# Patient Record
Sex: Male | Born: 1950 | Race: White | Hispanic: No | Marital: Married | Smoking: Never smoker
Health system: Southern US, Community
[De-identification: ages and names within clinical notes are randomized; demographics above are authoritative.]

## PROBLEM LIST (undated history)

## (undated) DIAGNOSIS — Z87442 Personal history of urinary calculi: Secondary | ICD-10-CM

## (undated) DIAGNOSIS — I4891 Unspecified atrial fibrillation: Secondary | ICD-10-CM

## (undated) DIAGNOSIS — T884XXA Failed or difficult intubation, initial encounter: Secondary | ICD-10-CM

## (undated) DIAGNOSIS — E785 Hyperlipidemia, unspecified: Secondary | ICD-10-CM

## (undated) DIAGNOSIS — K635 Polyp of colon: Secondary | ICD-10-CM

## (undated) DIAGNOSIS — I509 Heart failure, unspecified: Secondary | ICD-10-CM

## (undated) DIAGNOSIS — I442 Atrioventricular block, complete: Secondary | ICD-10-CM

## (undated) DIAGNOSIS — H269 Unspecified cataract: Secondary | ICD-10-CM

## (undated) DIAGNOSIS — I219 Acute myocardial infarction, unspecified: Secondary | ICD-10-CM

## (undated) DIAGNOSIS — M1990 Unspecified osteoarthritis, unspecified site: Secondary | ICD-10-CM

## (undated) DIAGNOSIS — I2510 Atherosclerotic heart disease of native coronary artery without angina pectoris: Secondary | ICD-10-CM

## (undated) DIAGNOSIS — I255 Ischemic cardiomyopathy: Secondary | ICD-10-CM

## (undated) DIAGNOSIS — I9789 Other postprocedural complications and disorders of the circulatory system, not elsewhere classified: Secondary | ICD-10-CM

## (undated) DIAGNOSIS — Z951 Presence of aortocoronary bypass graft: Secondary | ICD-10-CM

## (undated) DIAGNOSIS — I10 Essential (primary) hypertension: Secondary | ICD-10-CM

## (undated) DIAGNOSIS — L570 Actinic keratosis: Secondary | ICD-10-CM

## (undated) DIAGNOSIS — N200 Calculus of kidney: Secondary | ICD-10-CM

## (undated) HISTORY — PX: NECK SURGERY: SHX720

## (undated) HISTORY — PX: KNEE SURGERY: SHX244

## (undated) HISTORY — DX: Heart failure, unspecified: I50.9

## (undated) HISTORY — DX: Unspecified atrial fibrillation: I48.91

## (undated) HISTORY — DX: Essential (primary) hypertension: I10

## (undated) HISTORY — DX: Unspecified cataract: H26.9

## (undated) HISTORY — DX: Hyperlipidemia, unspecified: E78.5

## (undated) HISTORY — PX: EYE SURGERY: SHX253

## (undated) HISTORY — DX: Other postprocedural complications and disorders of the circulatory system, not elsewhere classified: I97.89

## (undated) HISTORY — PX: CATARACT EXTRACTION, BILATERAL: SHX1313

## (undated) HISTORY — DX: Ischemic cardiomyopathy: I25.5

## (undated) HISTORY — DX: Polyp of colon: K63.5

## (undated) HISTORY — DX: Atherosclerotic heart disease of native coronary artery without angina pectoris: I25.10

## (undated) HISTORY — DX: Atrioventricular block, complete: I44.2

## (undated) HISTORY — PX: HERNIA REPAIR: SHX51

## (undated) HISTORY — PX: JOINT REPLACEMENT: SHX530

## (undated) HISTORY — DX: Unspecified osteoarthritis, unspecified site: M19.90

## (undated) HISTORY — DX: Actinic keratosis: L57.0

---

## 2003-09-02 ENCOUNTER — Ambulatory Visit (HOSPITAL_COMMUNITY): Admission: RE | Admit: 2003-09-02 | Discharge: 2003-09-03 | Payer: Self-pay | Admitting: Neurological Surgery

## 2003-10-13 ENCOUNTER — Encounter: Admission: RE | Admit: 2003-10-13 | Discharge: 2003-10-13 | Payer: Self-pay | Admitting: Neurological Surgery

## 2011-01-13 ENCOUNTER — Ambulatory Visit: Payer: Self-pay | Admitting: Gastroenterology

## 2011-01-13 LAB — HM COLONOSCOPY

## 2011-01-18 LAB — PATHOLOGY REPORT

## 2012-04-25 DIAGNOSIS — R3915 Urgency of urination: Secondary | ICD-10-CM | POA: Insufficient documentation

## 2012-04-25 DIAGNOSIS — R361 Hematospermia: Secondary | ICD-10-CM | POA: Insufficient documentation

## 2012-04-25 DIAGNOSIS — R339 Retention of urine, unspecified: Secondary | ICD-10-CM | POA: Insufficient documentation

## 2012-04-25 DIAGNOSIS — R39198 Other difficulties with micturition: Secondary | ICD-10-CM | POA: Insufficient documentation

## 2012-04-25 DIAGNOSIS — N411 Chronic prostatitis: Secondary | ICD-10-CM | POA: Insufficient documentation

## 2012-04-25 DIAGNOSIS — N401 Enlarged prostate with lower urinary tract symptoms: Secondary | ICD-10-CM | POA: Insufficient documentation

## 2013-06-06 ENCOUNTER — Ambulatory Visit: Payer: Self-pay | Admitting: Family Medicine

## 2013-11-14 ENCOUNTER — Ambulatory Visit: Payer: Self-pay | Admitting: Family Medicine

## 2014-01-19 ENCOUNTER — Ambulatory Visit: Payer: Self-pay | Admitting: Specialist

## 2014-01-19 LAB — CBC WITH DIFFERENTIAL/PLATELET
BASOS PCT: 0.9 %
Basophil #: 0.1 10*3/uL (ref 0.0–0.1)
EOS ABS: 0.3 10*3/uL (ref 0.0–0.7)
EOS PCT: 3.6 %
HCT: 43.1 % (ref 40.0–52.0)
HGB: 14.6 g/dL (ref 13.0–18.0)
LYMPHS PCT: 20.4 %
Lymphocyte #: 1.8 10*3/uL (ref 1.0–3.6)
MCH: 27.6 pg (ref 26.0–34.0)
MCHC: 33.9 g/dL (ref 32.0–36.0)
MCV: 81 fL (ref 80–100)
MONOS PCT: 7.7 %
Monocyte #: 0.7 x10 3/mm (ref 0.2–1.0)
NEUTROS PCT: 67.4 %
Neutrophil #: 5.9 10*3/uL (ref 1.4–6.5)
PLATELETS: 211 10*3/uL (ref 150–440)
RBC: 5.29 10*6/uL (ref 4.40–5.90)
RDW: 14.2 % (ref 11.5–14.5)
WBC: 8.8 10*3/uL (ref 3.8–10.6)

## 2014-01-19 LAB — URINALYSIS, COMPLETE
BACTERIA: NONE SEEN
BILIRUBIN, UR: NEGATIVE
BLOOD: NEGATIVE
Glucose,UR: NEGATIVE mg/dL (ref 0–75)
KETONE: NEGATIVE
Leukocyte Esterase: NEGATIVE
Nitrite: NEGATIVE
Ph: 5 (ref 4.5–8.0)
Protein: NEGATIVE
RBC,UR: NONE SEEN /HPF (ref 0–5)
SPECIFIC GRAVITY: 1.024 (ref 1.003–1.030)

## 2014-01-19 LAB — BASIC METABOLIC PANEL
Anion Gap: 6 — ABNORMAL LOW (ref 7–16)
BUN: 15 mg/dL (ref 7–18)
CO2: 27 mmol/L (ref 21–32)
CREATININE: 0.85 mg/dL (ref 0.60–1.30)
Calcium, Total: 8.7 mg/dL (ref 8.5–10.1)
Chloride: 106 mmol/L (ref 98–107)
Glucose: 130 mg/dL — ABNORMAL HIGH (ref 65–99)
OSMOLALITY: 280 (ref 275–301)
POTASSIUM: 4.4 mmol/L (ref 3.5–5.1)
Sodium: 139 mmol/L (ref 136–145)

## 2014-01-19 LAB — MRSA PCR SCREENING

## 2014-01-19 LAB — APTT: ACTIVATED PTT: 27.6 s (ref 23.6–35.9)

## 2014-01-19 LAB — PROTIME-INR
INR: 0.9
Prothrombin Time: 12.4 secs (ref 11.5–14.7)

## 2014-02-04 ENCOUNTER — Inpatient Hospital Stay: Payer: Self-pay | Admitting: Specialist

## 2014-02-04 HISTORY — PX: OTHER SURGICAL HISTORY: SHX169

## 2014-02-05 LAB — CBC WITH DIFFERENTIAL/PLATELET
BASOS ABS: 0.1 10*3/uL (ref 0.0–0.1)
Basophil %: 0.6 %
Eosinophil #: 0 10*3/uL (ref 0.0–0.7)
Eosinophil %: 0.1 %
HCT: 37 % — ABNORMAL LOW (ref 40.0–52.0)
HGB: 12.3 g/dL — AB (ref 13.0–18.0)
LYMPHS ABS: 1.4 10*3/uL (ref 1.0–3.6)
Lymphocyte %: 5.8 %
MCH: 27.2 pg (ref 26.0–34.0)
MCHC: 33.2 g/dL (ref 32.0–36.0)
MCV: 82 fL (ref 80–100)
Monocyte #: 1.6 x10 3/mm — ABNORMAL HIGH (ref 0.2–1.0)
Monocyte %: 6.5 %
NEUTROS PCT: 87 %
Neutrophil #: 20.7 10*3/uL — ABNORMAL HIGH (ref 1.4–6.5)
Platelet: 195 10*3/uL (ref 150–440)
RBC: 4.51 10*6/uL (ref 4.40–5.90)
RDW: 14.5 % (ref 11.5–14.5)
WBC: 23.7 10*3/uL — AB (ref 3.8–10.6)

## 2014-02-05 LAB — BASIC METABOLIC PANEL
ANION GAP: 9 (ref 7–16)
BUN: 15 mg/dL (ref 7–18)
CHLORIDE: 101 mmol/L (ref 98–107)
Calcium, Total: 7.9 mg/dL — ABNORMAL LOW (ref 8.5–10.1)
Co2: 26 mmol/L (ref 21–32)
Creatinine: 1.06 mg/dL (ref 0.60–1.30)
EGFR (African American): >60
GLUCOSE: 136 mg/dL — AB (ref 65–99)
OSMOLALITY: 275 (ref 275–301)
POTASSIUM: 4.3 mmol/L (ref 3.5–5.1)
Sodium: 136 mmol/L (ref 136–145)

## 2014-02-06 LAB — HEMOGLOBIN: HGB: 11.8 g/dL — AB (ref 13.0–18.0)

## 2014-02-10 LAB — PATHOLOGY REPORT

## 2014-11-28 NOTE — Op Note (Signed)
PATIENT NAME:  [name], [name] MR#:  [id number] DATE OF BIRTH:  1951-01-24  DATE OF PROCEDURE:  02/04/2014  PREOPERATIVE DIAGNOSIS: Advanced osteoarthritis, right hip.   POSTOPERATIVE DIAGNOSIS:  Advanced osteoarthritis, right hip.  PROCEDURE PERFORMED: Cementless [place] AML right total hip replacement (13.5 mm femoral stem, 56 mm Porocoated series 300 cup, +4/10 mm build-up polyethylene liner, 36 mm head with a+ 1.5 mm neck length).   SURGEON: Park Breed, M.D.   ASSISTANT: Dr. Mack Guise.   ANESTHESIA: Spinal plus general endotracheal.   COMPLICATIONS:  None.  DRAINS: Two Autovacs.   ESTIMATED BLOOD LOSS: 300 mL.  REPLACED: None.   DESCRIPTION OF PROCEDURE: The patient was brought to the Operating Room where he underwent satisfactory general spinal anesthesia with narcotics and then general endotracheal anesthesia. He was turned in the left lateral decubitus position and padded appropriately. The right hip was prepped and draped in sterile fashion. A posterolateral incision was made and dissection carried out sharply through subcutaneous tissue. The fascia was incised. Electrocautery was used for hemostasis. The Charnley retractor was inserted. Soft tissues were released from the posterior aspect of the hip and the short external rotators were tagged. A pin was placed in the acetabulum, marked leg length, and then rotated out of the way. The capsule was incised and tagged. The leg was internally rotated and femoral head dislocated. The head was amputated about a fingerbreadth above the lesser trochanter with an oscillating saw. The head measured about 51 and 52 mm in size. The femoral canal was then opened with a Charnley canal finder, and the canal gradually reamed to 13 mm. He had thick hard bone and did not feel we needed to go any further than this. The femoral shaft was then retracted anteriorly and acetabulum exposed. The labrum was removed. The acetabulum was then sequentially  reamed starting about 48 mm and medializing. It was reamed up to 55 mm.  A 56 mm trial cup was inserted, was seated well, and had good orientation. This was removed and after irrigation, the 56 mm series 300 cup with 3 spikes was inserted and about 45 degrees of abduction and 25 degrees of anteversion. The trial liner was inserted with +4 build-up and 10 mm lip posterior superiorly. The femoral canal was then rasped up to 13.5 mm. The calcar rasp was used to smooth the calcar. Trial reduction was then carried out using a 36 mm head with +1.5 neck length. Leg lengths were excellent. The pin over the greater trochanter showed no change in length and clinically it was equal. The hip was dislocated. Trials were removed. The hole eliminator was placed in the acetabular cup and a polyethylene liner with a +4 polyethylene liner with 10 mm build-up was placed with the build-up posterior superiorly. The femoral broaches have been removed. The canal was irrigated. A 13.5 mm narrow stem was inserted and seated fully. The 36 mm head with a 1.5 neck length was attached and the hip was reduced and was quite stable in all directions. The leg lengths were excellent again. The acetabular pin was removed. After further irrigation, the capsule was closed with #2-0 Tycron. The short external rotators were closed with #2 Tycron. The 2 Autovacs were inserted. The deep fascia was then closed with #2 Quill over 1 drain, and the subcutaneous tissue was closed with 0 Quill over another drain. The skin was closed with staples. Dry sterile dressings were applied. The Autovac was activated. TENS pads were applied. The patient was  transferred onto his back and leg lengths were again equal and the hip was stable. He was transferred to his hospital bed and taken to recovery in good condition.    ____________________________ Park Breed, MD hem:ts D: 02/04/2014 10:48:29 ET T: 02/04/2014 11:31:06 ET JOB#: [id number]  cc: Park Breed,  MD, <Dictator> Park Breed MD ELECTRONICALLY SIGNED 02/04/2014 12:08

## 2014-11-28 NOTE — Discharge Summary (Signed)
PATIENT NAME:  [name], [name] MR#:  [id number] DATE OF BIRTH:  August 15, 1950  DATE OF ADMISSION:  02/04/2014 DATE OF DISCHARGE:  02/07/2014  PRIMARY CARE DOCTOR: Kirstie Peri. Caryn Section, MD  FINAL DIAGNOSES: 1. Advanced osteoarthritis, right hip.  2. High cholesterol.  OPERATIONS: On 02/04/2014: Cement-less AML to right total hip replacement.   COMPLICATIONS: None.   CONSULTATIONS: None.   DISCHARGE MEDICATIONS: Atorvastatin 20 mg daily, enteric-coated aspirin 1 p.o. b.i.d. for 6 weeks, gabapentin 400 mg b.i.d.,  Norco 7.5/325 mg every 6 hours p.r.n. pain.   HISTORY OF PRESENT ILLNESS: The patient is a 64 year old male with advanced osteoarthritis of the right hip. It gave him significant problems with work and home activities. 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   PAST MEDICAL HISTORY/ILLNESSES: High cholesterol.   PRIOR OPERATIONS: Lumbar fusion.    ALLERGIES: None.   REVIEW OF SYSTEMS: Unremarkable.   FAMILY HISTORY: Unremarkable.   SOCIAL HISTORY: The patient does not smoke or drink. He lives with his wife. He did test positive for MRSA preoperatively.   PHYSICAL EXAMINATION:  The patient is healthy in all other aspects.  The right hip showed decreased pain with motion; lacking 15 degrees of neutral and externally rotated another 15 degrees. He had no internal rotation. He flexed to 95 degrees. His leg lengths were equal. Neurovascular status was good distally.   LABORATORY DATA: On admission was satisfactory.   HOSPITAL COURSE: On 02/04/2014, the patient underwent cementless AML right total hip replacement. Postoperatively, he did well with minimal blood loss. Hemoglobin was 11.8 on his second postoperative day. He was ambulated and made good progress. He was discharged on 02/07/2014  with home physical therapy. He will be partial weightbearing on walker and return to my  office in 2 weeks for exam and x-ray.    ____________________________ Park Breed, MD hem:lm D: 02/07/2014 13:27:15 ET T: 02/08/2014 01:26:57 ET JOB#: [id number]  cc: Park Breed, MD, <Dictator> [place] Caryn Section, [place] MD ELECTRONICALLY SIGNED 02/08/2014 11:08

## 2014-11-28 NOTE — H&P (Signed)
   Subjective/Chief Complaint Right hip pain   History of Present Illness 64 year old male has advanced osteoarthritis of the right hip which is giving him significant problems with work and home activities.  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  Interferes with home activities and sleep.  Has taken meloxicam with minimal relief.  Has been evaluated for back pain as well but the hip is the big problem.  He wishes to proceed with total hip replacement.  Risks and benefits of surgery were discussed at length including but not limited to infection, non union, nerve or blood vessed damage, non union, need for repeat surgery, blood clots and lung emboli, and death.  X-rays show severe joint loss, sclerosis, and cyst formation.  Also has sacroiliac osteoarthritis and lumbar osteoarthritis.   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  ALLERGIES:  No Known Allergies:   HOME MEDICATIONS: Medication Instructions Status  meloxicam 15 mg oral tablet 1 tab(s) orally once a day Active  atorvastatin 20 mg oral tablet 1 tab(s) orally once a day (at bedtime) Active   Family and Social History:  Family History Non-Contributory   Social History negative tobacco, negative ETOH   Place of Living Home   Review of Systems:  Fever/Chills No   Cough No   Sputum No   Abdominal Pain No   Chest Pain No   Physical Exam:  GEN well developed, well nourished, no acute distress   HEENT pink conjunctivae   NECK supple   RESP normal resp effort   CARD regular rate   ABD denies tenderness   LYMPH negative neck   EXTR negative edema, Right hip range of motion severely decreased from -15 to 30*  external rotation, no internal rotation. flexion to 95*.  Leg lengths equal.  circulation/sensation/motor function good  distally.   SKIN normal to palpation   NEURO motor/sensory function intact   PSYCH alert, A+O to time, place, person    Assessment/Admission Diagnosis Advanced osteoarthritis right hip + MRSA scan   Plan Right total hip replacement with Vancomycin coverage.   Electronic Signatures: Park Breed (MD)  (Signed 30-Jun-15 14:09)  Authored: CHIEF COMPLAINT and HISTORY, PAST MEDICAL/SURGIAL HISTORY, ALLERGIES, HOME MEDICATIONS, FAMILY AND SOCIAL HISTORY, REVIEW OF SYSTEMS, PHYSICAL EXAM, ASSESSMENT AND PLAN   Last Updated: 30-Jun-15 14:09 by Park Breed (MD)

## 2014-12-31 LAB — LIPID PANEL
Cholesterol: 207 mg/dL — AB (ref 0–200)
HDL: 23 mg/dL — AB (ref 35–70)
LDL CALC: 104 mg/dL
Triglycerides: 400 mg/dL — AB (ref 40–160)

## 2014-12-31 LAB — BASIC METABOLIC PANEL
BUN: 11 mg/dL (ref 4–21)
Creatinine: 1.1 mg/dL (ref 0.6–1.3)
GLUCOSE: 119 mg/dL
Potassium: 4.5 mmol/L (ref 3.4–5.3)
Sodium: 144 mmol/L (ref 137–147)

## 2014-12-31 LAB — CBC AND DIFFERENTIAL
HCT: 47 % (ref 41–53)
Hemoglobin: 15.5 g/dL (ref 13.5–17.5)
Platelets: 191 10*3/uL (ref 150–399)
WBC: 6.8 10*3/mL

## 2014-12-31 LAB — TSH: TSH: 1.27 u[IU]/mL (ref 0.41–5.90)

## 2014-12-31 LAB — HEPATIC FUNCTION PANEL
ALT: 22 U/L (ref 10–40)
AST: 19 U/L (ref 14–40)

## 2015-01-01 ENCOUNTER — Emergency Department
Admission: EM | Admit: 2015-01-01 | Discharge: 2015-01-02 | Disposition: A | Discharge status: Home / Self Care | Payer: 59 | Attending: Emergency Medicine | Admitting: Emergency Medicine

## 2015-01-01 ENCOUNTER — Other Ambulatory Visit: Payer: Self-pay

## 2015-01-01 DIAGNOSIS — N200 Calculus of kidney: Secondary | ICD-10-CM | POA: Diagnosis not present

## 2015-01-01 DIAGNOSIS — R109 Unspecified abdominal pain: Secondary | ICD-10-CM | POA: Diagnosis present

## 2015-01-01 LAB — CBC WITH DIFFERENTIAL/PLATELET
Basophils Absolute: 0.1 10*3/uL (ref 0–0.1)
Basophils Relative: 1 %
EOS ABS: 0 10*3/uL (ref 0–0.7)
Eosinophils Relative: 0 %
HEMATOCRIT: 44.4 % (ref 40.0–52.0)
Hemoglobin: 15.3 g/dL (ref 13.0–18.0)
LYMPHS PCT: 12 %
Lymphs Abs: 1 10*3/uL (ref 1.0–3.6)
MCH: 26.9 pg (ref 26.0–34.0)
MCHC: 34.5 g/dL (ref 32.0–36.0)
MCV: 78 fL — ABNORMAL LOW (ref 80.0–100.0)
Monocytes Absolute: 0.7 10*3/uL (ref 0.2–1.0)
Monocytes Relative: 9 %
NEUTROS ABS: 6 10*3/uL (ref 1.4–6.5)
NEUTROS PCT: 78 %
PLATELETS: 144 10*3/uL — AB (ref 150–440)
RBC: 5.69 MIL/uL (ref 4.40–5.90)
RDW: 15.6 % — AB (ref 11.5–14.5)
WBC: 7.8 10*3/uL (ref 3.8–10.6)

## 2015-01-01 LAB — URINALYSIS COMPLETE WITH MICROSCOPIC (ARMC ONLY)
Bacteria, UA: NONE SEEN
Bilirubin Urine: NEGATIVE
GLUCOSE, UA: NEGATIVE mg/dL
KETONES UR: NEGATIVE mg/dL
Leukocytes, UA: NEGATIVE
NITRITE: NEGATIVE
PH: 6 (ref 5.0–8.0)
PROTEIN: NEGATIVE mg/dL
SQUAMOUS EPITHELIAL / LPF: NONE SEEN
Specific Gravity, Urine: 1.018 (ref 1.005–1.030)

## 2015-01-01 LAB — COMPREHENSIVE METABOLIC PANEL
ALBUMIN: 4 g/dL (ref 3.5–5.0)
ALT: 23 U/L (ref 17–63)
ANION GAP: 9 (ref 5–15)
AST: 27 U/L (ref 15–41)
Alkaline Phosphatase: 90 U/L (ref 38–126)
BUN: 12 mg/dL (ref 6–20)
CHLORIDE: 101 mmol/L (ref 101–111)
CO2: 22 mmol/L (ref 22–32)
CREATININE: 1.1 mg/dL (ref 0.61–1.24)
Calcium: 8.5 mg/dL — ABNORMAL LOW (ref 8.9–10.3)
GFR calc Af Amer: >60 mL/min (ref >60)
GFR calc non Af Amer: >60 mL/min (ref >60)
GLUCOSE: 169 mg/dL — AB (ref 65–99)
POTASSIUM: 3.6 mmol/L (ref 3.5–5.1)
SODIUM: 132 mmol/L — AB (ref 135–145)
TOTAL PROTEIN: 7.1 g/dL (ref 6.5–8.1)
Total Bilirubin: 1.5 mg/dL — ABNORMAL HIGH (ref 0.3–1.2)

## 2015-01-01 LAB — TROPONIN I

## 2015-01-01 MED ORDER — SODIUM CHLORIDE 0.9 % IV BOLUS (SEPSIS)
1000.0000 mL | Freq: Once | INTRAVENOUS | Status: AC
  Administered 2015-01-01: 1000 mL via INTRAVENOUS

## 2015-01-01 MED ORDER — ONDANSETRON HCL 4 MG/2ML IJ SOLN
INTRAMUSCULAR | Status: AC
  Administered 2015-01-01: 4 mg via INTRAVENOUS
  Filled 2015-01-01: qty 2

## 2015-01-01 MED ORDER — ONDANSETRON HCL 4 MG/2ML IJ SOLN
4.0000 mg | Freq: Once | INTRAMUSCULAR | Status: AC
  Administered 2015-01-01: 4 mg via INTRAVENOUS

## 2015-01-01 NOTE — ED Notes (Signed)
Pt presents to ER alert and in NAD. Pt states abd pain and back pain sicne last night. Pt reports n/v. Pt denies urinary s/s.

## 2015-01-02 ENCOUNTER — Emergency Department: Payer: 59

## 2015-01-02 DIAGNOSIS — N200 Calculus of kidney: Secondary | ICD-10-CM | POA: Diagnosis not present

## 2015-01-02 MED ORDER — ONDANSETRON HCL 4 MG PO TABS: 4.0000 mg | ORAL_TABLET | Freq: Three times a day (TID) | ORAL | Status: DC | PRN

## 2015-01-02 MED ORDER — TAMSULOSIN HCL 0.4 MG PO CAPS: 0.4000 mg | ORAL_CAPSULE | Freq: Every day | ORAL | Status: DC

## 2015-01-02 MED ORDER — CIPROFLOXACIN HCL 500 MG PO TABS: 500.0000 mg | ORAL_TABLET | Freq: Two times a day (BID) | ORAL | Status: DC

## 2015-01-02 MED ORDER — MORPHINE SULFATE 4 MG/ML IJ SOLN
4.0000 mg | Freq: Once | INTRAMUSCULAR | Status: AC
  Administered 2015-01-02: 4 mg via INTRAVENOUS

## 2015-01-02 MED ORDER — TAMSULOSIN HCL 0.4 MG PO CAPS: 0.4000 mg | ORAL_CAPSULE | Freq: Once | ORAL | Status: DC

## 2015-01-02 MED ORDER — ONDANSETRON HCL 4 MG/2ML IJ SOLN
INTRAMUSCULAR | Status: AC
  Filled 2015-01-02: qty 2

## 2015-01-02 MED ORDER — OXYCODONE-ACETAMINOPHEN 5-325 MG PO TABS: 1.0000 | ORAL_TABLET | Freq: Four times a day (QID) | ORAL | Status: DC | PRN

## 2015-01-02 MED ORDER — ONDANSETRON HCL 4 MG/2ML IJ SOLN
4.0000 mg | Freq: Once | INTRAMUSCULAR | Status: AC
  Administered 2015-01-02: 4 mg via INTRAVENOUS

## 2015-01-02 MED ORDER — MORPHINE SULFATE 4 MG/ML IJ SOLN
INTRAMUSCULAR | Status: AC
  Filled 2015-01-02: qty 1

## 2015-01-02 MED ORDER — SODIUM CHLORIDE 0.9 % IV BOLUS (SEPSIS)
1000.0000 mL | Freq: Once | INTRAVENOUS | Status: AC
  Administered 2015-01-02: 1000 mL via INTRAVENOUS

## 2015-01-02 NOTE — Discharge Instructions (Signed)
Kidney Stones Take her CAT scan report with you to your follow-up appointment with either your primary care doctor or Dr. Saralyn Pilar.  You were found to have calcifications in the coronary arteries.  Use the urine strainer to try to catch any kidney stones. If you're able to catch any stones take them with you to your follow-up appointment with the urologist. Kidney stones (urolithiasis) are solid masses that form inside your kidneys. The intense pain is caused by the stone moving through the kidney, ureter, bladder, and urethra (urinary tract). When the stone moves, the ureter starts to spasm around the stone. The stone is usually passed in your pee (urine).  HOME CARE  Drink enough fluids to keep your pee clear or pale yellow. This helps to get the stone out.  Strain all pee through the provided strainer. Do not pee without peeing through the strainer, not even once. If you pee the stone out, catch it in the strainer. The stone may be as small as a grain of salt. Take this to your doctor. This will help your doctor figure out what you can do to try to prevent more kidney stones.  Only take medicine as told by your doctor.  Follow up with your doctor as told.  Get follow-up X-rays as told by your doctor. GET HELP IF: You have pain that gets worse even if you have been taking pain medicine. GET HELP RIGHT AWAY IF:   Your pain does not get better with medicine.  You have a fever or shaking chills.  Your pain increases and gets worse over 18 hours.  You have new belly (abdominal) pain.  You feel faint or pass out.  You are unable to pee. MAKE SURE YOU:   Understand these instructions.  Will watch your condition.  Will get help right away if you are not doing well or get worse. Document Released: 01/10/2008 Document Revised: 03/26/2013 Document Reviewed: 12/25/2012 [facility] Patient Information 2015 [place], [place]. This information is not intended to replace advice given to you by  your health care provider. Make sure you discuss any questions you have with your health care provider.

## 2015-01-02 NOTE — ED Notes (Signed)
Patient transported to CT 

## 2015-01-02 NOTE — ED Provider Notes (Signed)
[facility] Emergency Department Provider Note  ____________________________________________  Time seen: Approximately 12:11 AM  I have reviewed the triage vital signs and the nursing notes.   HISTORY  Chief Complaint Abdominal Pain    HPI ANTWIONE PICOTTE is a 64 y.o. male without any chronic medical issues who presents with 1 day of left lower lumbar pain. Patient said the pain started last night suddenly and is in and out of 10. It is a dull ache but he just doesn't seem to be able to get comfortable. He says he also had blood in his urine about one week ago. He has no history of kidney stones and no history to his family. He has no ongoing medical issues. He does not have any dysuria at this time. He has had several episodes of nausea and vomiting. However, no blood in his vomitus. No diarrhea. The pain does not radiate. He has had no loss of bowel or bladder continence. He is not having any abdominal pain at this time.   No past medical history on file.  There are no active problems to display for this patient.   No past surgical history on file.  No current outpatient prescriptions on file.  Allergies Review of patient's allergies indicates no known allergies.  No family history on file.  Social History History  Substance Use Topics  . Smoking status: Not on file  . Smokeless tobacco: Not on file  . Alcohol Use: Not on file    Review of Systems Constitutional: No fever/chills Eyes: No visual changes. ENT: No sore throat. Cardiovascular: Denies chest pain. Respiratory: Denies shortness of breath. Gastrointestinal: No abdominal pain.  No nausea, no vomiting.  No diarrhea.  No constipation. Genitourinary: Negative for dysuria. Musculoskeletal: Left lumbar pain. Skin: Negative for rash. Neurological: Negative for headaches, focal weakness or numbness.  10-point ROS otherwise  negative.  ____________________________________________   PHYSICAL EXAM:  VITAL SIGNS: ED Triage Vitals  Enc Vitals Group     BP 01/01/15 1953 181/93 mmHg     Pulse Rate 01/01/15 1953 79     Resp 01/01/15 1953 18     Temp 01/01/15 1953 98.1 F (36.7 C)     Temp Source 01/01/15 1953 Oral     SpO2 01/01/15 1953 97 %     Weight 01/01/15 1953 204 lb (92.534 kg)     Height 01/01/15 1953 5\' 6"  (1.676 m)     Head Cir --      Peak Flow --      Pain Score 01/01/15 1954 8     Pain Loc --      Pain Edu? --      Excl. in [place]? --     Constitutional: Alert and oriented. Appears in mild discomfort. Eyes: Conjunctivae are normal. PERRL. EOMI. Head: Atraumatic. Nose: No congestion/rhinnorhea. Mouth/Throat: Mucous membranes are moist.  Oropharynx non-erythematous. Neck: No stridor.   Cardiovascular: Normal rate, regular rhythm. Grossly normal heart sounds.  Good peripheral circulation. Respiratory: Normal respiratory effort.  No retractions. Lungs CTAB. Gastrointestinal: Soft and nontender. No distention. No abdominal bruits. No CVA tenderness. Musculoskeletal: No lower extremity tenderness nor edema.  No joint effusions. Neurologic:  Normal speech and language. No gross focal neurologic deficits are appreciated. Speech is normal. No gait instability. Skin:  Skin is warm, dry and intact. No rash noted. Psychiatric: Mood and affect are normal. Speech and behavior are normal.  ____________________________________________   LABS (all labs ordered are listed, but only abnormal  results are displayed)  Labs Reviewed  COMPREHENSIVE METABOLIC PANEL - Abnormal; Notable for the following:    Sodium 132 (*)    Glucose, Bld 169 (*)    Calcium 8.5 (*)    Total Bilirubin 1.5 (*)    All other components within normal limits  URINALYSIS COMPLETEWITH MICROSCOPIC ([facility] ONLY) - Abnormal; Notable for the following:    Color, Urine YELLOW (*)    APPearance CLEAR (*)    Hgb urine dipstick 3+ (*)     All other components within normal limits  CBC WITH DIFFERENTIAL/PLATELET - Abnormal; Notable for the following:    MCV 78.0 (*)    RDW 15.6 (*)    Platelets 144 (*)    All other components within normal limits  TROPONIN I   ____________________________________________  EKG  ED ECG REPORT I, Doran Stabler, the attending physician, personally viewed and interpreted this ECG.   Date: 01/02/2015  EKG Time: 2003  Rate: 72  Rhythm: Normal sinus rhythm with first-degree AV block.  Axis: Normal axis  Intervals:none  ST&T Change: No ST elevations or depressions. No STEMI. T wave inversions in 1 and aVL. No previous for comparison.  ____________________________________________  RADIOLOGY  3 mm left ureteral stone at the pelvic brim with mild hydronephrosis. Also with extensive coronary atherosclerosis. Also with mild perinephric stranding on the left. ____________________________________________   PROCEDURES   ____________________________________________   INITIAL IMPRESSION / ASSESSMENT AND PLAN / ED COURSE  Pertinent labs & imaging results that were available during my care of the patient were reviewed by me and considered in my medical decision making (see chart for details).  Suspect kidney stone. We'll order CAT scan of the abdomen and pelvis without contrast.  ----------------------------------------- 1:23 AM on 01/02/2015 -----------------------------------------  Patient's pain is 2 out of 10 after IV pain meds. We'll discharge home with Flomax and Percocet as well as a urine strainer. Given copy of CT report. We'll follow up with either primary care doctor Dr. Tomasita Crumble shows, which is why she is, for the atherosclerosis. The patient does not have any chest pain or shortness of breath this time. This represents an incidental finding but will need further investigation. ____________________________________________   FINAL CLINICAL IMPRESSION(S) / ED  DIAGNOSES  Left back and flank pain. Left-sided ureteral stone. Hematuria. Acute common initial visit.    Orbie Pyo, MD 01/02/15 0130

## 2015-01-03 ENCOUNTER — Encounter: Payer: Self-pay | Admitting: Emergency Medicine

## 2015-01-03 ENCOUNTER — Emergency Department: Payer: 59

## 2015-01-03 ENCOUNTER — Other Ambulatory Visit: Payer: Self-pay

## 2015-01-03 ENCOUNTER — Emergency Department
Admission: EM | Admit: 2015-01-03 | Discharge: 2015-01-03 | Disposition: A | Discharge status: Home / Self Care | Payer: 59 | Attending: Student | Admitting: Student

## 2015-01-03 DIAGNOSIS — Z79899 Other long term (current) drug therapy: Secondary | ICD-10-CM | POA: Diagnosis not present

## 2015-01-03 DIAGNOSIS — Z87442 Personal history of urinary calculi: Secondary | ICD-10-CM | POA: Diagnosis not present

## 2015-01-03 DIAGNOSIS — N201 Calculus of ureter: Secondary | ICD-10-CM

## 2015-01-03 DIAGNOSIS — Z792 Long term (current) use of antibiotics: Secondary | ICD-10-CM | POA: Diagnosis not present

## 2015-01-03 DIAGNOSIS — R52 Pain, unspecified: Secondary | ICD-10-CM

## 2015-01-03 DIAGNOSIS — R112 Nausea with vomiting, unspecified: Secondary | ICD-10-CM

## 2015-01-03 DIAGNOSIS — R109 Unspecified abdominal pain: Secondary | ICD-10-CM | POA: Diagnosis present

## 2015-01-03 HISTORY — DX: Calculus of kidney: N20.0

## 2015-01-03 LAB — CBC WITH DIFFERENTIAL/PLATELET
Basophils Absolute: 0 10*3/uL (ref 0–0.1)
Basophils Relative: 0 %
EOS ABS: 0 10*3/uL (ref 0–0.7)
EOS PCT: 0 %
HEMATOCRIT: 42.3 % (ref 40.0–52.0)
HEMOGLOBIN: 14.4 g/dL (ref 13.0–18.0)
LYMPHS PCT: 7 %
Lymphs Abs: 0.5 10*3/uL — ABNORMAL LOW (ref 1.0–3.6)
MCH: 26.5 pg (ref 26.0–34.0)
MCHC: 34 g/dL (ref 32.0–36.0)
MCV: 78.1 fL — AB (ref 80.0–100.0)
Monocytes Absolute: 0.6 10*3/uL (ref 0.2–1.0)
Monocytes Relative: 10 %
Neutro Abs: 5.4 10*3/uL (ref 1.4–6.5)
Neutrophils Relative %: 83 %
Platelets: 117 10*3/uL — ABNORMAL LOW (ref 150–440)
RBC: 5.42 MIL/uL (ref 4.40–5.90)
RDW: 15.4 % — ABNORMAL HIGH (ref 11.5–14.5)
WBC: 6.5 10*3/uL (ref 3.8–10.6)

## 2015-01-03 LAB — URINALYSIS COMPLETE WITH MICROSCOPIC (ARMC ONLY)
Bacteria, UA: NONE SEEN
Bilirubin Urine: NEGATIVE
Glucose, UA: NEGATIVE mg/dL
Ketones, ur: NEGATIVE mg/dL
LEUKOCYTES UA: NEGATIVE
NITRITE: NEGATIVE
Protein, ur: 30 mg/dL — AB
SPECIFIC GRAVITY, URINE: 1.026 (ref 1.005–1.030)
Squamous Epithelial / LPF: NONE SEEN
pH: 5 (ref 5.0–8.0)

## 2015-01-03 LAB — COMPREHENSIVE METABOLIC PANEL
ALT: 27 U/L (ref 17–63)
ANION GAP: 10 (ref 5–15)
AST: 31 U/L (ref 15–41)
Albumin: 3.6 g/dL (ref 3.5–5.0)
Alkaline Phosphatase: 76 U/L (ref 38–126)
BUN: 18 mg/dL (ref 6–20)
CHLORIDE: 98 mmol/L — AB (ref 101–111)
CO2: 25 mmol/L (ref 22–32)
Calcium: 8.4 mg/dL — ABNORMAL LOW (ref 8.9–10.3)
Creatinine, Ser: 1.78 mg/dL — ABNORMAL HIGH (ref 0.61–1.24)
GFR calc Af Amer: 44 mL/min — ABNORMAL LOW (ref >60)
GFR calc non Af Amer: 38 mL/min — ABNORMAL LOW (ref >60)
GLUCOSE: 160 mg/dL — AB (ref 65–99)
POTASSIUM: 3.8 mmol/L (ref 3.5–5.1)
SODIUM: 133 mmol/L — AB (ref 135–145)
TOTAL PROTEIN: 6.7 g/dL (ref 6.5–8.1)
Total Bilirubin: 1.7 mg/dL — ABNORMAL HIGH (ref 0.3–1.2)

## 2015-01-03 LAB — TROPONIN I: Troponin I: <0.03 ng/mL (ref <0.031)

## 2015-01-03 MED ORDER — ONDANSETRON HCL 4 MG/2ML IJ SOLN
INTRAMUSCULAR | Status: AC
  Administered 2015-01-03: 4 mg via INTRAVENOUS
  Filled 2015-01-03: qty 2

## 2015-01-03 MED ORDER — HYDROMORPHONE HCL 1 MG/ML IJ SOLN
INTRAMUSCULAR | Status: AC
  Administered 2015-01-03: 1 mg via INTRAVENOUS
  Filled 2015-01-03: qty 1

## 2015-01-03 MED ORDER — OXYCODONE-ACETAMINOPHEN 5-325 MG PO TABS: 1.0000 | ORAL_TABLET | Freq: Four times a day (QID) | ORAL | Status: DC | PRN

## 2015-01-03 MED ORDER — ONDANSETRON 4 MG PO TBDP: 4.0000 mg | ORAL_TABLET | Freq: Three times a day (TID) | ORAL | Status: DC | PRN

## 2015-01-03 MED ORDER — SODIUM CHLORIDE 0.9 % IV BOLUS (SEPSIS)
1000.0000 mL | Freq: Once | INTRAVENOUS | Status: AC
  Administered 2015-01-03: 1000 mL via INTRAVENOUS

## 2015-01-03 MED ORDER — ONDANSETRON HCL 4 MG/2ML IJ SOLN
4.0000 mg | Freq: Once | INTRAMUSCULAR | Status: AC
  Administered 2015-01-03: 4 mg via INTRAVENOUS

## 2015-01-03 MED ORDER — HYDROMORPHONE HCL 1 MG/ML IJ SOLN
1.0000 mg | Freq: Once | INTRAMUSCULAR | Status: AC
  Administered 2015-01-03: 1 mg via INTRAVENOUS

## 2015-01-03 NOTE — ED Provider Notes (Signed)
[facility] Emergency Department Provider Note  ____________________________________________  Time seen: Approximately 9:41 AM  I have reviewed the triage vital signs and the nursing notes.   HISTORY  Chief Complaint Flank Pain    HPI [name] is a 64 y.o. male no chronic medical problems presents for evaluation of continued left flank pain , ongoing for the past 2-3 days. The patient was seen here on the night of 01/01/2015 for similar pain, had noncon CT of the abdomen and pelvis at that time which revealed several left-sided kidney stones including a 3 mm stone at the pelvic brim. He was discharge with expectant management. He reports continued pain and vomiting despite compliance with medications. He believes he may have passed a stone in his urine. He denies fevers. He denies chest pain or difficulty breathing. No modifying factors. He describes the pain as constant and dull.   Past Medical History  Diagnosis Date  . Kidney stones   . Kidney stones     There are no active problems to display for this patient.   Past Surgical History  Procedure Laterality Date  . Neck surgery    . Hernia repair    . Right hip replacement      Current Outpatient Rx  Name  Route  Sig  Dispense  Refill  . ciprofloxacin (CIPRO) 500 MG tablet   Oral   Take 1 tablet (500 mg total) by mouth 2 (two) times daily.   6 tablet   0   . ondansetron (ZOFRAN) 4 MG tablet   Oral   Take 1 tablet (4 mg total) by mouth every 8 (eight) hours as needed for nausea or vomiting.   10 tablet   1   . oxyCODONE-acetaminophen (ROXICET) 5-325 MG per tablet   Oral   Take 1-2 tablets by mouth every 6 (six) hours as needed.   15 tablet   0   . tamsulosin (FLOMAX) 0.4 MG CAPS capsule   Oral   Take 1 capsule (0.4 mg total) by mouth daily.   30 capsule   0     Allergies Review of patient's allergies indicates no known allergies.  No family history on file.  Social  History History  Substance Use Topics  . Smoking status: Never Smoker   . Smokeless tobacco: Not on file  . Alcohol Use: No    Review of Systems Constitutional: No fever/chills Eyes: No visual changes. ENT: No sore throat. Cardiovascular: Denies chest pain. Respiratory: Denies shortness of breath. Gastrointestinal: No abdominal pain.  + nausea, + vomiting.  No diarrhea.  No constipation. Genitourinary: Positive for hematuria. Musculoskeletal: Positive for left flank pain. Skin: Negative for rash. Neurological: Negative for headaches, focal weakness or numbness.  10-point ROS otherwise negative.  ____________________________________________   PHYSICAL EXAM:  VITAL SIGNS: ED Triage Vitals  Enc Vitals Group     BP 01/03/15 0929 218/93 mmHg     Pulse Rate 01/03/15 0929 67     Resp 01/03/15 0929 22     Temp 01/03/15 0929 97.6 F (36.4 C)     Temp Source 01/03/15 0929 Oral     SpO2 01/03/15 0929 100 %     Weight 01/03/15 0929 200 lb (90.719 kg)     Height 01/03/15 0929 5\' 6"  (1.676 m)     Head Cir --      Peak Flow --      Pain Score 01/03/15 0932 10     Pain Loc --  Pain Edu? --      Excl. in [place]? --     Constitutional: Alert and oriented. In mild distress due to pain. Eyes: Conjunctivae are normal. PERRL. EOMI. Head: Atraumatic. Nose: No congestion/rhinnorhea. Mouth/Throat: Mucous membranes are moist.  Oropharynx non-erythematous. Neck: No stridor.   Cardiovascular: Normal rate, regular rhythm. Grossly normal heart sounds.  Good peripheral circulation. Respiratory: Normal respiratory effort.  No retractions. Lungs CTAB. Gastrointestinal: Soft and nontender. No distention. No abdominal bruits. No CVA tenderness. Genitourinary: deferred Musculoskeletal: No lower extremity tenderness nor edema.  No joint effusions. Neurologic:  Normal speech and language. No gross focal neurologic deficits are appreciated. Speech is normal. No gait instability. Skin:  Skin is  warm, dry and intact. No rash noted. Psychiatric: Mood and affect are normal. Speech and behavior are normal.  ____________________________________________   LABS (all labs ordered are listed, but only abnormal results are displayed)  Labs Reviewed  CBC WITH DIFFERENTIAL/PLATELET - Abnormal; Notable for the following:    MCV 78.1 (*)    RDW 15.4 (*)    Platelets 117 (*)    Lymphs Abs 0.5 (*)    All other components within normal limits  COMPREHENSIVE METABOLIC PANEL - Abnormal; Notable for the following:    Sodium 133 (*)    Chloride 98 (*)    Glucose, Bld 160 (*)    Creatinine, Ser 1.78 (*)    Calcium 8.4 (*)    Total Bilirubin 1.7 (*)    GFR calc non Af Amer 38 (*)    GFR calc Af Amer 44 (*)    All other components within normal limits  URINALYSIS COMPLETEWITH MICROSCOPIC ([facility] ONLY) - Abnormal; Notable for the following:    Color, Urine YELLOW (*)    APPearance CLEAR (*)    Hgb urine dipstick 1+ (*)    Protein, ur 30 (*)    All other components within normal limits  TROPONIN I   ____________________________________________  EKG  ED ECG REPORT I, Joanne Gavel, the attending physician, personally viewed and interpreted this ECG.   Date: 01/03/2015  EKG Time: 11:15  Rate: 68  Rhythm: normal sinus rhythm  Axis: Normal axis  Intervals:first-degree A-V block   ST&T Change: No acute ST segment change on EKG unchanged from 01/02/2015  ____________________________________________  RADIOLOGY  UA renal: IMPRESSION: 1. Mild left hydronephrosis. Patient has a left ureteral stone on the previous CT scan, which is the presumed cause of this hydronephrosis. The intrarenal stone noted in the left kidney on the prior CT scan is not resolved sonographically. 2. 2.8 cm right renal cyst. 3. No other abnormalities. ____________________________________________   PROCEDURES  Procedure(s) performed: None  Critical Care performed:  No  ____________________________________________   INITIAL IMPRESSION / ASSESSMENT AND PLAN / ED COURSE  Pertinent labs & imaging results that were available during my care of the patient were reviewed by me and considered in my medical decision making (see chart for details).  [name] is a 64 y.o. male no chronic medical problems presents for evaluation of continued left flank pain , ongoing for the past 2-3 days.  CT of the abdomen and pelvis during his most recent visit was consistent with left nephro and ureterolithiasis. He is currently hypertensive which I suspect is secondary to pain. We'll treat his pain and continue to monitor. We'll obtain screening EKG, labs, urinalysis, renal ultrasound. We'll provide analgesics, antiemetics, IV fluids.   ----------------------------------------- 12:43 PM on 01/03/2015 -----------------------------------------  Patient reports his pain has  significantly improved, he has tolerated oral intake. His blood pressure has improved though he remains somewhat hypertensive with a systolic blood pressure of 168/83. Clinical picture is most insistent with continued small left ureterolithiasis. Creatinine is 1.78 today, was 1.1 during his previous visit, normal saline given. Discussed the case with the on-call urologist, Dr.Manny, agrees that as the patient's pain is currently well controlled, he is tolerating by mouth intake, he does not appear toxic and his stone is small, he may be discharged with expedient urology follow up. I discussed this with the patient and he will call in 2 days for an appointment. I discussed meticulous return precautions with the patient and he and his wife are comfortable with discharge home. UA does not appear infected.  Additionally, I spoke with the radiologist regarding his noncontrasted CT scan on 528/2016 and the contours of his aorta appear benign. Troponin is negative. EKG is unchanged from prior, and he will follow up  with Dr. Saralyn Pilar as he was previously instructed to do by Dr. Dineen Kid. ____________________________________________   FINAL CLINICAL IMPRESSION(S) / ED DIAGNOSES  Final diagnoses:  Pain  Ureterolithiasis  Non-intractable vomiting with nausea, vomiting of unspecified type      Joanne Gavel, MD 01/03/15 1248

## 2015-01-03 NOTE — ED Notes (Signed)
Pt reports left flank pain that started Thursday. Pt was seen here in the hospital and diagnosed with kidney stones. Denies fever

## 2015-01-03 NOTE — Discharge Instructions (Signed)
Return immediately for severe or worsening pain, fevers, recurrent vomiting, chest pain, abdominal pain, difficulty breathing, or for any other concerns. See your primary care doctor for recheck of your blood pressure in 3-5 days as you blood pressure was a bit elevated during your visit today.

## 2015-01-03 NOTE — ED Notes (Signed)
NAD noted at time of D/C. Pt denies questions or concerns. Pt taken to the lobby at this time via wheelchair.   

## 2015-01-12 DIAGNOSIS — N201 Calculus of ureter: Secondary | ICD-10-CM | POA: Diagnosis not present

## 2015-01-12 DIAGNOSIS — N411 Chronic prostatitis: Secondary | ICD-10-CM | POA: Diagnosis not present

## 2015-01-12 DIAGNOSIS — N401 Enlarged prostate with lower urinary tract symptoms: Secondary | ICD-10-CM | POA: Diagnosis not present

## 2015-01-12 DIAGNOSIS — N200 Calculus of kidney: Secondary | ICD-10-CM | POA: Diagnosis not present

## 2015-01-12 HISTORY — DX: Calculus of ureter: N20.1

## 2015-01-14 DIAGNOSIS — N289 Disorder of kidney and ureter, unspecified: Secondary | ICD-10-CM | POA: Diagnosis not present

## 2015-01-14 DIAGNOSIS — R0602 Shortness of breath: Secondary | ICD-10-CM | POA: Diagnosis not present

## 2015-01-14 DIAGNOSIS — I2510 Atherosclerotic heart disease of native coronary artery without angina pectoris: Secondary | ICD-10-CM | POA: Diagnosis not present

## 2015-01-14 DIAGNOSIS — I208 Other forms of angina pectoris: Secondary | ICD-10-CM | POA: Diagnosis not present

## 2015-01-29 DIAGNOSIS — R0602 Shortness of breath: Secondary | ICD-10-CM | POA: Diagnosis not present

## 2015-01-29 DIAGNOSIS — I2510 Atherosclerotic heart disease of native coronary artery without angina pectoris: Secondary | ICD-10-CM | POA: Diagnosis not present

## 2015-02-05 DIAGNOSIS — E784 Other hyperlipidemia: Secondary | ICD-10-CM | POA: Diagnosis not present

## 2015-02-05 DIAGNOSIS — I2510 Atherosclerotic heart disease of native coronary artery without angina pectoris: Secondary | ICD-10-CM | POA: Diagnosis not present

## 2015-02-05 DIAGNOSIS — E669 Obesity, unspecified: Secondary | ICD-10-CM | POA: Diagnosis not present

## 2015-02-05 DIAGNOSIS — N182 Chronic kidney disease, stage 2 (mild): Secondary | ICD-10-CM | POA: Diagnosis not present

## 2015-02-06 ENCOUNTER — Other Ambulatory Visit: Payer: Self-pay

## 2015-02-06 ENCOUNTER — Encounter: Payer: Self-pay | Admitting: Emergency Medicine

## 2015-02-06 ENCOUNTER — Emergency Department
Admission: EM | Admit: 2015-02-06 | Discharge: 2015-02-06 | Disposition: A | Discharge status: Home / Self Care | Payer: 59 | Attending: Emergency Medicine | Admitting: Emergency Medicine

## 2015-02-06 DIAGNOSIS — Z792 Long term (current) use of antibiotics: Secondary | ICD-10-CM | POA: Insufficient documentation

## 2015-02-06 DIAGNOSIS — R1010 Upper abdominal pain, unspecified: Secondary | ICD-10-CM | POA: Diagnosis present

## 2015-02-06 DIAGNOSIS — Z79899 Other long term (current) drug therapy: Secondary | ICD-10-CM | POA: Insufficient documentation

## 2015-02-06 DIAGNOSIS — R1013 Epigastric pain: Secondary | ICD-10-CM | POA: Insufficient documentation

## 2015-02-06 LAB — URINALYSIS COMPLETE WITH MICROSCOPIC (ARMC ONLY)
Bacteria, UA: NONE SEEN
Bilirubin Urine: NEGATIVE
Glucose, UA: NEGATIVE mg/dL
Hgb urine dipstick: NEGATIVE
Ketones, ur: NEGATIVE mg/dL
Leukocytes, UA: NEGATIVE
Nitrite: NEGATIVE
PH: 6 (ref 5.0–8.0)
Protein, ur: 30 mg/dL — AB
SPECIFIC GRAVITY, URINE: 1.021 (ref 1.005–1.030)

## 2015-02-06 LAB — COMPREHENSIVE METABOLIC PANEL
ALBUMIN: 4.1 g/dL (ref 3.5–5.0)
ALT: 26 U/L (ref 17–63)
AST: 28 U/L (ref 15–41)
Alkaline Phosphatase: 86 U/L (ref 38–126)
Anion gap: 9 (ref 5–15)
BUN: 13 mg/dL (ref 6–20)
CALCIUM: 8.8 mg/dL — AB (ref 8.9–10.3)
CHLORIDE: 103 mmol/L (ref 101–111)
CO2: 26 mmol/L (ref 22–32)
Creatinine, Ser: 0.82 mg/dL (ref 0.61–1.24)
GFR calc non Af Amer: >60 mL/min (ref >60)
Glucose, Bld: 135 mg/dL — ABNORMAL HIGH (ref 65–99)
Potassium: 4 mmol/L (ref 3.5–5.1)
Sodium: 138 mmol/L (ref 135–145)
Total Bilirubin: 1.1 mg/dL (ref 0.3–1.2)
Total Protein: 7.3 g/dL (ref 6.5–8.1)

## 2015-02-06 LAB — CBC WITH DIFFERENTIAL/PLATELET
BASOS ABS: 0.3 10*3/uL — AB (ref 0–0.1)
Basophils Relative: 2 %
Eosinophils Absolute: 0 10*3/uL (ref 0–0.7)
Eosinophils Relative: 0 %
HEMATOCRIT: 42.4 % (ref 40.0–52.0)
HEMOGLOBIN: 14 g/dL (ref 13.0–18.0)
LYMPHS PCT: 5 %
Lymphs Abs: 0.8 10*3/uL — ABNORMAL LOW (ref 1.0–3.6)
MCH: 26 pg (ref 26.0–34.0)
MCHC: 33 g/dL (ref 32.0–36.0)
MCV: 78.8 fL — ABNORMAL LOW (ref 80.0–100.0)
Monocytes Absolute: 0.7 10*3/uL (ref 0.2–1.0)
Monocytes Relative: 4 %
Neutro Abs: 15 10*3/uL — ABNORMAL HIGH (ref 1.4–6.5)
Neutrophils Relative %: 89 %
Platelets: 244 10*3/uL (ref 150–440)
RBC: 5.38 MIL/uL (ref 4.40–5.90)
RDW: 15.9 % — ABNORMAL HIGH (ref 11.5–14.5)
WBC: 16.9 10*3/uL — ABNORMAL HIGH (ref 3.8–10.6)

## 2015-02-06 LAB — LIPASE, BLOOD: LIPASE: 46 U/L (ref 22–51)

## 2015-02-06 LAB — TROPONIN I: Troponin I: <0.03 ng/mL (ref <0.031)

## 2015-02-06 MED ORDER — ONDANSETRON HCL 4 MG/2ML IJ SOLN
INTRAMUSCULAR | Status: AC
  Administered 2015-02-06: 4 mg via INTRAVENOUS
  Filled 2015-02-06: qty 2

## 2015-02-06 MED ORDER — MORPHINE SULFATE 2 MG/ML IJ SOLN
INTRAMUSCULAR | Status: AC
  Filled 2015-02-06: qty 2

## 2015-02-06 MED ORDER — ONDANSETRON HCL 4 MG/2ML IJ SOLN
4.0000 mg | Freq: Once | INTRAMUSCULAR | Status: AC
  Administered 2015-02-06: 4 mg via INTRAVENOUS

## 2015-02-06 MED ORDER — MORPHINE SULFATE 4 MG/ML IJ SOLN
4.0000 mg | Freq: Once | INTRAMUSCULAR | Status: AC
  Administered 2015-02-06: 4 mg via INTRAVENOUS

## 2015-02-06 NOTE — ED Notes (Signed)
Began epigastric pain at 3 am, nausea with vomiting x 1, decreased appetite, denies fevers.

## 2015-02-06 NOTE — Discharge Instructions (Signed)
No certain cause was found for your abdominal pain episode. Given that your symptoms are all better now, we discussed not doing any additional imaging at this point time. I suspect either spasm, or partial obstruction due to scarring which is now relieved. We also discussed the possibility of it being related to kidney stone, however the symptoms seem much less likely to be due to a kidney stone given the swelling of the stomach, and location.  Return to the emergency department for any new or worsening abdominal pain, fever, black or bloody stools, or vomiting.   Abdominal Pain Many things can cause belly (abdominal) pain. Most times, the belly pain is not dangerous. Many cases of belly pain can be watched and treated at home. HOME CARE   Do not take medicines that help you go poop (laxatives) unless told to by your doctor.  Only take medicine as told by your doctor.  Eat or drink as told by your doctor. Your doctor will tell you if you should be on a special diet. GET HELP IF:  You do not know what is causing your belly pain.  You have belly pain while you are sick to your stomach (nauseous) or have runny poop (diarrhea).  You have pain while you pee or poop.  Your belly pain wakes you up at night.  You have belly pain that gets worse or better when you eat.  You have belly pain that gets worse when you eat fatty foods.  You have a fever. GET HELP RIGHT AWAY IF:   The pain does not go away within 2 hours.  You keep throwing up (vomiting).  The pain changes and is only in the right or left part of the belly.  You have bloody or tarry looking poop. MAKE SURE YOU:   Understand these instructions.  Will watch your condition.  Will get help right away if you are not doing well or get worse. Document Released: 01/10/2008 Document Revised: 07/29/2013 Document Reviewed: 04/02/2013 [facility] Patient Information 2015 [place], [place]. This information is not intended to replace  advice given to you by your health care provider. Make sure you discuss any questions you have with your health care provider.

## 2015-02-06 NOTE — ED Provider Notes (Signed)
[facility] Emergency Department Provider Note   ____________________________________________  Time seen: 7 PM I have reviewed the triage vital signs and the triage nursing note.  HISTORY  Chief Complaint Abdominal Pain   Historian Patient and spouse  HPI [name] is a 64 y.o. male who came in for evaluation of upper abdominal pain which was moderate and just simply described as pain and so with swelling which woke him up around 3 AM and was ongoing and constant throughout the day. Upon arrival to the ED it did go away. Patient had some nausea but no vomiting. He's been having constipation. He's had a history of hernia repair in the upper abdomen. He's had a history of recent kidney stones, but he had pain in the flank at that point in time. He has had blood in his urine and is being followed up with urology for this. No chest pain or trouble breathing. No headache. No coughing.    Past Medical History  Diagnosis Date  . Kidney stones   . Kidney stones   . Kidney stones     There are no active problems to display for this patient.   Past Surgical History  Procedure Laterality Date  . Neck surgery    . Hernia repair    . Right hip replacement    . Joint replacement      Current Outpatient Rx  Name  Route  Sig  Dispense  Refill  . ciprofloxacin (CIPRO) 500 MG tablet   Oral   Take 1 tablet (500 mg total) by mouth 2 (two) times daily.   6 tablet   0   . ondansetron (ZOFRAN ODT) 4 MG disintegrating tablet   Oral   Take 1 tablet (4 mg total) by mouth every 8 (eight) hours as needed for nausea or vomiting.   15 tablet   0   . oxyCODONE-acetaminophen (ROXICET) 5-325 MG per tablet   Oral   Take 1-2 tablets by mouth every 6 (six) hours as needed.   15 tablet   0   . tamsulosin (FLOMAX) 0.4 MG CAPS capsule   Oral   Take 1 capsule (0.4 mg total) by mouth daily.   30 capsule   0     Allergies Review of patient's allergies indicates  no known allergies.  No family history on file.  Social History History  Substance Use Topics  . Smoking status: Never Smoker   . Smokeless tobacco: Not on file  . Alcohol Use: No    Review of Systems  Constitutional: Negative for fever. Eyes: Negative for visual changes. ENT: Negative for sore throat. Cardiovascular: Negative for chest pain. Respiratory: Negative for shortness of breath. Gastrointestinal: Negative for vomiting and diarrhea. Genitourinary: Negative for dysuria. Musculoskeletal: Negative for back pain. Skin: Negative for rash. Neurological: Negative for headaches, focal weakness or numbness. 10 point Review of Systems otherwise negative ____________________________________________   PHYSICAL EXAM:  VITAL SIGNS: ED Triage Vitals  Enc Vitals Group     BP 02/06/15 1725 219/95 mmHg     Pulse Rate 02/06/15 1725 86     Resp 02/06/15 1725 20     Temp 02/06/15 1725 98.8 F (37.1 C)     Temp Source 02/06/15 1725 Oral     SpO2 02/06/15 1725 100 %     Weight 02/06/15 1725 200 lb (90.719 kg)     Height 02/06/15 1725 5\' 6"  (1.676 m)     Head Cir --  Peak Flow --      Pain Score 02/06/15 1726 7     Pain Loc --      Pain Edu? --      Excl. in [place]? --      Constitutional: Alert and oriented. Well appearing and in no distress. Eyes: Conjunctivae are normal. PERRL. Normal extraocular movements. ENT   Head: Normocephalic and atraumatic.   Nose: No congestion/rhinnorhea.   Mouth/Throat: Mucous membranes are moist.   Neck: No stridor. Cardiovascular/Chest: Normal rate, regular rhythm.  No murmurs, rubs, or gallops. Respiratory: Normal respiratory effort without tachypnea nor retractions. Breath sounds are clear and equal bilaterally. No wheezes/rales/rhonchi. Gastrointestinal: Soft. No distention, no guarding, no rebound. Nontender  Genitourinary/rectal:Deferred Musculoskeletal: Nontender with normal range of motion in all extremities. No joint  effusions.  No lower extremity tenderness nor edema. Neurologic:  Normal speech and language. No gross focal neurologic deficits are appreciated. Skin:  Skin is warm, dry and intact. No rash noted. Psychiatric: Mood and affect are normal. Speech and behavior are normal. Patient exhibits appropriate insight and judgment.  ____________________________________________   EKG I, Lisa Roca, MD, the attending physician have personally viewed and interpreted all ECGs.  79 bpm normal axis. Narrow QRS. Positive PVCs. Nonspecific T wave. ____________________________________________  LABS (pertinent positives/negatives)  White blood count 13.9, hemoglobin 14 Metabolic panel within normal limits Lipase 46 Urinalysis 6-30 red blood cells, 30 protein  ____________________________________________  RADIOLOGY All Xrays were viewed by me. Imaging interpreted by Radiologist.  None __________________________________________  PROCEDURES  Procedure(s) performed: None Critical Care performed: None  ____________________________________________   ED COURSE / ASSESSMENT AND PLAN  CONSULTATIONS: None  Pertinent labs & imaging results that were available during my care of the patient were reviewed by me and considered in my medical decision making (see chart for details).   Patient had several hours of epigastric pain and swelling which is now completely relieved. He does have an elevated white blood cell count, but without any abdominal pain or swelling on exam patient and I decided against any further imaging at this point time. He's had hematuria and recent kidney stones, but this episode had no dysuria, and no flank pain, and so I doubt that his symptoms were due to kidney stone. He does have pain medication at home from his urologist in case he has urinary stone pain. My suspicion is that he may have had a partial obstruction or spasm since she's had previous hernia surgery in that area, but  now that things are relieved and he is asymptomatic, I do think he is stable for being discharged without any further imaging at this point time.  Patient / Family / Caregiver informed of clinical course, medical decision-making process, and agree with plan.   I discussed return precautions, follow-up instructions, and discharged instructions with patient and/or family.  ___________________________________________   FINAL CLINICAL IMPRESSION(S) / ED DIAGNOSES   Final diagnoses:  Epigastric pain    FOLLOW UP  Referred to: Primary care physician this week   Lisa Roca, MD 02/06/15 [id number]

## 2015-02-12 DIAGNOSIS — N179 Acute kidney failure, unspecified: Secondary | ICD-10-CM | POA: Diagnosis not present

## 2015-02-12 DIAGNOSIS — E785 Hyperlipidemia, unspecified: Secondary | ICD-10-CM | POA: Diagnosis not present

## 2015-02-12 DIAGNOSIS — R319 Hematuria, unspecified: Secondary | ICD-10-CM | POA: Diagnosis not present

## 2015-02-12 DIAGNOSIS — I10 Essential (primary) hypertension: Secondary | ICD-10-CM | POA: Diagnosis not present

## 2015-03-19 DIAGNOSIS — R319 Hematuria, unspecified: Secondary | ICD-10-CM | POA: Diagnosis not present

## 2015-03-19 DIAGNOSIS — I10 Essential (primary) hypertension: Secondary | ICD-10-CM | POA: Diagnosis not present

## 2015-03-19 DIAGNOSIS — N179 Acute kidney failure, unspecified: Secondary | ICD-10-CM | POA: Diagnosis not present

## 2015-03-19 DIAGNOSIS — N200 Calculus of kidney: Secondary | ICD-10-CM | POA: Diagnosis not present

## 2015-04-08 DIAGNOSIS — I219 Acute myocardial infarction, unspecified: Secondary | ICD-10-CM

## 2015-04-08 DIAGNOSIS — Z951 Presence of aortocoronary bypass graft: Secondary | ICD-10-CM

## 2015-04-08 HISTORY — DX: Acute myocardial infarction, unspecified: I21.9

## 2015-04-08 HISTORY — DX: Presence of aortocoronary bypass graft: Z95.1

## 2015-04-29 DIAGNOSIS — N401 Enlarged prostate with lower urinary tract symptoms: Secondary | ICD-10-CM | POA: Diagnosis not present

## 2015-04-29 DIAGNOSIS — N200 Calculus of kidney: Secondary | ICD-10-CM | POA: Diagnosis not present

## 2015-04-29 DIAGNOSIS — K4090 Unilateral inguinal hernia, without obstruction or gangrene, not specified as recurrent: Secondary | ICD-10-CM | POA: Insufficient documentation

## 2015-04-29 DIAGNOSIS — R933 Abnormal findings on diagnostic imaging of other parts of digestive tract: Secondary | ICD-10-CM | POA: Diagnosis not present

## 2015-04-29 DIAGNOSIS — N411 Chronic prostatitis: Secondary | ICD-10-CM | POA: Diagnosis not present

## 2015-04-29 DIAGNOSIS — R339 Retention of urine, unspecified: Secondary | ICD-10-CM | POA: Diagnosis not present

## 2015-04-29 DIAGNOSIS — I878 Other specified disorders of veins: Secondary | ICD-10-CM | POA: Diagnosis not present

## 2015-04-29 HISTORY — DX: Unilateral inguinal hernia, without obstruction or gangrene, not specified as recurrent: K40.90

## 2015-05-03 ENCOUNTER — Encounter (HOSPITAL_COMMUNITY): Payer: Self-pay | Admitting: Anesthesiology

## 2015-05-03 ENCOUNTER — Inpatient Hospital Stay (HOSPITAL_COMMUNITY): Payer: 59

## 2015-05-03 ENCOUNTER — Inpatient Hospital Stay
Admission: EM | Admit: 2015-05-03 | Discharge: 2015-05-03 | DRG: 232 | Payer: 59 | Attending: Cardiovascular Disease | Admitting: Cardiovascular Disease

## 2015-05-03 ENCOUNTER — Inpatient Hospital Stay (HOSPITAL_COMMUNITY): Payer: 59 | Admitting: Certified Registered Nurse Anesthetist

## 2015-05-03 ENCOUNTER — Encounter
Admission: EM | Disposition: A | Discharge status: Other Acute Inpatient Hospital | Payer: 59 | Source: Home / Self Care | Attending: Cardiovascular Disease

## 2015-05-03 ENCOUNTER — Encounter
Admission: EM | Disposition: A | Discharge status: Other Acute Inpatient Hospital | Payer: Self-pay | Source: Home / Self Care | Attending: Cardiovascular Disease

## 2015-05-03 ENCOUNTER — Encounter (HOSPITAL_COMMUNITY)
Admission: EM | Disposition: A | Discharge status: Home Health | Payer: 59 | Source: Other Acute Inpatient Hospital | Attending: Thoracic Surgery (Cardiothoracic Vascular Surgery)

## 2015-05-03 ENCOUNTER — Encounter: Payer: Self-pay | Admitting: Emergency Medicine

## 2015-05-03 ENCOUNTER — Ambulatory Visit: Payer: Self-pay | Admitting: Family Medicine

## 2015-05-03 ENCOUNTER — Inpatient Hospital Stay (HOSPITAL_COMMUNITY)
Admission: EM | Admit: 2015-05-03 | Discharge: 2015-05-10 | DRG: 236 | Disposition: A | Discharge status: Home Health | Payer: 59 | Source: Other Acute Inpatient Hospital | Attending: Thoracic Surgery (Cardiothoracic Vascular Surgery) | Admitting: Thoracic Surgery (Cardiothoracic Vascular Surgery)

## 2015-05-03 DIAGNOSIS — I442 Atrioventricular block, complete: Secondary | ICD-10-CM | POA: Diagnosis not present

## 2015-05-03 DIAGNOSIS — Z951 Presence of aortocoronary bypass graft: Secondary | ICD-10-CM

## 2015-05-03 DIAGNOSIS — J939 Pneumothorax, unspecified: Secondary | ICD-10-CM | POA: Diagnosis not present

## 2015-05-03 DIAGNOSIS — Z79891 Long term (current) use of opiate analgesic: Secondary | ICD-10-CM | POA: Diagnosis not present

## 2015-05-03 DIAGNOSIS — I2510 Atherosclerotic heart disease of native coronary artery without angina pectoris: Secondary | ICD-10-CM | POA: Diagnosis not present

## 2015-05-03 DIAGNOSIS — I213 ST elevation (STEMI) myocardial infarction of unspecified site: Secondary | ICD-10-CM | POA: Diagnosis not present

## 2015-05-03 DIAGNOSIS — Z79899 Other long term (current) drug therapy: Secondary | ICD-10-CM

## 2015-05-03 DIAGNOSIS — Z96641 Presence of right artificial hip joint: Secondary | ICD-10-CM | POA: Diagnosis present

## 2015-05-03 DIAGNOSIS — I25110 Atherosclerotic heart disease of native coronary artery with unstable angina pectoris: Secondary | ICD-10-CM | POA: Diagnosis not present

## 2015-05-03 DIAGNOSIS — Z7982 Long term (current) use of aspirin: Secondary | ICD-10-CM

## 2015-05-03 DIAGNOSIS — I4892 Unspecified atrial flutter: Secondary | ICD-10-CM | POA: Diagnosis not present

## 2015-05-03 DIAGNOSIS — I2119 ST elevation (STEMI) myocardial infarction involving other coronary artery of inferior wall: Secondary | ICD-10-CM | POA: Diagnosis not present

## 2015-05-03 DIAGNOSIS — E785 Hyperlipidemia, unspecified: Secondary | ICD-10-CM | POA: Diagnosis present

## 2015-05-03 DIAGNOSIS — I2111 ST elevation (STEMI) myocardial infarction involving right coronary artery: Secondary | ICD-10-CM | POA: Diagnosis not present

## 2015-05-03 DIAGNOSIS — Z6832 Body mass index (BMI) 32.0-32.9, adult: Secondary | ICD-10-CM

## 2015-05-03 DIAGNOSIS — K567 Ileus, unspecified: Secondary | ICD-10-CM | POA: Diagnosis not present

## 2015-05-03 DIAGNOSIS — I10 Essential (primary) hypertension: Secondary | ICD-10-CM | POA: Diagnosis present

## 2015-05-03 DIAGNOSIS — D62 Acute posthemorrhagic anemia: Secondary | ICD-10-CM | POA: Diagnosis not present

## 2015-05-03 DIAGNOSIS — I129 Hypertensive chronic kidney disease with stage 1 through stage 4 chronic kidney disease, or unspecified chronic kidney disease: Secondary | ICD-10-CM | POA: Diagnosis present

## 2015-05-03 DIAGNOSIS — I252 Old myocardial infarction: Secondary | ICD-10-CM | POA: Diagnosis not present

## 2015-05-03 DIAGNOSIS — I25118 Atherosclerotic heart disease of native coronary artery with other forms of angina pectoris: Secondary | ICD-10-CM | POA: Diagnosis not present

## 2015-05-03 DIAGNOSIS — J90 Pleural effusion, not elsewhere classified: Secondary | ICD-10-CM | POA: Diagnosis not present

## 2015-05-03 DIAGNOSIS — N182 Chronic kidney disease, stage 2 (mild): Secondary | ICD-10-CM | POA: Diagnosis present

## 2015-05-03 DIAGNOSIS — Z8249 Family history of ischemic heart disease and other diseases of the circulatory system: Secondary | ICD-10-CM

## 2015-05-03 DIAGNOSIS — I4891 Unspecified atrial fibrillation: Secondary | ICD-10-CM | POA: Diagnosis not present

## 2015-05-03 DIAGNOSIS — J9811 Atelectasis: Secondary | ICD-10-CM | POA: Diagnosis not present

## 2015-05-03 DIAGNOSIS — I319 Disease of pericardium, unspecified: Secondary | ICD-10-CM | POA: Diagnosis present

## 2015-05-03 DIAGNOSIS — R079 Chest pain, unspecified: Secondary | ICD-10-CM | POA: Diagnosis not present

## 2015-05-03 HISTORY — PX: CORONARY ARTERY BYPASS GRAFT: SHX141

## 2015-05-03 HISTORY — PX: CARDIAC CATHETERIZATION: SHX172

## 2015-05-03 HISTORY — PX: TEE WITHOUT CARDIOVERSION: SHX5443

## 2015-05-03 LAB — POCT I-STAT 4, (NA,K, GLUC, HGB,HCT)
Glucose, Bld: 141 mg/dL — ABNORMAL HIGH (ref 65–99)
HCT: 29 % — ABNORMAL LOW (ref 39.0–52.0)
Hemoglobin: 9.9 g/dL — ABNORMAL LOW (ref 13.0–17.0)
Potassium: 4 mmol/L (ref 3.5–5.1)
Sodium: 138 mmol/L (ref 135–145)

## 2015-05-03 LAB — POCT I-STAT 3, ART BLOOD GAS (G3+)
Acid-base deficit: 1 mmol/L (ref 0.0–2.0)
Acid-base deficit: 4 mmol/L — ABNORMAL HIGH (ref 0.0–2.0)
Bicarbonate: 23.9 mEq/L (ref 20.0–24.0)
Bicarbonate: 22.3 mEq/L (ref 20.0–24.0)
O2 Saturation: 100 %
O2 Saturation: 89 %
Patient temperature: 36.3
TCO2: 25 mmol/L (ref 0–100)
TCO2: 24 mmol/L (ref 0–100)
pCO2 arterial: 39.6 mmHg (ref 35.0–45.0)
pCO2 arterial: 44 mmHg (ref 35.0–45.0)
pH, Arterial: 7.388 (ref 7.350–7.450)
pH, Arterial: 7.309 — ABNORMAL LOW (ref 7.350–7.450)
pO2, Arterial: 228 mmHg — ABNORMAL HIGH (ref 80.0–100.0)
pO2, Arterial: 59 mmHg — ABNORMAL LOW (ref 80.0–100.0)

## 2015-05-03 LAB — BASIC METABOLIC PANEL
ANION GAP: 10 (ref 5–15)
BUN: 16 mg/dL (ref 6–20)
CHLORIDE: 103 mmol/L (ref 101–111)
CO2: 24 mmol/L (ref 22–32)
Calcium: 8.9 mg/dL (ref 8.9–10.3)
Creatinine, Ser: 1.12 mg/dL (ref 0.61–1.24)
Glucose, Bld: 154 mg/dL — ABNORMAL HIGH (ref 65–99)
Potassium: 4.2 mmol/L (ref 3.5–5.1)
SODIUM: 137 mmol/L (ref 135–145)

## 2015-05-03 LAB — PROTIME-INR
INR: 1.08
INR: 1.58 — ABNORMAL HIGH (ref 0.00–1.49)
Prothrombin Time: 14.2 seconds (ref 11.4–15.0)
Prothrombin Time: 18.9 seconds — ABNORMAL HIGH (ref 11.6–15.2)

## 2015-05-03 LAB — POCT I-STAT, CHEM 8
BUN: 17 mg/dL (ref 6–20)
BUN: 13 mg/dL (ref 6–20)
BUN: 12 mg/dL (ref 6–20)
BUN: 13 mg/dL (ref 6–20)
BUN: 12 mg/dL (ref 6–20)
Calcium, Ion: 1.13 mmol/L (ref 1.13–1.30)
Calcium, Ion: 1.14 mmol/L (ref 1.13–1.30)
Calcium, Ion: 1.01 mmol/L — ABNORMAL LOW (ref 1.13–1.30)
Calcium, Ion: 1.09 mmol/L — ABNORMAL LOW (ref 1.13–1.30)
Calcium, Ion: 1.05 mmol/L — ABNORMAL LOW (ref 1.13–1.30)
Chloride: 102 mmol/L (ref 101–111)
Chloride: 102 mmol/L (ref 101–111)
Chloride: 97 mmol/L — ABNORMAL LOW (ref 101–111)
Chloride: 100 mmol/L — ABNORMAL LOW (ref 101–111)
Chloride: 100 mmol/L — ABNORMAL LOW (ref 101–111)
Creatinine, Ser: 0.8 mg/dL (ref 0.61–1.24)
Creatinine, Ser: 0.8 mg/dL (ref 0.61–1.24)
Creatinine, Ser: 0.7 mg/dL (ref 0.61–1.24)
Creatinine, Ser: 0.7 mg/dL (ref 0.61–1.24)
Creatinine, Ser: 0.7 mg/dL (ref 0.61–1.24)
Glucose, Bld: 140 mg/dL — ABNORMAL HIGH (ref 65–99)
Glucose, Bld: 160 mg/dL — ABNORMAL HIGH (ref 65–99)
Glucose, Bld: 141 mg/dL — ABNORMAL HIGH (ref 65–99)
Glucose, Bld: 163 mg/dL — ABNORMAL HIGH (ref 65–99)
Glucose, Bld: 192 mg/dL — ABNORMAL HIGH (ref 65–99)
HCT: 34 % — ABNORMAL LOW (ref 39.0–52.0)
HCT: 30 % — ABNORMAL LOW (ref 39.0–52.0)
HCT: 24 % — ABNORMAL LOW (ref 39.0–52.0)
HCT: 26 % — ABNORMAL LOW (ref 39.0–52.0)
HCT: 26 % — ABNORMAL LOW (ref 39.0–52.0)
Hemoglobin: 11.6 g/dL — ABNORMAL LOW (ref 13.0–17.0)
Hemoglobin: 10.2 g/dL — ABNORMAL LOW (ref 13.0–17.0)
Hemoglobin: 8.2 g/dL — ABNORMAL LOW (ref 13.0–17.0)
Hemoglobin: 8.8 g/dL — ABNORMAL LOW (ref 13.0–17.0)
Hemoglobin: 8.8 g/dL — ABNORMAL LOW (ref 13.0–17.0)
Potassium: 4.7 mmol/L (ref 3.5–5.1)
Potassium: 4.5 mmol/L (ref 3.5–5.1)
Potassium: 4.2 mmol/L (ref 3.5–5.1)
Potassium: 5.7 mmol/L — ABNORMAL HIGH (ref 3.5–5.1)
Potassium: 4.9 mmol/L (ref 3.5–5.1)
Sodium: 134 mmol/L — ABNORMAL LOW (ref 135–145)
Sodium: 134 mmol/L — ABNORMAL LOW (ref 135–145)
Sodium: 132 mmol/L — ABNORMAL LOW (ref 135–145)
Sodium: 132 mmol/L — ABNORMAL LOW (ref 135–145)
Sodium: 132 mmol/L — ABNORMAL LOW (ref 135–145)
TCO2: 27 mmol/L (ref 0–100)
TCO2: 25 mmol/L (ref 0–100)
TCO2: 23 mmol/L (ref 0–100)
TCO2: 23 mmol/L (ref 0–100)
TCO2: 21 mmol/L (ref 0–100)

## 2015-05-03 LAB — TYPE AND SCREEN
ABO/RH(D): O POS
ANTIBODY SCREEN: NEGATIVE

## 2015-05-03 LAB — LIPID PANEL
CHOL/HDL RATIO: 7.7 ratio
CHOLESTEROL: 230 mg/dL — AB (ref 0–200)
HDL: 30 mg/dL — AB (ref >40)
LDL Cholesterol: 168 mg/dL — ABNORMAL HIGH (ref 0–99)
Triglycerides: 162 mg/dL — ABNORMAL HIGH (ref <150)
VLDL: 32 mg/dL (ref 0–40)

## 2015-05-03 LAB — CBC
HCT: 41 % (ref 40.0–52.0)
HEMATOCRIT: 30.3 % — AB (ref 39.0–52.0)
Hemoglobin: 13.7 g/dL (ref 13.0–18.0)
Hemoglobin: 10 g/dL — ABNORMAL LOW (ref 13.0–17.0)
MCH: 26.3 pg (ref 26.0–34.0)
MCH: 26.6 pg (ref 26.0–34.0)
MCHC: 33.4 g/dL (ref 32.0–36.0)
MCHC: 33 g/dL (ref 30.0–36.0)
MCV: 78.7 fL — AB (ref 80.0–100.0)
MCV: 80.6 fL (ref 78.0–100.0)
Platelets: 265 10*3/uL (ref 150–440)
Platelets: 145 10*3/uL — ABNORMAL LOW (ref 150–400)
RBC: 5.21 MIL/uL (ref 4.40–5.90)
RBC: 3.76 MIL/uL — AB (ref 4.22–5.81)
RDW: 16.5 % — ABNORMAL HIGH (ref 11.5–14.5)
RDW: 15.2 % (ref 11.5–15.5)
WBC: 13 10*3/uL — ABNORMAL HIGH (ref 3.8–10.6)
WBC: 11 10*3/uL — AB (ref 4.0–10.5)

## 2015-05-03 LAB — APTT
APTT: 36 s (ref 24–37)
aPTT: 30 seconds (ref 24–36)

## 2015-05-03 LAB — HEMOGLOBIN AND HEMATOCRIT, BLOOD
HCT: 27 % — ABNORMAL LOW (ref 39.0–52.0)
Hemoglobin: 9.1 g/dL — ABNORMAL LOW (ref 13.0–17.0)

## 2015-05-03 LAB — PLATELET COUNT: Platelets: 160 10*3/uL (ref 150–400)

## 2015-05-03 LAB — HEMOGLOBIN A1C: HEMOGLOBIN A1C: 5.3 % (ref 4.0–6.0)

## 2015-05-03 LAB — MRSA PCR SCREENING: MRSA BY PCR: POSITIVE — AB

## 2015-05-03 LAB — TROPONIN I: Troponin I: 7.01 ng/mL — ABNORMAL HIGH (ref <0.031)

## 2015-05-03 LAB — ABO/RH: ABO/RH(D): O POS

## 2015-05-03 LAB — GLUCOSE, CAPILLARY: GLUCOSE-CAPILLARY: 119 mg/dL — AB (ref 65–99)

## 2015-05-03 SURGERY — CORONARY ARTERY BYPASS GRAFTING (CABG)
Anesthesia: General | Site: Chest

## 2015-05-03 SURGERY — LEFT HEART CATH AND CORONARY ANGIOGRAPHY
Anesthesia: Moderate Sedation

## 2015-05-03 MED ORDER — ARTIFICIAL TEARS OP OINT
TOPICAL_OINTMENT | OPHTHALMIC | Status: AC
  Filled 2015-05-03: qty 3.5

## 2015-05-03 MED ORDER — MIDAZOLAM HCL 2 MG/2ML IJ SOLN
INTRAMUSCULAR | Status: DC | PRN
  Administered 2015-05-03 (×2): 1 mg via INTRAVENOUS

## 2015-05-03 MED ORDER — ASPIRIN 81 MG PO CHEW: 81.0000 mg | CHEWABLE_TABLET | Freq: Every day | ORAL | Status: DC

## 2015-05-03 MED ORDER — METOCLOPRAMIDE HCL 5 MG/ML IJ SOLN
10.0000 mg | Freq: Four times a day (QID) | INTRAMUSCULAR | Status: AC
  Administered 2015-05-03 – 2015-05-04 (×4): 10 mg via INTRAVENOUS
  Filled 2015-05-03 (×4): qty 2

## 2015-05-03 MED ORDER — BIVALIRUDIN BOLUS VIA INFUSION - CUPID
INTRAVENOUS | Status: DC | PRN
  Administered 2015-05-03: 68.025 mg via INTRAVENOUS

## 2015-05-03 MED ORDER — LACTATED RINGERS IV SOLN
INTRAVENOUS | Status: DC
  Administered 2015-05-03: 21:00:00 via INTRAVENOUS

## 2015-05-03 MED ORDER — INSULIN REGULAR HUMAN 100 UNIT/ML IJ SOLN
INTRAMUSCULAR | Status: AC
  Administered 2015-05-03: 1.6 [IU]/h via INTRAVENOUS
  Filled 2015-05-03: qty 2.5

## 2015-05-03 MED ORDER — LACTATED RINGERS IV SOLN: INTRAVENOUS | Status: DC | PRN

## 2015-05-03 MED ORDER — ACETAMINOPHEN 160 MG/5ML PO SOLN
1000.0000 mg | Freq: Four times a day (QID) | ORAL | Status: DC
Start: 2015-05-04 — End: 2015-05-08
  Administered 2015-05-04: 1000 mg
  Filled 2015-05-03: qty 40.6

## 2015-05-03 MED ORDER — MAGNESIUM SULFATE 4 GM/100ML IV SOLN
4.0000 g | Freq: Once | INTRAVENOUS | Status: AC
  Administered 2015-05-03: 4 g via INTRAVENOUS
  Filled 2015-05-03: qty 100

## 2015-05-03 MED ORDER — MIDAZOLAM HCL 2 MG/2ML IJ SOLN: 2.0000 mg | INTRAMUSCULAR | Status: DC | PRN

## 2015-05-03 MED ORDER — VERAPAMIL HCL 2.5 MG/ML IV SOLN
INTRAVENOUS | Status: AC
  Filled 2015-05-03: qty 2

## 2015-05-03 MED ORDER — ACETAMINOPHEN 650 MG RE SUPP
650.0000 mg | Freq: Once | RECTAL | Status: AC
  Administered 2015-05-03: 650 mg via RECTAL

## 2015-05-03 MED ORDER — TEMAZEPAM 15 MG PO CAPS: 15.0000 mg | ORAL_CAPSULE | Freq: Once | ORAL | Status: DC | PRN

## 2015-05-03 MED ORDER — DEXTROSE 5 % IV SOLN
1.5000 g | Freq: Two times a day (BID) | INTRAVENOUS | Status: AC
  Administered 2015-05-04 – 2015-05-05 (×4): 1.5 g via INTRAVENOUS
  Filled 2015-05-03 (×4): qty 1.5

## 2015-05-03 MED ORDER — ROCURONIUM BROMIDE 50 MG/5ML IV SOLN
INTRAVENOUS | Status: AC
  Filled 2015-05-03: qty 2

## 2015-05-03 MED ORDER — FENTANYL CITRATE (PF) 250 MCG/5ML IJ SOLN
INTRAMUSCULAR | Status: AC
  Filled 2015-05-03: qty 5

## 2015-05-03 MED ORDER — FENTANYL CITRATE (PF) 100 MCG/2ML IJ SOLN
INTRAMUSCULAR | Status: AC
  Filled 2015-05-03: qty 2

## 2015-05-03 MED ORDER — HEPARIN (PORCINE) IN NACL 2-0.9 UNIT/ML-% IJ SOLN
INTRAMUSCULAR | Status: AC
  Filled 2015-05-03: qty 500

## 2015-05-03 MED ORDER — LACTATED RINGERS IV SOLN: 500.0000 mL | Freq: Once | INTRAVENOUS | Status: DC | PRN

## 2015-05-03 MED ORDER — IOHEXOL 300 MG/ML  SOLN
INTRAMUSCULAR | Status: DC | PRN
  Administered 2015-05-03: 315 mL via INTRA_ARTERIAL

## 2015-05-03 MED ORDER — PHENYLEPHRINE HCL 10 MG/ML IJ SOLN
30.0000 ug/min | INTRAVENOUS | Status: DC
  Filled 2015-05-03: qty 2

## 2015-05-03 MED ORDER — SODIUM CHLORIDE 0.9 % IJ SOLN
3.0000 mL | Freq: Two times a day (BID) | INTRAMUSCULAR | Status: DC
  Administered 2015-05-04 – 2015-05-05 (×2): 3 mL via INTRAVENOUS

## 2015-05-03 MED ORDER — DEXTROSE 5 % IV SOLN
750.0000 mg | INTRAVENOUS | Status: DC
  Filled 2015-05-03: qty 750

## 2015-05-03 MED ORDER — METOPROLOL TARTRATE 12.5 MG HALF TABLET: 12.5000 mg | ORAL_TABLET | ORAL | Status: DC

## 2015-05-03 MED ORDER — MILRINONE IN DEXTROSE 20 MG/100ML IV SOLN
0.3750 ug/kg/min | INTRAVENOUS | Status: DC
  Filled 2015-05-03: qty 100

## 2015-05-03 MED ORDER — MUPIROCIN 2 % EX OINT
1.0000 "application " | TOPICAL_OINTMENT | Freq: Two times a day (BID) | CUTANEOUS | Status: AC
  Administered 2015-05-04 – 2015-05-08 (×10): 1 via NASAL
  Filled 2015-05-03 (×5): qty 22

## 2015-05-03 MED ORDER — SUCCINYLCHOLINE CHLORIDE 20 MG/ML IJ SOLN
INTRAMUSCULAR | Status: DC | PRN
  Administered 2015-05-03: 120 mg via INTRAVENOUS

## 2015-05-03 MED ORDER — VECURONIUM BROMIDE 10 MG IV SOLR
INTRAVENOUS | Status: DC | PRN
  Administered 2015-05-03 (×3): 5 mg via INTRAVENOUS

## 2015-05-03 MED ORDER — SODIUM CHLORIDE 0.45 % IV SOLN
INTRAVENOUS | Status: DC | PRN
  Administered 2015-05-03: 21:00:00 via INTRAVENOUS

## 2015-05-03 MED ORDER — ASPIRIN 81 MG PO CHEW
324.0000 mg | CHEWABLE_TABLET | Freq: Once | ORAL | Status: AC
  Administered 2015-05-03: 324 mg via ORAL
  Filled 2015-05-03: qty 4

## 2015-05-03 MED ORDER — NITROGLYCERIN 5 MG/ML IV SOLN
INTRAVENOUS | Status: AC
  Filled 2015-05-03: qty 10

## 2015-05-03 MED ORDER — TRAMADOL HCL 50 MG PO TABS
50.0000 mg | ORAL_TABLET | ORAL | Status: DC | PRN
  Administered 2015-05-09: 50 mg via ORAL
  Filled 2015-05-03: qty 1

## 2015-05-03 MED ORDER — NOREPINEPHRINE BITARTRATE 1 MG/ML IV SOLN
0.0000 ug/min | INTRAVENOUS | Status: DC
  Filled 2015-05-03: qty 4

## 2015-05-03 MED ORDER — LACTATED RINGERS IV SOLN
INTRAVENOUS | Status: DC | PRN
  Administered 2015-05-03: 13:00:00 via INTRAVENOUS

## 2015-05-03 MED ORDER — SODIUM CHLORIDE 0.9 % IV SOLN
INTRAVENOUS | Status: DC
Start: 2015-05-03 — End: 2015-05-06
  Administered 2015-05-03: 21:00:00 via INTRAVENOUS

## 2015-05-03 MED ORDER — DEXMEDETOMIDINE HCL IN NACL 400 MCG/100ML IV SOLN
0.1000 ug/kg/h | INTRAVENOUS | Status: AC
  Administered 2015-05-03: .3 ug/kg/h via INTRAVENOUS
  Filled 2015-05-03: qty 100

## 2015-05-03 MED ORDER — EPINEPHRINE HCL 1 MG/ML IJ SOLN
0.0000 ug/min | INTRAMUSCULAR | Status: DC
  Filled 2015-05-03: qty 4

## 2015-05-03 MED ORDER — MORPHINE SULFATE (PF) 2 MG/ML IV SOLN
2.0000 mg | INTRAVENOUS | Status: DC | PRN
  Administered 2015-05-03 – 2015-05-04 (×2): 2 mg via INTRAVENOUS
  Administered 2015-05-04: 4 mg via INTRAVENOUS
  Administered 2015-05-04 (×3): 2 mg via INTRAVENOUS
  Administered 2015-05-05: 4 mg via INTRAVENOUS
  Filled 2015-05-03 (×2): qty 1
  Filled 2015-05-03: qty 2
  Filled 2015-05-03: qty 1
  Filled 2015-05-03 (×2): qty 2

## 2015-05-03 MED ORDER — NITROGLYCERIN IN D5W 200-5 MCG/ML-% IV SOLN
0.0000 ug/min | INTRAVENOUS | Status: DC
  Administered 2015-05-03: 5 ug/min via INTRAVENOUS

## 2015-05-03 MED ORDER — HEPARIN SODIUM (PORCINE) 5000 UNIT/ML IJ SOLN
4000.0000 [IU] | INTRAMUSCULAR | Status: AC
  Administered 2015-05-03: 4000 [IU] via INTRAVENOUS
  Filled 2015-05-03: qty 1

## 2015-05-03 MED ORDER — CHLORHEXIDINE GLUCONATE 0.12% ORAL RINSE (MEDLINE KIT)
15.0000 mL | Freq: Two times a day (BID) | OROMUCOSAL | Status: DC
  Administered 2015-05-03: 15 mL via OROMUCOSAL

## 2015-05-03 MED ORDER — SODIUM CHLORIDE 0.9 % IJ SOLN
OROMUCOSAL | Status: DC | PRN
  Administered 2015-05-03 (×3): 4 mL via TOPICAL

## 2015-05-03 MED ORDER — ALBUMIN HUMAN 5 % IV SOLN
12.5000 g | Freq: Once | INTRAVENOUS | Status: AC
  Administered 2015-05-03: 12.5 g via INTRAVENOUS

## 2015-05-03 MED ORDER — ACETAMINOPHEN 325 MG PO TABS: 650.0000 mg | ORAL_TABLET | ORAL | Status: DC | PRN

## 2015-05-03 MED ORDER — MAGNESIUM SULFATE 50 % IJ SOLN
40.0000 meq | INTRAMUSCULAR | Status: DC
  Filled 2015-05-03: qty 10

## 2015-05-03 MED ORDER — HEPARIN (PORCINE) IN NACL 100-0.45 UNIT/ML-% IJ SOLN
1000.0000 [IU]/h | INTRAMUSCULAR | Status: DC
  Administered 2015-05-03: 1000 [IU]/h via INTRAVENOUS
  Filled 2015-05-03 (×2): qty 250

## 2015-05-03 MED ORDER — SODIUM CHLORIDE 0.9 % IV SOLN
INTRAVENOUS | Status: DC
  Filled 2015-05-03: qty 2.5

## 2015-05-03 MED ORDER — BISACODYL 5 MG PO TBEC
5.0000 mg | DELAYED_RELEASE_TABLET | Freq: Once | ORAL | Status: DC
  Filled 2015-05-03: qty 1

## 2015-05-03 MED ORDER — DOPAMINE-DEXTROSE 3.2-5 MG/ML-% IV SOLN
0.0000 ug/kg/min | INTRAVENOUS | Status: AC
  Administered 2015-05-03: 3 ug/kg/min via INTRAVENOUS

## 2015-05-03 MED ORDER — INSULIN REGULAR BOLUS VIA INFUSION
0.0000 [IU] | Freq: Three times a day (TID) | INTRAVENOUS | Status: DC
  Filled 2015-05-03: qty 10

## 2015-05-03 MED ORDER — MIDAZOLAM HCL 2 MG/2ML IJ SOLN
INTRAMUSCULAR | Status: AC
  Filled 2015-05-03: qty 2

## 2015-05-03 MED ORDER — SODIUM CHLORIDE 0.9 % IV SOLN
INTRAVENOUS | Status: DC
  Administered 2015-05-03: 10:00:00 via INTRAVENOUS

## 2015-05-03 MED ORDER — VANCOMYCIN HCL IN DEXTROSE 1-5 GM/200ML-% IV SOLN
1000.0000 mg | Freq: Once | INTRAVENOUS | Status: AC
  Administered 2015-05-04: 1000 mg via INTRAVENOUS
  Filled 2015-05-03: qty 200

## 2015-05-03 MED ORDER — DEXTROSE 5 % IV SOLN
10.0000 mg | INTRAVENOUS | Status: DC | PRN
  Administered 2015-05-03: 20 ug/min via INTRAVENOUS

## 2015-05-03 MED ORDER — BISACODYL 10 MG RE SUPP: 10.0000 mg | Freq: Every day | RECTAL | Status: DC

## 2015-05-03 MED ORDER — ASPIRIN 81 MG PO CHEW
324.0000 mg | CHEWABLE_TABLET | Freq: Every day | ORAL | Status: DC
Start: 2015-05-04 — End: 2015-05-08

## 2015-05-03 MED ORDER — HEPARIN SODIUM (PORCINE) 1000 UNIT/ML IJ SOLN
INTRAMUSCULAR | Status: DC | PRN
  Administered 2015-05-03: 35000 [IU] via INTRAVENOUS

## 2015-05-03 MED ORDER — TIROFIBAN HCL IV 12.5 MG/250 ML: INTRAVENOUS | Status: DC | PRN

## 2015-05-03 MED ORDER — HEPARIN (PORCINE) IN NACL 100-0.45 UNIT/ML-% IJ SOLN
INTRAMUSCULAR | Status: DC | PRN
Start: 2015-05-03 — End: 2015-05-03
  Administered 2015-05-03: 1000 [IU]/h via INTRAVENOUS

## 2015-05-03 MED ORDER — PANTOPRAZOLE SODIUM 40 MG PO TBEC
40.0000 mg | DELAYED_RELEASE_TABLET | Freq: Every day | ORAL | Status: DC
  Administered 2015-05-05 – 2015-05-10 (×6): 40 mg via ORAL
  Filled 2015-05-03 (×6): qty 1

## 2015-05-03 MED ORDER — TAMSULOSIN HCL 0.4 MG PO CAPS
0.4000 mg | ORAL_CAPSULE | Freq: Every day | ORAL | Status: DC
  Administered 2015-05-04 – 2015-05-09 (×6): 0.4 mg via ORAL
  Filled 2015-05-03 (×6): qty 1

## 2015-05-03 MED ORDER — SODIUM CHLORIDE 0.9 % IV BOLUS (SEPSIS)
INTRAVENOUS | Status: DC | PRN
  Administered 2015-05-03: 250 mL via INTRAVENOUS

## 2015-05-03 MED ORDER — ARTIFICIAL TEARS OP OINT
TOPICAL_OINTMENT | OPHTHALMIC | Status: DC | PRN
Start: 2015-05-03 — End: 2015-05-03
  Administered 2015-05-03: 1 via OPHTHALMIC

## 2015-05-03 MED ORDER — SODIUM CHLORIDE 0.9 % IV SOLN: 250.0000 mL | INTRAVENOUS | Status: DC | PRN

## 2015-05-03 MED ORDER — SODIUM CHLORIDE 0.9 % IV SOLN
Freq: Once | INTRAVENOUS | Status: DC
Start: 2015-05-03 — End: 2015-05-03

## 2015-05-03 MED ORDER — PLASMA-LYTE 148 IV SOLN
INTRAVENOUS | Status: AC
  Administered 2015-05-03: 500 mL
  Filled 2015-05-03: qty 2.5

## 2015-05-03 MED ORDER — SODIUM CHLORIDE 0.9 % IV SOLN
INTRAVENOUS | Status: DC
  Administered 2015-05-03: 12:00:00 via INTRAVENOUS

## 2015-05-03 MED ORDER — DOCUSATE SODIUM 100 MG PO CAPS
200.0000 mg | ORAL_CAPSULE | Freq: Every day | ORAL | Status: DC
  Administered 2015-05-04 – 2015-05-08 (×3): 200 mg via ORAL
  Filled 2015-05-03 (×4): qty 2

## 2015-05-03 MED ORDER — METOPROLOL TARTRATE 1 MG/ML IV SOLN: 2.5000 mg | INTRAVENOUS | Status: DC | PRN

## 2015-05-03 MED ORDER — SODIUM CHLORIDE 0.9 % IV SOLN
250.0000 mg | INTRAVENOUS | Status: DC | PRN
  Administered 2015-05-03: 1.75 mg/kg/h via INTRAVENOUS

## 2015-05-03 MED ORDER — EPHEDRINE SULFATE 50 MG/ML IJ SOLN
INTRAMUSCULAR | Status: DC | PRN
  Administered 2015-05-03: 10 mg via INTRAVENOUS

## 2015-05-03 MED ORDER — ATROPINE SULFATE 1 MG/ML IJ SOLN
INTRAMUSCULAR | Status: DC | PRN
  Administered 2015-05-03: 0.4 mg via INTRAVENOUS

## 2015-05-03 MED ORDER — FAMOTIDINE IN NACL 20-0.9 MG/50ML-% IV SOLN
20.0000 mg | Freq: Two times a day (BID) | INTRAVENOUS | Status: AC
  Administered 2015-05-03: 20 mg via INTRAVENOUS

## 2015-05-03 MED ORDER — ANTISEPTIC ORAL RINSE SOLUTION (CORINZ)
7.0000 mL | Freq: Four times a day (QID) | OROMUCOSAL | Status: DC
  Administered 2015-05-04 (×2): 7 mL via OROMUCOSAL

## 2015-05-03 MED ORDER — DEXMEDETOMIDINE HCL IN NACL 200 MCG/50ML IV SOLN
0.0000 ug/kg/h | INTRAVENOUS | Status: DC
  Administered 2015-05-03: 0.5 ug/kg/h via INTRAVENOUS
  Filled 2015-05-03: qty 50

## 2015-05-03 MED ORDER — ASPIRIN EC 325 MG PO TBEC
325.0000 mg | DELAYED_RELEASE_TABLET | Freq: Every day | ORAL | Status: DC
  Administered 2015-05-04 – 2015-05-10 (×7): 325 mg via ORAL
  Filled 2015-05-03 (×7): qty 1

## 2015-05-03 MED ORDER — BISACODYL 5 MG PO TBEC
10.0000 mg | DELAYED_RELEASE_TABLET | Freq: Every day | ORAL | Status: DC
  Administered 2015-05-04 – 2015-05-05 (×2): 10 mg via ORAL
  Filled 2015-05-03 (×3): qty 2

## 2015-05-03 MED ORDER — VANCOMYCIN HCL 10 G IV SOLR
1500.0000 mg | INTRAVENOUS | Status: AC
  Administered 2015-05-03: 1500 mg via INTRAVENOUS
  Filled 2015-05-03: qty 1500

## 2015-05-03 MED ORDER — SODIUM CHLORIDE 0.9 % IV SOLN: 250.0000 mL | INTRAVENOUS | Status: DC

## 2015-05-03 MED ORDER — CHLORHEXIDINE GLUCONATE CLOTH 2 % EX PADS: 6.0000 | MEDICATED_PAD | Freq: Once | CUTANEOUS | Status: DC

## 2015-05-03 MED ORDER — SODIUM CHLORIDE 0.9 % IV SOLN: 20.0000 mg | INTRAVENOUS | Status: DC | PRN

## 2015-05-03 MED ORDER — DEXTROSE 5 % IV SOLN
1.5000 g | INTRAVENOUS | Status: AC
  Administered 2015-05-03: 750 mg via INTRAVENOUS
  Administered 2015-05-03: 1.5 mg via INTRAVENOUS
  Filled 2015-05-03: qty 1.5

## 2015-05-03 MED ORDER — POTASSIUM CHLORIDE 10 MEQ/50ML IV SOLN: 10.0000 meq | INTRAVENOUS | Status: DC

## 2015-05-03 MED ORDER — HEMOSTATIC AGENTS (NO CHARGE) OPTIME
TOPICAL | Status: DC | PRN
  Administered 2015-05-03: 1 via TOPICAL

## 2015-05-03 MED ORDER — NITROGLYCERIN IN D5W 200-5 MCG/ML-% IV SOLN
2.0000 ug/min | INTRAVENOUS | Status: AC
  Administered 2015-05-03: 5 ug/min via INTRAVENOUS
  Filled 2015-05-03: qty 250

## 2015-05-03 MED ORDER — BIVALIRUDIN 250 MG IV SOLR
INTRAVENOUS | Status: AC
  Filled 2015-05-03: qty 250

## 2015-05-03 MED ORDER — PHENYLEPHRINE HCL 10 MG/ML IJ SOLN
0.0000 ug/min | INTRAVENOUS | Status: DC
  Filled 2015-05-03: qty 2

## 2015-05-03 MED ORDER — CHLORHEXIDINE GLUCONATE 0.12 % MT SOLN: 15.0000 mL | Freq: Once | OROMUCOSAL | Status: DC

## 2015-05-03 MED ORDER — OXYCODONE HCL 5 MG PO TABS
5.0000 mg | ORAL_TABLET | ORAL | Status: DC | PRN
  Administered 2015-05-04 – 2015-05-05 (×8): 10 mg via ORAL
  Filled 2015-05-03 (×9): qty 2

## 2015-05-03 MED ORDER — ONDANSETRON HCL 4 MG/2ML IJ SOLN
4.0000 mg | Freq: Four times a day (QID) | INTRAMUSCULAR | Status: DC | PRN
  Administered 2015-05-04 – 2015-05-06 (×5): 4 mg via INTRAVENOUS
  Filled 2015-05-03 (×5): qty 2

## 2015-05-03 MED ORDER — 0.9 % SODIUM CHLORIDE (POUR BTL) OPTIME
TOPICAL | Status: DC | PRN
  Administered 2015-05-03: 5000 mL

## 2015-05-03 MED ORDER — SODIUM CHLORIDE 0.9 % IV SOLN
INTRAVENOUS | Status: DC
  Filled 2015-05-03: qty 30

## 2015-05-03 MED ORDER — PROTAMINE SULFATE 10 MG/ML IV SOLN
INTRAVENOUS | Status: DC | PRN
  Administered 2015-05-03: 280 mg via INTRAVENOUS

## 2015-05-03 MED ORDER — TIROFIBAN (AGGRASTAT) BOLUS VIA INFUSION
INTRAVENOUS | Status: DC | PRN
  Administered 2015-05-03: 2267.5 ug via INTRAVENOUS

## 2015-05-03 MED ORDER — HEPARIN (PORCINE) IN NACL 2-0.9 UNIT/ML-% IJ SOLN
INTRAMUSCULAR | Status: AC
  Filled 2015-05-03: qty 1000

## 2015-05-03 MED ORDER — AMINOCAPROIC ACID 250 MG/ML IV SOLN
INTRAVENOUS | Status: AC
  Administered 2015-05-03: 70 mL/h via INTRAVENOUS
  Filled 2015-05-03: qty 40

## 2015-05-03 MED ORDER — SODIUM CHLORIDE 0.9 % IJ SOLN: 3.0000 mL | Freq: Two times a day (BID) | INTRAMUSCULAR | Status: DC

## 2015-05-03 MED ORDER — FENTANYL CITRATE (PF) 100 MCG/2ML IJ SOLN
INTRAMUSCULAR | Status: DC | PRN
  Administered 2015-05-03 (×4): 50 ug via INTRAVENOUS

## 2015-05-03 MED ORDER — SODIUM CHLORIDE 0.9 % IJ SOLN: 3.0000 mL | INTRAMUSCULAR | Status: DC | PRN

## 2015-05-03 MED ORDER — HEPARIN SODIUM (PORCINE) 1000 UNIT/ML IJ SOLN
INTRAMUSCULAR | Status: AC
  Filled 2015-05-03: qty 1

## 2015-05-03 MED ORDER — MIDAZOLAM HCL 10 MG/2ML IJ SOLN
INTRAMUSCULAR | Status: AC
  Filled 2015-05-03: qty 4

## 2015-05-03 MED ORDER — ACETAMINOPHEN 160 MG/5ML PO SOLN: 650.0000 mg | Freq: Once | ORAL | Status: AC

## 2015-05-03 MED ORDER — ONDANSETRON HCL 4 MG/2ML IJ SOLN
INTRAMUSCULAR | Status: AC
  Filled 2015-05-03: qty 2

## 2015-05-03 MED ORDER — ALBUMIN HUMAN 5 % IV SOLN
INTRAVENOUS | Status: DC | PRN
  Administered 2015-05-03 (×2): via INTRAVENOUS

## 2015-05-03 MED ORDER — ALBUMIN HUMAN 5 % IV SOLN
250.0000 mL | INTRAVENOUS | Status: AC | PRN
  Administered 2015-05-03: 250 mL via INTRAVENOUS

## 2015-05-03 MED ORDER — POTASSIUM CHLORIDE 2 MEQ/ML IV SOLN
80.0000 meq | INTRAVENOUS | Status: DC
  Filled 2015-05-03: qty 40

## 2015-05-03 MED ORDER — VERAPAMIL HCL 2.5 MG/ML IV SOLN
INTRAVENOUS | Status: DC | PRN
  Administered 2015-05-03: 2.5 mg via INTRA_ARTERIAL

## 2015-05-03 MED ORDER — SUCCINYLCHOLINE CHLORIDE 20 MG/ML IJ SOLN
INTRAMUSCULAR | Status: AC
  Filled 2015-05-03: qty 1

## 2015-05-03 MED ORDER — MIDAZOLAM HCL 5 MG/5ML IJ SOLN
INTRAMUSCULAR | Status: DC | PRN
  Administered 2015-05-03: 1 mg via INTRAVENOUS
  Administered 2015-05-03: 2 mg via INTRAVENOUS
  Administered 2015-05-03 (×2): 5 mg via INTRAVENOUS
  Administered 2015-05-03: 1 mg via INTRAVENOUS
  Administered 2015-05-03: 5 mg via INTRAVENOUS
  Administered 2015-05-03: 1 mg via INTRAVENOUS

## 2015-05-03 MED ORDER — LIDOCAINE HCL (CARDIAC) 20 MG/ML IV SOLN
INTRAVENOUS | Status: AC
  Filled 2015-05-03: qty 5

## 2015-05-03 MED ORDER — ROCURONIUM BROMIDE 100 MG/10ML IV SOLN
INTRAVENOUS | Status: DC | PRN
  Administered 2015-05-03 (×2): 50 mg via INTRAVENOUS

## 2015-05-03 MED ORDER — NITROGLYCERIN 1 MG/10 ML FOR IR/CATH LAB
INTRA_ARTERIAL | Status: DC | PRN
  Administered 2015-05-03: 100 ug via INTRACORONARY
  Administered 2015-05-03 (×3): 200 ug via INTRACORONARY

## 2015-05-03 MED ORDER — FENTANYL CITRATE (PF) 100 MCG/2ML IJ SOLN
INTRAMUSCULAR | Status: DC | PRN
  Administered 2015-05-03 (×2): 250 ug via INTRAVENOUS
  Administered 2015-05-03: 500 ug via INTRAVENOUS
  Administered 2015-05-03: 50 ug via INTRAVENOUS
  Administered 2015-05-03: 200 ug via INTRAVENOUS
  Administered 2015-05-03: 250 ug via INTRAVENOUS
  Administered 2015-05-03: 500 ug via INTRAVENOUS

## 2015-05-03 MED ORDER — PROTAMINE SULFATE 10 MG/ML IV SOLN
INTRAVENOUS | Status: AC
  Filled 2015-05-03: qty 25

## 2015-05-03 MED ORDER — METOPROLOL TARTRATE 25 MG/10 ML ORAL SUSPENSION
12.5000 mg | Freq: Two times a day (BID) | ORAL | Status: DC
  Filled 2015-05-03 (×3): qty 5

## 2015-05-03 MED ORDER — ASPIRIN 81 MG PO CHEW
CHEWABLE_TABLET | ORAL | Status: AC
  Filled 2015-05-03: qty 1

## 2015-05-03 MED ORDER — PROPOFOL 10 MG/ML IV BOLUS
INTRAVENOUS | Status: DC | PRN
  Administered 2015-05-03: 50 mg via INTRAVENOUS

## 2015-05-03 MED ORDER — ATORVASTATIN CALCIUM 20 MG PO TABS
80.0000 mg | ORAL_TABLET | Freq: Every day | ORAL | Status: DC
  Filled 2015-05-03: qty 1

## 2015-05-03 MED ORDER — MORPHINE SULFATE (PF) 2 MG/ML IV SOLN: 1.0000 mg | INTRAVENOUS | Status: AC | PRN

## 2015-05-03 MED ORDER — METOPROLOL TARTRATE 12.5 MG HALF TABLET
12.5000 mg | ORAL_TABLET | Freq: Two times a day (BID) | ORAL | Status: DC
  Filled 2015-05-03 (×3): qty 1

## 2015-05-03 MED ORDER — SODIUM BICARBONATE 8.4 % IV SOLN
50.0000 meq | Freq: Once | INTRAVENOUS | Status: AC
  Administered 2015-05-03: 50 meq via INTRAVENOUS

## 2015-05-03 MED ORDER — MIDAZOLAM HCL 10 MG/2ML IJ SOLN
INTRAMUSCULAR | Status: AC
  Filled 2015-05-03: qty 2

## 2015-05-03 MED ORDER — ACETAMINOPHEN 500 MG PO TABS
1000.0000 mg | ORAL_TABLET | Freq: Four times a day (QID) | ORAL | Status: AC
  Administered 2015-05-04 – 2015-05-07 (×13): 1000 mg via ORAL
  Filled 2015-05-03 (×17): qty 2

## 2015-05-03 MED ORDER — CHLORHEXIDINE GLUCONATE CLOTH 2 % EX PADS
6.0000 | MEDICATED_PAD | Freq: Every day | CUTANEOUS | Status: AC
  Administered 2015-05-05 – 2015-05-07 (×3): 6 via TOPICAL

## 2015-05-03 MED ORDER — ONDANSETRON HCL 4 MG/2ML IJ SOLN
INTRAMUSCULAR | Status: DC | PRN
  Administered 2015-05-03: 4 mg via INTRAVENOUS

## 2015-05-03 SURGICAL SUPPLY — 104 items
BAG DECANTER FOR FLEXI CONT (MISCELLANEOUS) ×4 IMPLANT
BANDAGE ELASTIC 4 VELCRO ST LF (GAUZE/BANDAGES/DRESSINGS) ×4 IMPLANT
BANDAGE ELASTIC 6 VELCRO ST LF (GAUZE/BANDAGES/DRESSINGS) ×4 IMPLANT
BASKET HEART  (ORDER IN 25'S) (MISCELLANEOUS) ×1
BASKET HEART (ORDER IN 25'S) (MISCELLANEOUS) ×1
BASKET HEART (ORDER IN 25S) (MISCELLANEOUS) ×2 IMPLANT
BLADE 11 SAFETY STRL DISP (BLADE) ×4 IMPLANT
BLADE STERNUM SYSTEM 6 (BLADE) ×4 IMPLANT
BLADE SURG 11 STRL SS (BLADE) ×4 IMPLANT
BNDG GAUZE ELAST 4 BULKY (GAUZE/BANDAGES/DRESSINGS) ×4 IMPLANT
CANISTER SUCTION 2500CC (MISCELLANEOUS) ×4 IMPLANT
CANNULA EZ GLIDE AORTIC 21FR (CANNULA) ×8 IMPLANT
CATH CPB KIT HENDRICKSON (MISCELLANEOUS) ×4 IMPLANT
CATH ROBINSON RED A/P 18FR (CATHETERS) ×8 IMPLANT
CATH THORACIC 36FR (CATHETERS) ×4 IMPLANT
CATH THORACIC 36FR RT ANG (CATHETERS) ×4 IMPLANT
CLIP FOGARTY SPRING 6M (CLIP) ×4 IMPLANT
CLIP TI MEDIUM 24 (CLIP) IMPLANT
CLIP TI WIDE RED SMALL 24 (CLIP) ×8 IMPLANT
CONN ST 1/4X3/8  BEN (MISCELLANEOUS) ×2
CONN ST 1/4X3/8 BEN (MISCELLANEOUS) ×2 IMPLANT
CRADLE DONUT ADULT HEAD (MISCELLANEOUS) ×4 IMPLANT
DERMABOND ADVANCED (GAUZE/BANDAGES/DRESSINGS) ×2
DERMABOND ADVANCED .7 DNX12 (GAUZE/BANDAGES/DRESSINGS) ×2 IMPLANT
DRAPE CARDIOVASCULAR INCISE (DRAPES) ×2
DRAPE SLUSH/WARMER DISC (DRAPES) ×4 IMPLANT
DRAPE SRG 135X102X78XABS (DRAPES) ×2 IMPLANT
DRSG COVADERM 4X14 (GAUZE/BANDAGES/DRESSINGS) ×4 IMPLANT
DRSG KUZMA FLUFF (GAUZE/BANDAGES/DRESSINGS) ×4 IMPLANT
ELECT REM PT RETURN 9FT ADLT (ELECTROSURGICAL) ×8
ELECTRODE REM PT RTRN 9FT ADLT (ELECTROSURGICAL) ×4 IMPLANT
GAUZE SPONGE 4X4 12PLY STRL (GAUZE/BANDAGES/DRESSINGS) ×8 IMPLANT
GLOVE BIO SURGEON STRL SZ8.5 (GLOVE) ×4 IMPLANT
GLOVE SURG SIGNA 7.5 PF LTX (GLOVE) ×12 IMPLANT
GOWN STRL REUS W/ TWL LRG LVL3 (GOWN DISPOSABLE) ×8 IMPLANT
GOWN STRL REUS W/ TWL XL LVL3 (GOWN DISPOSABLE) ×4 IMPLANT
GOWN STRL REUS W/TWL LRG LVL3 (GOWN DISPOSABLE) ×8
GOWN STRL REUS W/TWL XL LVL3 (GOWN DISPOSABLE) ×4
HEMOSTAT POWDER SURGIFOAM 1G (HEMOSTASIS) ×12 IMPLANT
HEMOSTAT SURGICEL 2X14 (HEMOSTASIS) ×4 IMPLANT
INSERT FOGARTY XLG (MISCELLANEOUS) IMPLANT
KIT BASIN OR (CUSTOM PROCEDURE TRAY) ×4 IMPLANT
KIT ROOM TURNOVER OR (KITS) ×4 IMPLANT
KIT SUCTION CATH 14FR (SUCTIONS) ×8 IMPLANT
KIT VASOVIEW W/TROCAR VH 2000 (KITS) ×4 IMPLANT
MARKER GRAFT CORONARY BYPASS (MISCELLANEOUS) ×12 IMPLANT
NS IRRIG 1000ML POUR BTL (IV SOLUTION) ×20 IMPLANT
PACK OPEN HEART (CUSTOM PROCEDURE TRAY) ×4 IMPLANT
PAD ARMBOARD 7.5X6 YLW CONV (MISCELLANEOUS) ×8 IMPLANT
PAD ELECT DEFIB RADIOL ZOLL (MISCELLANEOUS) ×4 IMPLANT
PENCIL BUTTON HOLSTER BLD 10FT (ELECTRODE) ×8 IMPLANT
PUNCH AORTIC ROTATE  4.5MM 8IN (MISCELLANEOUS) ×4 IMPLANT
PUNCH AORTIC ROTATE 4.0MM (MISCELLANEOUS) IMPLANT
PUNCH AORTIC ROTATE 4.5MM 8IN (MISCELLANEOUS) IMPLANT
PUNCH AORTIC ROTATE 5MM 8IN (MISCELLANEOUS) IMPLANT
SOLUTION ANTI FOG 6CC (MISCELLANEOUS) ×4 IMPLANT
SPONGE GAUZE 4X4 12PLY STER LF (GAUZE/BANDAGES/DRESSINGS) ×8 IMPLANT
SPONGE LAP 18X18 X RAY DECT (DISPOSABLE) ×4 IMPLANT
SURGIFLO W/THROMBIN 8M KIT (HEMOSTASIS) ×4 IMPLANT
SUT BONE WAX W31G (SUTURE) ×4 IMPLANT
SUT ETHIBOND 2 0 SH (SUTURE) ×8
SUT ETHIBOND 2 0 SH 36X2 (SUTURE) ×8 IMPLANT
SUT MNCRL AB 4-0 PS2 18 (SUTURE) ×4 IMPLANT
SUT PROLENE 3 0 SH DA (SUTURE) ×4 IMPLANT
SUT PROLENE 4 0 RB 1 (SUTURE) ×4
SUT PROLENE 4 0 SH DA (SUTURE) IMPLANT
SUT PROLENE 4-0 RB1 .5 CRCL 36 (SUTURE) ×4 IMPLANT
SUT PROLENE 5 0 C 1 36 (SUTURE) ×12 IMPLANT
SUT PROLENE 6 0 C 1 30 (SUTURE) ×24 IMPLANT
SUT PROLENE 7 0 BV 1 (SUTURE) ×8 IMPLANT
SUT PROLENE 7 0 BV1 MDA (SUTURE) ×8 IMPLANT
SUT PROLENE 8 0 BV175 6 (SUTURE) ×8 IMPLANT
SUT SILK  1 MH (SUTURE) ×8
SUT SILK 1 MH (SUTURE) ×8 IMPLANT
SUT SILK 1 TIES 10X30 (SUTURE) ×4 IMPLANT
SUT SILK 2 0 SH CR/8 (SUTURE) ×4 IMPLANT
SUT SILK 2 0 TIES 10X30 (SUTURE) ×4 IMPLANT
SUT SILK 2 0 TIES 17X18 (SUTURE) ×2
SUT SILK 2-0 18XBRD TIE BLK (SUTURE) ×2 IMPLANT
SUT SILK 3 0 SH CR/8 (SUTURE) ×4 IMPLANT
SUT SILK 4 0 TIE 10X30 (SUTURE) ×8 IMPLANT
SUT STEEL 6MS V (SUTURE) ×4 IMPLANT
SUT STEEL STERNAL CCS#1 18IN (SUTURE) IMPLANT
SUT STEEL SZ 6 DBL 3X14 BALL (SUTURE) ×4 IMPLANT
SUT TEM PAC WIRE 2 0 SH (SUTURE) ×16 IMPLANT
SUT VIC AB 1 CTX 36 (SUTURE) ×4
SUT VIC AB 1 CTX36XBRD ANBCTR (SUTURE) ×4 IMPLANT
SUT VIC AB 2-0 CT1 27 (SUTURE) ×2
SUT VIC AB 2-0 CT1 TAPERPNT 27 (SUTURE) ×2 IMPLANT
SUT VIC AB 2-0 CTX 27 (SUTURE) ×12 IMPLANT
SUT VIC AB 3-0 SH 27 (SUTURE)
SUT VIC AB 3-0 SH 27X BRD (SUTURE) IMPLANT
SUT VIC AB 3-0 X1 27 (SUTURE) ×8 IMPLANT
SUT VICRYL 4-0 PS2 18IN ABS (SUTURE) IMPLANT
SUTURE E-PAK OPEN HEART (SUTURE) ×4 IMPLANT
SYSTEM SAHARA CHEST DRAIN ATS (WOUND CARE) ×4 IMPLANT
TAPE CLOTH SURG 4X10 WHT LF (GAUZE/BANDAGES/DRESSINGS) ×8 IMPLANT
TOWEL OR 17X24 6PK STRL BLUE (TOWEL DISPOSABLE) ×8 IMPLANT
TOWEL OR 17X26 10 PK STRL BLUE (TOWEL DISPOSABLE) ×8 IMPLANT
TRAY FOLEY IC TEMP SENS 16FR (CATHETERS) ×4 IMPLANT
TUBE FEEDING 8FR 16IN STR KANG (MISCELLANEOUS) ×4 IMPLANT
TUBING INSUFFLATION (TUBING) ×4 IMPLANT
UNDERPAD 30X30 INCONTINENT (UNDERPADS AND DIAPERS) ×4 IMPLANT
WATER STERILE IRR 1000ML POUR (IV SOLUTION) ×8 IMPLANT

## 2015-05-03 SURGICAL SUPPLY — 18 items
BALLN MINITREK RX 2.0X20 (BALLOONS) ×3
BALLN TREK RX 2.5X12 (BALLOONS) ×3
BALLN TREK RX 2.75X20 (BALLOONS) ×3
BALLOON MINITREK RX 2.0X20 (BALLOONS) ×1 IMPLANT
BALLOON TREK RX 2.5X12 (BALLOONS) ×1 IMPLANT
BALLOON TREK RX 2.75X20 (BALLOONS) ×1 IMPLANT
CATH INFINITI 5FR ANG PIGTAIL (CATHETERS) ×3 IMPLANT
CATH OPTITORQUE JACKY 4.0 5F (CATHETERS) ×3 IMPLANT
CATH PRIORITY ONE AC 6F (CATHETERS) ×3 IMPLANT
CATH VISTA GUIDE 6FR JR4 (CATHETERS) ×3 IMPLANT
DEVICE INFLAT 30 PLUS (MISCELLANEOUS) ×3 IMPLANT
DEVICE RAD COMP TR BAND LRG (VASCULAR PRODUCTS) ×3 IMPLANT
GLIDESHEATH SLEND SS 6F .021 (SHEATH) ×3 IMPLANT
GUIDEWIRE INTUITION 300CM (WIRE) ×3 IMPLANT
KIT MANI 3VAL PERCEP (MISCELLANEOUS) ×3 IMPLANT
PACK CARDIAC CATH (CUSTOM PROCEDURE TRAY) ×3 IMPLANT
WIRE RUNTHROUGH .014X180CM (WIRE) ×3 IMPLANT
WIRE SAFE-T 1.5MM-J .035X260CM (WIRE) ×3 IMPLANT

## 2015-05-03 NOTE — Progress Notes (Signed)
      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 Roxan Hockey, MD [facility] [phone]

## 2015-05-03 NOTE — Progress Notes (Signed)
ANTICOAGULATION CONSULT NOTE - Initial Consult  Pharmacy Consult for heparin gtt monitoring Indication: chest pain/ACS  No Known Allergies  Patient Measurements: Height: 5\' 6"  (167.6 cm) Weight: 203 lb 9.6 oz (92.352 kg) IBW/kg (Calculated) : 63.8 Heparin Dosing Weight: 83.5 kg  Vital Signs: Temp: 97.5 F (36.4 C) (09/26 1205) Temp Source: Oral (09/26 1150) BP: 111/71 mmHg (09/26 1205) Pulse Rate: 79 (09/26 1215)  Labs:  Recent Labs  05/03/15 0921  HGB 13.7  HCT 41.0  PLT 265  APTT 30  LABPROT 14.2  INR 1.08  CREATININE 1.12  TROPONINI 7.01*    Estimated Creatinine Clearance: 69.9 mL/min (by C-G formula based on Cr of 1.12).   Medical History: Past Medical History  Diagnosis Date  . Kidney stones   . Coronary atherosclerosis     a. CT scan 01/02/2015: extensive coronary atherosclerosis   . HTN (hypertension)   . HLD (hyperlipidemia)     Assessment: Pharmacy consulted to monitor and dose heparin gtt on this 64 year old man who presented to the ED this morning and was taken emergently to the cath lab for STEMI. Patient was not taking anticoagulants prior to admission.   Baseline labs were obtained: INR 1.08 APTT 30 seconds Plt 265  Goal of Therapy:  Heparin level 0.3-0.7 units/ml Monitor platelets by anticoagulation protocol: Yes   Plan:  Heparin gtt was started with no bolus at a rate of 1000 units/hr per MD orders.  Follow up heparin level ordered for 1800 tonight (6 hours post infusion). Pharmacy will monitor and adjust gtt rate as needed.   Darylene Price Swayne 05/03/2015,12:55 PM

## 2015-05-03 NOTE — OR Nursing (Signed)
2nd call to SICU 1935.

## 2015-05-03 NOTE — H&P (Signed)
[name] is an 64 y.o. male.    REFERRIN MD: Annia Belt, MD Chief Complaint: chest pain  HPI: [name] is a 64 yo man with no prior cardiac history. His PMH is significant for neck surgery, hip replacement, hypertension, hyperlipidemia, nephrolithiasis and stage 2 CKD.   He began havign CP on Friday. The pain continued to wax and wane over the weekend. He called his primary this AM and was told to go to the hospital. He went to [facility] and was found to have ST elevation. He was taken to the cath lab. He had 3 vessel CAD. His RCA was occluded. It was opened but was diffusely diseased and at high risk for early re-occlusion. He is transferred to [facility] for CABG for definitive treatment.  He currently denies CP but is understandably anxious.  Past Medical History  Diagnosis Date  . Kidney stones   . Coronary atherosclerosis     a. CT scan 01/02/2015: extensive coronary atherosclerosis   . HTN (hypertension)   . HLD (hyperlipidemia)     Past Surgical History  Procedure Laterality Date  . Neck surgery    . Hernia repair    . Right hip replacement    . Joint replacement    . Cardiac catheterization N/A 05/03/2015    Procedure: Left Heart Cath and Coronary Angiography;  Surgeon: Wellington Hampshire, MD;  Location: [place] [facility];  Service: Cardiovascular;  Laterality: N/A;    Family History  Problem Relation Age of Onset  . CAD Father   . CAD Maternal Uncle   . CAD Paternal Uncle    Social History:  reports that he has never smoked. He does not have any smokeless tobacco history on file. He reports that he drinks alcohol. He reports that he does not use illicit drugs.  Allergies: No Known Allergies  Medications Prior to Admission  Medication Sig Dispense Refill  . ciprofloxacin (CIPRO) 500 MG tablet Take 1 tablet (500 mg total) by mouth 2 (two) times daily. 6 tablet 0  . ondansetron (ZOFRAN ODT) 4 MG disintegrating tablet Take 1 tablet (4 mg total) by mouth every 8  (eight) hours as needed for nausea or vomiting. 15 tablet 0  . oxyCODONE-acetaminophen (ROXICET) 5-325 MG per tablet Take 1-2 tablets by mouth every 6 (six) hours as needed. 15 tablet 0  . tamsulosin (FLOMAX) 0.4 MG CAPS capsule Take 1 capsule (0.4 mg total) by mouth daily. 30 capsule 0    Results for orders placed or performed during the hospital encounter of 05/03/15 (from the past 48 hour(s))  Basic metabolic panel     Status: Abnormal   Collection Time: 05/03/15  9:21 AM  Result Value Ref Range   Sodium 137 135 - 145 mmol/L   Potassium 4.2 3.5 - 5.1 mmol/L   Chloride 103 101 - 111 mmol/L   CO2 24 22 - 32 mmol/L   Glucose, Bld 154 (H) 65 - 99 mg/dL   BUN 16 6 - 20 mg/dL   Creatinine, Ser 1.12 0.61 - 1.24 mg/dL   Calcium 8.9 8.9 - 10.3 mg/dL   GFR calc non Af Amer >60 >60 mL/min   GFR calc Af Amer >60 >60 mL/min    Comment: (NOTE) The eGFR has been calculated using the CKD EPI equation. This calculation has not been validated in all clinical situations. eGFR's persistently <60 mL/min signify possible Chronic Kidney Disease.    Anion gap 10 5 - 15  CBC  Status: Abnormal   Collection Time: 05/03/15  9:21 AM  Result Value Ref Range   WBC 13.0 (H) 3.8 - 10.6 K/uL   RBC 5.21 4.40 - 5.90 MIL/uL   Hemoglobin 13.7 13.0 - 18.0 g/dL   HCT 41.0 40.0 - 52.0 %   MCV 78.7 (L) 80.0 - 100.0 fL   MCH 26.3 26.0 - 34.0 pg   MCHC 33.4 32.0 - 36.0 g/dL   RDW 16.5 (H) 11.5 - 14.5 %   Platelets 265 150 - 440 K/uL  Troponin I     Status: Abnormal   Collection Time: 05/03/15  9:21 AM  Result Value Ref Range   Troponin I 7.01 (H) <0.031 ng/mL    Comment: READ BACK AND VERIFIED WITH MARY ANN MCDANIEL AT 1101 05/03/15 DAS        POSSIBLE MYOCARDIAL ISCHEMIA. SERIAL TESTING RECOMMENDED.   APTT     Status: None   Collection Time: 05/03/15  9:21 AM  Result Value Ref Range   aPTT 30 24 - 36 seconds  Protime-INR     Status: None   Collection Time: 05/03/15  9:21 AM  Result Value Ref Range    Prothrombin Time 14.2 11.4 - 15.0 seconds   INR 1.08   Lipid panel     Status: Abnormal   Collection Time: 05/03/15  9:21 AM  Result Value Ref Range   Cholesterol 230 (H) 0 - 200 mg/dL   Triglycerides 162 (H) <150 mg/dL   HDL 30 (L) >40 mg/dL   Total CHOL/HDL Ratio 7.7 RATIO   VLDL 32 0 - 40 mg/dL   LDL Cholesterol 168 (H) 0 - 99 mg/dL    Comment:        Total Cholesterol/HDL:CHD Risk Coronary Heart Disease Risk Table                     Men   Women  1/2 Average Risk   3.4   3.3  Average Risk       5.0   4.4  2 X Average Risk   9.6   7.1  3 X Average Risk  23.4   11.0        Use the calculated Patient Ratio above and the CHD Risk Table to determine the patient's CHD Risk.        ATP III CLASSIFICATION (LDL):  <100     mg/dL   Optimal  100-129  mg/dL   Near or Above                    Optimal  130-159  mg/dL   Borderline  160-189  mg/dL   High  >190     mg/dL   Very High    No results found.  Review of Systems  Constitutional: Negative for fever and chills.  Respiratory: Positive for shortness of breath.   Cardiovascular: Positive for chest pain.  Neurological: Negative for focal weakness and loss of consciousness.    There were no vitals taken for this visit. Physical Exam  Vitals reviewed. Constitutional: He is oriented to person, place, and time. He appears well-developed and well-nourished. No distress.  HENT:  Head: Normocephalic and atraumatic.  Eyes: Conjunctivae and EOM are normal. No scleral icterus.  Neck: Neck supple. No thyromegaly present.  No carotid bruits  Cardiovascular: Normal rate, regular rhythm, normal heart sounds and intact distal pulses.   No murmur heard. Respiratory: Effort normal and breath sounds normal. He has no  wheezes.  GI: Soft. There is no tenderness.  Musculoskeletal: He exhibits no edema.  Lymphadenopathy:    He has no cervical adenopathy.  Neurological: He is alert and oriented to person, place, and time. No cranial  nerve deficit. He exhibits normal muscle tone.  Skin: Skin is warm and dry.     Assessment/Plan 64 yo man with multiple CR presents with chest pain and has ruled in for MI. He has severe 3 vessel CAD and needs CABG for survival benefit and relief of symptoms.  I discussed the general nature of the procedure, the need for general anesthesia, the use of CPB, and the incisions to be used with the patient. I discussed the expected hospital stay, overall recovery and short and long term outcomes. I reviewed the indications, risks, benefits and alternatives. He understands the risks include, but are not limited to death, stroke, MI, DVT/PE, bleeding, possible need for transfusion, infections, cardiac arrhythmias, as well as other organ system dysfunction including respiratory, renal, or GI complications. He accepts the risks and agrees to proceed.   Anesthesia is currently placing lines in prep for OR  [name] 05/03/2015, 2:01 PM

## 2015-05-03 NOTE — ED Notes (Addendum)
Pt presents with chest pain and shortness of breath with some dizziness since Friday, along with sweats. pcp sent over for further eval .

## 2015-05-03 NOTE — Progress Notes (Signed)
Per CRNA, reported pt was a difficult intubation, glidescope was use. Pt CRNA pt has anterior larynx, and reduce neck mobility. Pt was manually ventilated with bag mask to SICU to room 1. On arrival to SICU pt has a 8.5ETT 23lip noted per RT. Pt is stable at this time. RN's at bedside.

## 2015-05-03 NOTE — Discharge Summary (Signed)
[facility] here to take patient to [facility] for cardiac cath. Family at bedside and updated. VS stable, patient is alert and oriented. TR band to right radial has remaining 94ml air. Sent on heparin gtt and NS infusion. Report called to Ander Purpura, RN at [facility] surgical ICU. Receiving doctor is Dr. Roxan Hockey.

## 2015-05-03 NOTE — ED Notes (Signed)
Called stemi to [facility]omas at [phone]

## 2015-05-03 NOTE — Anesthesia Preprocedure Evaluation (Addendum)
Anesthesia Evaluation  Patient identified by MRN, date of birth, ID band Patient awake    Reviewed: Allergy & Precautions, NPO status , Patient's Chart, lab work & pertinent test results  Airway Mallampati: II  TM Distance: <3 FB Neck ROM: Full    Dental  (+) Teeth Intact, Dental Advisory Given   Pulmonary     + decreased breath sounds      Cardiovascular hypertension, + CAD and + Past MI   Rhythm:Regular     Neuro/Psych    GI/Hepatic   Endo/Other  Morbid obesity  Renal/GU      Musculoskeletal   Abdominal   Peds  Hematology   Anesthesia Other Findings   Reproductive/Obstetrics                           Anesthesia Physical Anesthesia Plan  ASA: IV and emergent  Anesthesia Plan: General   Post-op Pain Management:    Induction: Intravenous  Airway Management Planned: Oral ETT and Video Laryngoscope Planned  Additional Equipment: Arterial line, CVP, PA Cath, 3D TEE and Ultrasound Guidance Line Placement  Intra-op Plan:   Post-operative Plan: Post-operative intubation/ventilation  Informed Consent: I have reviewed the patients History and Physical, chart, labs and discussed the procedure including the risks, benefits and alternatives for the proposed anesthesia with the patient or authorized representative who has indicated his/her understanding and acceptance.   Dental advisory given  Plan Discussed with: CRNA, Anesthesiologist and Surgeon  Anesthesia Plan Comments:         Anesthesia Quick Evaluation

## 2015-05-03 NOTE — CV Procedure (Signed)
[name] is a 64 year old male with no previous cardiac history who was admitted to [facility] today with a 3-4 day history of waxing and waning chest pain. He was found to have inferior lead ST segment elevation with Q waves and emergency cardiac catheterization revealed severe three-vessel coronary artery disease with an occluded right coronary artery. The right coronary artery was reportedly opened at [facility] and he was transferred to [facility] for emergency  coronary artery bypass surgery.  The patient was brought to the operating room at [facility]. General anesthesia was induced and he was intubated without difficulty according to the anesthesia record.  Dr. Annye Asa performed the pre-bypass portion of the transesophageal echocardiography exam.  Impression: Pre-bypass findings:  1. Aortic valve: The aortic valve was trileaflet. The leaflets appeared thin and pliable and opened normally without restriction. There was no aortic insufficiency.  2. Mitral valve: The mitral leaflets opened normally without restriction. There was normal coaptation of the leaflets without flail or prolapsing leaflet segments noted. There was trace mitral insufficiency.  3. Left ventricle: There was akinesis of the basilar and mid inferior wall and inferior septum. The distal inferior wall appeared to be contracting. There was vigorous contractility of the anterior wall and lateral walls. There were no thinned or dyskinetic segments noted. The ejection fraction was estimated at 45%. There was  mid-cavity septal hypertrophy but no evidence of left ventricular outflow obstruction by continuous wave Doppler.  4. Right ventricle: The right ventricular cavity appeared to be normal in size. There was normal appearing contractility of the right ventricular free wall.  5. Tricuspid valve: The tricuspid valve appeared structurally normal and there was trace tricuspid  insufficiency.  6. Ascending aorta: There was a well-defined aortic root and sinotubular ridge. There was no evidence of dissection, aneurysmal dilatation, or effacement of the aortic root and no significant atheromatous disease noted within the walls of the ascending aorta.  7. Descending aorta: There was no aneurysmal dilatation of the descending aorta or significant atheromatous disease appreciated.  Post-bypass findings:  1. Aortic valve: The aortic valve appeared unchanged from the pre-bypass study. The valve opened normally and there was no aortic insufficiency.  2. Mitral valve: The mitral leaflets opened normally and there was trace mitral insufficiency which was unchanged from the pre-bypass study.  3. Left ventricle: There was persistent akinesis of the basilar and mid inferior wall and inferior septum. Ejection fraction was estimated at 40-45%. There was normal contractility of the anterior wall, lateral wall, and anterior septum.  4. Right ventricle: The right ventricular cavity appeared normal in size with normal appearing contractility the right ventricular free wall.  5. Tricuspid valve: The tricuspid valve appeared structurally normal with trace tricuspid insufficiency which was unchanged from the pre-bypass study.  Roberts Gaudy, M.D.

## 2015-05-03 NOTE — Interval H&P Note (Signed)
History and Physical Interval Note:  05/03/2015 2:12 PM  [name]  has presented today for surgery, with the diagnosis of stemi  The various methods of treatment have been discussed with the patient and family. After consideration of risks, benefits and other options for treatment, the patient has consented to  Procedure(s): CORONARY ARTERY BYPASS GRAFTING (CABG) (N/A) TRANSESOPHAGEAL ECHOCARDIOGRAM (TEE) (N/A) as a surgical intervention .  The patient's history has been reviewed, patient examined, no change in status, stable for surgery.  I have reviewed the patient's chart and labs.  Questions were answered to the patient's satisfaction.     [name]

## 2015-05-03 NOTE — ED Provider Notes (Signed)
[facility] Emergency Department Provider Note     Time seen: ----------------------------------------- 9:18 AM on 05/03/2015 -----------------------------------------    I have reviewed the triage vital signs and the nursing notes.   HISTORY  Chief Complaint Chest Pain and Shortness of Breath    HPI [name] is a 64 y.o. male who presents ER with chest tightness that started Friday. Patient had chest tightness, shortness breath, nausea and intermittent May since Friday. This seemed to linger throughout the week. Currently has no complaints other than feeling weak, reportedly had had a stress test about 6 weeks ago by Dr. Clayborn Bigness. He denies fevers chills other complaints. Patient denies any cardiac history but does have a strong family history. Nothing makes his symptoms better or worse.   Past Medical History  Diagnosis Date  . Kidney stones   . Kidney stones   . Kidney stones     There are no active problems to display for this patient.   Past Surgical History  Procedure Laterality Date  . Neck surgery    . Hernia repair    . Right hip replacement    . Joint replacement      Allergies Review of patient's allergies indicates no known allergies.  Social History Social History  Substance Use Topics  . Smoking status: Never Smoker   . Smokeless tobacco: None  . Alcohol Use: No    Review of Systems Constitutional: Negative for fever. Eyes: Negative for visual changes. ENT: Negative for sore throat. Cardiovascular: Positive for chest pain Respiratory: Positive for difficulty breathing Gastrointestinal: Negative for abdominal pain, positive for nausea Genitourinary: Negative for dysuria. Musculoskeletal: Negative for back pain. Skin: Negative for rash. Neurological: Negative for headaches, focal weakness or numbness.  10-point ROS otherwise negative.  ____________________________________________   PHYSICAL EXAM:  VITAL  SIGNS: ED Triage Vitals  Enc Vitals Group     BP 05/03/15 0902 124/78 mmHg     Pulse Rate 05/03/15 0902 103     Resp 05/03/15 0902 20     Temp 05/03/15 0904 97.6 F (36.4 C)     Temp Source 05/03/15 0904 Oral     SpO2 05/03/15 0902 99 %     Weight 05/03/15 0902 200 lb (90.719 kg)     Height --      Head Cir --      Peak Flow --      Pain Score --      Pain Loc --      Pain Edu? --      Excl. in [place]? --     Constitutional: Alert and oriented. Well appearing and in no distress. Eyes: Conjunctivae are normal. PERRL. Normal extraocular movements. ENT   Head: Normocephalic and atraumatic.   Nose: No congestion/rhinnorhea.   Mouth/Throat: Mucous membranes are moist.   Neck: No stridor. Cardiovascular: Normal rate, regular rhythm. Normal and symmetric distal pulses are present in all extremities. No murmurs, rubs, or gallops. Respiratory: Normal respiratory effort without tachypnea nor retractions. Breath sounds are clear and equal bilaterally. No wheezes/rales/rhonchi. Gastrointestinal: Soft and nontender. No distention. No abdominal bruits.  Musculoskeletal: Nontender with normal range of motion in all extremities. No joint effusions.  No lower extremity tenderness nor edema. Neurologic:  Normal speech and language. No gross focal neurologic deficits are appreciated. Speech is normal. No gait instability. Skin:  Skin is warm, dry and intact. No rash noted. Psychiatric: Mood and affect are normal. Speech and behavior are normal. Patient exhibits appropriate insight  and judgment. ____________________________________________  EKG: Interpreted by me. Inferior ST elevation is noted in 3 and aVF. There is ST depression in one aVL. Normal axis. No evidence of hypertrophy. Sinus tachycardia.  ____________________________________________  ED COURSE:  Pertinent labs & imaging results that were available during my care of the patient were reviewed by me and considered in my  medical decision making (see chart for details). We'll discuss with the STEMI team. Patient likely had a STEMI over the weekend. He'll receive aspirin, nitroglycerin glycerin, heparin. ____________________________________________    LABS (pertinent positives/negatives)  Labs Reviewed  BASIC METABOLIC PANEL - Abnormal; Notable for the following:    Glucose, Bld 154 (*)    All other components within normal limits  CBC - Abnormal; Notable for the following:    WBC 13.0 (*)    MCV 78.7 (*)    RDW 16.5 (*)    All other components within normal limits  TROPONIN I - Abnormal; Notable for the following:    Troponin I 7.01 (*)    All other components within normal limits  LIPID PANEL - Abnormal; Notable for the following:    Cholesterol 230 (*)    Triglycerides 162 (*)    HDL 30 (*)    LDL Cholesterol 168 (*)    All other components within normal limits  GLUCOSE, CAPILLARY - Abnormal; Notable for the following:    Glucose-Capillary 119 (*)    All other components within normal limits  APTT  PROTIME-INR  HEMOGLOBIN A1C  I-STAT TROPOININ, ED    RADIOLOGY   Chest x-ray is pending  ____________________________________________  FINAL ASSESSMENT AND PLAN  STEMI  Plan: Patient with labs and imaging as dictated above. Patient likely with subacute STEMI. Discussed with Dr. Fletcher Anon. He likely will need cardiac catheterization. Currently is medically stable for heart catheter.   Earleen Newport, MD   Earleen Newport, MD 05/05/15 [phone]

## 2015-05-03 NOTE — Progress Notes (Signed)
recruitment maneuver was performed per protocol due to o2 saturations declining per RN. Pt tolerated maneuver for 40seconds and arterial blood pressure began to decline and pt was placed back on previous settings. 02 saturation is now stable at 100%. Pt blood pressure is also now stable 122/24mmhg. RN aware and at bedside throughout. RT will continue to monitor.

## 2015-05-03 NOTE — Consult Note (Signed)
Cardiology Consultation Note  Patient ID: [name], MRN: [id number], DOB/AGE: 11-Oct-1950 64 y.o. Admit date: 05/03/2015   Date of Consult: 05/03/2015 Primary Physician: [name], MD Primary Cardiologist: New to [facility]  Chief Complaint: Chest pain  Reason for Consult: STEMI  HPI: 64 y.o. male with h/o extensive coronary atherosclerosis, HTN, HLD, and kidney stones who presented to [facility] ED with stuttering chest pain since 9/23 and was found to have 2 mm inferior ST elevation on ECG with reciprocal changes.  He has previously been followed by Dr. Clayborn Bigness, MD for extensive coronary atherosclerosis seen on CT abdomen for evaluation of kidney stones and increased SOB. He underwent nuclear stress testing in 01/2015 that showed no significant ischemia and normal LV activity. Echo showed EF 50-55%, normal RV systolic function, no valvular stenosis, trivial tricuspid and mitral valve insufficiency.   He has known prior inferior ECG changes with Q waves. He has been getting mildly SOB with exertion, though would not seek medical evaluation.   On 9/23 he developed pressure-like chest pain that was worse with deep inspiration. His wife attempted to get him to come in at that time, however he would not. There was some associated diaphoresis and nausea. Pain did not radiate. He ultimately presented to [facility] on 9/26 with continued chest pain. ECG showed worsening of the the known inferior inferior changes with 2 mm elevation and reciprocal lateral changes. Troponin is pending at this time. WBC 13.0. Cardiology was paged emergently to see the patient in the ED. Upon seeing the patient with Dr. Fletcher Anon, MD it was determined the patient needed a cardiac cath. The procedure was explained in detail and he was taken emergently to the cardiac cath lab at 9:45 AM.     Past Medical History  Diagnosis Date  . Kidney stones   . Coronary atherosclerosis     a. CT scan 01/02/2015: extensive coronary atherosclerosis    . HTN (hypertension)   . HLD (hyperlipidemia)       Most Recent Cardiac Studies: Nuclear stress test 01/29/2015:  FINDINGS: Regional wall motion: reveals normal myocardial thickening and wall  motion. The overall quality of the study is good.  Artifacts noted: no Left ventricular cavity: normal.  Perfusion Analysis: SPECT images demonstrate homogeneous tracer  distribution throughout the myocardium.  Echo 01/2015:  INTERPRETATION  NORMAL LEFT VENTRICULAR SYSTOLIC FUNCTION WITH AN ESTIMATED EF = 50-55 %  NORMAL RIGHT VENTRICULAR SYSTOLIC FUNCTION  TRIVIAL TRICUSPID AND MITRAL VALVE INSUFFICIENCY  NO VALVULAR STENOSIS    Surgical History:  Past Surgical History  Procedure Laterality Date  . Neck surgery    . Hernia repair    . Right hip replacement    . Joint replacement       Home Meds: Prior to Admission medications   Medication Sig Start Date End Date Taking? Authorizing Provider  ciprofloxacin (CIPRO) 500 MG tablet Take 1 tablet (500 mg total) by mouth 2 (two) times daily. 01/02/15   Orbie Pyo, MD  ondansetron (ZOFRAN ODT) 4 MG disintegrating tablet Take 1 tablet (4 mg total) by mouth every 8 (eight) hours as needed for nausea or vomiting. 01/03/15   Joanne Gavel, MD  oxyCODONE-acetaminophen (ROXICET) 5-325 MG per tablet Take 1-2 tablets by mouth every 6 (six) hours as needed. 01/03/15   Joanne Gavel, MD  tamsulosin (FLOMAX) 0.4 MG CAPS capsule Take 1 capsule (0.4 mg total) by mouth daily. 01/02/15   Orbie Pyo, MD    Inpatient Medications:    .  sodium chloride 20 mL/hr at 05/03/15 0865    Allergies: No Known Allergies  Social History   Social History  . Marital Status: Married    Spouse Name: N/A  . Number of Children: N/A  . Years of Education: N/A   Occupational History  . Not on file.   Social History Main Topics  . Smoking status: Never Smoker   . Smokeless tobacco: Not on file  . Alcohol Use: 0.0 oz/week    0  Standard drinks or equivalent per week  . Drug Use: No  . Sexual Activity: Yes    Birth Control/ Protection: None   Other Topics Concern  . Not on file   Social History Narrative     Family History  Problem Relation Age of Onset  . CAD Father   . CAD Maternal Uncle   . CAD Paternal Uncle      Review of Systems: Review of Systems  Constitutional: Positive for malaise/fatigue and diaphoresis. Negative for fever, chills and weight loss.  HENT: Negative for congestion.   Eyes: Negative for discharge and redness.  Respiratory: Positive for shortness of breath. Negative for cough, hemoptysis, sputum production and wheezing.   Cardiovascular: Positive for chest pain. Negative for palpitations, orthopnea, claudication, leg swelling and PND.  Gastrointestinal: Positive for nausea. Negative for heartburn, vomiting and abdominal pain.  Genitourinary: Negative for dysuria and flank pain.  Musculoskeletal: Negative for falls.  Skin: Negative for rash.  Neurological: Positive for weakness. Negative for dizziness, sensory change, speech change and focal weakness.  Endo/Heme/Allergies: Does not bruise/bleed easily.  Psychiatric/Behavioral: Negative for substance abuse. The patient is not nervous/anxious.   All other systems reviewed and are negative.    Labs: No results for input(s): CKTOTAL, CKMB, TROPONINI in the last 72 hours. Lab Results  Component Value Date   WBC 13.0* 05/03/2015   HGB 13.7 05/03/2015   HCT 41.0 05/03/2015   MCV 78.7* 05/03/2015   PLT 265 05/03/2015   No results for input(s): NA, K, CL, CO2, BUN, CREATININE, CALCIUM, PROT, BILITOT, ALKPHOS, ALT, AST, GLUCOSE in the last 168 hours.  Invalid input(s): LABALBU No results found for: CHOL, HDL, LDLCALC, TRIG No results found for: DDIMER  Radiology/Studies:  No results found.  EKG: NSR, 90 bpm, inferior Q waves, 2 mm inferior ST elevation leads III and a VF with reciprocal changes along leads I, V5-V6    Weights: Filed Weights   05/03/15 0902  Weight: 200 lb (90.719 kg)     Physical Exam: Blood pressure 124/78, pulse 103, temperature 97.6 F (36.4 C), temperature source Oral, resp. rate 20, height 5\' 6"  (1.676 m), weight 200 lb (90.719 kg), SpO2 99 %. Body mass index is 32.3 kg/(m^2). General: Well developed, well nourished, in no acute distress. Head: Normocephalic, atraumatic, sclera non-icteric, no xanthomas, nares are without discharge.  Neck: Negative for carotid bruits. JVD not elevated. Lungs: Clear bilaterally to auscultation without wheezes, rales, or rhonchi. Breathing is unlabored. Heart: RRR with S1 S2. No murmurs, rubs, or gallops appreciated. Abdomen: Soft, non-tender, non-distended with normoactive bowel sounds. No hepatomegaly. No rebound/guarding. No obvious abdominal masses. Msk:  Strength and tone appear normal for age. Extremities: No clubbing or cyanosis. No edema.  Distal pedal pulses are 2+ and equal bilaterally. Neuro: Alert and oriented X 3. No facial asymmetry. No focal deficit. Moves all extremities spontaneously. Psych:  Responds to questions appropriately with a normal affect.    Assessment and Plan:  64 y.o. male with h/o extensive coronary  atherosclerosis, HTN, HLD, and kidney stones who presented to [facility] ED with stuttering chest pain since 9/23 and was found to have 2 mm inferior ST elevation on ECG with reciprocal changes.  1. Inferior ST elevation MI: -Emergent cardiac catheterization  -Heparin -Aspirin  -Statin and beta blocker post cath -Significant family history of CAD -Add lipid panel and A1C for risk stratification  -Risks and benefits of cardiac catheterization have been discussed with the patient including risks of bleeding, bruising, infection, kidney damage, stroke, heart attack, and death. The patient understands these risks and is willing to proceed with the procedure. All questions have been answered and concerns listened to.   2.  HTN: -Controlled -Beta blocker as above  3. HLD: -Add lipid panel -Add statin as above   SignedChristell Faith, PA-C Pager: [phone] 05/03/2015, 9:50 AM

## 2015-05-03 NOTE — Progress Notes (Signed)
  Echocardiogram Echocardiogram Transesophageal has been performed.  Jennette Dubin 05/03/2015, 3:48 PM

## 2015-05-03 NOTE — Progress Notes (Signed)
Assessment performed at 1345.

## 2015-05-03 NOTE — Anesthesia Preprocedure Evaluation (Deleted)
Anesthesia Evaluation  Patient identified by MRN, date of birth, ID band Patient awake    Reviewed: Allergy & Precautions, NPO status , Patient's Chart, lab work & pertinent test results  Airway        Dental  (+) Dental Advisory Given   Pulmonary           Cardiovascular hypertension, + CAD and + Past MI       Neuro/Psych    GI/Hepatic   Endo/Other  Morbid obesity  Renal/GU      Musculoskeletal   Abdominal   Peds  Hematology   Anesthesia Other Findings   Reproductive/Obstetrics                             Anesthesia Physical Anesthesia Plan  ASA: IV and emergent  Anesthesia Plan: General   Post-op Pain Management:    Induction: Intravenous  Airway Management Planned: Oral ETT  Additional Equipment: Arterial line, CVP, PA Cath, 3D TEE and Ultrasound Guidance Line Placement  Intra-op Plan:   Post-operative Plan: Post-operative intubation/ventilation  Informed Consent: I have reviewed the patients History and Physical, chart, labs and discussed the procedure including the risks, benefits and alternatives for the proposed anesthesia with the patient or authorized representative who has indicated his/her understanding and acceptance.   Dental advisory given  Plan Discussed with: CRNA, Anesthesiologist and Surgeon  Anesthesia Plan Comments:         Anesthesia Quick Evaluation

## 2015-05-03 NOTE — Transfer of Care (Signed)
Immediate Anesthesia Transfer of Care Note  Patient: [name]  Procedure(s) Performed: Procedure(s): CORONARY ARTERY BYPASS GRAFTING (CABG) times four using left internal mammary artery and right saphenous vein. (N/A) TRANSESOPHAGEAL ECHOCARDIOGRAM (TEE) (N/A)  Patient Location: SICU  Anesthesia Type:General  Level of Consciousness: sedated and Patient remains intubated per anesthesia plan  Airway & Oxygen Therapy: Patient remains intubated per anesthesia plan and Patient placed on Ventilator (see vital sign flow sheet for setting)  Post-op Assessment: Report given to RN and Post -op Vital signs reviewed and stable  Post vital signs: Reviewed and stable  Last Vitals: There were no vitals filed for this visit.  Complications: No apparent anesthesia complications

## 2015-05-03 NOTE — Brief Op Note (Addendum)
05/03/2015  6:16 PM  PATIENT:  [name]  64 y.o. male  PRE-OPERATIVE DIAGNOSIS: 3 vessel CAD, s/p STEMI  POST-OPERATIVE DIAGNOSIS:  3 vessel CAD, s/p STEMI, Pericarditis  PROCEDURE:  Procedure(s):  CORONARY ARTERY BYPASS GRAFTING x 4 -LIMA to LAD -SVG to DIAGONAL -SVG to OM2 -SVG to PL  ENDOSCOPIC HARVEST GREATER SAPHENOUS VEIN  -Right Leg  TRANSESOPHAGEAL ECHOCARDIOGRAM (TEE) (N/A)  SURGEON:  Surgeon(s) and Role:    * [name], MD - Primary  PHYSICIAN ASSISTANT: Ellwood Handler PA-C  ANESTHESIA:   general  EBL:  Total I/O In: 2000 [I.V.:2000] Out: 250 [Urine:250]  BLOOD ADMINISTERED: CELLSAVER  DRAINS: Left Pleural Chest Tubes, Mediastinal Chest drains   LOCAL MEDICATIONS USED:  NONE  SPECIMEN:  No Specimen  DISPOSITION OF SPECIMEN:  N/A  COUNTS:  YES  PLAN OF CARE: Admit to inpatient   PATIENT DISPOSITION:  ICU - intubated and hemodynamically stable.   Delay start of Pharmacological VTE agent (>24hrs) due to surgical blood loss or risk of bleeding: yes  FINDINGS:  CHB on induction, changed to junctional with atropine.  PL 1.0 mm poor quality target, remaining targets good quality Inferior akinesis  XC= 88 min CPB= 132 min, off DDD @ 90, dopamine @ 3

## 2015-05-03 NOTE — Progress Notes (Signed)
No immediate postop 12 lead EKG done per Dr. Roxan Hockey. Pt is in complete heart block under temporary pacer.

## 2015-05-03 NOTE — Anesthesia Procedure Notes (Addendum)
Procedure Name: Intubation Date/Time: 05/03/2015 2:39 PM Performed by: Garrison Columbus T Pre-anesthesia Checklist: Patient identified, Emergency Drugs available, Suction available and Patient being monitored Patient Re-evaluated:Patient Re-evaluated prior to inductionOxygen Delivery Method: Circle system utilized Preoxygenation: Pre-oxygenation with 100% oxygen Intubation Type: IV induction and Rapid sequence Ventilation: Mask ventilation without difficulty Laryngoscope Size: Miller, 2, Glidescope and 4 Grade View: Grade I Tube type: Oral Tube size: 8.5 mm Number of attempts: 2 Airway Equipment and Method: Stylet and Video-laryngoscopy Placement Confirmation: ETT inserted through vocal cords under direct vision,  positive ETCO2 and breath sounds checked- equal and bilateral Secured at: 23 cm Tube secured with: Tape Dental Injury: Teeth and Oropharynx as per pre-operative assessment

## 2015-05-03 NOTE — OR Nursing (Signed)
Off pump call made to SICU nurse [phone].

## 2015-05-03 NOTE — Anesthesia Postprocedure Evaluation (Signed)
  Anesthesia Post-op Note  Patient: [name]  Procedure(s) Performed: Procedure(s): CORONARY ARTERY BYPASS GRAFTING (CABG) times four using left internal mammary artery and right saphenous vein. (N/A) TRANSESOPHAGEAL ECHOCARDIOGRAM (TEE) (N/A)  Patient Location: SICU  Anesthesia Type:General  Level of Consciousness: sedated and Patient remains intubated per anesthesia plan  Airway and Oxygen Therapy: Patient remains intubated per anesthesia plan and Patient placed on Ventilator (see vital sign flow sheet for setting)  Post-op Pain: none  Post-op Assessment: Post-op Vital signs reviewed, Patient's Cardiovascular Status Stable, Respiratory Function Stable and Pain level controlled              Post-op Vital Signs: stable  Last Vitals:  Filed Vitals:   05/03/15 2300  BP: 92/62  Pulse: 89  Temp: 36.6 C  Resp: 24    Complications: No apparent anesthesia complications

## 2015-05-03 NOTE — Progress Notes (Signed)
ANTICOAGULATION CONSULT NOTE - Initial Consult  Pharmacy Consult for Heparin Indication: STEMI  No Known Allergies  Patient Measurements: Height: 5\' 6"  (167.6 cm) Weight: 203 lb 9.6 oz (92.352 kg) IBW/kg (Calculated) : 63.8 Heparin Dosing Weight: 83.5 kg  Vital Signs: Temp: 97.5 F (36.4 C) (09/26 1205) Temp Source: Oral (09/26 1150) BP: 111/71 mmHg (09/26 1205) Pulse Rate: 79 (09/26 1215)  Labs:  Recent Labs  05/03/15 0921  HGB 13.7  HCT 41.0  PLT 265  APTT 30  LABPROT 14.2  INR 1.08  CREATININE 1.12  TROPONINI 7.01*    Estimated Creatinine Clearance: 69.9 mL/min (by C-G formula based on Cr of 1.12).   Medical History: Past Medical History  Diagnosis Date  . Kidney stones   . Coronary atherosclerosis     a. CT scan 01/02/2015: extensive coronary atherosclerosis   . HTN (hypertension)   . HLD (hyperlipidemia)     Medications:  Scheduled:  . aspirin      . [START ON 05/04/2015] aspirin  81 mg Oral Daily  . atorvastatin  80 mg Oral q1800  . sodium chloride  3 mL Intravenous Q12H   Infusions:  . sodium chloride 20 mL/hr at 05/03/15 0933  . sodium chloride 75 mL/hr at 05/03/15 1209  . heparin 1,000 Units/hr (05/03/15 1209)   PRN: sodium chloride, acetaminophen, sodium chloride  Assessment: 64 y/o M with extensive CAD history admitted with NSTEMI now s/p cath lab in need of urgent CABG to begin heparin drip.   Goal of Therapy:  Heparin level 0.3-0.7 units/ml Monitor platelets by anticoagulation protocol: Yes   Plan:  Per cardiology, will begin heparin 1000 units/hr without bolus. Will check a HL 6 hours after start of infusion.   Ulice Dash D 05/03/2015,12:50 PM

## 2015-05-04 ENCOUNTER — Inpatient Hospital Stay (HOSPITAL_COMMUNITY): Payer: 59

## 2015-05-04 ENCOUNTER — Encounter (HOSPITAL_COMMUNITY): Payer: Self-pay | Admitting: Thoracic Surgery (Cardiothoracic Vascular Surgery)

## 2015-05-04 LAB — GLUCOSE, CAPILLARY
Glucose-Capillary: 101 mg/dL — ABNORMAL HIGH (ref 65–99)
Glucose-Capillary: 109 mg/dL — ABNORMAL HIGH (ref 65–99)
Glucose-Capillary: 111 mg/dL — ABNORMAL HIGH (ref 65–99)
Glucose-Capillary: 114 mg/dL — ABNORMAL HIGH (ref 65–99)
Glucose-Capillary: 120 mg/dL — ABNORMAL HIGH (ref 65–99)
Glucose-Capillary: 123 mg/dL — ABNORMAL HIGH (ref 65–99)
Glucose-Capillary: 124 mg/dL — ABNORMAL HIGH (ref 65–99)
Glucose-Capillary: 131 mg/dL — ABNORMAL HIGH (ref 65–99)
Glucose-Capillary: 135 mg/dL — ABNORMAL HIGH (ref 65–99)
Glucose-Capillary: 137 mg/dL — ABNORMAL HIGH (ref 65–99)
Glucose-Capillary: 137 mg/dL — ABNORMAL HIGH (ref 65–99)
Glucose-Capillary: 143 mg/dL — ABNORMAL HIGH (ref 65–99)
Glucose-Capillary: 143 mg/dL — ABNORMAL HIGH (ref 65–99)
Glucose-Capillary: 143 mg/dL — ABNORMAL HIGH (ref 65–99)
Glucose-Capillary: 146 mg/dL — ABNORMAL HIGH (ref 65–99)
Glucose-Capillary: 148 mg/dL — ABNORMAL HIGH (ref 65–99)
Glucose-Capillary: 148 mg/dL — ABNORMAL HIGH (ref 65–99)
Glucose-Capillary: 151 mg/dL — ABNORMAL HIGH (ref 65–99)
Glucose-Capillary: 155 mg/dL — ABNORMAL HIGH (ref 65–99)

## 2015-05-04 LAB — CBC
HEMATOCRIT: 29.8 % — AB (ref 39.0–52.0)
HEMATOCRIT: 28.4 % — AB (ref 39.0–52.0)
HEMATOCRIT: 28.9 % — AB (ref 39.0–52.0)
HEMOGLOBIN: 9.4 g/dL — AB (ref 13.0–17.0)
Hemoglobin: 9.9 g/dL — ABNORMAL LOW (ref 13.0–17.0)
Hemoglobin: 9.4 g/dL — ABNORMAL LOW (ref 13.0–17.0)
MCH: 26.9 pg (ref 26.0–34.0)
MCH: 26.7 pg (ref 26.0–34.0)
MCH: 26.4 pg (ref 26.0–34.0)
MCHC: 33.2 g/dL (ref 30.0–36.0)
MCHC: 33.1 g/dL (ref 30.0–36.0)
MCHC: 32.5 g/dL (ref 30.0–36.0)
MCV: 81 fL (ref 78.0–100.0)
MCV: 80.7 fL (ref 78.0–100.0)
MCV: 81.2 fL (ref 78.0–100.0)
PLATELETS: 155 10*3/uL (ref 150–400)
Platelets: 156 10*3/uL (ref 150–400)
Platelets: 162 10*3/uL (ref 150–400)
RBC: 3.68 MIL/uL — AB (ref 4.22–5.81)
RBC: 3.52 MIL/uL — AB (ref 4.22–5.81)
RBC: 3.56 MIL/uL — ABNORMAL LOW (ref 4.22–5.81)
RDW: 15.5 % (ref 11.5–15.5)
RDW: 15.8 % — ABNORMAL HIGH (ref 11.5–15.5)
RDW: 15.7 % — AB (ref 11.5–15.5)
WBC: 10.8 10*3/uL — AB (ref 4.0–10.5)
WBC: 9.7 10*3/uL (ref 4.0–10.5)
WBC: 11.1 10*3/uL — ABNORMAL HIGH (ref 4.0–10.5)

## 2015-05-04 LAB — POCT I-STAT 3, ART BLOOD GAS (G3+)
Acid-Base Excess: 3 mmol/L — ABNORMAL HIGH (ref 0.0–2.0)
Bicarbonate: 24.9 mEq/L — ABNORMAL HIGH (ref 20.0–24.0)
Bicarbonate: 27.9 mEq/L — ABNORMAL HIGH (ref 20.0–24.0)
O2 Saturation: 97 %
O2 Saturation: 96 %
Patient temperature: 36.8
Patient temperature: 36.6
TCO2: 26 mmol/L (ref 0–100)
TCO2: 29 mmol/L (ref 0–100)
pCO2 arterial: 39.7 mmHg (ref 35.0–45.0)
pCO2 arterial: 42 mmHg (ref 35.0–45.0)
pH, Arterial: 7.404 (ref 7.350–7.450)
pH, Arterial: 7.429 (ref 7.350–7.450)
pO2, Arterial: 85 mmHg (ref 80.0–100.0)
pO2, Arterial: 81 mmHg (ref 80.0–100.0)

## 2015-05-04 LAB — BASIC METABOLIC PANEL
Anion gap: 7 (ref 5–15)
BUN: 12 mg/dL (ref 6–20)
CALCIUM: 7.4 mg/dL — AB (ref 8.9–10.3)
CO2: 24 mmol/L (ref 22–32)
CREATININE: 0.88 mg/dL (ref 0.61–1.24)
Chloride: 103 mmol/L (ref 101–111)
GFR calc non Af Amer: >60 mL/min (ref >60)
Glucose, Bld: 128 mg/dL — ABNORMAL HIGH (ref 65–99)
Potassium: 3.9 mmol/L (ref 3.5–5.1)
SODIUM: 134 mmol/L — AB (ref 135–145)

## 2015-05-04 LAB — POCT I-STAT, CHEM 8
BUN: 12 mg/dL (ref 6–20)
Calcium, Ion: 1.07 mmol/L — ABNORMAL LOW (ref 1.13–1.30)
Chloride: 100 mmol/L — ABNORMAL LOW (ref 101–111)
Creatinine, Ser: 0.8 mg/dL (ref 0.61–1.24)
Glucose, Bld: 133 mg/dL — ABNORMAL HIGH (ref 65–99)
HCT: 27 % — ABNORMAL LOW (ref 39.0–52.0)
Hemoglobin: 9.2 g/dL — ABNORMAL LOW (ref 13.0–17.0)
Potassium: 4.2 mmol/L (ref 3.5–5.1)
Sodium: 136 mmol/L (ref 135–145)
TCO2: 24 mmol/L (ref 0–100)

## 2015-05-04 LAB — CREATININE, SERUM
CREATININE: 0.88 mg/dL (ref 0.61–1.24)
CREATININE: 0.97 mg/dL (ref 0.61–1.24)
GFR calc Af Amer: >60 mL/min (ref >60)

## 2015-05-04 LAB — MAGNESIUM
MAGNESIUM: 2.9 mg/dL — AB (ref 1.7–2.4)
MAGNESIUM: 2.4 mg/dL (ref 1.7–2.4)

## 2015-05-04 MED ORDER — POTASSIUM CHLORIDE 10 MEQ/50ML IV SOLN
10.0000 meq | INTRAVENOUS | Status: AC
  Administered 2015-05-04 (×4): 10 meq via INTRAVENOUS

## 2015-05-04 MED ORDER — CETYLPYRIDINIUM CHLORIDE 0.05 % MT LIQD
7.0000 mL | Freq: Two times a day (BID) | OROMUCOSAL | Status: DC
  Administered 2015-05-04 – 2015-05-06 (×4): 7 mL via OROMUCOSAL

## 2015-05-04 MED ORDER — INFLUENZA VAC SPLIT QUAD 0.5 ML IM SUSY: 0.5000 mL | PREFILLED_SYRINGE | INTRAMUSCULAR | Status: DC | PRN

## 2015-05-04 MED ORDER — PNEUMOCOCCAL VAC POLYVALENT 25 MCG/0.5ML IJ INJ: 0.5000 mL | INJECTION | INTRAMUSCULAR | Status: DC | PRN

## 2015-05-04 MED ORDER — INSULIN ASPART 100 UNIT/ML ~~LOC~~ SOLN
0.0000 [IU] | SUBCUTANEOUS | Status: DC
  Administered 2015-05-04 – 2015-05-05 (×4): 2 [IU] via SUBCUTANEOUS

## 2015-05-04 MED ORDER — FUROSEMIDE 10 MG/ML IJ SOLN
40.0000 mg | Freq: Once | INTRAMUSCULAR | Status: AC
  Administered 2015-05-04: 40 mg via INTRAVENOUS

## 2015-05-04 MED ORDER — ENOXAPARIN SODIUM 40 MG/0.4ML ~~LOC~~ SOLN
40.0000 mg | Freq: Every day | SUBCUTANEOUS | Status: DC
  Administered 2015-05-04 – 2015-05-09 (×6): 40 mg via SUBCUTANEOUS
  Filled 2015-05-04 (×7): qty 0.4

## 2015-05-04 MED FILL — Heparin Sodium (Porcine) Inj 1000 Unit/ML: INTRAMUSCULAR | Qty: 30 | Status: AC

## 2015-05-04 MED FILL — Magnesium Sulfate Inj 50%: INTRAMUSCULAR | Qty: 10 | Status: AC

## 2015-05-04 MED FILL — Potassium Chloride Inj 2 mEq/ML: INTRAVENOUS | Qty: 40 | Status: AC

## 2015-05-04 NOTE — Progress Notes (Signed)
Utilization Review Completed.  

## 2015-05-04 NOTE — Progress Notes (Signed)
PS10/5 40% wean at this time, pt tolerating well. No complications noted.

## 2015-05-04 NOTE — Procedures (Signed)
Extubation Procedure Note  Patient Details:   Name: ELIO HADEN DOB: 03/21/1951 MRN: [id number]   Airway Documentation:  Airway 8.5 mm (Active)  Secured at (cm) 23 cm 05/04/2015  1:14 AM  Measured From Lips 05/04/2015  1:14 AM  Secured Location Right 05/04/2015  1:14 AM  Secured By Pink Tape 05/04/2015  1:14 AM  Site Condition Dry 05/04/2015  1:14 AM    Evaluation  O2 sats: stable throughout Complications: No apparent complications Patient did tolerate procedure well. Bilateral Breath Sounds: Clear, Diminished Suctioning: Oral, Airway Yes   Pt passed all extubate parameters. NIF/VC bedside testing. ABG values also. Pt had a cuff leak prior to extubation. Pt was stable throughout procedure. Pt was placed on 5L [place] o2 sats are 94%. Pt was orally suctioned post extubation and was able to state his name. Explained to pt his throat may feel a little sore post extubation. Pt nodded head. Explained to pt the soreness will subside. Pt is stable at this time and has a mild-moderate strength cough. Encouraged pt to breath through his nose. RN aware.    Leigh Aurora 05/04/2015, 1:48 AM

## 2015-05-04 NOTE — Progress Notes (Signed)
CT surgery PM rounds  Patient examined and record reviewed.Hemodynamics stable,labs satisfactory.Patient had stable day.Continue current care. [name] 05/04/2015

## 2015-05-04 NOTE — Progress Notes (Signed)
Starting rapid wean, RN aware/bedside.

## 2015-05-04 NOTE — Progress Notes (Signed)
Assessing patient post-extubation. Pt is resting well in bed. No complications noted. Pt has no increase WOB/respiratory distress noted. Pt is stable at this time. Pt is responding to commands. o2 saturations are 97%. arterial blood pressure is stable and acceptable. RN at bedside.

## 2015-05-04 NOTE — Progress Notes (Signed)
      [address]       [place] [address]             [phone]      Repeat CXR shows right pneumothorax unchanged to slightly smaller  Remo Lipps C. Roxan Hockey, MD [facility] [phone]

## 2015-05-04 NOTE — Progress Notes (Signed)
Pass ABG parameter values for rapid wean parameters. NIF -42, VC 1.6L. Pt tolerated well and passed all extubate parameters. RN aware.

## 2015-05-04 NOTE — Progress Notes (Signed)
1 Day Post-Op Procedure(s) (LRB): CORONARY ARTERY BYPASS GRAFTING (CABG) times four using left internal mammary artery and right saphenous vein. (N/A) TRANSESOPHAGEAL ECHOCARDIOGRAM (TEE) (N/A) Subjective: Some incisional pain  Objective: Vital signs in last 24 hours: Temp:  [97.3 F (36.3 C)-98.2 F (36.8 C)] 97.9 F (36.6 C) (09/27 0700) Pulse Rate:  [74-103] 90 (09/27 0700) Cardiac Rhythm:  [-] A-V Sequential paced (09/26 2100) Resp:  [7-29] 18 (09/27 0700) BP: (80-124)/(48-78) 116/67 mmHg (09/27 0700) SpO2:  [93 %-100 %] 96 % (09/27 0700) Arterial Line BP: (87-180)/(45-98) 147/69 mmHg (09/27 0700) FiO2 (%):  [40 %-60 %] 40 % (09/27 0114) Weight:  [200 lb (90.719 kg)-208 lb 12.4 oz (94.7 kg)] 207 lb 7.3 oz (94.1 kg) (09/27 0500)  Hemodynamic parameters for last 24 hours: PAP: (22-61)/(11-44) 35/22 mmHg CO:  [4 L/min-5 L/min] 5 L/min CI:  [2 L/min/m2-2.5 L/min/m2] 2.5 L/min/m2  Intake/Output from previous day: 09/26 0701 - 09/27 0700 In: 6668.7 [I.V.:4768.7; Blood:500; NG/GT:60; IV Piggyback:1340] Out: 6283 [Urine:2975; Blood:1625; Chest Tube:148] Intake/Output this shift:    General appearance: alert, cooperative and no distress Neurologic: intact Heart: regular rate and rhythm Lungs: diminished breath sounds bibasilar Abdomen: normal findings: mildly distended, nontender  Lab Results:  Recent Labs  05/03/15 2042 05/04/15 0242  WBC 11.0* 10.8*  HGB 10.0* 9.9*  HCT 30.3* 29.8*  PLT 145* 155   BMET:  Recent Labs  05/03/15 0921  05/03/15 1907 05/03/15 2032 05/04/15 0242  NA 137  < > 132* 138 134*  K 4.2  < > 4.9 4.0 3.9  CL 103  < > 100*  --  103  CO2 24  --   --   --  24  GLUCOSE 154*  < > 192* 141* 128*  BUN 16  < > 12  --  12  CREATININE 1.12  < > 0.70  --  0.88  CALCIUM 8.9  --   --   --  7.4*  < > = values in this interval not displayed.  PT/INR:  Recent Labs  05/03/15 2042  LABPROT 18.9*  INR 1.58*   ABG    Component Value Date/Time   PHART 7.429 05/04/2015 0252   HCO3 27.9* 05/04/2015 0252   TCO2 29 05/04/2015 0252   ACIDBASEDEF 4.0* 05/03/2015 2033   O2SAT 96.0 05/04/2015 0252   CBG (last 3)   Recent Labs  05/03/15 2145 05/03/15 2246 05/03/15 2353  GLUCAP 123* 101* 109*    Assessment/Plan: S/P Procedure(s) (LRB): CORONARY ARTERY BYPASS GRAFTING (CABG) times four using left internal mammary artery and right saphenous vein. (N/A) TRANSESOPHAGEAL ECHOCARDIOGRAM (TEE) (N/A) -  CV- in CHB, pacer dependent, dc beta blocker  Has good cardiac function- dc swan  RESP- has a small right pneumothorax on CXR. Lungs had adhesions and no clear pleural space medially. Will repeat CXR at noon- if larger will need CT. He has no leak from the CT that are in place  RENAL- creatinine OK, diurese  ENDO- CBG elevated on insulin drip- drip has expired so new drip started at lower rate  Transition off drip  Anemia- mild, secondary to ABL  DC CT  OOB, ambulate  SCD + enoxaparin for DVT prophylaxis   LOS: 1 day    [name] 05/04/2015

## 2015-05-04 NOTE — Op Note (Signed)
NAMEJAMAHL, [name] NO.:  [id number]  MEDICAL RECORD NO.:  [id number]  LOCATION:  IC07A                        FACILITY:  [facility]  PHYSICIAN:  Revonda Standard. Roxan Hockey, M.D.DATE OF BIRTH:  Dec 11, 1950  DATE OF PROCEDURE:  05/03/2015 DATE OF DISCHARGE:  05/03/2015                              OPERATIVE REPORT   PREOPERATIVE DIAGNOSIS:  Severe three-vessel disease status post ST- elevation myocardial infarction.  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  PROCEDURE:   Median sternotomy; extracorporeal circulation Coronary artery bypass grafting x 4   Left internal mammary artery to left anterior descending  Saphenous vein graft to first diagonal  Saphenous vein graft to obtuse marginal 2  Saphenous vein graft to posterior lateral Endoscopic vein harvest right leg.  SURGEON:  Revonda Standard. Roxan Hockey, M.D.  ASSISTANT:  Ellwood Handler, PA.  ANESTHESIA:  General.  FINDINGS:   1. Transesophageal echocardiography revealed inferior basal akinesis, otherwise preserved wall motion.  No significant valvular pathology.   2. The patient went into complete heart block requiring atropine after induction. He was in junctional rhythm thereafter. 3. Posterolateral 1 mm poor quality target, remaining targets good quality.Good quality conduits.   4. Weaned from bypass DDD paced at 90 beats per minute and with 3 mcg/kg per minute of dopamine.   5. Postbypass transesophageal echocardiography unchanged. 6. Severe intrapericardial adhesions.  CLINICAL NOTE:  [name] is a 64 year old man who presented to [facility] today with a 3-day history of waxing and waning chest pain.  He had a ST-elevation and was taken the catheterization laboratory and was found to have a totally occluded right coronary.  This was partially opened with angioplasty.  The patient's symptoms resolved, but given the  tenuous nature of the vessels, he was referred for emergent coronary bypass grafting.  He was transferred directly to the preoperative holding area.  The patient was advised to undergo coronary bypass grafting.  The indications, risks, benefits, and alternatives were discussed in detail with the patient. He understood, accepted the risks, and agreed to proceed.  The Anesthesia Service was present and placed a Swan-Ganz catheter and arterial blood pressure monitoring line.  OPERATIVE NOTE:  [name] was brought to the operating room on May 03, 2015.  He had induction of general anesthesia. Transesophageal echocardiography was performed.  Intravenous antibiotics were administered.  Transesophageal echocardiography findings were as noted above.  While the patient was being prepped and draped, he went into complete heart block and required atropine.  He then was in junctional rhythm thereafter.  His blood pressure was stable with a junctional rhythm and his cardiac output was maintained with that rhythm as well.  The chest, abdomen, and legs were prepped and draped in the usual sterile fashion.  An incision was made in the medial aspect of the right leg at the level of the knee and the greater saphenous vein was identified.  It was harvested from the right leg endoscopically from the groin to just above the ankle. The saphenous vein was of good quality.  Simultaneously with the vein harvest, a median sternotomy was performed and the left internal mammary artery was harvested  using standard technique.  It was a good quality vessel.  The patient was fully heparinized prior to dividing the distal end of the mammary artery.  Of note, there were adhesions in the left pleural space that were noted during the mammary takedown.  The sternal retractor was placed.  The pericardium was incised. There were adhesions in the pericardium as well, consistent with pericarditis. These adhesions were  taken down.  The aorta was identified.  After confirming adequate anticoagulation with ACT measurement, the aorta was cannulated via concentric 2-0 Ethibond pledgeted pursestring sutures.  A dual-stage venous cannula was placed via a pursestring suture in the right atrium.  Cardiopulmonary bypass was initiated.  Flows were maintained per protocol, and the patient was cooled to 32 degrees Celsius.  The coronary arteries were inspected and anastomotic sites were chosen.  The posterolateral branch of the right coronary was a small vessel and not ideal for grafting, but it was the only graftable vessel in the right coronary distribution.  The remaining targets appeared satisfactory.  A foam pad was placed in the pericardium to insulate the heart.  A temperature probe was placed in myocardial septum.  A cardioplegia cannula was placed in the ascending aorta.  The aorta was crossclamped.  The left ventricle was emptied via the aortic root vent.  Cardiac arrest then was achieved with a combination of cold antegrade blood cardioplegia and topical iced saline.  1 L of cardioplegia was administered.  There was a rapid diastolic arrest. There was septal cooling to 9 degrees Celsius.  The following distal anastomoses were performed.  First, a reversed saphenous vein graft was placed end-to-side to the posterolateral branch of the right coronary.  This was a 1 mm poor quality target vessel.  The vein was of good quality.  The anastomosis was performed end-to-side with running 7-0 Prolene suture.  A 1 mm probe did pass proximally and distally from the anastomosis.  Cardioplegia was administered. There was fair hemostasis and sluggish, but adequate flow.  Next, a reversed saphenous vein graft was placed end-to-side to the first diagonal branch to the LAD.  This was a 1.5 mm good quality target.  The vein was of good quality.  An end-to-side anastomosis was performed with a running 7-0 Prolene suture.   The anastomosis probed easily proximally and distally.  Cardioplegia was administered.  There was good flow through the graft and good hemostasis.  After giving additional cardioplegia down the aortic root, the heart was elevated.  OM2 was identified.  It was a difficult anastomosis due to the size of the heart and the patient's body habitus, but OM2 was 1.5 mm good quality target vessel.  The vein was anastomosed end-to-side with a running 7-0 Prolene suture.  Again, a probe passed easily and there was good flow and good hemostasis with cardioplegia administration.  Next, the left internal mammary artery was brought through an incision in the pericardium.  The distal end was beveled.  It was anastomosed end- to-side to the distal LAD.  Both of these were 1.5 mm good quality vessels.  The anastomosis was performed with a running 8-0 Prolene suture.  At completion of the anastomosis, the bulldog clamp was briefly removed to inspect for hemostasis.  Rapid septal rewarming was noted.  The bulldog clamp was replaced.  The mammary pedicle was tacked to the epicardial surface of the heart with 6-0 Prolene sutures.  Additional cardioplegia was administered.  The vein grafts were cut to length.  The cardioplegia  cannula was removed from the ascending aorta. The proximal vein graft anastomoses were performed to 4.5 mm punch aortotomies with running 6-0 Prolene sutures.  At the completion of final proximal anastomosis, the patient was placed in Trendelenburg position.  Lidocaine was administered.  The left ventricle and aortic root were de-aired.  The bulldog clamp was again removed from the left mammary artery and the aortic crossclamp was removed.  Total crossclamp time was 88 minutes.  The patient did not require defibrillation, but was in heart block initially and then a slow junctional rhythm.  While rewarming was completed, all proximal and distal anastomoses were inspected for  hemostasis. Epicardial pacing wires were placed on the right ventricle and right atrium.  When the patient had rewarmed to a Leonides Minder temperature of 37 degrees Celsius, he was weaned from cardiopulmonary bypass on the first attempt.  He was DDD paced at 90 beats per minute and on dopamine at 3 mcg/kg per minute at the time of separation from bypass.  Total bypass time was 132 minutes.  The initial cardiac index was greater than 2 L/minute/m2, and he remained hemodynamically stable throughout the postbypass period.  Postbypass transesophageal echocardiography was essentially unchanged from the prebypass study.  A test dose of protamine was administered and was well tolerated.  The atrial and aortic cannulae were removed.  The remainder of the protamine was administered without incident.  The chest was irrigated with warm saline.  Hemostasis was achieved.  A 32-French Blake drain was placed into the medial aspect of the left pleural space. The lateral aspect was obliterated with adhesions.  A 32-French chest tube was placed in the anterior mediastinum and these were secured with #1 silk sutures.  The pericardium was not closed.  The sternum was closed with a combination of single and double heavy gauge stainless steel wires.  The pectoralis fascia, subcutaneous tissue, and skin were closed in standard fashion.  All sponge, needle, and instrument counts were correct at the end of the procedure.  The patient was taken from the operating room to the Surgical Intensive Care Unit intubated and in good condition.     Revonda Standard Roxan Hockey, M.D.     SCH/MEDQ  D:  05/03/2015  T:  05/04/2015  Job:  [id number]

## 2015-05-05 ENCOUNTER — Encounter (HOSPITAL_COMMUNITY): Payer: Self-pay | Admitting: Cardiology

## 2015-05-05 ENCOUNTER — Inpatient Hospital Stay (HOSPITAL_COMMUNITY): Payer: 59

## 2015-05-05 LAB — BASIC METABOLIC PANEL WITH GFR
Anion gap: 6 (ref 5–15)
BUN: 11 mg/dL (ref 6–20)
CO2: 29 mmol/L (ref 22–32)
Calcium: 7.9 mg/dL — ABNORMAL LOW (ref 8.9–10.3)
Chloride: 100 mmol/L — ABNORMAL LOW (ref 101–111)
Creatinine, Ser: 0.95 mg/dL (ref 0.61–1.24)
GFR calc Af Amer: >60 mL/min
GFR calc non Af Amer: >60 mL/min
Glucose, Bld: 134 mg/dL — ABNORMAL HIGH (ref 65–99)
Potassium: 4.1 mmol/L (ref 3.5–5.1)
Sodium: 135 mmol/L (ref 135–145)

## 2015-05-05 LAB — CBC
HEMATOCRIT: 27.5 % — AB (ref 39.0–52.0)
HEMOGLOBIN: 8.8 g/dL — AB (ref 13.0–17.0)
MCH: 26.2 pg (ref 26.0–34.0)
MCHC: 32 g/dL (ref 30.0–36.0)
MCV: 81.8 fL (ref 78.0–100.0)
Platelets: 162 10*3/uL (ref 150–400)
RBC: 3.36 MIL/uL — ABNORMAL LOW (ref 4.22–5.81)
RDW: 15.9 % — ABNORMAL HIGH (ref 11.5–15.5)
WBC: 9.5 10*3/uL (ref 4.0–10.5)

## 2015-05-05 LAB — GLUCOSE, CAPILLARY
Glucose-Capillary: 132 mg/dL — ABNORMAL HIGH (ref 65–99)
Glucose-Capillary: 141 mg/dL — ABNORMAL HIGH (ref 65–99)
Glucose-Capillary: 158 mg/dL — ABNORMAL HIGH (ref 65–99)
Glucose-Capillary: 159 mg/dL — ABNORMAL HIGH (ref 65–99)
Glucose-Capillary: 170 mg/dL — ABNORMAL HIGH (ref 65–99)
Glucose-Capillary: 149 mg/dL — ABNORMAL HIGH (ref 65–99)

## 2015-05-05 MED ORDER — METOCLOPRAMIDE HCL 5 MG/ML IJ SOLN
10.0000 mg | Freq: Four times a day (QID) | INTRAMUSCULAR | Status: AC
  Administered 2015-05-05 (×4): 10 mg via INTRAVENOUS
  Filled 2015-05-05 (×4): qty 2

## 2015-05-05 MED ORDER — FUROSEMIDE 10 MG/ML IJ SOLN
40.0000 mg | Freq: Once | INTRAMUSCULAR | Status: AC
  Administered 2015-05-05: 40 mg via INTRAVENOUS
  Filled 2015-05-05: qty 4

## 2015-05-05 MED ORDER — INSULIN ASPART 100 UNIT/ML ~~LOC~~ SOLN
0.0000 [IU] | Freq: Three times a day (TID) | SUBCUTANEOUS | Status: DC
  Administered 2015-05-05 (×3): 3 [IU] via SUBCUTANEOUS
  Administered 2015-05-06: 2 [IU] via SUBCUTANEOUS
  Administered 2015-05-06: 3 [IU] via SUBCUTANEOUS
  Administered 2015-05-06 – 2015-05-07 (×3): 2 [IU] via SUBCUTANEOUS

## 2015-05-05 MED ORDER — AMIODARONE HCL 200 MG PO TABS
400.0000 mg | ORAL_TABLET | Freq: Two times a day (BID) | ORAL | Status: DC
  Administered 2015-05-05 – 2015-05-09 (×10): 400 mg via ORAL
  Filled 2015-05-05 (×11): qty 2

## 2015-05-05 MED ORDER — POTASSIUM CHLORIDE 10 MEQ/50ML IV SOLN
10.0000 meq | INTRAVENOUS | Status: AC
  Administered 2015-05-05 (×2): 10 meq via INTRAVENOUS
  Filled 2015-05-05 (×2): qty 50

## 2015-05-05 MED FILL — Electrolyte-R (PH 7.4) Solution: INTRAVENOUS | Qty: 4000 | Status: AC

## 2015-05-05 MED FILL — Mannitol IV Soln 20%: INTRAVENOUS | Qty: 500 | Status: AC

## 2015-05-05 MED FILL — Lidocaine HCl IV Inj 20 MG/ML: INTRAVENOUS | Qty: 5 | Status: AC

## 2015-05-05 MED FILL — Sodium Chloride IV Soln 0.9%: INTRAVENOUS | Qty: 2000 | Status: AC

## 2015-05-05 MED FILL — Heparin Sodium (Porcine) Inj 1000 Unit/ML: INTRAMUSCULAR | Qty: 10 | Status: AC

## 2015-05-05 MED FILL — Sodium Bicarbonate IV Soln 8.4%: INTRAVENOUS | Qty: 50 | Status: AC

## 2015-05-05 NOTE — Care Management Note (Signed)
Case Management Note  Patient Details  Name: [name] MRN: [id number] Date of Birth: 1950-08-22  Subjective/Objective:   Pt lives with spouse who will be available for 24/7 assistance when he is medically stable for discharge.  Adult son and dtr will also provide assistance.  Assisted spouse with documentation needed for reimbursement from [facility] insurance policy per request.                             Expected Discharge Plan:  [place]  Discharge planning Services  CM Consult  Status of Service:  In process, will continue to follow  Girard Cooter, RN 05/05/2015, 3:00 PM

## 2015-05-05 NOTE — Progress Notes (Signed)
2 Days Post-Op Procedure(s) (LRB): CORONARY ARTERY BYPASS GRAFTING (CABG) times four using left internal mammary artery and right saphenous vein. (N/A) TRANSESOPHAGEAL ECHOCARDIOGRAM (TEE) (N/A) Subjective: C/o nausea, some incisional pain  Objective: Vital signs in last 24 hours: Temp:  [97.9 F (36.6 C)-98.5 F (36.9 C)] 98.3 F (36.8 C) (09/28 0359) Pulse Rate:  [84-101] 101 (09/28 0700) Cardiac Rhythm:  [-] A-V Sequential paced (09/28 0700) Resp:  [12-26] 22 (09/28 0700) BP: (104-147)/(59-81) 111/60 mmHg (09/28 0700) SpO2:  [95 %-100 %] 95 % (09/28 0700) Arterial Line BP: (105-165)/(59-90) 105/90 mmHg (09/27 1700) Weight:  [205 lb 1.6 oz (93.033 kg)] 205 lb 1.6 oz (93.033 kg) (09/28 0600)  Hemodynamic parameters for last 24 hours: PAP: (25)/(12) 25/12 mmHg CO:  [5.5 L/min] 5.5 L/min CI:  [2.7 L/min/m2] 2.7 L/min/m2  Intake/Output from previous day: 09/27 0701 - 09/28 0700 In: 1032.1 [P.O.:120; I.V.:612.1; IV Piggyback:300] Out: 2165 [Urine:2125; Chest Tube:40] Intake/Output this shift:    General appearance: alert, cooperative and no distress Neurologic: intact Heart: irregularly irregular rhythm Lungs: diminished breath sounds bibasilar Abdomen: mildly distended, hypoactive BS  Lab Results:  Recent Labs  05/04/15 1640 05/04/15 1642 05/05/15 0417  WBC 11.1*  --  9.5  HGB 9.4* 9.2* 8.8*  HCT 28.9* 27.0* 27.5*  PLT 162  --  162   BMET:  Recent Labs  05/04/15 0242  05/04/15 1642 05/05/15 0417  NA 134*  --  136 135  K 3.9  --  4.2 4.1  CL 103  --  100* 100*  CO2 24  --   --  29  GLUCOSE 128*  --  133* 134*  BUN 12  --  12 11  CREATININE 0.88  < > 0.80 0.95  CALCIUM 7.4*  --   --  7.9*  < > = values in this interval not displayed.  PT/INR:  Recent Labs  05/03/15 2042  LABPROT 18.9*  INR 1.58*   ABG    Component Value Date/Time   PHART 7.429 05/04/2015 0252   HCO3 27.9* 05/04/2015 0252   TCO2 24 05/04/2015 1642   ACIDBASEDEF 4.0* 05/03/2015  2033   O2SAT 96.0 05/04/2015 0252   CBG (last 3)   Recent Labs  05/04/15 1910 05/04/15 2337 05/05/15 0351  GLUCAP 148* 143* 141*    Assessment/Plan: S/P Procedure(s) (LRB): CORONARY ARTERY BYPASS GRAFTING (CABG) times four using left internal mammary artery and right saphenous vein. (N/A) TRANSESOPHAGEAL ECHOCARDIOGRAM (TEE) (N/A) -  CV- in rate controlled atrial fib today after being in [facility] yesterday  RESP- right pneumothorax resolving  IS  RENAL- creatinine OK, continue diuresis  ENDO- CBG well controlled  Anemia secondary to ABL- follow  Nausea- mild ileus- continue reglan  DVT prophylaxis- continue enoxaparin + SCD   LOS: 2 days    [name] 05/05/2015

## 2015-05-05 NOTE — Progress Notes (Signed)
Patient ID: [name], male   DOB: 1950-09-27, 64 y.o.   MRN: [id number]  SICU Evening Rounds:  Hemodynamically stable Atrial fib 70's on po amio  Urine output ok  Stable day.

## 2015-05-06 ENCOUNTER — Inpatient Hospital Stay (HOSPITAL_COMMUNITY): Payer: 59

## 2015-05-06 LAB — BASIC METABOLIC PANEL
Anion gap: 9 (ref 5–15)
BUN: 17 mg/dL (ref 6–20)
CHLORIDE: 99 mmol/L — AB (ref 101–111)
CO2: 25 mmol/L (ref 22–32)
Calcium: 8 mg/dL — ABNORMAL LOW (ref 8.9–10.3)
Creatinine, Ser: 0.84 mg/dL (ref 0.61–1.24)
GFR calc Af Amer: >60 mL/min (ref >60)
GFR calc non Af Amer: >60 mL/min (ref >60)
GLUCOSE: 151 mg/dL — AB (ref 65–99)
POTASSIUM: 4.2 mmol/L (ref 3.5–5.1)
Sodium: 133 mmol/L — ABNORMAL LOW (ref 135–145)

## 2015-05-06 LAB — CBC
HCT: 27.4 % — ABNORMAL LOW (ref 39.0–52.0)
HEMOGLOBIN: 8.9 g/dL — AB (ref 13.0–17.0)
MCH: 26.7 pg (ref 26.0–34.0)
MCHC: 32.5 g/dL (ref 30.0–36.0)
MCV: 82.3 fL (ref 78.0–100.0)
Platelets: 187 10*3/uL (ref 150–400)
RBC: 3.33 MIL/uL — AB (ref 4.22–5.81)
RDW: 16 % — ABNORMAL HIGH (ref 11.5–15.5)
WBC: 11.3 10*3/uL — ABNORMAL HIGH (ref 4.0–10.5)

## 2015-05-06 LAB — GLUCOSE, CAPILLARY
Glucose-Capillary: 140 mg/dL — ABNORMAL HIGH (ref 65–99)
Glucose-Capillary: 159 mg/dL — ABNORMAL HIGH (ref 65–99)
Glucose-Capillary: 132 mg/dL — ABNORMAL HIGH (ref 65–99)
Glucose-Capillary: 123 mg/dL — ABNORMAL HIGH (ref 65–99)

## 2015-05-06 MED ORDER — POTASSIUM CHLORIDE CRYS ER 20 MEQ PO TBCR
20.0000 meq | EXTENDED_RELEASE_TABLET | Freq: Two times a day (BID) | ORAL | Status: DC
  Administered 2015-05-06 – 2015-05-10 (×9): 20 meq via ORAL
  Filled 2015-05-06 (×9): qty 1

## 2015-05-06 MED ORDER — HYDROMORPHONE HCL 2 MG PO TABS
2.0000 mg | ORAL_TABLET | ORAL | Status: DC | PRN
  Administered 2015-05-06 – 2015-05-08 (×2): 2 mg via ORAL
  Filled 2015-05-06 (×2): qty 1

## 2015-05-06 MED ORDER — SODIUM CHLORIDE 0.9 % IJ SOLN
3.0000 mL | Freq: Two times a day (BID) | INTRAMUSCULAR | Status: DC
  Administered 2015-05-06 – 2015-05-09 (×7): 3 mL via INTRAVENOUS

## 2015-05-06 MED ORDER — ALUM & MAG HYDROXIDE-SIMETH 200-200-20 MG/5ML PO SUSP: 15.0000 mL | ORAL | Status: DC | PRN

## 2015-05-06 MED ORDER — FUROSEMIDE 40 MG PO TABS
40.0000 mg | ORAL_TABLET | Freq: Every day | ORAL | Status: DC
  Administered 2015-05-06 – 2015-05-10 (×5): 40 mg via ORAL
  Filled 2015-05-06 (×5): qty 1

## 2015-05-06 MED ORDER — MOVING RIGHT ALONG BOOK
Freq: Once | Status: AC
  Administered 2015-05-06: 13:00:00
  Filled 2015-05-06: qty 1

## 2015-05-06 MED ORDER — SODIUM CHLORIDE 0.9 % IV SOLN: 250.0000 mL | INTRAVENOUS | Status: DC | PRN

## 2015-05-06 MED ORDER — MAGNESIUM HYDROXIDE 400 MG/5ML PO SUSP
30.0000 mL | Freq: Every day | ORAL | Status: DC | PRN
  Administered 2015-05-08: 30 mL via ORAL
  Filled 2015-05-06: qty 30

## 2015-05-06 MED ORDER — GUAIFENESIN-DM 100-10 MG/5ML PO SYRP: 15.0000 mL | ORAL_SOLUTION | ORAL | Status: DC | PRN

## 2015-05-06 MED ORDER — ZOLPIDEM TARTRATE 5 MG PO TABS: 5.0000 mg | ORAL_TABLET | Freq: Every evening | ORAL | Status: DC | PRN

## 2015-05-06 MED ORDER — METOCLOPRAMIDE HCL 5 MG/ML IJ SOLN
10.0000 mg | Freq: Four times a day (QID) | INTRAMUSCULAR | Status: DC
  Administered 2015-05-06 (×2): 10 mg via INTRAVENOUS
  Filled 2015-05-06 (×2): qty 2

## 2015-05-06 MED ORDER — SODIUM CHLORIDE 0.9 % IJ SOLN: 3.0000 mL | INTRAMUSCULAR | Status: DC | PRN

## 2015-05-06 MED ORDER — ALPRAZOLAM 0.25 MG PO TABS: 0.2500 mg | ORAL_TABLET | Freq: Four times a day (QID) | ORAL | Status: DC | PRN

## 2015-05-06 NOTE — Progress Notes (Signed)
Patient tx to 2W19; chart and meds given to secretary; patient belongings at bedside along with SCDs; patient placed on telemetry; currently in a-flutter 70s-80s; wires attached to pacer on back up VVI 70; receiving RN aware and at bedside; family outside room; no further questions.  Rowe Pavy, RN

## 2015-05-06 NOTE — Progress Notes (Signed)
3 Days Post-Op Procedure(s) (LRB): CORONARY ARTERY BYPASS GRAFTING (CABG) times four using left internal mammary artery and right saphenous vein. (N/A) TRANSESOPHAGEAL ECHOCARDIOGRAM (TEE) (N/A) Subjective: Still c/o nausea No emesis, + flatus  Objective: Vital signs in last 24 hours: Temp:  [97.9 F (36.6 C)-99.6 F (37.6 C)] 98.5 F (36.9 C) (09/29 0400) Pulse Rate:  [48-117] 96 (09/29 0700) Cardiac Rhythm:  [-] Atrial fibrillation (09/29 0400) Resp:  [13-25] 20 (09/29 0700) BP: (86-129)/(51-76) 106/64 mmHg (09/29 0700) SpO2:  [90 %-99 %] 97 % (09/29 0700) Weight:  [205 lb 14.6 oz (93.4 kg)] 205 lb 14.6 oz (93.4 kg) (09/29 0500)  Hemodynamic parameters for last 24 hours:    Intake/Output from previous day: 09/28 0701 - 09/29 0700 In: 1390 [P.O.:780; I.V.:460; IV Piggyback:150] Out: 1050 [Urine:1050] Intake/Output this shift:    General appearance: alert, cooperative and no distress Neurologic: intact Heart: irregularly irregular rhythm Lungs: diminished breath sounds bibasilar Abdomen: normal findings: soft, non-tender Wound: clean and dry  Lab Results:  Recent Labs  05/05/15 0417 05/06/15 0714  WBC 9.5 11.3*  HGB 8.8* 8.9*  HCT 27.5* 27.4*  PLT 162 187   BMET:  Recent Labs  05/04/15 0242  05/04/15 1642 05/05/15 0417  NA 134*  --  136 135  K 3.9  --  4.2 4.1  CL 103  --  100* 100*  CO2 24  --   --  29  GLUCOSE 128*  --  133* 134*  BUN 12  --  12 11  CREATININE 0.88  < > 0.80 0.95  CALCIUM 7.4*  --   --  7.9*  < > = values in this interval not displayed.  PT/INR:  Recent Labs  05/03/15 2042  LABPROT 18.9*  INR 1.58*   ABG    Component Value Date/Time   PHART 7.429 05/04/2015 0252   HCO3 27.9* 05/04/2015 0252   TCO2 24 05/04/2015 1642   ACIDBASEDEF 4.0* 05/03/2015 2033   O2SAT 96.0 05/04/2015 0252   CBG (last 3)   Recent Labs  05/05/15 1218 05/05/15 1620 05/05/15 2144  GLUCAP 159* 170* 149*    Assessment/Plan: S/P Procedure(s)  (LRB): CORONARY ARTERY BYPASS GRAFTING (CABG) times four using left internal mammary artery and right saphenous vein. (N/A) TRANSESOPHAGEAL ECHOCARDIOGRAM (TEE) (N/A) Plan for transfer to step-down: see transfer orders   CV- still in rate controlled atrial fib, continue amiodarone  Will likely need anticoagulation, continue enoxaparin for now, possibly start NOAC tomorrow  RESP- bibasilar atelectasis- IS  RENAL- no labs this AM, continue diuresis  ENDO- CBG better controlled  Anemia- follow  NAUSEA- persists- will dc oxycodone and try dilaudid, limit narcotic use as much as possible  Nausea preceded amiodarone, but may have to dc if other measures fail  SCD + enoxaparin for DVT prophylaxis     LOS: 3 days    [name] 05/06/2015

## 2015-05-07 DIAGNOSIS — Z736 Limitation of activities due to disability: Secondary | ICD-10-CM

## 2015-05-07 LAB — CBC
HEMATOCRIT: 31.1 % — AB (ref 39.0–52.0)
Hemoglobin: 9.8 g/dL — ABNORMAL LOW (ref 13.0–17.0)
MCH: 26 pg (ref 26.0–34.0)
MCHC: 31.5 g/dL (ref 30.0–36.0)
MCV: 82.5 fL (ref 78.0–100.0)
PLATELETS: 282 10*3/uL (ref 150–400)
RBC: 3.77 MIL/uL — ABNORMAL LOW (ref 4.22–5.81)
RDW: 15.9 % — AB (ref 11.5–15.5)
WBC: 13.3 10*3/uL — AB (ref 4.0–10.5)

## 2015-05-07 LAB — BASIC METABOLIC PANEL
ANION GAP: 11 (ref 5–15)
BUN: 15 mg/dL (ref 6–20)
CALCIUM: 8.4 mg/dL — AB (ref 8.9–10.3)
CO2: 28 mmol/L (ref 22–32)
CREATININE: 0.93 mg/dL (ref 0.61–1.24)
Chloride: 97 mmol/L — ABNORMAL LOW (ref 101–111)
Glucose, Bld: 132 mg/dL — ABNORMAL HIGH (ref 65–99)
Potassium: 4 mmol/L (ref 3.5–5.1)
SODIUM: 136 mmol/L (ref 135–145)

## 2015-05-07 LAB — GLUCOSE, CAPILLARY
Glucose-Capillary: 136 mg/dL — ABNORMAL HIGH (ref 65–99)
Glucose-Capillary: 130 mg/dL — ABNORMAL HIGH (ref 65–99)
Glucose-Capillary: 119 mg/dL — ABNORMAL HIGH (ref 65–99)

## 2015-05-07 MED ORDER — ATORVASTATIN CALCIUM 20 MG PO TABS
20.0000 mg | ORAL_TABLET | Freq: Every day | ORAL | Status: DC
  Administered 2015-05-07 – 2015-05-09 (×3): 20 mg via ORAL
  Filled 2015-05-07 (×3): qty 1

## 2015-05-07 MED ORDER — WARFARIN SODIUM 5 MG PO TABS
5.0000 mg | ORAL_TABLET | Freq: Every day | ORAL | Status: DC
  Administered 2015-05-07 – 2015-05-09 (×3): 5 mg via ORAL
  Filled 2015-05-07 (×3): qty 1

## 2015-05-07 MED ORDER — WARFARIN - PHYSICIAN DOSING INPATIENT
Freq: Every day | Status: DC
  Administered 2015-05-07 – 2015-05-09 (×2)

## 2015-05-07 MED ORDER — LISINOPRIL 2.5 MG PO TABS
2.5000 mg | ORAL_TABLET | Freq: Every day | ORAL | Status: DC
  Administered 2015-05-07 – 2015-05-08 (×2): 2.5 mg via ORAL
  Filled 2015-05-07 (×2): qty 1

## 2015-05-07 NOTE — Progress Notes (Signed)
CARDIAC REHAB PHASE I   PRE:  Rate/Rhythm: 78 Aflutter  BP:  Supine:   Sitting: 127/65  Standing:    SaO2: 90-92% 3L  MODE:  Ambulation: 350 ft   POST:  Rate/Rhythm: 75-81 aflutter  BP:  Supine:   Sitting: 146/75  Standing:    SaO2: 97 % 2L  Left on 2L 1000-1045 Pt walked 350 ft on 2L with rolling walker and asst x 1 with steady gait. Stopped once to rest. Sats better on 2L with walk so left on 2L. Could walk on RA next walk to see how pt tolerates. Using IS also. To recliner with call bell. Wife in room. Put extension on oxygen.    Graylon Good, RN BSN  05/07/2015 10:41 AM

## 2015-05-07 NOTE — Progress Notes (Signed)
UR Completed. Tyshia Fenter, RN, BSN.  [phone] 

## 2015-05-07 NOTE — Care Management Note (Signed)
Case Management Note  Patient Details  Name: [name] MRN: [id number] Date of Birth: 03-31-1951  Subjective/Objective:   Pt lives with spouse who will be available for 24/7 assistance when he is medically stable for discharge.  Adult son and dtr will also provide assistance.  Assisted spouse with documentation needed for reimbursement from [facility] insurance policy per request.                             Expected Discharge Plan:  [place]  Discharge planning Services  CM Consult  Status of Service:  In process, will continue to follow   05/07/2015 Pt transferred from ICU to [place] 05/06/15.  CM will continue to monitor for disposition needs  Maryclare Labrador, RN 05/07/2015, 3:30 PM

## 2015-05-07 NOTE — Discharge Summary (Signed)
Physician Discharge Summary  Patient ID: [name] MRN: [id number] DOB/AGE: 1951-03-05 64 y.o y.o.  Admit date: 05/03/2015 Discharge date: 05/09/2015  Admission Diagnoses:  Patient Active Problem List   Diagnosis Date Noted  . ST elevation myocardial infarction (STEMI) of inferior wall 05/03/2015  . CAD (coronary artery disease) 05/03/2015   Discharge Diagnoses:   Patient Active Problem List   Diagnosis Date Noted  . ST elevation myocardial infarction (STEMI) of inferior wall ([place]) 05/03/2015  . CAD (coronary artery disease) 05/03/2015  . S/P CABG x 4 05/03/2015   Discharged Condition: good  History of Present Illness:  [name] is a 64 yo white male with known history of CAD, HTN, HLD, and kidney stones.  He presented to [facility] on 05/03/2015 with complaints of chest pain.  He stated his symptoms originally started on 04/30/2015.  He has been followed by Dr. Clayborn Bigness who performed a nuclear stress test in 01/2015 which showed no significant ischemia and normal LV activity with a preserved EF of 55%.   Work up in ED showed EKG with ST changes with reciprocal changes.  His enzymes were also elevated.  It was felt he was likely suffering an acute STEMI and Cardiology consult was obtained.  He was evaluated by Dr. Fletcher Anon who took patient to the cath lab.  This showed severe CAD which was felt to require emergent intervention.  Dr. Roxan Hockey was consulted and accepted patient for emergent transfer for bypass procedure.    Hospital Course:   Upon arrival to [facility] he was taken to the operating room.  He underwent CABG x 4 utilizing LIMA to LAD, SVG to Diagonal, SVG to OM2, and SVG to PL.  He also underwent endoscopic harvest of the greater saphenous vein from right leg.  He tolerated the procedure and was taken to the SICU in stable condition.  During his stay in the SICU the patient was weaned and extubated.  He had developed complete heart block prior to surgery.  He remained  in complete heart block post operatively, requiring temporary pacing.  He later converted to Atrial fibrillation with a controlled rate.  He was placed on Amiodarone.  He had a small right sided pneumothorax on CXR which remained stable and did not require acute intervention.  He continued to have rate controlled Atrial Fibrillation and he was started on anticoagulation for stroke prevention.  He developed symptoms consistent with Ileus.  He was started on Reglan.  He was felt to be medically stable for transfer to the step down unit on POD # 3.  The patient continues to make progress.  He remains in A. Flutter with a 3:1 conduction block.  Rapid Atrial Pacing was performed at bedside and the patient converted to NSR. EP consult has been obtained. And they recommended continuing Amiodarone and Coumadin.  His nausea has resolved and he has moved his bowels.  His pacing wires have been removed without difficulty.  He is ambulating independently.  He is tolerating a heart healthy diet.  He is felt to be medically stable for discharge 05/10/2015.         Significant Diagnostic Studies: Cardiac Catheterization   Prox RCA lesion, 80% stenosed.  Mid LAD lesion, 40% stenosed.  2nd Diag lesion, 90% stenosed.  1st Mrg lesion, 60% stenosed.  Mid Cx lesion, 90% stenosed.  Ost LAD to Prox LAD lesion, 70% stenosed.  1st Diag lesion, 90% stenosed.  2nd Mrg lesion, 50% stenosed.  There is moderate left ventricular  systolic dysfunction.  Mid RCA to Dist RCA lesion, 100% stenosed. There is a 80% residual stenosis post intervention.  Treatments: surgery:   Median sternotomy; extracorporeal circulation; coronary artery bypass grafting x4 (left internal mammary artery to left anterior descending, saphenous vein graft to first diagonal, saphenous vein graft to obtuse marginal 2, saphenous vein graft to posterior lateral); endoscopic vein harvest, right leg.  Disposition: Home  Discharge medications:  The  patient has been discharged on:   1.Beta Blocker:  Yes [   ]                              No   [ x  ]                              If No, reason: Heart Block  2.Ace Inhibitor/ARB: Yes [ x  ]                                     No  [    ]                                     If No, reason:  3.Statin:   Yes [ x  ]                  No  [   ]                  If No, reason:  4.Ecasa:  Yes  [ x  ]                  No   [   ]                  If No, reason:      Discharge Instructions    Amb Referral to Cardiac Rehabilitation    Complete by:  As directed   Congestive Heart Failure: If diagnosis is Heart Failure, patient MUST meet each of the CMS criteria: 1. Left Ventricular Ejection Fraction </= 35% 2. NYHA class II-IV symptoms despite being on optimal heart failure therapy for at least 6 weeks. 3. Stable = have not had a recent (<6 weeks) or planned (<6 months) major cardiovascular hospitalization or procedure  Program Details: - Physician supervised classes - 1-3 classes per week over a 12-18 week period, generally for a total of 36 sessions  Physician Certification: I certify that the above Cardiac Rehabilitation treatment is medically necessary and is medically approved by me for treatment of this patient. The patient is willing and cooperative, able to ambulate and medically stable to participate in exercise rehabilitation. The participant's progress and Individualized Treatment Plan will be reviewed by the Medical Director, Cardiac Rehab staff and as indicated by the Referring/Ordering Physician.  Diagnosis:   CABG Myocardial Infarction              Medication List    STOP taking these medications        oxyCODONE-acetaminophen 5-325 MG tablet  Commonly known as:  ROXICET      TAKE these medications        amiodarone 200 MG tablet  Commonly known as:  PACERONE  Take 1 tablet (200 mg total) by mouth 2 (two) times daily.     aspirin 325 MG EC tablet  Take 1  tablet (325 mg total) by mouth daily.     atorvastatin 20 MG tablet  Commonly known as:  LIPITOR  Take 1 tablet (20 mg total) by mouth daily at 6 PM.     ENSURE PLUS Liqd  Take 237 mLs by mouth daily.     furosemide 40 MG tablet  Commonly known as:  LASIX  Take 1 tablet (40 mg total) by mouth daily. For 7 Days     guaiFENesin-dextromethorphan 100-10 MG/5ML syrup  Commonly known as:  ROBITUSSIN DM  Take 15 mLs by mouth every 4 (four) hours as needed for cough.     lisinopril 10 MG tablet  Commonly known as:  PRINIVIL,ZESTRIL  Take 1 tablet (10 mg total) by mouth daily.     ondansetron 4 MG disintegrating tablet  Commonly known as:  ZOFRAN ODT  Take 1 tablet (4 mg total) by mouth every 8 (eight) hours as needed for nausea or vomiting.     potassium chloride SA 20 MEQ tablet  Commonly known as:  K-DUR,KLOR-CON  Take 1 tablet (20 mEq total) by mouth daily. For 7 days     tamsulosin 0.4 MG Caps capsule  Commonly known as:  FLOMAX  Take 1 capsule (0.4 mg total) by mouth daily.     traMADol 50 MG tablet  Commonly known as:  ULTRAM  Take 1-2 tablets (50-100 mg total) by mouth every 4 (four) hours as needed for moderate pain.     warfarin 5 MG tablet  Commonly known as:  COUMADIN  Take 1 tablet (5 mg total) by mouth daily at 6 PM.        Follow-up Information    Follow up with [name], MD On 06/08/2015.   Specialty:  Cardiothoracic Surgery   Why:  Appoinment is at 11:30   Contact information:   [place] [address] [phone]       Follow up with [facility] On 06/08/2015.   Why:  please get a CXR at 11:00   Contact information:   [place]       Follow up with [facility].   Specialty:  Cardiology   Contact information:   [address], [place] [phone]      Follow up with Kathlyn Sacramento, MD.   Specialty:  Cardiology   Contact information:    [place] [address] [phone]       Signed: Ellwood Handler 05/09/2015, 7:55 AM

## 2015-05-07 NOTE — Progress Notes (Addendum)
       [address]       [place] [address]             [phone]          4 Days Post-Op Procedure(s) (LRB): CORONARY ARTERY BYPASS GRAFTING (CABG) times four using left internal mammary artery and right saphenous vein. (N/A) TRANSESOPHAGEAL ECHOCARDIOGRAM (TEE) (N/A)  Subjective: Feels much better today. Had 3 BMs and nausea resolved. Breathing stable, coughing up a fair amount of clear drainage.   Objective: Vital signs in last 24 hours: Patient Vitals for the past 24 hrs:  BP Temp Temp src Pulse Resp SpO2 Weight  05/07/15 0424 (!) 149/72 mmHg 97.7 F (36.5 C) Oral 76 20 94 % 202 lb 4.8 oz (91.763 kg)  05/06/15 2216 121/64 mmHg 99.2 F (37.3 C) Oral 95 20 94 % -  05/06/15 1800 123/64 mmHg - - 91 (!) 27 96 % -  05/06/15 1700 127/68 mmHg - - 89 (!) 27 97 % -  05/06/15 1654 - 98.3 F (36.8 C) Oral - - - -  05/06/15 1500 111/64 mmHg - - 69 (!) 23 96 % -  05/06/15 1400 (!) 111/46 mmHg - - 91 20 91 % -  05/06/15 1300 119/63 mmHg - - 92 (!) 28 95 % -  05/06/15 1229 - 97.9 F (36.6 C) Oral - - - -  05/06/15 1200 139/63 mmHg - - - (!) 26 - -  05/06/15 1100 (!) 123/56 mmHg - - 93 18 97 % -  05/06/15 1000 (!) 153/72 mmHg - - 71 (!) 26 98 % -  05/06/15 0900 122/69 mmHg - - - (!) 27 96 % -   Current Weight  05/07/15 202 lb 4.8 oz (91.763 kg)     Intake/Output from previous day: 09/29 0701 - 09/30 0700 In: 260 [P.O.:240; I.V.:20] Out: 475 [Urine:475]  CBGs [phone]   PHYSICAL EXAM:  Heart: Irr Irr Lungs: Diminished  BS in bases Wound: Clean and dry Extremities: +LE edema    Lab Results: CBC: Recent Labs  05/06/15 0714 05/07/15 0555  WBC 11.3* 13.3*  HGB 8.9* 9.8*  HCT 27.4* 31.1*  PLT 187 282   BMET:  Recent Labs  05/06/15 0714 05/07/15 0555  NA 133* 136  K 4.2 4.0  CL 99* 97*  CO2 25 28  GLUCOSE 151* 132*  BUN 17 15  CREATININE 0.84 0.93  CALCIUM 8.0* 8.4*    PT/INR: No results for input(s): LABPROT, INR in the last  72 hours.    Assessment/Plan: S/P Procedure(s) (LRB): CORONARY ARTERY BYPASS GRAFTING (CABG) times four using left internal mammary artery and right saphenous vein. (N/A) TRANSESOPHAGEAL ECHOCARDIOGRAM (TEE) (N/A)  CV- in atrial flutter this am, rates in 80-90s. Continue Amio, not on beta blocker yet due to prior CHB. May need NOAC. BPs trending up, creatinine stable. Will start low dose ACE-I.  Vol overload- diurese.  WBC up slightly this am, no fever.  Will watch. Likely atelectasis.  Pulm- Continue pulm toilet. Wean O2 as tolerated.   LOS: 4 days    COLLINS,GINA H 05/07/2015   Patient seen and examined, agree with above He is in flutter with 3:1 block, rate controlled in the 80s Will continue amiodarone At this point he needs anticoagulation- i will start coumadin today I have asked Cardiology to see him to see if they have any other recommendations  Remo Lipps C. Roxan Hockey, MD [facility] [phone]

## 2015-05-08 DIAGNOSIS — I25118 Atherosclerotic heart disease of native coronary artery with other forms of angina pectoris: Secondary | ICD-10-CM

## 2015-05-08 LAB — GLUCOSE, CAPILLARY
Glucose-Capillary: 141 mg/dL — ABNORMAL HIGH (ref 65–99)
Glucose-Capillary: 126 mg/dL — ABNORMAL HIGH (ref 65–99)

## 2015-05-08 LAB — CBC
HCT: 26.9 % — ABNORMAL LOW (ref 39.0–52.0)
Hemoglobin: 8.8 g/dL — ABNORMAL LOW (ref 13.0–17.0)
MCH: 26.9 pg (ref 26.0–34.0)
MCHC: 32.7 g/dL (ref 30.0–36.0)
MCV: 82.3 fL (ref 78.0–100.0)
PLATELETS: 308 10*3/uL (ref 150–400)
RBC: 3.27 MIL/uL — AB (ref 4.22–5.81)
RDW: 16.1 % — ABNORMAL HIGH (ref 11.5–15.5)
WBC: 10 10*3/uL (ref 4.0–10.5)

## 2015-05-08 LAB — PROTIME-INR
INR: 1.28 (ref 0.00–1.49)
PROTHROMBIN TIME: 16.1 s — AB (ref 11.6–15.2)

## 2015-05-08 MED ORDER — COUMADIN BOOK
Freq: Once | Status: AC
  Administered 2015-05-08: 14:00:00
  Filled 2015-05-08: qty 1

## 2015-05-08 NOTE — Progress Notes (Signed)
Pt ambulated 350 ft with rolling walker on room air.  Pt tolerated well.

## 2015-05-08 NOTE — Progress Notes (Signed)
CARDIAC REHAB PHASE I   Patient observed ambulating in hallway with wife using RW on room air. Steady gait note. Spot check O2 96%. Post ambulation patient and wife back to room with Dr. Roxy Manns at bedside. Patient remained in [place] during ambulation. Dr. Roxy Manns rapid Apaced patient back to NSR. Pt tol well. Discharge Education completed with wife and patient post procedure. OHS book reviewed and handouts given on nutrition and activity progression. Patient requested order to be placed for phase 2 cardiac rehab at [facility]. Referral letter will be sent. Patient denied any questions regarding discharge information. Encouraged patient to walk 2 more times today. Patient verbalized understanding. Will follow up on Monday if patient not discharged tomorrow.  Santina Evans, BSN 05/08/2015 12:43 PM

## 2015-05-08 NOTE — Consult Note (Signed)
Primary Care Physician: Ralph Huh, MD Referring Physician:  Admit Date: 05/03/2015  Reason for consultation: atrial flutter  [name] is a 64 y.o. male with a h/o neck surgery, hip replacement, hypertension, hyperlipidemia, nephrolithiasis and stage 2 CKD.  He is currently POD 5 from CABG.  Post op he was in both atrial fibrillation and atrial flutter.  He has converted to sinus rhythm today.  He is currently on amiodarone and warfarin.    Today, he denies symptoms of palpitations, chest pain, shortness of breath, orthopnea, PND, lower extremity edema, dizziness, presyncope, syncope, or neurologic sequela. The patient is tolerating medications without difficulties and is otherwise without complaint today.   Past Medical History  Diagnosis Date  . Kidney stones   . Coronary atherosclerosis     a. CT scan 01/02/2015: extensive coronary atherosclerosis   . HTN (hypertension)   . HLD (hyperlipidemia)    Past Surgical History  Procedure Laterality Date  . Neck surgery    . Hernia repair    . Right hip replacement    . Joint replacement    . Cardiac catheterization N/A 05/03/2015    Procedure: Left Heart Cath and Coronary Angiography;  Surgeon: Ralph Hampshire, MD;  Location: [facility];  Service: Cardiovascular;  Laterality: N/A;  . Coronary artery bypass graft N/A 05/03/2015    Procedure: CORONARY ARTERY BYPASS GRAFTING (CABG) times four using left internal mammary artery and right saphenous vein.;  Surgeon: Ralph Nakayama, MD;  Location: [facility];  Service: Open Heart Surgery;  Laterality: N/A;  . Tee without cardioversion N/A 05/03/2015    Procedure: TRANSESOPHAGEAL ECHOCARDIOGRAM (TEE);  Surgeon: Ralph Nakayama, MD;  Location: [facility];  Service: Open Heart Surgery;  Laterality: N/A;    . acetaminophen  1,000 mg Oral 4 times per day  . amiodarone  400 mg Oral BID  . aspirin EC  325 mg Oral Daily  . atorvastatin  20 mg Oral q1800  . bisacodyl  10 mg Oral  Daily   Or  . bisacodyl  10 mg Rectal Daily  . Chlorhexidine Gluconate Cloth  6 each Topical Q0600  . docusate sodium  200 mg Oral Daily  . enoxaparin (LOVENOX) injection  40 mg Subcutaneous QHS  . furosemide  40 mg Oral Daily  . lisinopril  2.5 mg Oral Daily  . pantoprazole  40 mg Oral Daily  . potassium chloride  20 mEq Oral BID  . sodium chloride  3 mL Intravenous Q12H  . tamsulosin  0.4 mg Oral QPC supper  . warfarin  5 mg Oral q1800  . Warfarin - Physician Dosing Inpatient   Does not apply q1800      No Known Allergies  Social History   Social History  . Marital Status: Married    Spouse Name: N/A  . Number of Children: N/A  . Years of Education: N/A   Occupational History  . Not on file.   Social History Main Topics  . Smoking status: Never Smoker   . Smokeless tobacco: Not on file  . Alcohol Use: 0.0 oz/week    0 Standard drinks or equivalent per week  . Drug Use: No  . Sexual Activity: Yes    Birth Control/ Protection: None   Other Topics Concern  . Not on file   Social History Narrative    Family History  Problem Relation Age of Onset  . CAD Father   . CAD Maternal Uncle   . CAD Paternal  Uncle     ROS- All systems are reviewed and negative except as per the HPI above  Physical Exam: Telemetry: Filed Vitals:   05/07/15 0424 05/07/15 1344 05/07/15 2009 05/08/15 0509  BP: 149/72 120/61 108/62 123/66  Pulse: 76 70 86 49  Temp: 97.7 F (36.5 C) 97.8 F (36.6 C) 98.4 F (36.9 C) 98.1 F (36.7 C)  TempSrc: Oral Oral Oral Oral  Resp: 20 18 18 18   Height:      Weight: 202 lb 4.8 oz (91.763 kg)   200 lb 13.4 oz (91.1 kg)  SpO2: 94% 89% 90% 89%    GEN- The patient is well appearing, alert and oriented x 3 today.   Head- normocephalic, atraumatic Eyes-  Sclera clear, conjunctiva pink Ears- hearing intact Oropharynx- clear Neck- supple, no JVP Lymph- no cervical lymphadenopathy Lungs- Clear to ausculation bilaterally, normal work of  breathing Heart- Regular rate and rhythm, no murmurs, rubs or gallops, PMI not laterally displaced GI- soft, NT, ND, + BS Extremities- no clubbing, cyanosis, or edema MS- no significant deformity or atrophy Skin- no rash or lesion Psych- euthymic mood, full affect Neuro- strength and sensation are intact  EKG-  Labs:   Lab Results  Component Value Date   WBC 10.0 05/08/2015   HGB 8.8* 05/08/2015   HCT 26.9* 05/08/2015   MCV 82.3 05/08/2015   PLT 308 05/08/2015    Recent Labs Lab 05/07/15 0555  NA 136  K 4.0  CL 97*  CO2 28  BUN 15  CREATININE 0.93  CALCIUM 8.4*  GLUCOSE 132*   Lab Results  Component Value Date   TROPONINI 7.01* 05/03/2015    Lab Results  Component Value Date   CHOL 230* 05/03/2015   Lab Results  Component Value Date   HDL 30* 05/03/2015   Lab Results  Component Value Date   LDLCALC 168* 05/03/2015   Lab Results  Component Value Date   TRIG 162* 05/03/2015   Lab Results  Component Value Date   CHOLHDL 7.7 05/03/2015   No results found for: LDLDIRECT    Radiology:CXR 9/29 FINDINGS: Right pneumothorax no longer visualized.  Right jugular central venous catheter tip in the right innominate vein unchanged.  Hypoventilation with bibasilar atelectasis. Increase in bibasilar atelectasis since yesterday. Negative for edema  IMPRESSION: No pneumothorax identified on today's study.  Increase in bibasilar atelectasis compared with yesterday.  ASSESSMENT AND PLAN:  Atrial fibrillation: currently feeling well without complaint.  Converted to sinus rhythm today from atrial flutter.  On amiodarone and warfarin currently.  Would continue amiodarone as in normal rhythm and warfarin for CVA prophylaxis. Agree with holding beta blocker while on amoidarone for now, if no further signs of heart block, may benefit from beta blocker at discharge.  If he has further rhythm issues, please do not hesitate to call cardiology.  Ralph Castrogiovanni Meredith Leeds,  MD 05/08/2015  12:57 PM

## 2015-05-08 NOTE — Progress Notes (Addendum)
      [address]       ,[place] [address]             [phone]      5 Days Post-Op Procedure(s) (LRB): CORONARY ARTERY BYPASS GRAFTING (CABG) times four using left internal mammary artery and right saphenous vein. (N/A) TRANSESOPHAGEAL ECHOCARDIOGRAM (TEE) (N/A)   Subjective:  [name] has no complaints.  States he feels pretty good considering the circumstances.  + ambulation  + BM  Objective: Vital signs in last 24 hours: Temp:  [97.8 F (36.6 C)-98.4 F (36.9 C)] 98.1 F (36.7 C) (10/01 0509) Pulse Rate:  [49-86] 49 (10/01 0509) Cardiac Rhythm:  [-] Atrial flutter (09/30 2009) Resp:  [18] 18 (10/01 0509) BP: (108-123)/(61-66) 123/66 mmHg (10/01 0509) SpO2:  [89 %-90 %] 89 % (10/01 0509) Weight:  [200 lb 13.4 oz (91.1 kg)] 200 lb 13.4 oz (91.1 kg) (10/01 0509)  Intake/Output from previous day: 09/30 0701 - 10/01 0700 In: 440 [P.O.:440] Out: -   General appearance: alert, cooperative and no distress Heart: irregularly irregular rhythm Lungs: clear to auscultation bilaterally Abdomen: soft, non-tender; bowel sounds normal; no masses,  no organomegaly Extremities: edema trace Wound: clean and dry  Lab Results:  Recent Labs  05/07/15 0555 05/08/15 0515  WBC 13.3* 10.0  HGB 9.8* 8.8*  HCT 31.1* 26.9*  PLT 282 308   BMET:  Recent Labs  05/06/15 0714 05/07/15 0555  NA 133* 136  K 4.2 4.0  CL 99* 97*  CO2 25 28  GLUCOSE 151* 132*  BUN 17 15  CREATININE 0.84 0.93  CALCIUM 8.0* 8.4*    PT/INR:  Recent Labs  05/08/15 0515  LABPROT 16.1*  INR 1.28   ABG    Component Value Date/Time   PHART 7.429 05/04/2015 0252   HCO3 27.9* 05/04/2015 0252   TCO2 24 05/04/2015 1642   ACIDBASEDEF 4.0* 05/03/2015 2033   O2SAT 96.0 05/04/2015 0252   CBG (last 3)   Recent Labs  05/07/15 1627 05/07/15 2107 05/08/15 0613  GLUCAP 119* 141* 126*    Assessment/Plan: S/P Procedure(s) (LRB): CORONARY ARTERY BYPASS GRAFTING (CABG) times  four using left internal mammary artery and right saphenous vein. (N/A) TRANSESOPHAGEAL ECHOCARDIOGRAM (TEE) (N/A)  1.CV- A. Flutter rate mainly 80-90, dropped into upper 40s at one point- continue Amiodarone, will continue to hold Beta Blocker due to previous CHB- Cardiology asked to evaluate 2. Pulm- no acute issues, continue IS 3. INR 1.28, repeat 5 mg coumadin tonight 4. Renal- creatinine has been stable, remains hypervolemic continue lasix 5. CBGs controlled, patient is not a diabetic will d/c SSIP and glucose checks 6. Dispo- patient stable, continue Amiodarone, Coumadin, awaiting Cardiology input, continue current care   LOS: 5 days    [name], [name] 05/08/2015  I have seen and examined the patient and agree with the assessment and plan as outlined.  Has been maintaining stable rate-controlled Aflutter on oral amiodarone.  Coumadin started yesterday.  Remains on low dose lovenox.  Patient rapid atrial paced at bedside into NSR w/ 1st degree AV block.  Ralph Alberts, MD 05/08/2015 11:17 AM

## 2015-05-09 LAB — CBC
HCT: 30 % — ABNORMAL LOW (ref 39.0–52.0)
HEMOGLOBIN: 9.7 g/dL — AB (ref 13.0–17.0)
MCH: 26.6 pg (ref 26.0–34.0)
MCHC: 32.3 g/dL (ref 30.0–36.0)
MCV: 82.4 fL (ref 78.0–100.0)
Platelets: 364 10*3/uL (ref 150–400)
RBC: 3.64 MIL/uL — ABNORMAL LOW (ref 4.22–5.81)
RDW: 16.2 % — AB (ref 11.5–15.5)
WBC: 9.6 10*3/uL (ref 4.0–10.5)

## 2015-05-09 LAB — PROTIME-INR
INR: 1.32 (ref 0.00–1.49)
PROTHROMBIN TIME: 16.5 s — AB (ref 11.6–15.2)

## 2015-05-09 MED ORDER — LISINOPRIL 5 MG PO TABS: 5.0000 mg | ORAL_TABLET | Freq: Every day | ORAL | Status: DC

## 2015-05-09 MED ORDER — GUAIFENESIN-DM 100-10 MG/5ML PO SYRP: 15.0000 mL | ORAL_SOLUTION | ORAL | Status: DC | PRN

## 2015-05-09 MED ORDER — TRAMADOL HCL 50 MG PO TABS: 50.0000 mg | ORAL_TABLET | ORAL | Status: DC | PRN

## 2015-05-09 MED ORDER — POTASSIUM CHLORIDE CRYS ER 20 MEQ PO TBCR: 20.0000 meq | EXTENDED_RELEASE_TABLET | Freq: Every day | ORAL | Status: DC

## 2015-05-09 MED ORDER — WARFARIN SODIUM 5 MG PO TABS: 5.0000 mg | ORAL_TABLET | Freq: Every day | ORAL | Status: DC

## 2015-05-09 MED ORDER — FUROSEMIDE 40 MG PO TABS: 40.0000 mg | ORAL_TABLET | Freq: Every day | ORAL | Status: DC

## 2015-05-09 MED ORDER — ASPIRIN 325 MG PO TBEC: 325.0000 mg | DELAYED_RELEASE_TABLET | Freq: Every day | ORAL | Status: DC

## 2015-05-09 MED ORDER — ATORVASTATIN CALCIUM 20 MG PO TABS: 20.0000 mg | ORAL_TABLET | Freq: Every day | ORAL | Status: DC

## 2015-05-09 MED ORDER — LISINOPRIL 10 MG PO TABS
10.0000 mg | ORAL_TABLET | Freq: Every day | ORAL | Status: DC
  Administered 2015-05-10: 10 mg via ORAL
  Filled 2015-05-09: qty 1

## 2015-05-09 MED ORDER — AMIODARONE HCL 200 MG PO TABS: 400.0000 mg | ORAL_TABLET | Freq: Two times a day (BID) | ORAL | Status: DC

## 2015-05-09 MED ORDER — LISINOPRIL 5 MG PO TABS
5.0000 mg | ORAL_TABLET | Freq: Every day | ORAL | Status: DC
  Administered 2015-05-09: 5 mg via ORAL
  Filled 2015-05-09: qty 1

## 2015-05-09 NOTE — Discharge Instructions (Signed)
Coronary Artery Bypass Grafting, Care After Refer to this sheet in the next few weeks. These instructions provide you with information on caring for yourself after your procedure. Your health care provider may also give you more specific instructions. Your treatment has been planned according to current medical practices, but problems sometimes occur. Call your health care provider if you have any problems or questions after your procedure. WHAT TO EXPECT AFTER THE PROCEDURE Recovery from surgery will be different for everyone. Some people feel well after 3 or 4 weeks, while for others it takes longer. After your procedure, it is typical to have the following:  Nausea and a lack of appetite.   Constipation.  Weakness and fatigue.   Depression or irritability.   Pain or discomfort at your incision site. HOME CARE INSTRUCTIONS  Take medicines only as directed by your health care provider. Do not stop taking medicines or start any new medicines without first checking with your health care provider.  Take your pulse as directed by your health care provider.  Perform deep breathing as directed by your health care provider. If you were given a device called an incentive spirometer, use it to practice deep breathing several times a day. Support your chest with a pillow or your arms when you take deep breaths or cough.  Keep incision areas clean, dry, and protected. Remove or change any bandages (dressings) only as directed by your health care provider. You may have skin adhesive strips over the incision areas. Do not take the strips off. They will fall off on their own.  Check incision areas daily for any swelling, redness, or drainage.  If incisions were made in your legs, do the following:  Avoid crossing your legs.   Avoid sitting for long periods of time. Change positions every 30 minutes.   Elevate your legs when you are sitting.  Wear compression stockings as directed by your  health care provider. These stockings help keep blood clots from forming in your legs.  Take showers once your health care provider approves. Until then, only take sponge baths. Pat incisions dry. Do not rub incisions with a washcloth or towel. Do not take baths, swim, or use a hot tub until your health care provider approves.  Eat foods that are high in fiber, such as raw fruits and vegetables, whole grains, beans, and nuts. Meats should be lean cut. Avoid canned, processed, and fried foods.  Drink enough fluid to keep your urine clear or pale yellow.  Weigh yourself every day. This helps identify if you are retaining fluid that may make your heart and lungs work harder.  Rest and limit activity as directed by your health care provider. You may be instructed to:  Stop any activity at once if you have chest pain, shortness of breath, irregular heartbeats, or dizziness. Get help right away if you have any of these symptoms.  Move around frequently for short periods or take short walks as directed by your health care provider. Increase your activities gradually. You may need physical therapy or cardiac rehabilitation to help strengthen your muscles and build your endurance.  Avoid lifting, pushing, or pulling anything heavier than 10 lb (4.5 kg) for at least 6 weeks after surgery.  Do not drive until your health care provider approves.  Ask your health care provider when you may return to work.  Ask your health care provider when you may resume sexual activity.  Keep all follow-up visits as directed by your health care  provider. This is important. °SEEK MEDICAL CARE IF: °· You have swelling, redness, increasing pain, or drainage at the site of an incision. °· You have a fever. °· You have swelling in your ankles or legs. °· You have pain in your legs.   °· You gain 2 or more pounds (0.9 kg) a day. °· You are nauseous or vomit. °· You have diarrhea.  °SEEK IMMEDIATE MEDICAL CARE IF: °· You have  chest pain that goes to your jaw or arms. °· You have shortness of breath.   °· You have a fast or irregular heartbeat.   °· You notice a "clicking" in your breastbone (sternum) when you move.   °· You have numbness or weakness in your arms or legs. °· You feel dizzy or light-headed.   °MAKE SURE YOU: °· Understand these instructions. °· Will watch your condition. °· Will get help right away if you are not doing well or get worse. °Document Released: 02/10/2005 Document Revised: 12/08/2013 Document Reviewed: 12/31/2012 °[employer]® Patient Information ©2015 [employer]. This information is not intended to replace advice given to you by your health care provider. Make sure you discuss any questions you have with your health care provider. ° °Endoscopic Saphenous Vein Harvesting °Care After °Refer to this sheet in the next few weeks. These instructions provide you with information on caring for yourself after your procedure. Your health care provider may also give you more specific instructions. Your treatment has been planned according to current medical practices, but problems sometimes occur. Call your health care provider if you have any problems or questions after your procedure. °HOME CARE INSTRUCTIONS °Medicine °· Take whatever pain medicine your surgeon prescribes. Follow the directions carefully. Do not take over-the-counter pain medicine unless your surgeon says it is okay. Some pain medicine can cause bleeding problems for several weeks after surgery. °· Follow your surgeon's instructions about driving. You will probably not be permitted to drive after heart surgery. °· Take any medicines your surgeon prescribes. Any medicines you took before your heart surgery should be checked with your health care provider before you start taking them again. °Wound care °· If your surgeon has prescribed an elastic bandage or stocking, ask how long you should wear it. °· Check the area around your surgical cuts  (incisions) whenever your bandages (dressings) are changed. Look for any redness or swelling. °· You will need to return to have the stitches (sutures) or staples taken out. Ask your surgeon when to do that. °· Ask your surgeon when you can shower or bathe. °Activity °· Try to keep your legs raised when you are sitting. °· Do any exercises your health care providers have given you. These may include deep breathing exercises, coughing, walking, or other exercises. °SEEK MEDICAL CARE IF: °· You have any questions about your medicines. °· You have more leg pain, especially if your pain medicine stops working. °· New or growing bruises develop on your leg. °· Your leg swells, feels tight, or becomes red. °· You have numbness in your leg. °SEEK IMMEDIATE MEDICAL CARE IF: °· Your pain gets much worse. °· Blood or fluid leaks from any of the incisions. °· Your incisions become warm, swollen, or red. °· You have chest pain. °· You have trouble breathing. °· You have a fever. °· You have more pain near your leg incision. °MAKE SURE YOU: °· Understand these instructions. °· Will watch your condition. °· Will get help right away if you are not doing well or get worse. °Document Released: 04/05/2011   Document Revised: 07/29/2013 Document Reviewed: 04/05/2011 [employer] Patient Information 2015 [place], [place]. This information is not intended to replace advice given to you by your health care provider. Make sure you discuss any questions you have with your health care provider.  Vitamin K Foods and Warfarin Warfarin is a medicine that helps prevent harmful blood clots by causing blood to clot more slowly. It does this by decreasing the activity of vitamin K, which promotes normal blood clotting. For the dose of warfarin you have been prescribed to work well, you need to get about the same amount of vitamin K from your food from day to day. Suddenly getting a lot more vitamin K could cause your blood to clot too quickly. A  sudden decrease in vitamin K intake could cause your blood to clot too slowly. These changes in vitamin K intake could lead to dangerous blood clotsor to bleeding. WHAT GENERAL GUIDELINES DO I NEED TO FOLLOW? Keep your intake of vitamin K consistent from day to day. To do this, you must be aware of which foods contain moderate or high amounts of vitamin K. Listed below are some foods that are very high, high, or moderately high in vitamin K. If you eat these foods, make sure you eat a consistent amount of them from day to day. Avoid major changes in your diet, or tell your health care provider before changing your diet. If you take a multivitamin that contains vitamin K, be sure to take it every day. If you drink green tea, drink the same amount each day. WHAT FOODS ARE VERY HIGH IN VITAMIN K?  Greens, such as Swiss chard and beet, collard, mustard, or turnip greens (fresh or frozen, cooked). Kale (fresh or frozen, cooked).  Parsley (raw). Spinach (cooked).  WHAT FOODS ARE HIGH IN VITAMIN K? Asparagus (frozen, cooked). Beans, green (frozen, cooked). Broccoli.  Bok choy (cooked).  Brussels sprouts (fresh or frozen, cooked). Cabbage (cooked). Coleslaw. WHAT FOODS ARE MODERATELY HIGH IN VITAMIN K? Blueberries. Black-eyed peas. Endive (raw).  Green leaf lettuce (raw).  Green scallions (raw). Kale (raw). Okra (frozen, cooked). Plantains (fried). Romaine lettuce (raw).  Sauerkraut (canned).  Spinach (raw). Document Released: 05/21/2009 Document Revised: 07/29/2013 Document Reviewed: 05/28/2013 [facility] Patient Information 2015 [place], [place]. This information is not intended to replace advice given to you by your health care provider. Make sure you discuss any questions you have with your health care provider.

## 2015-05-09 NOTE — Progress Notes (Addendum)
      [address]       [place] [address]             [phone]      6 Days Post-Op Procedure(s) (LRB): CORONARY ARTERY BYPASS GRAFTING (CABG) times four using left internal mammary artery and right saphenous vein. (N/A) TRANSESOPHAGEAL ECHOCARDIOGRAM (TEE) (N/A)   Subjective:  [name] has no complaints.  He really wants to go home today, states he can't stay here another night.  + ambulation + BM  Objective: Vital signs in last 24 hours: Temp:  [97.8 F (36.6 C)-98.5 F (36.9 C)] 98.5 F (36.9 C) (10/02 0542) Pulse Rate:  [80-96] 80 (10/02 0542) Cardiac Rhythm:  [-] Heart block (10/01 2000) Resp:  [18] 18 (10/02 0542) BP: (125-141)/(65-77) 141/77 mmHg (10/02 0542) SpO2:  [93 %-98 %] 94 % (10/02 0542) Weight:  [200 lb (90.719 kg)] 200 lb (90.719 kg) (10/02 0542)  Intake/Output from previous day: 10/01 0701 - 10/02 0700 In: 360 [P.O.:360] Out: -   General appearance: alert, cooperative and no distress Heart: regular rate and rhythm Lungs: clear to auscultation bilaterally Abdomen: soft, non-tender; bowel sounds normal; no masses,  no organomegaly Extremities: edema trace Wound: clean and dry  Lab Results:  Recent Labs  05/08/15 0515 05/09/15 0400  WBC 10.0 9.6  HGB 8.8* 9.7*  HCT 26.9* 30.0*  PLT 308 364   BMET:  Recent Labs  05/07/15 0555  NA 136  K 4.0  CL 97*  CO2 28  GLUCOSE 132*  BUN 15  CREATININE 0.93  CALCIUM 8.4*    PT/INR:  Recent Labs  05/09/15 0400  LABPROT 16.5*  INR 1.32   ABG    Component Value Date/Time   PHART 7.429 05/04/2015 0252   HCO3 27.9* 05/04/2015 0252   TCO2 24 05/04/2015 1642   ACIDBASEDEF 4.0* 05/03/2015 2033   O2SAT 96.0 05/04/2015 0252   CBG (last 3)   Recent Labs  05/07/15 1627 05/07/15 2107 05/08/15 0613  GLUCAP 119* 141* 126*    Assessment/Plan: S/P Procedure(s) (LRB): CORONARY ARTERY BYPASS GRAFTING (CABG) times four using left internal mammary artery and right  saphenous vein. (N/A) TRANSESOPHAGEAL ECHOCARDIOGRAM (TEE) (N/A)  1. CV- Previous A. Flutter, converted to NSR yesterday, mild Hypertension- continue Amiodarone, increase Lopressor to 5 mg daily...hold Beta Blocker for now.... Appreciate EP recs 2. Pulm- no acute issues, off oxygen 3. INR 1.32, continue 5 mg daily 4. Renal- creatinine has been stable, weight is below baseline, some LE edema- will taper Lasix 5. Dispo- patient stable, now in NSR will d/c EPW.... Patient would like to be discharged today, will discuss with Dr. Roxy Manns   LOS: 6 days    [name], [name] 05/09/2015  I have seen and examined the patient and agree with the assessment and plan as outlined.  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  Increase lisinopril.  Plan d/c home tomorrow if rhythm stable.  Rexene Alberts, MD 05/09/2015 12:01 PM

## 2015-05-10 ENCOUNTER — Telehealth: Payer: Self-pay | Admitting: Thoracic Surgery (Cardiothoracic Vascular Surgery)

## 2015-05-10 DIAGNOSIS — Z951 Presence of aortocoronary bypass graft: Secondary | ICD-10-CM

## 2015-05-10 LAB — CBC
HCT: 32.5 % — ABNORMAL LOW (ref 39.0–52.0)
Hemoglobin: 10.1 g/dL — ABNORMAL LOW (ref 13.0–17.0)
MCH: 25.6 pg — AB (ref 26.0–34.0)
MCHC: 31.1 g/dL (ref 30.0–36.0)
MCV: 82.3 fL (ref 78.0–100.0)
PLATELETS: 443 10*3/uL — AB (ref 150–400)
RBC: 3.95 MIL/uL — ABNORMAL LOW (ref 4.22–5.81)
RDW: 16.2 % — AB (ref 11.5–15.5)
WBC: 11.4 10*3/uL — AB (ref 4.0–10.5)

## 2015-05-10 LAB — PROTIME-INR
INR: 1.75 — ABNORMAL HIGH (ref 0.00–1.49)
Prothrombin Time: 20.4 seconds — ABNORMAL HIGH (ref 11.6–15.2)

## 2015-05-10 MED ORDER — LISINOPRIL 10 MG PO TABS: 10.0000 mg | ORAL_TABLET | Freq: Every day | ORAL | Status: DC

## 2015-05-10 MED ORDER — AMIODARONE HCL 200 MG PO TABS
200.0000 mg | ORAL_TABLET | Freq: Two times a day (BID) | ORAL | Status: DC
  Administered 2015-05-10: 200 mg via ORAL
  Filled 2015-05-10: qty 1

## 2015-05-10 MED ORDER — AMIODARONE HCL 200 MG PO TABS: 200.0000 mg | ORAL_TABLET | Freq: Two times a day (BID) | ORAL | Status: DC

## 2015-05-10 NOTE — Telephone Encounter (Signed)
Patient wife wants to schedule coumadin check in [place] office.    Please obtain an order and let me know if its ok to schedule here.  Thank you !

## 2015-05-10 NOTE — Discharge Summary (Signed)
This is a discharge summary from the patient's stay at [facility].  This is a 64 year old male who presented initially to [facility] with chest pain was found to have inferior ST elevation on his EKG. Emergent cardiac catheterization was performed which showed: Severe calcified three-vessel coronary artery disease. The culprit for ST elevation myocardial infarction was an occluded midright coronary artery . He underwent successful balloon angioplasty and aspiration thrombectomy of the right coronary artery. This did not achieve optimal results due to the diffuse nature of disease in the right coronary artery, large thrombus and possible dissection at the lesion site. Given the presence of three-vessel coronary artery disease that also requires revascularization and suboptimal PCI results, the patient was transferred to [facility] for urgent CABG. I discussed the case with Dr. Roxan Hockey.

## 2015-05-10 NOTE — Progress Notes (Signed)
      [address]       [place] [address]             [phone]      7 Days Post-Op Procedure(s) (LRB): CORONARY ARTERY BYPASS GRAFTING (CABG) times four using left internal mammary artery and right saphenous vein. (N/A) TRANSESOPHAGEAL ECHOCARDIOGRAM (TEE) (N/A) Subjective: Feels well  Objective: Vital signs in last 24 hours: Temp:  [97.8 F (36.6 C)-98.2 F (36.8 C)] 97.9 F (36.6 C) (10/03 0607) Pulse Rate:  [78-84] 78 (10/03 0607) Cardiac Rhythm:  [-] Normal sinus rhythm (10/03 0700) Resp:  [18] 18 (10/03 0607) BP: (121-163)/(66-85) 121/74 mmHg (10/03 0607) SpO2:  [94 %-97 %] 94 % (10/03 0607) Weight:  [201 lb 1 oz (91.2 kg)] 201 lb 1 oz (91.2 kg) (10/03 0607)  Hemodynamic parameters for last 24 hours:    Intake/Output from previous day: 10/02 0701 - 10/03 0700 In: 240 [P.O.:240] Out: -  Intake/Output this shift:    General appearance: alert, cooperative and no distress Heart: regular rate and rhythm Lungs: clear to auscultation bilaterally Abdomen: benign Extremities: +LE edema Wound: incis heakling well  Lab Results:  Recent Labs  05/09/15 0400 05/10/15 0525  WBC 9.6 11.4*  HGB 9.7* 10.1*  HCT 30.0* 32.5*  PLT 364 443*   BMET: No results for input(s): NA, K, CL, CO2, GLUCOSE, BUN, CREATININE, CALCIUM in the last 72 hours.  PT/INR:  Recent Labs  05/10/15 0525  LABPROT 20.4*  INR 1.75*   ABG    Component Value Date/Time   PHART 7.429 05/04/2015 0252   HCO3 27.9* 05/04/2015 0252   TCO2 24 05/04/2015 1642   ACIDBASEDEF 4.0* 05/03/2015 2033   O2SAT 96.0 05/04/2015 0252   CBG (last 3)   Recent Labs  05/07/15 1627 05/07/15 2107 05/08/15 0613  GLUCAP 119* 141* 126*    Meds Scheduled Meds: . amiodarone  200 mg Oral BID  . aspirin EC  325 mg Oral Daily  . atorvastatin  20 mg Oral q1800  . bisacodyl  10 mg Oral Daily   Or  . bisacodyl  10 mg Rectal Daily  . docusate sodium  200 mg Oral Daily  . enoxaparin  (LOVENOX) injection  40 mg Subcutaneous QHS  . furosemide  40 mg Oral Daily  . lisinopril  10 mg Oral Daily  . pantoprazole  40 mg Oral Daily  . potassium chloride  20 mEq Oral BID  . sodium chloride  3 mL Intravenous Q12H  . tamsulosin  0.4 mg Oral QPC supper  . warfarin  5 mg Oral q1800  . Warfarin - Physician Dosing Inpatient   Does not apply q1800   Continuous Infusions:  PRN Meds:.sodium chloride, ALPRAZolam, alum & mag hydroxide-simeth, guaiFENesin-dextromethorphan, HYDROmorphone, Influenza vac split quadrivalent PF, magnesium hydroxide, ondansetron (ZOFRAN) IV, pneumococcal 23 valent vaccine, sodium chloride, traMADol, zolpidem  Xrays No results found.  Assessment/Plan: S/P Procedure(s) (LRB): CORONARY ARTERY BYPASS GRAFTING (CABG) times four using left internal mammary artery and right saphenous vein. (N/A) TRANSESOPHAGEAL ECHOCARDIOGRAM (TEE) (N/A)  1 conts with steady progress, stable for d/c to home   LOS: 7 days    Homer Pfeifer E 05/10/2015

## 2015-05-10 NOTE — Care Management Note (Signed)
Case Management Note  Patient Details  Name: [name] MRN: [id number] Date of Birth: June 06, 1951  Subjective/Objective:   Pt lives with spouse who will be available for 24/7 assistance when he is medically stable for discharge.  Adult son and dtr will also provide assistance.  Assisted spouse with documentation needed for reimbursement from [facility] insurance policy per request.                             Expected Discharge Plan:  [place]  Discharge planning Services  CM Consult  Status of Service:  Complete, will sign off  05/10/2015  CM assessed pt prior do discharge, pts wife will provide supervision for pt post discharge.  No CM needs  05/07/15  Pt transferred from ICU to [place] 05/06/15.  CM will continue to monitor for disposition needs  Maryclare Labrador, RN 05/10/2015, 9:08 AM

## 2015-05-11 NOTE — Telephone Encounter (Signed)
Already arranged per your office.

## 2015-05-12 ENCOUNTER — Ambulatory Visit (INDEPENDENT_AMBULATORY_CARE_PROVIDER_SITE_OTHER): Payer: 59

## 2015-05-12 DIAGNOSIS — Z5181 Encounter for therapeutic drug level monitoring: Secondary | ICD-10-CM | POA: Diagnosis not present

## 2015-05-12 DIAGNOSIS — I4891 Unspecified atrial fibrillation: Secondary | ICD-10-CM

## 2015-05-12 LAB — POCT INR: INR: 4.4

## 2015-05-12 NOTE — Patient Instructions (Signed)

## 2015-05-17 ENCOUNTER — Telehealth: Payer: Self-pay | Admitting: *Deleted

## 2015-05-17 ENCOUNTER — Encounter: Payer: Self-pay | Admitting: Nurse Practitioner

## 2015-05-17 ENCOUNTER — Ambulatory Visit (INDEPENDENT_AMBULATORY_CARE_PROVIDER_SITE_OTHER): Payer: 59 | Admitting: Nurse Practitioner

## 2015-05-17 ENCOUNTER — Other Ambulatory Visit: Payer: Self-pay | Admitting: *Deleted

## 2015-05-17 VITALS — BP 90/60 | HR 91 | Ht 66.0 in | Wt 194.2 lb

## 2015-05-17 DIAGNOSIS — I9789 Other postprocedural complications and disorders of the circulatory system, not elsewhere classified: Secondary | ICD-10-CM

## 2015-05-17 DIAGNOSIS — I10 Essential (primary) hypertension: Secondary | ICD-10-CM

## 2015-05-17 DIAGNOSIS — Z8679 Personal history of other diseases of the circulatory system: Secondary | ICD-10-CM | POA: Insufficient documentation

## 2015-05-17 DIAGNOSIS — I2510 Atherosclerotic heart disease of native coronary artery without angina pectoris: Secondary | ICD-10-CM

## 2015-05-17 DIAGNOSIS — I2119 ST elevation (STEMI) myocardial infarction involving other coronary artery of inferior wall: Secondary | ICD-10-CM | POA: Diagnosis not present

## 2015-05-17 DIAGNOSIS — I4891 Unspecified atrial fibrillation: Secondary | ICD-10-CM

## 2015-05-17 DIAGNOSIS — E785 Hyperlipidemia, unspecified: Secondary | ICD-10-CM | POA: Insufficient documentation

## 2015-05-17 DIAGNOSIS — I255 Ischemic cardiomyopathy: Secondary | ICD-10-CM

## 2015-05-17 MED ORDER — LISINOPRIL 5 MG PO TABS: 5.0000 mg | ORAL_TABLET | Freq: Every day | ORAL | Status: DC

## 2015-05-17 MED ORDER — ATORVASTATIN CALCIUM 80 MG PO TABS: 80.0000 mg | ORAL_TABLET | Freq: Every day | ORAL | Status: DC

## 2015-05-17 MED ORDER — ASPIRIN 81 MG PO TBEC: 81.0000 mg | DELAYED_RELEASE_TABLET | Freq: Every day | ORAL | Status: AC

## 2015-05-17 NOTE — Patient Instructions (Signed)
Medication Instructions:  Please decrease aspirin to 81 mg once daily Please increase Lipitor to 80 mg once daily  Please decrease Lisinopril to 5 mg once daily  Labwork: We will check your liver and cholesterol at your next visit (come in fasting)  Testing/Procedures: None  Follow-Up: 6 weeks with Dr. Fletcher Anon

## 2015-05-17 NOTE — Telephone Encounter (Signed)
Patient called and the new Rx for Lipitor and Lisinopril  Hasn't been sent in to [facility]. Please call patient.

## 2015-05-17 NOTE — Progress Notes (Signed)
Patient Name: [name] Date of Encounter: 05/17/2015  Primary Care Provider:  [name], MD Primary Cardiologist:  Jerilynn Mages. Fletcher Anon, MD   Chief Complaint  64 y/o male who is s/p recent inferior STEMI and CABG x 4, who presents for f/u.  Past Medical History   Past Medical History  Diagnosis Date  . Kidney stones     a. 02/2015.  Marland Kitchen Coronary atherosclerosis     a. CT scan 01/02/2015: extensive coronary atherosclerosis;  b. 04/2015 Inf STEMI/Cath/CABG x 4: LIMA->LAD, VG->Diag, VG->OM2, VG->RPL.  Marland Kitchen HTN (hypertension)   . HLD (hyperlipidemia)   . Postoperative atrial fibrillation ([place])     a. 04/2015 Post-op CABG AF/AFlutter--> converted with rapid atrial pacing and amio; b. CHA2DS2VASc = 3->Coumadin.  . Complete heart block ([place])     a. 04/2015 in setting of inferior STEMI-->resolved.   Past Surgical History  Procedure Laterality Date  . Neck surgery    . Hernia repair    . Right hip replacement    . Joint replacement    . Cardiac catheterization N/A 05/03/2015    Procedure: Left Heart Cath and Coronary Angiography;  Surgeon: Wellington Hampshire, MD;  Location: [facility];  Service: Cardiovascular;  Laterality: N/A;  . Coronary artery bypass graft N/A 05/03/2015    Procedure: CORONARY ARTERY BYPASS GRAFTING (CABG) times four using left internal mammary artery and right saphenous vein.;  Surgeon: [name], MD;  Location: [place];  Service: Open Heart Surgery;  Laterality: N/A;  . Tee without cardioversion N/A 05/03/2015    Procedure: TRANSESOPHAGEAL ECHOCARDIOGRAM (TEE);  Surgeon: [name], MD;  Location: [place];  Service: Open Heart Surgery;  Laterality: N/A;    Allergies  No Known Allergies  HPI  64 y/o male with the above past medical history.  In May 2016, he developed pain related to kidney stones and was evaluated in the [facility] ED.  Besides showing a 3 mm left ureteral stone, extensive coronary atherosclerosis was noted.  He was asymptomatic at  the time.  He was referred for cardiology evaluation and underwent stress testing, which did not show ischemia.  In September, he began to note intermittent chest discomfort and flu-like symptoms/malaise.  He saw his PCP and c/o c/p on 9/26 and was referred to the [facility] ED.  There, he was found to have inferior ST elevation.  He underwent emergent cath revealing severe multivessel CAD.  He had CHB req a temp wire. He was transferred to [facility] for emergent CABG x4, which was successful.  Post-op course was complicated by ongoing CHB and then post-op AF/Flutter req rapid atrial pacing and amiodarone and coumadin initiation (he was d/c'd in sinus rhythm).  He also suffered a small R PTX, which did not require intervention and an ileus.    He was d/c'd on 10/3 and reports doing well since.  He has intermittent chest wall pain r/t his surgical incision but denies angina or dyspnea.  He doesn't feel that his strength is quite 100% yet and is eager to start feeling better and getting into cardiac rehab @ [facility].  He has had intermittent lightheadedness while walking.  He denies palpitations, pnd, orthopnea, n, v, dizziness, syncope, edema, weight gain, or early satiety.   Home Medications  Prior to Admission medications   Medication Sig Start Date End Date Taking? Authorizing Provider  amiodarone (PACERONE) 200 MG tablet Take 1 tablet (200 mg total) by mouth 2 (two) times daily. 05/10/15  Yes  Wayne E Gold, PA-C  aspirin 81 MG EC tablet Take 1 tablet (81 mg total) by mouth daily. 05/17/15  Yes Rogelia Mire, NP  atorvastatin (LIPITOR) 80 MG tablet Take 1 tablet (80 mg total) by mouth daily at 6 PM. 05/17/15  Yes Rogelia Mire, NP  ENSURE PLUS (ENSURE PLUS) LIQD Take 237 mLs by mouth daily.   Yes Historical Provider, MD  furosemide (LASIX) 40 MG tablet Take 1 tablet (40 mg total) by mouth daily. For 7 Days 05/09/15  Yes Erin R [name], PA-C  guaiFENesin-dextromethorphan (ROBITUSSIN DM) 100-10 MG/5ML syrup  Take 15 mLs by mouth every 4 (four) hours as needed for cough. 05/09/15  Yes Erin R [name], PA-C  lisinopril (PRINIVIL,ZESTRIL) 5 MG tablet Take 1 tablet (5 mg total) by mouth daily. 05/17/15  Yes Rogelia Mire, NP  ondansetron (ZOFRAN ODT) 4 MG disintegrating tablet Take 1 tablet (4 mg total) by mouth every 8 (eight) hours as needed for nausea or vomiting. 01/03/15  Yes Joanne Gavel, MD  potassium chloride SA (K-DUR,KLOR-CON) 20 MEQ tablet Take 1 tablet (20 mEq total) by mouth daily. For 7 days 05/09/15  Yes Erin R [name], PA-C  tamsulosin (FLOMAX) 0.4 MG CAPS capsule Take 1 capsule (0.4 mg total) by mouth daily. 01/02/15  Yes Orbie Pyo, MD  warfarin (COUMADIN) 5 MG tablet Take 1 tablet (5 mg total) by mouth daily at 6 PM. Patient taking differently: Take 2.5 mg by mouth daily at 6 PM.  05/09/15  Yes Erin R [name], PA-C    Review of Systems  Chest wall pain and easy fatigability as outlined above.  He has had intermittent lightheadedness.  He denies chest pain, palpitations, dyspnea, pnd, orthopnea, n, v, dizziness, syncope, edema, weight gain, or early satiety.  All other systems reviewed and are otherwise negative except as noted above.  Physical Exam  VS:  BP 90/60 mmHg  Pulse 91  Ht 5\' 6"  (1.676 m)  Wt 194 lb 4 oz (88.111 kg)  BMI 31.37 kg/m2 , BMI Body mass index is 31.37 kg/(m^2). GEN: Well nourished, well developed, in no acute distress. HEENT: normal. Neck: Supple, no JVD, carotid bruits, or masses. Cardiac: RRR, no murmurs, rubs, or gallops. No clubbing, cyanosis, edema.  Radials/DP/PT 2+ and equal bilaterally. Sternal wound healing well w/o erythema or drainage. Respiratory:  Respirations regular and unlabored, clear to auscultation bilaterally. GI: Soft, nontender, nondistended, BS + x 4.  Abdominal wounds wound healing well w/o erythema or drainage. MS: no deformity or atrophy. Skin: warm and dry, no rash. Neuro:  Strength and sensation are intact. Psych:  Normal affect.  Accessory Clinical Findings  ECG - RSR, 91, 1st deg AVB, inf q's with ongoing, mild inf ST elevation (unchanged) and poor R progression.  TWI I/aVL.  No acute st/t changes.  Lab Results  Component Value Date   CHOL 230* 05/03/2015   HDL 30* 05/03/2015   LDLCALC 168* 05/03/2015   TRIG 162* 05/03/2015   CHOLHDL 7.7 05/03/2015    Assessment & Plan  1.  Inferior STEMI, subsequent episode of care/CAD:  S/P CABG x 4 on 9/26.  He was d/c'd 10/3 and has done reasonably well w/o c/p or dyspnea since.  He does have some chest wall pain but is not currently requiring pain medication.  He remains on ASA, statin, and ACEI therapy.  He is not on a BB secondary to CHB at the time of his event and following surgery.  His HR is  better today (91), however his BP is low @ 90/60, thus there is no room to add BB today.  In fact, as he has been having intermittent lightheadedness, I am reducing his lisinopril to 5 mg daily.  He is on only low to moderate dose statin despite recent STEMI with an LDL of 168.  We discussed the guidelines and relevant trial data related to statin usage and dosing following ACS.  I will increase his lipitor to 80 mg daily today.  Finally, as he is on ASA and coumadin, despite recent ACS, I will not be adding plavix/brilinta at this time.  He is looking forward to participating in cardiac [facility] @ [facility].  He has f/u with Dr. Roxan Hockey on 11/1.  2.  Post-operative Atrial Fibrillation and Flutter:  Maintaining sinus rhythm on amio.  He is anticoagulated with coumadin and currently being followed in our coumadin clinic.  3.  Essential HTN:  BP low.  Reducing lisinopril to 5 mg daily as above.  4.  HL:  LDL 168.  As above, given recent ACS, I will increase lipitor to 80.  F/u Lipids/lft's in 6 wks.  5.  Ischemic Cardiomyopathy:  EF 35-40% by LV gram @ time of cath.  No echo during hospitalization.  Euvolemic on exam.  Cont ACEI @ lower dose in setting of relative  hypotension. No bb initially 2/2 CHB during hospitalization.  Relative hypotension prevents adding now.  No spiro 2/2 relative hypotension.  F/U echo 3 mos post-revascularization to reassess EF.  6.  Dispo:  F/U with Dr. Fletcher Anon in 6 wks @ which time we can repeal lipids/lft's.   Murray Hodgkins, NP 05/17/2015, 11:59 AM

## 2015-05-17 NOTE — Telephone Encounter (Signed)
Rx sent to local pharmacy for Lipitor and Lisinopril.

## 2015-05-19 ENCOUNTER — Ambulatory Visit (INDEPENDENT_AMBULATORY_CARE_PROVIDER_SITE_OTHER): Payer: 59

## 2015-05-19 DIAGNOSIS — Z5181 Encounter for therapeutic drug level monitoring: Secondary | ICD-10-CM

## 2015-05-19 DIAGNOSIS — I4891 Unspecified atrial fibrillation: Secondary | ICD-10-CM | POA: Diagnosis not present

## 2015-05-19 LAB — POCT INR: INR: 2.4

## 2015-05-26 ENCOUNTER — Ambulatory Visit (INDEPENDENT_AMBULATORY_CARE_PROVIDER_SITE_OTHER): Payer: 59

## 2015-05-26 DIAGNOSIS — Z5181 Encounter for therapeutic drug level monitoring: Secondary | ICD-10-CM | POA: Diagnosis not present

## 2015-05-26 DIAGNOSIS — I4891 Unspecified atrial fibrillation: Secondary | ICD-10-CM | POA: Diagnosis not present

## 2015-05-26 LAB — POCT INR: INR: 3

## 2015-06-01 ENCOUNTER — Encounter: Payer: 59 | Attending: Thoracic Surgery (Cardiothoracic Vascular Surgery) | Admitting: *Deleted

## 2015-06-01 DIAGNOSIS — Z951 Presence of aortocoronary bypass graft: Secondary | ICD-10-CM | POA: Diagnosis not present

## 2015-06-01 DIAGNOSIS — I213 ST elevation (STEMI) myocardial infarction of unspecified site: Secondary | ICD-10-CM | POA: Insufficient documentation

## 2015-06-01 NOTE — Patient Instructions (Signed)
Patient Instructions  Patient Details  Name: [name] MRN: [id number] Date of Birth: [birth year] Referring Provider:  [name], *  Below are the personal goals you chose as well as exercise and nutrition goals. Our goal is to help you keep on track towards obtaining and maintaining your goals. We will be discussing your progress on these goals with you throughout the program.  Initial Exercise Prescription:     Initial Exercise Prescription - 06/01/15 1100    Date of Initial Exercise Prescription   Date 06/01/15   Treadmill   MPH 2   Grade 0   Minutes 10   Bike   Level 0.2   Minutes 15   Recumbant Bike   Level 3   RPM 40   Watts 25   Minutes 15   NuStep   Level 3   Watts 30   Minutes 15   Arm Ergometer   Level 1   Watts 10   Minutes 10   Arm/Foot Ergometer   Level 4   Watts 15   Minutes 10   Cybex   Level 3   RPM 50   Minutes 10   Recumbant Elliptical   Level 1   RPM 40   Watts 10   Minutes 15   Elliptical   Level 1   Speed 3   Minutes 1   REL-XR   Level 3   Watts 40   Minutes 15   Prescription Details   Frequency (times per week) 3   Duration Progress to 30 minutes of continuous aerobic without signs/symptoms of physical distress   Intensity   THRR REST +  30   Ratings of Perceived Exertion 11-15   Progression Continue progressive overload as per policy without signs/symptoms or physical distress.   Resistance Training   Training Prescription Yes   Weight 2   Reps 10-15      Exercise Goals: Frequency: Be able to perform aerobic exercise three times per week working toward 3-5 days per week.  Intensity: Work with a perceived exertion of 11 (fairly light) - 15 (hard) as tolerated. Follow your new exercise prescription and watch for changes in prescription as you progress with the program. Changes will be reviewed with you when they are made.  Duration: You should be able to do 30 minutes of continuous aerobic exercise in  addition to a 5 minute warm-up and a 5 minute cool-down routine.  Nutrition Goals: Your personal nutrition goals will be established when you do your nutrition analysis with the dietician.  The following are nutrition guidelines to follow: Cholesterol < 200mg /day Sodium < 1500mg /day Fiber: Men over 50 yrs - 30 grams per day  Personal Goals:     Personal Goals and Risk Factors at Admission - 06/01/15 1008    Personal Goals and Risk Factors on Admission    Weight Management Yes   Intervention Learn and follow the exercise and diet guidelines while in the program. Utilize the nutrition and education classes to help gain knowledge of the diet and exercise expectations in the program   Admit Weight 192 lb (87.091 kg)   Goal Weight 185 lb (83.915 kg)   Increase Aerobic Exercise and Physical Activity Yes;Sedentary  HAd physically active job, no routine exercise program.   Take Less Medication Yes   Intervention Learn your risk factors and begin the lifestyle modifications for risk factor control during your time in the program.   Understand more about Heart/Pulmonary Disease. Yes  Intervention While in program utilize professionals for any questions, and attend the education sessions. Great websites to use are www.americanheart.org or www.lung.org for reliable information.   Diabetes No   Hypertension Yes   Goal Participant will see blood pressure controlled within the values of 140/40mm/Hg or within value directed by their physician.   Intervention Provide nutrition & aerobic exercise along with prescribed medications to achieve BP 140/90 or less.   Lipids Yes   Goal Cholesterol controlled with medications as prescribed, with individualized exercise RX and with personalized nutrition plan. Value goals: LDL < 70mg , HDL > 40mg . Participant states understanding of desired cholesterol values and following prescriptions.   Intervention Provide nutrition & aerobic exercise along with prescribed  medications to achieve LDL 70mg , HDL >40mg .   Stress No      Tobacco Use Initial Evaluation: History  Smoking status  . Never Smoker   Smokeless tobacco  . Not on file    Copy of goals given to participant.

## 2015-06-01 NOTE — Progress Notes (Signed)
Cardiac Individual Treatment Plan  Patient Details  Name: [name] MRN: [id number] Date of Birth: 13-Oct-1950 Referring Provider:  [name], *  Initial Encounter Date: Date: 06/01/15  Visit Diagnosis: S/P CABG x 4  ST elevation myocardial infarction (STEMI), unspecified artery ([place])  Patient's Home Medications on Admission:  Current outpatient prescriptions:  .  amiodarone (PACERONE) 200 MG tablet, Take 1 tablet (200 mg total) by mouth 2 (two) times daily., Disp: 60 tablet, Rfl: 1 .  aspirin 81 MG EC tablet, Take 1 tablet (81 mg total) by mouth daily., Disp: , Rfl:  .  atorvastatin (LIPITOR) 80 MG tablet, Take 1 tablet (80 mg total) by mouth daily at 6 PM., Disp: 90 tablet, Rfl: 3 .  ENSURE PLUS (ENSURE PLUS) LIQD, Take 237 mLs by mouth daily., Disp: , Rfl:  .  furosemide (LASIX) 40 MG tablet, Take 1 tablet (40 mg total) by mouth daily. For 7 Days, Disp: 7 tablet, Rfl: 0 .  lisinopril (PRINIVIL,ZESTRIL) 5 MG tablet, Take 1 tablet (5 mg total) by mouth daily., Disp: 90 tablet, Rfl: 3 .  potassium chloride SA (K-DUR,KLOR-CON) 20 MEQ tablet, Take 1 tablet (20 mEq total) by mouth daily. For 7 days, Disp: 7 tablet, Rfl: 0 .  tamsulosin (FLOMAX) 0.4 MG CAPS capsule, Take 1 capsule (0.4 mg total) by mouth daily., Disp: 30 capsule, Rfl: 0 .  warfarin (COUMADIN) 5 MG tablet, Take 1 tablet (5 mg total) by mouth daily at 6 PM. (Patient taking differently: Take 2.5 mg by mouth daily at 6 PM. ), Disp: 30 tablet, Rfl: 3 .  guaiFENesin-dextromethorphan (ROBITUSSIN DM) 100-10 MG/5ML syrup, Take 15 mLs by mouth every 4 (four) hours as needed for cough. (Patient not taking: Reported on 06/01/2015), Disp: 118 mL, Rfl: 0 .  ondansetron (ZOFRAN ODT) 4 MG disintegrating tablet, Take 1 tablet (4 mg total) by mouth every 8 (eight) hours as needed for nausea or vomiting. (Patient not taking: Reported on 06/01/2015), Disp: 15 tablet, Rfl: 0  Past Medical History: Past Medical History   Diagnosis Date  . Kidney stones     a. May 2016.  Marland Kitchen Coronary atherosclerosis     a. CT scan 01/02/2015: extensive coronary atherosclerosis;  b. 04/2015 Inf STEMI/Cath/CABG x 4: LIMA->LAD, VG->Diag, VG->OM2, VG->RPL.  Marland Kitchen HTN (hypertension)   . HLD (hyperlipidemia)   . Postoperative atrial fibrillation ([place])     a. 04/2015 Post-op CABG AF/AFlutter--> converted with rapid atrial pacing and amio; b. CHA2DS2VASc = 3->Coumadin.  . Complete heart block ([place])     a. 04/2015 in setting of inferior STEMI-->resolved.  . Ischemic cardiomyopathy     a. 04/2015 LV gram: EF 35-40%.    Tobacco Use: History  Smoking status  . Never Smoker   Smokeless tobacco  . Not on file    Labs: Recent Review Flowsheet Data    Labs for ITP Cardiac and Pulmonary Rehab Latest Ref Rng 05/03/2015 05/03/2015 05/04/2015 05/04/2015 05/04/2015   PHART 7.350 - 7.450 - 7.309(L) 7.404 7.429 -   PCO2ART 35.0 - 45.0 mmHg - 44.0 39.7 42.0 -   HCO3 20.0 - 24.0 mEq/L - 22.3 24.9(H) 27.9(H) -   TCO2 0 - 100 mmol/L 21 24 26 29 24    ACIDBASEDEF 0.0 - 2.0 mmol/L - 4.0(H) - - -   O2SAT - - 89.0 97.0 96.0 -       Exercise Target Goals: Date: 06/01/15  Exercise Program Goal: Individual exercise prescription set with THRR, safety & activity barriers. Participant  demonstrates ability to understand and report RPE using BORG scale, to self-measure pulse accurately, and to acknowledge the importance of the exercise prescription.  Exercise Prescription Goal: Starting with aerobic activity 30 plus minutes a day, 3 days per week for initial exercise prescription. Provide home exercise prescription and guidelines that participant acknowledges understanding prior to discharge.  Activity Barriers & Risk Stratification:     Activity Barriers & Risk Stratification - 06/01/15 1004    Activity Barriers & Risk Stratification   Activity Barriers Right Hip Replacement      6 Minute Walk:     6 Minute Walk      06/01/15 1053       6  Minute Walk   Phase Initial     Distance 1170 feet     Walk Time 6 minutes     Resting HR 65 bpm     Resting BP 112/62 mmHg     Max Ex. HR 101 bpm     Max Ex. BP 114/54 mmHg     RPE 12     Symptoms No        Initial Exercise Prescription:     Initial Exercise Prescription - 06/01/15 1100    Date of Initial Exercise Prescription   Date 06/01/15   Treadmill   MPH 2   Grade 0   Minutes 10   Bike   Level 0.2   Minutes 15   Recumbant Bike   Level 3   RPM 40   Watts 25   Minutes 15   NuStep   Level 3   Watts 30   Minutes 15   Arm Ergometer   Level 1   Watts 10   Minutes 10   Arm/Foot Ergometer   Level 4   Watts 15   Minutes 10   Cybex   Level 3   RPM 50   Minutes 10   Recumbant Elliptical   Level 1   RPM 40   Watts 10   Minutes 15   Elliptical   Level 1   Speed 3   Minutes 1   REL-XR   Level 3   Watts 40   Minutes 15   Prescription Details   Frequency (times per week) 3   Duration Progress to 30 minutes of continuous aerobic without signs/symptoms of physical distress   Intensity   THRR REST +  30   Ratings of Perceived Exertion 11-15   Progression Continue progressive overload as per policy without signs/symptoms or physical distress.   Resistance Training   Training Prescription Yes   Weight 2   Reps 10-15      Exercise Prescription Changes:   Discharge Exercise Prescription (Final Exercise Prescription Changes):   Nutrition:  Target Goals: Understanding of nutrition guidelines, daily intake of sodium 1500mg , cholesterol 200mg , calories 30% from fat and 7% or less from saturated fats, daily to have 5 or more servings of fruits and vegetables.  Biometrics:     Pre Biometrics - 06/01/15 1053    Pre Biometrics   Height 5' 7.25" (1.708 m)   Weight 200 lb 8 oz (90.946 kg)   Waist Circumference 42 inches   Hip Circumference 40.5 inches   Waist to Hip Ratio 1.04 %   BMI (Calculated) 31.2       Nutrition Therapy Plan and  Nutrition Goals:   Nutrition Discharge: Rate Your Plate Scores:   Nutrition Goals Re-Evaluation:   Psychosocial: Target Goals: Acknowledge presence or absence of  depression, maximize coping skills, provide positive support system. Participant is able to verbalize types and ability to use techniques and skills needed for reducing stress and depression.  Initial Review & Psychosocial Screening:     Initial Psych Review & Screening - 06/01/15 1007    Initial Review   Current issues with Current Sleep Concerns  Sleeping concerns when first discharged. Has improved since healing process  has made him able to sleep on his side.    Family Dynamics   Good Support System? --  Good support system at home. Wife,daughter and son.   Barriers   Psychosocial barriers to participate in program There are no identifiable barriers or psychosocial needs.;The patient should benefit from training in stress management and relaxation.   Screening Interventions   Interventions Encouraged to exercise;Program counselor consult      Quality of Life Scores:     Quality of Life - 06/01/15 1536    Quality of Life Scores   Health/Function Pre 23.83 %   Socioeconomic Pre 30 %   Psych/Spiritual Pre 30 %   Family Pre 30 %   GLOBAL Pre 27.36 %      PHQ-9:     Recent Review Flowsheet Data    Depression screen [facility] 2/9 06/01/2015   Decreased Interest 0   Down, Depressed, Hopeless 0   PHQ - 2 Score 0   Altered sleeping 3   Tired, decreased energy 1   Change in appetite 0   Feeling bad or failure about yourself  0   Trouble concentrating 0   Moving slowly or fidgety/restless 0   Suicidal thoughts 0   PHQ-9 Score 4   Difficult doing work/chores Not difficult at all      Psychosocial Evaluation and Intervention:   Psychosocial Re-Evaluation:   Vocational Rehabilitation: Provide vocational rehab assistance to qualifying candidates.   Vocational Rehab Evaluation & Intervention:      Vocational Rehab - 06/01/15 1004    Initial Vocational Rehab Evaluation & Intervention   Assessment shows need for Vocational Rehabilitation No      Education: Education Goals: Education classes will be provided on a weekly basis, covering required topics. Participant will state understanding/return demonstration of topics presented.  Learning Barriers/Preferences:     Learning Barriers/Preferences - 06/01/15 1004    Learning Barriers/Preferences   Learning Barriers None   Learning Preferences Group Instruction      Education Topics: General Nutrition Guidelines/Fats and Fiber: -Group instruction provided by verbal, written material, models and posters to present the general guidelines for heart healthy nutrition. Gives an explanation and review of dietary fats and fiber.   Controlling Sodium/Reading Food Labels: -Group verbal and written material supporting the discussion of sodium use in heart healthy nutrition. Review and explanation with models, verbal and written materials for utilization of the food label.   Exercise Physiology & Risk Factors: - Group verbal and written instruction with models to review the exercise physiology of the cardiovascular system and associated critical values. Details cardiovascular disease risk factors and the goals associated with each risk factor.   Aerobic Exercise & Resistance Training: - Gives group verbal and written discussion on the health impact of inactivity. On the components of aerobic and resistive training programs and the benefits of this training and how to safely progress through these programs.   Flexibility, Balance, General Exercise Guidelines: - Provides group verbal and written instruction on the benefits of flexibility and balance training programs. Provides general exercise guidelines with specific guidelines  to those with heart or lung disease. Demonstration and skill practice provided.   Stress Management: -  Provides group verbal and written instruction about the health risks of elevated stress, cause of high stress, and healthy ways to reduce stress.   Depression: - Provides group verbal and written instruction on the correlation between heart/lung disease and depressed mood, treatment options, and the stigmas associated with seeking treatment.   Anatomy & Physiology of the Heart: - Group verbal and written instruction and models provide basic cardiac anatomy and physiology, with the coronary electrical and arterial systems. Review of: AMI, Angina, Valve disease, Heart Failure, Cardiac Arrhythmia, Pacemakers, and the ICD.   Cardiac Procedures: - Group verbal and written instruction and models to describe the testing methods done to diagnose heart disease. Reviews the outcomes of the test results. Describes the treatment choices: Medical Management, Angioplasty, or Coronary Bypass Surgery.   Cardiac Medications: - Group verbal and written instruction to review commonly prescribed medications for heart disease. Reviews the medication, class of the drug, and side effects. Includes the steps to properly store meds and maintain the prescription regimen.   Go Sex-Intimacy & Heart Disease, Get SMART - Goal Setting: - Group verbal and written instruction through game format to discuss heart disease and the return to sexual intimacy. Provides group verbal and written material to discuss and apply goal setting through the application of the S.M.A.R.T. Method.   Other Matters of the Heart: - Provides group verbal, written materials and models to describe Heart Failure, Angina, Valve Disease, and Diabetes in the realm of heart disease. Includes description of the disease process and treatment options available to the cardiac patient.   Exercise & Equipment Safety: - Individual verbal instruction and demonstration of equipment use and safety with use of the equipment.          Cardiac Rehab from  06/01/2015 in [facility] Cardiac Rehab   Date  06/01/15   Educator  Sb   Instruction Review Code  2- meets goals/outcomes      Infection Prevention: - Provides verbal and written material to individual with discussion of infection control including proper hand washing and proper equipment cleaning during exercise session.      Cardiac Rehab from 06/01/2015 in [facility] Cardiac Rehab   Date  06/01/15   Educator  SB   Instruction Review Code  2- meets goals/outcomes      Falls Prevention: - Provides verbal and written material to individual with discussion of falls prevention and safety.      Cardiac Rehab from 06/01/2015 in [facility] Cardiac Rehab   Date  06/01/15   Educator  SB   Instruction Review Code  2- meets goals/outcomes      Diabetes: - Individual verbal and written instruction to review signs/symptoms of diabetes, desired ranges of glucose level fasting, after meals and with exercise. Advice that pre and post exercise glucose checks will be done for 3 sessions at entry of program.    Knowledge Questionnaire Score:     Knowledge Questionnaire Score - 06/01/15 1536    Knowledge Questionnaire Score   Pre Score 21/28      Personal Goals and Risk Factors at Admission:     Personal Goals and Risk Factors at Admission - 06/01/15 1008    Personal Goals and Risk Factors on Admission    Weight Management Yes   Intervention Learn and follow the exercise and diet guidelines while in the program. Utilize the nutrition and education classes to help  gain knowledge of the diet and exercise expectations in the program   Admit Weight 192 lb (87.091 kg)   Goal Weight 185 lb (83.915 kg)   Increase Aerobic Exercise and Physical Activity Yes;Sedentary  HAd physically active job, no routine exercise program.   Take Less Medication Yes   Intervention Learn your risk factors and begin the lifestyle modifications for risk factor control during your time in the program.   Understand more about  Heart/Pulmonary Disease. Yes   Intervention While in program utilize professionals for any questions, and attend the education sessions. Great websites to use are www.americanheart.org or www.lung.org for reliable information.   Diabetes No   Hypertension Yes   Goal Participant will see blood pressure controlled within the values of 140/5mm/Hg or within value directed by their physician.   Intervention Provide nutrition & aerobic exercise along with prescribed medications to achieve BP 140/90 or less.   Lipids Yes   Goal Cholesterol controlled with medications as prescribed, with individualized exercise RX and with personalized nutrition plan. Value goals: LDL < 70mg , HDL > 40mg . Participant states understanding of desired cholesterol values and following prescriptions.   Intervention Provide nutrition & aerobic exercise along with prescribed medications to achieve LDL 70mg , HDL >40mg .   Stress No      Personal Goals and Risk Factors Review:    Personal Goals Discharge (Final Personal Goals and Risk Factors Review):     Comments: Initial ITP orientation.

## 2015-06-02 ENCOUNTER — Ambulatory Visit (INDEPENDENT_AMBULATORY_CARE_PROVIDER_SITE_OTHER): Payer: 59

## 2015-06-02 DIAGNOSIS — I4891 Unspecified atrial fibrillation: Secondary | ICD-10-CM

## 2015-06-02 DIAGNOSIS — Z5181 Encounter for therapeutic drug level monitoring: Secondary | ICD-10-CM

## 2015-06-02 LAB — POCT INR: INR: 2.9

## 2015-06-07 ENCOUNTER — Other Ambulatory Visit: Payer: Self-pay | Admitting: Thoracic Surgery (Cardiothoracic Vascular Surgery)

## 2015-06-07 DIAGNOSIS — Z951 Presence of aortocoronary bypass graft: Secondary | ICD-10-CM

## 2015-06-08 ENCOUNTER — Ambulatory Visit (INDEPENDENT_AMBULATORY_CARE_PROVIDER_SITE_OTHER): Payer: Self-pay | Admitting: Thoracic Surgery (Cardiothoracic Vascular Surgery)

## 2015-06-08 ENCOUNTER — Encounter: Payer: Self-pay | Admitting: Thoracic Surgery (Cardiothoracic Vascular Surgery)

## 2015-06-08 ENCOUNTER — Encounter: Payer: Self-pay | Admitting: *Deleted

## 2015-06-08 ENCOUNTER — Ambulatory Visit
Admission: RE | Admit: 2015-06-08 | Discharge: 2015-06-08 | Disposition: A | Discharge status: Home / Self Care | Payer: 59 | Source: Ambulatory Visit | Attending: Thoracic Surgery (Cardiothoracic Vascular Surgery) | Admitting: Thoracic Surgery (Cardiothoracic Vascular Surgery)

## 2015-06-08 VITALS — BP 140/90 | HR 68 | Resp 20 | Ht 67.0 in | Wt 200.0 lb

## 2015-06-08 DIAGNOSIS — Z736 Limitation of activities due to disability: Secondary | ICD-10-CM

## 2015-06-08 DIAGNOSIS — Z951 Presence of aortocoronary bypass graft: Secondary | ICD-10-CM

## 2015-06-08 NOTE — Progress Notes (Signed)
[address]       [place] [address]             [phone]       HPI: [name] scheduled postoperative follow-up visit.  He is a 64 year old gentleman who presented to [facility] with an ST elevation MI. He had a temporizing PTCA and was transferred to [facility] directly to the operating room for emergency coronary bypass grafting 4 on 05/03/2015.  He went into complete heart block on induction. He did not have any more complete heart block postoperatively. He did have atrial fibrillation and flutter with a controlled rate. He was treated with amiodarone. He was not treated with a beta blocker due to his heart block. He was anticoagulated prior to discharge.  Since discharge he says he's been doing well. His appetite is improving. His exercise tolerance is improving. He is going to start cardiac rehabilitation on Friday. He is not having to take any narcotics. He doesn't have incisional pain but does complain of a little bit of tenderness when he touches his chest.  Past Medical History  Diagnosis Date  . Kidney stones     a. May 2016.  Marland Kitchen Coronary atherosclerosis     a. CT scan 01/02/2015: extensive coronary atherosclerosis;  b. 04/2015 Inf STEMI/Cath/CABG x 4: LIMA->LAD, VG->Diag, VG->OM2, VG->RPL.  Marland Kitchen HTN (hypertension)   . HLD (hyperlipidemia)   . Postoperative atrial fibrillation ([place])     a. 04/2015 Post-op CABG AF/AFlutter--> converted with rapid atrial pacing and amio; b. CHA2DS2VASc = 3->Coumadin.  . Complete heart block ([place])     a. 04/2015 in setting of inferior STEMI-->resolved.  . Ischemic cardiomyopathy     a. 04/2015 LV gram: EF 35-40%.      Current Outpatient Prescriptions  Medication Sig Dispense Refill  . amiodarone (PACERONE) 200 MG tablet Take 1 tablet (200 mg total) by mouth 2 (two) times daily. 60 tablet 1  . aspirin 81 MG EC tablet Take 1 tablet (81 mg total) by mouth daily.    Marland Kitchen atorvastatin (LIPITOR) 80 MG tablet  Take 1 tablet (80 mg total) by mouth daily at 6 PM. 90 tablet 3  . ENSURE PLUS (ENSURE PLUS) LIQD Take 237 mLs by mouth daily.    Marland Kitchen lisinopril (PRINIVIL,ZESTRIL) 5 MG tablet Take 1 tablet (5 mg total) by mouth daily. 90 tablet 3  . warfarin (COUMADIN) 5 MG tablet Take 1 tablet (5 mg total) by mouth daily at 6 PM. (Patient taking differently: Take 2.5 mg by mouth daily at 6 PM. ) 30 tablet 3  . tamsulosin (FLOMAX) 0.4 MG CAPS capsule Take 1 capsule (0.4 mg total) by mouth daily. (Patient not taking: Reported on 06/08/2015) 30 capsule 0   No current facility-administered medications for this visit.    Physical Exam BP 140/90 mmHg  Pulse 68  Resp 20  Ht 5\' 7"  (1.702 m)  Wt 200 lb (90.719 kg)  BMI 31.32 kg/m2  SpO73 62% 64 year old man in no acute distress Well-developed and well-nourished Alert and oriented 3 with no focal deficits Cardiac regular rate and rhythm normal S1 and S2 no rubs or murmurs Lungs clear with equal breath sounds bilaterally Sternum stable, incision clean dry and intact Leg incision healing well No peripheral edema  Diagnostic Tests: I personally reviewed his chest x-ray. Shows postoperative changes. There is a tiny left pleural effusion.  Impression: 64 year old man who underwent emergency coronary bypass grafting 4 on 05/03/2015 for ST  elevation MI. He had atrial fibrillation and flutter postoperatively. He is on amiodarone and Coumadin for that. He is doing well at this time.  He is not to lift anything over 10 pounds for another 2 weeks.  He may begin driving on a limited basis. He should limit himself to short trips around town, and avoid Highway driving or high speeds. He works as a Production designer, theatre/television/film. I think he'll be ready to go back to that after the first of the year.  He has an appointment with Dr. Fletcher Anon on the 28th of this month to check his rhythm and see if he needs to continue with amiodarone and Coumadin.  Plan: Follow-up with Dr. Fletcher Anon  as scheduled  I'll be happy to see him back any time the future fingertip any further assistance with his care.  [name], MD [facility] [phone]

## 2015-06-11 ENCOUNTER — Encounter: Payer: 59 | Attending: Thoracic Surgery (Cardiothoracic Vascular Surgery) | Admitting: *Deleted

## 2015-06-11 DIAGNOSIS — I213 ST elevation (STEMI) myocardial infarction of unspecified site: Secondary | ICD-10-CM | POA: Diagnosis not present

## 2015-06-11 DIAGNOSIS — Z951 Presence of aortocoronary bypass graft: Secondary | ICD-10-CM | POA: Insufficient documentation

## 2015-06-11 NOTE — Progress Notes (Signed)
Daily Session Note  Patient Details  Name: [name] MRN: [id number] Date of Birth: 11-Nov-1950 Referring Provider:  Birdie Sons, MD  Encounter Date: 06/11/2015  Check In:     Session Check In - 06/11/15 0904    Check-In   Staff Present Heath Lark RN, BSN, CCRP;Renee Dillard Essex MS, ACSM CEP Exercise Physiologist;Carroll Enterkin RN, BSN   ER physicians immediately available to respond to emergencies See telemetry face sheet for immediately available ER MD   Medication changes reported     No   Fall or balance concerns reported    No   Warm-up and Cool-down Performed on first and last piece of equipment   VAD Patient? No   Pain Assessment   Currently in Pain? No/denies         Goals Met:  Exercise tolerated well No report of cardiac concerns or symptoms Strength training completed today  Goals Unmet:  Not Applicable  Goals Comments: Has completed first week in the program. Dr. Emily Filbert is Medical Director for [place] and [facility].

## 2015-06-14 ENCOUNTER — Encounter: Payer: Self-pay | Admitting: *Deleted

## 2015-06-14 ENCOUNTER — Encounter: Payer: 59 | Admitting: *Deleted

## 2015-06-14 DIAGNOSIS — I213 ST elevation (STEMI) myocardial infarction of unspecified site: Secondary | ICD-10-CM | POA: Diagnosis not present

## 2015-06-14 DIAGNOSIS — Z951 Presence of aortocoronary bypass graft: Secondary | ICD-10-CM

## 2015-06-14 NOTE — Progress Notes (Signed)
Cardiac Individual Treatment Plan  Patient Details  Name: [name] MRN: [id number] Date of Birth: 03-01-1951 Referring Provider:  [name], *  Initial Encounter Date:    Visit Diagnosis: ST elevation myocardial infarction (STEMI), unspecified artery ([place])  S/P CABG x 4  Patient's Home Medications on Admission:  Current outpatient prescriptions:  .  amiodarone (PACERONE) 200 MG tablet, Take 1 tablet (200 mg total) by mouth 2 (two) times daily., Disp: 60 tablet, Rfl: 1 .  aspirin 81 MG EC tablet, Take 1 tablet (81 mg total) by mouth daily., Disp: , Rfl:  .  atorvastatin (LIPITOR) 80 MG tablet, Take 1 tablet (80 mg total) by mouth daily at 6 PM., Disp: 90 tablet, Rfl: 3 .  ENSURE PLUS (ENSURE PLUS) LIQD, Take 237 mLs by mouth daily., Disp: , Rfl:  .  lisinopril (PRINIVIL,ZESTRIL) 5 MG tablet, Take 1 tablet (5 mg total) by mouth daily., Disp: 90 tablet, Rfl: 3 .  tamsulosin (FLOMAX) 0.4 MG CAPS capsule, Take 1 capsule (0.4 mg total) by mouth daily. (Patient not taking: Reported on 06/08/2015), Disp: 30 capsule, Rfl: 0 .  warfarin (COUMADIN) 5 MG tablet, Take 1 tablet (5 mg total) by mouth daily at 6 PM. (Patient taking differently: Take 2.5 mg by mouth daily at 6 PM. ), Disp: 30 tablet, Rfl: 3  Past Medical History: Past Medical History  Diagnosis Date  . Kidney stones     a. May 2016.  Marland Kitchen Coronary atherosclerosis     a. CT scan 01/02/2015: extensive coronary atherosclerosis;  b. 04/2015 Inf STEMI/Cath/CABG x 4: LIMA->LAD, VG->Diag, VG->OM2, VG->RPL.  Marland Kitchen HTN (hypertension)   . HLD (hyperlipidemia)   . Postoperative atrial fibrillation ([place])     a. 04/2015 Post-op CABG AF/AFlutter--> converted with rapid atrial pacing and amio; b. CHA2DS2VASc = 3->Coumadin.  . Complete heart block ([place])     a. 04/2015 in setting of inferior STEMI-->resolved.  . Ischemic cardiomyopathy     a. 04/2015 LV gram: EF 35-40%.    Tobacco Use: History  Smoking status  . Never Smoker    Smokeless tobacco  . Not on file    Labs: Recent Review Flowsheet Data    Labs for ITP Cardiac and Pulmonary Rehab Latest Ref Rng 05/03/2015 05/03/2015 05/04/2015 05/04/2015 05/04/2015   PHART 7.350 - 7.450 - 7.309(L) 7.404 7.429 -   PCO2ART 35.0 - 45.0 mmHg - 44.0 39.7 42.0 -   HCO3 20.0 - 24.0 mEq/L - 22.3 24.9(H) 27.9(H) -   TCO2 0 - 100 mmol/L '21 24 26 29 24   ' ACIDBASEDEF 0.0 - 2.0 mmol/L - 4.0(H) - - -   O2SAT - - 89.0 97.0 96.0 -       Exercise Target Goals:    Exercise Program Goal: Individual exercise prescription set with THRR, safety & activity barriers. Participant demonstrates ability to understand and report RPE using BORG scale, to self-measure pulse accurately, and to acknowledge the importance of the exercise prescription.  Exercise Prescription Goal: Starting with aerobic activity 30 plus minutes a day, 3 days per week for initial exercise prescription. Provide home exercise prescription and guidelines that participant acknowledges understanding prior to discharge.  Activity Barriers & Risk Stratification:     Activity Barriers & Risk Stratification - 06/01/15 1004    Activity Barriers & Risk Stratification   Activity Barriers Right Hip Replacement      6 Minute Walk:     6 Minute Walk      06/01/15 1053  6 Minute Walk   Phase Initial     Distance 1170 feet     Walk Time 6 minutes     Resting HR 65 bpm     Resting BP 112/62 mmHg     Max Ex. HR 101 bpm     Max Ex. BP 114/54 mmHg     RPE 12     Symptoms No        Initial Exercise Prescription:     Initial Exercise Prescription - 06/01/15 1100    Date of Initial Exercise Prescription   Date 06/01/15   Treadmill   MPH 2   Grade 0   Minutes 10   Bike   Level 0.2   Minutes 15   Recumbant Bike   Level 3   RPM 40   Watts 25   Minutes 15   NuStep   Level 3   Watts 30   Minutes 15   Arm Ergometer   Level 1   Watts 10   Minutes 10   Arm/Foot Ergometer   Level 4   Watts 15    Minutes 10   Cybex   Level 3   RPM 50   Minutes 10   Recumbant Elliptical   Level 1   RPM 40   Watts 10   Minutes 15   Elliptical   Level 1   Speed 3   Minutes 1   REL-XR   Level 3   Watts 40   Minutes 15   Prescription Details   Frequency (times per week) 3   Duration Progress to 30 minutes of continuous aerobic without signs/symptoms of physical distress   Intensity   THRR REST +  30   Ratings of Perceived Exertion 11-15   Progression Continue progressive overload as per policy without signs/symptoms or physical distress.   Resistance Training   Training Prescription Yes   Weight 2   Reps 10-15      Exercise Prescription Changes:     Exercise Prescription Changes      06/08/15 0700           Exercise Review   Progression No  Has not started program yet; med review was on 06/01/15          Discharge Exercise Prescription (Final Exercise Prescription Changes):     Exercise Prescription Changes - 06/08/15 0700    Exercise Review   Progression No  Has not started program yet; med review was on 06/01/15      Nutrition:  Target Goals: Understanding of nutrition guidelines, daily intake of sodium <1525m, cholesterol <2039m calories 30% from fat and 7% or less from saturated fats, daily to have 5 or more servings of fruits and vegetables.  Biometrics:     Pre Biometrics - 06/01/15 1053    Pre Biometrics   Height 5' 7.25" (1.708 m)   Weight 200 lb 8 oz (90.946 kg)   Waist Circumference 42 inches   Hip Circumference 40.5 inches   Waist to Hip Ratio 1.04 %   BMI (Calculated) 31.2       Nutrition Therapy Plan and Nutrition Goals:   Nutrition Discharge: Rate Your Plate Scores:   Nutrition Goals Re-Evaluation:   Psychosocial: Target Goals: Acknowledge presence or absence of depression, maximize coping skills, provide positive support system. Participant is able to verbalize types and ability to use techniques and skills needed for reducing  stress and depression.  Initial Review & Psychosocial Screening:  Initial Psych Review & Screening - 06/01/15 1007    Initial Review   Current issues with Current Sleep Concerns  Sleeping concerns when first discharged. Has improved since healing process  has made him able to sleep on his side.    Family Dynamics   Good Support System? --  Good support system at home. Wife,daughter and son.   Barriers   Psychosocial barriers to participate in program There are no identifiable barriers or psychosocial needs.;The patient should benefit from training in stress management and relaxation.   Screening Interventions   Interventions Encouraged to exercise;Program counselor consult      Quality of Life Scores:     Quality of Life - 06/01/15 1536    Quality of Life Scores   Health/Function Pre 23.83 %   Socioeconomic Pre 30 %   Psych/Spiritual Pre 30 %   Family Pre 30 %   GLOBAL Pre 27.36 %      PHQ-9:     Recent Review Flowsheet Data    Depression screen [facility] 2/9 06/01/2015   Decreased Interest 0   Down, Depressed, Hopeless 0   PHQ - 2 Score 0   Altered sleeping 3   Tired, decreased energy 1   Change in appetite 0   Feeling bad or failure about yourself  0   Trouble concentrating 0   Moving slowly or fidgety/restless 0   Suicidal thoughts 0   PHQ-9 Score 4   Difficult doing work/chores Not difficult at all      Psychosocial Evaluation and Intervention:     Psychosocial Evaluation - 06/14/15 1015    Psychosocial Evaluation & Interventions   Interventions Stress management education;Relaxation education;Encouraged to exercise with the program and follow exercise prescription   Comments Counselor met with [name] today for initial psychosocial evaluation.  He is a 64 year old who had quadruple bypass surgery in September.  He has a strong support system with a spouse of 91 years and (2) adult children close by.  [name] is generally healthy other than his  cardiac issues, and formerly had a hip replacement surgery and some kidney stones, but has recovered well and quickly from these.  He states he is sleeping well and has a good appetite.   [name] reports no history of depression or anxiety currently or in the past.  He states he is generally in a positive mood.  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  He has some concern about when and how much to work in the future.  His goals for this program are to build up his stamina and strength and to eat healthier.   He has an appointment to meet with the dietician next week.  [name] has (3) grandchildren and he walks the youngest in the stroller almost daily.  He plans to continue walking and incorporate biking in his exercise regimen following this program.     Continued Psychosocial Services Needed --  [name] will benefit from consistent exercise and the psychoeducational components of this program.        Psychosocial Re-Evaluation:   Vocational Rehabilitation: Provide vocational rehab assistance to qualifying candidates.   Vocational Rehab Evaluation & Intervention:     Vocational Rehab - 06/01/15 1004    Initial Vocational Rehab Evaluation & Intervention   Assessment shows need for Vocational Rehabilitation No  Education: Education Goals: Education classes will be provided on a weekly basis, covering required topics. Participant will state understanding/return demonstration of topics presented.  Learning Barriers/Preferences:     Learning Barriers/Preferences - 06/01/15 1004    Learning Barriers/Preferences   Learning Barriers None   Learning Preferences Group Instruction      Education Topics: General Nutrition Guidelines/Fats and Fiber: -Group instruction provided by verbal, written material, models and posters to present the general guidelines for heart healthy  nutrition. Gives an explanation and review of dietary fats and fiber.   Controlling Sodium/Reading Food Labels: -Group verbal and written material supporting the discussion of sodium use in heart healthy nutrition. Review and explanation with models, verbal and written materials for utilization of the food label.   Exercise Physiology & Risk Factors: - Group verbal and written instruction with models to review the exercise physiology of the cardiovascular system and associated critical values. Details cardiovascular disease risk factors and the goals associated with each risk factor.   Aerobic Exercise & Resistance Training: - Gives group verbal and written discussion on the health impact of inactivity. On the components of aerobic and resistive training programs and the benefits of this training and how to safely progress through these programs.          Cardiac Rehab from 06/14/2015 in [facility]   Date  06/14/15   Educator  RM   Instruction Review Code  2- meets goals/outcomes      Flexibility, Balance, General Exercise Guidelines: - Provides group verbal and written instruction on the benefits of flexibility and balance training programs. Provides general exercise guidelines with specific guidelines to those with heart or lung disease. Demonstration and skill practice provided.   Stress Management: - Provides group verbal and written instruction about the health risks of elevated stress, cause of high stress, and healthy ways to reduce stress.   Depression: - Provides group verbal and written instruction on the correlation between heart/lung disease and depressed mood, treatment options, and the stigmas associated with seeking treatment.   Anatomy & Physiology of the Heart: - Group verbal and written instruction and models provide basic cardiac anatomy and physiology, with the coronary electrical and arterial systems. Review of: AMI, Angina, Valve disease, Heart Failure,  Cardiac Arrhythmia, Pacemakers, and the ICD.   Cardiac Procedures: - Group verbal and written instruction and models to describe the testing methods done to diagnose heart disease. Reviews the outcomes of the test results. Describes the treatment choices: Medical Management, Angioplasty, or Coronary Bypass Surgery.   Cardiac Medications: - Group verbal and written instruction to review commonly prescribed medications for heart disease. Reviews the medication, class of the drug, and side effects. Includes the steps to properly store meds and maintain the prescription regimen.   Go Sex-Intimacy & Heart Disease, Get SMART - Goal Setting: - Group verbal and written instruction through game format to discuss heart disease and the return to sexual intimacy. Provides group verbal and written material to discuss and apply goal setting through the application of the S.M.A.R.T. Method.   Other Matters of the Heart: - Provides group verbal, written materials and models to describe Heart Failure, Angina, Valve Disease, and Diabetes in the realm of heart disease. Includes description of the disease process and treatment options available to the cardiac patient.   Exercise & Equipment Safety: - Individual verbal instruction and demonstration of equipment use and safety with use of the equipment.      Cardiac Rehab from 06/14/2015 in  [facility]   Date  06/01/15   Educator  Sb   Instruction Review Code  2- meets goals/outcomes      Infection Prevention: - Provides verbal and written material to individual with discussion of infection control including proper hand washing and proper equipment cleaning during exercise session.      Cardiac Rehab from 06/14/2015 in [facility]   Date  06/01/15   Educator  SB   Instruction Review Code  2- meets goals/outcomes      Falls Prevention: - Provides verbal and written material to individual with discussion of falls prevention and safety.       Cardiac Rehab from 06/14/2015 in [facility]   Date  06/01/15   Educator  SB   Instruction Review Code  2- meets goals/outcomes      Diabetes: - Individual verbal and written instruction to review signs/symptoms of diabetes, desired ranges of glucose level fasting, after meals and with exercise. Advice that pre and post exercise glucose checks will be done for 3 sessions at entry of program.    Knowledge Questionnaire Score:     Knowledge Questionnaire Score - 06/01/15 1536    Knowledge Questionnaire Score   Pre Score 21/28      Personal Goals and Risk Factors at Admission:     Personal Goals and Risk Factors at Admission - 06/01/15 1008    Personal Goals and Risk Factors on Admission    Weight Management Yes   Intervention Learn and follow the exercise and diet guidelines while in the program. Utilize the nutrition and education classes to help gain knowledge of the diet and exercise expectations in the program   Admit Weight 192 lb (87.091 kg)   Goal Weight 185 lb (83.915 kg)   Increase Aerobic Exercise and Physical Activity Yes;Sedentary  HAd physically active job, no routine exercise program.   Take Less Medication Yes   Intervention Learn your risk factors and begin the lifestyle modifications for risk factor control during your time in the program.   Understand more about Heart/Pulmonary Disease. Yes   Intervention While in program utilize professionals for any questions, and attend the education sessions. Great websites to use are www.americanheart.org or www.lung.org for reliable information.   Diabetes No   Hypertension Yes   Goal Participant will see blood pressure controlled within the values of 140/73m/Hg or within value directed by their physician.   Intervention Provide nutrition & aerobic exercise along with prescribed medications to achieve BP 140/90 or less.   Lipids Yes   Goal Cholesterol controlled with medications as prescribed, with individualized  exercise RX and with personalized nutrition plan. Value goals: LDL < 739m HDL > 406mParticipant states understanding of desired cholesterol values and following prescriptions.   Intervention Provide nutrition & aerobic exercise along with prescribed medications to achieve LDL <8m37mDL >40mg71mStress No      Personal Goals and Risk Factors Review:    Personal Goals Discharge (Final Personal Goals and Risk Factors Review):     Comments: 30 day review. Continue with ITP. Has attended a few sessions since 11/4

## 2015-06-14 NOTE — Progress Notes (Signed)
Daily Session Note  Patient Details  Name: [name] MRN: [id number] Date of Birth: [birth year] Referring Provider:  [name], *  Encounter Date: 06/14/2015  Check In:     Session Check In - 06/14/15 0849    Check-In   Staff Present Candiss Norse MS, ACSM CEP Exercise Physiologist;Susanne Bice RN, BSN, CCRP;[place] Svehla Alfonso Patten, ACSM CEP Exercise Physiologist   ER physicians immediately available to respond to emergencies See telemetry face sheet for immediately available ER MD   Medication changes reported     No   Fall or balance concerns reported    No   Warm-up and Cool-down Performed on first and last piece of equipment   VAD Patient? No   Pain Assessment   Currently in Pain? No/denies   Multiple Pain Sites No         Goals Met:  Independence with exercise equipment Exercise tolerated well No report of cardiac concerns or symptoms Strength training completed today  Goals Unmet:  Not Applicable  Goals Comments: Patient completed exercise prescription and all exercise goals during rehab session. The exercise was tolerated well and the patient is progressing in the program.     Dr. Emily Filbert is Medical Director for [place] and [facility].

## 2015-06-16 ENCOUNTER — Other Ambulatory Visit: Payer: Self-pay | Admitting: *Deleted

## 2015-06-16 ENCOUNTER — Ambulatory Visit (INDEPENDENT_AMBULATORY_CARE_PROVIDER_SITE_OTHER): Payer: 59 | Admitting: *Deleted

## 2015-06-16 DIAGNOSIS — Z5181 Encounter for therapeutic drug level monitoring: Secondary | ICD-10-CM | POA: Diagnosis not present

## 2015-06-16 DIAGNOSIS — I4891 Unspecified atrial fibrillation: Secondary | ICD-10-CM | POA: Diagnosis not present

## 2015-06-16 DIAGNOSIS — Z951 Presence of aortocoronary bypass graft: Secondary | ICD-10-CM

## 2015-06-16 DIAGNOSIS — I213 ST elevation (STEMI) myocardial infarction of unspecified site: Secondary | ICD-10-CM | POA: Diagnosis not present

## 2015-06-16 LAB — POCT INR: INR: 2.1

## 2015-06-16 NOTE — Progress Notes (Signed)
Daily Session Note  Patient Details  Name: [name] MRN: [id number] Date of Birth: [birth year] Referring Provider:  [name], *  Encounter Date: 06/16/2015  Check In:     Session Check In - 06/16/15 0838    Check-In   Staff Present Heath Lark RN, BSN, CCRP;Egbert Seidel BS, ACSM EP-C, Exercise Physiologist;Renee Dillard [place] MS, ACSM CEP Exercise Physiologist   ER physicians immediately available to respond to emergencies See telemetry face sheet for immediately available ER MD   Medication changes reported     No   Fall or balance concerns reported    No   Warm-up and Cool-down Performed on first and last piece of equipment   VAD Patient? No   Pain Assessment   Currently in Pain? No/denies         Goals Met:  Proper associated with RPD/PD & O2 Sat Exercise tolerated well No report of cardiac concerns or symptoms Strength training completed today  Goals Unmet:  Not Applicable  Goals Comments:   Dr. Emily Filbert is Medical Director for [place] and [facility].

## 2015-06-18 ENCOUNTER — Encounter: Payer: 59 | Admitting: *Deleted

## 2015-06-18 DIAGNOSIS — I213 ST elevation (STEMI) myocardial infarction of unspecified site: Secondary | ICD-10-CM

## 2015-06-18 DIAGNOSIS — Z951 Presence of aortocoronary bypass graft: Secondary | ICD-10-CM | POA: Diagnosis not present

## 2015-06-18 NOTE — Progress Notes (Signed)
Daily Session Note  Patient Details  Name: [name] MRN: [id number] Date of Birth: 1951/03/21 Referring Provider:  [name], *  Encounter Date: 06/18/2015  Check In:     Session Check In - 06/18/15 0933    Check-In   Staff Present Heath Lark RN, BSN, CCRP;Carroll Enterkin RN, Drusilla Kanner MS, ACSM CEP Exercise Physiologist   ER physicians immediately available to respond to emergencies See telemetry face sheet for immediately available ER MD   Medication changes reported     No   Fall or balance concerns reported    No   Warm-up and Cool-down Performed on first and last piece of equipment   VAD Patient? No   Pain Assessment   Currently in Pain? No/denies         Goals Met:  Independence with exercise equipment Exercise tolerated well No report of cardiac concerns or symptoms Strength training completed today  Goals Unmet:  Not Applicable  Goals Comments:     Dr. Emily Filbert is Medical Director for [place] and [facility].

## 2015-06-21 ENCOUNTER — Encounter: Payer: 59 | Admitting: *Deleted

## 2015-06-21 DIAGNOSIS — Z951 Presence of aortocoronary bypass graft: Secondary | ICD-10-CM

## 2015-06-21 DIAGNOSIS — I213 ST elevation (STEMI) myocardial infarction of unspecified site: Secondary | ICD-10-CM

## 2015-06-21 NOTE — Progress Notes (Signed)
Daily Session Note  Patient Details  Name: [name] MRN: [id number] Date of Birth: 02-07-1951 Referring Provider:  [name], *  Encounter Date: 06/21/2015  Check In:     Session Check In - 06/21/15 0846    Check-In   Staff Present Candiss Norse MS, ACSM CEP Exercise Physiologist;Susanne Bice RN, BSN, CCRP;Phillip Sandler Alfonso Patten, ACSM CEP Exercise Physiologist   ER physicians immediately available to respond to emergencies See telemetry face sheet for immediately available ER MD   Medication changes reported     No   Fall or balance concerns reported    No   Warm-up and Cool-down Performed on first and last piece of equipment   VAD Patient? No   Pain Assessment   Currently in Pain? No/denies   Multiple Pain Sites No         Goals Met:  Independence with exercise equipment Exercise tolerated well No report of cardiac concerns or symptoms Strength training completed today  Goals Unmet:  Not Applicable  Goals Comments: Patient completed exercise prescription and all exercise goals during rehab session. The exercise was tolerated well and the patient is progressing in the program.     Dr. Emily Filbert is Medical Director for [place] and [facility].

## 2015-06-23 ENCOUNTER — Encounter: Payer: 59 | Admitting: *Deleted

## 2015-06-23 DIAGNOSIS — I213 ST elevation (STEMI) myocardial infarction of unspecified site: Secondary | ICD-10-CM | POA: Diagnosis not present

## 2015-06-23 DIAGNOSIS — Z951 Presence of aortocoronary bypass graft: Secondary | ICD-10-CM | POA: Diagnosis not present

## 2015-06-23 NOTE — Progress Notes (Signed)
Daily Session Note  Patient Details  Name: [name] MRN: [id number] Date of Birth: 05/21/1951 Referring Provider:  [name], *  Encounter Date: 06/23/2015  Check In:     Session Check In - 06/23/15 0839    Check-In   Staff Present Candiss Norse MS, ACSM CEP Exercise Physiologist;Carroll Enterkin RN, BSN;Steven Way BS, ACSM EP-C, Exercise Physiologist   ER physicians immediately available to respond to emergencies See telemetry face sheet for immediately available ER MD   Medication changes reported     No   Fall or balance concerns reported    No   Warm-up and Cool-down Performed on first and last piece of equipment   VAD Patient? No   Pain Assessment   Currently in Pain? No/denies   Multiple Pain Sites No           Exercise Prescription Changes - 06/23/15 0800    Exercise Review   Progression Yes   Response to Exercise   Symptoms None   Comments Reviewed individualized exercise prescription and made increases per departmental policy. Exercise increases were discussed with the patient and they were able to perform the new work loads without issue (no signs or symptoms).    Duration Progress to 50 minutes of aerobic without signs/symptoms of physical distress   Intensity Rest + 30   Progression Continue progressive overload as per policy without signs/symptoms or physical distress.   Resistance Training   Training Prescription Yes   Weight 4   Reps 10-15   Interval Training   Interval Training No   Treadmill   MPH 2.6   Grade 1   Minutes 15   Recumbant Bike   Level 7   RPM 50   Minutes 20      Goals Met:  Independence with exercise equipment Exercise tolerated well Personal goals reviewed No report of cardiac concerns or symptoms Strength training completed today  Goals Unmet:  Not Applicable  Goals Comments: Patient completed exercise prescription and all exercise goals during rehab session. The exercise was tolerated well and the  patient is progressing in the program.    Dr. Emily Filbert is Medical Director for [place] and [facility].

## 2015-06-23 NOTE — Progress Notes (Signed)
Cardiac Individual Treatment Plan  Patient Details  Name: [name] MRN: [id number] Date of Birth: [birth year] Referring Provider:  [name], *  Initial Encounter Date:  06/01/2015  Visit Diagnosis: S/P CABG x 4  Patient's Home Medications on Admission:  Current outpatient prescriptions:  .  amiodarone (PACERONE) 200 MG tablet, Take 1 tablet (200 mg total) by mouth 2 (two) times daily., Disp: 60 tablet, Rfl: 1 .  aspirin 81 MG EC tablet, Take 1 tablet (81 mg total) by mouth daily., Disp: , Rfl:  .  atorvastatin (LIPITOR) 80 MG tablet, Take 1 tablet (80 mg total) by mouth daily at 6 PM., Disp: 90 tablet, Rfl: 3 .  ENSURE PLUS (ENSURE PLUS) LIQD, Take 237 mLs by mouth daily., Disp: , Rfl:  .  lisinopril (PRINIVIL,ZESTRIL) 5 MG tablet, Take 1 tablet (5 mg total) by mouth daily., Disp: 90 tablet, Rfl: 3 .  tamsulosin (FLOMAX) 0.4 MG CAPS capsule, Take 1 capsule (0.4 mg total) by mouth daily. (Patient not taking: Reported on 06/08/2015), Disp: 30 capsule, Rfl: 0 .  warfarin (COUMADIN) 5 MG tablet, Take 1 tablet (5 mg total) by mouth daily at 6 PM. (Patient taking differently: Take 2.5 mg by mouth daily at 6 PM. ), Disp: 30 tablet, Rfl: 3  Past Medical History: Past Medical History  Diagnosis Date  . Kidney stones     a. May 2016.  Marland Kitchen Coronary atherosclerosis     a. CT scan 01/02/2015: extensive coronary atherosclerosis;  b. 04/2015 Inf STEMI/Cath/CABG x 4: LIMA->LAD, VG->Diag, VG->OM2, VG->RPL.  Marland Kitchen HTN (hypertension)   . HLD (hyperlipidemia)   . Postoperative atrial fibrillation ([place])     a. 04/2015 Post-op CABG AF/AFlutter--> converted with rapid atrial pacing and amio; b. CHA2DS2VASc = 3->Coumadin.  . Complete heart block ([place])     a. 04/2015 in setting of inferior STEMI-->resolved.  . Ischemic cardiomyopathy     a. 04/2015 LV gram: EF 35-40%.    Tobacco Use: History  Smoking status  . Never Smoker   Smokeless tobacco  . Not on file    Labs: Recent Review  Flowsheet Data    Labs for ITP Cardiac and Pulmonary Rehab Latest Ref Rng 05/03/2015 05/03/2015 05/04/2015 05/04/2015 05/04/2015   PHART 7.350 - 7.450 - 7.309(L) 7.404 7.429 -   PCO2ART 35.0 - 45.0 mmHg - 44.0 39.7 42.0 -   HCO3 20.0 - 24.0 mEq/L - 22.3 24.9(H) 27.9(H) -   TCO2 0 - 100 mmol/L '21 24 26 29 24   ' ACIDBASEDEF 0.0 - 2.0 mmol/L - 4.0(H) - - -   O2SAT - - 89.0 97.0 96.0 -       Exercise Target Goals:    Exercise Program Goal: Individual exercise prescription set with THRR, safety & activity barriers. Participant demonstrates ability to understand and report RPE using BORG scale, to self-measure pulse accurately, and to acknowledge the importance of the exercise prescription.  Exercise Prescription Goal: Starting with aerobic activity 30 plus minutes a day, 3 days per week for initial exercise prescription. Provide home exercise prescription and guidelines that participant acknowledges understanding prior to discharge.  Activity Barriers & Risk Stratification:     Activity Barriers & Risk Stratification - 06/01/15 1004    Activity Barriers & Risk Stratification   Activity Barriers Right Hip Replacement      6 Minute Walk:     6 Minute Walk      06/01/15 1053       6 Minute Walk  Phase Initial     Distance 1170 feet     Walk Time 6 minutes     Resting HR 65 bpm     Resting BP 112/62 mmHg     Max Ex. HR 101 bpm     Max Ex. BP 114/54 mmHg     RPE 12     Symptoms No        Initial Exercise Prescription:     Initial Exercise Prescription - 06/01/15 1100    Date of Initial Exercise Prescription   Date 06/01/15   Treadmill   MPH 2   Grade 0   Minutes 10   Bike   Level 0.2   Minutes 15   Recumbant Bike   Level 3   RPM 40   Watts 25   Minutes 15   NuStep   Level 3   Watts 30   Minutes 15   Arm Ergometer   Level 1   Watts 10   Minutes 10   Arm/Foot Ergometer   Level 4   Watts 15   Minutes 10   Cybex   Level 3   RPM 50   Minutes 10    Recumbant Elliptical   Level 1   RPM 40   Watts 10   Minutes 15   Elliptical   Level 1   Speed 3   Minutes 1   REL-XR   Level 3   Watts 40   Minutes 15   Prescription Details   Frequency (times per week) 3   Duration Progress to 30 minutes of continuous aerobic without signs/symptoms of physical distress   Intensity   THRR REST +  30   Ratings of Perceived Exertion 11-15   Progression Continue progressive overload as per policy without signs/symptoms or physical distress.   Resistance Training   Training Prescription Yes   Weight 2   Reps 10-15      Exercise Prescription Changes:     Exercise Prescription Changes      06/08/15 0700 06/16/15 0800 06/23/15 0800       Exercise Review   Progression No  Has not started program yet; med review was on 06/01/15 Yes Yes     Response to Exercise   Symptoms   None     Comments   Reviewed individualized exercise prescription and made increases per departmental policy. Exercise increases were discussed with the patient and they were able to perform the new work loads without issue (no signs or symptoms).      Duration   Progress to 50 minutes of aerobic without signs/symptoms of physical distress     Intensity   Rest + 30     Progression   Continue progressive overload as per policy without signs/symptoms or physical distress.     Resistance Training   Training Prescription   Yes     Weight   4     Reps   10-15     Interval Training   Interval Training   No     Treadmill   MPH  2.2 2.6     Grade  0 1     Minutes  15 15     Recumbant Bike   Level  7 7     RPM  50 50     Minutes  20 20        Discharge Exercise Prescription (Final Exercise Prescription Changes):     Exercise Prescription Changes - 06/23/15 0800  Exercise Review   Progression Yes   Response to Exercise   Symptoms None   Comments Reviewed individualized exercise prescription and made increases per departmental policy. Exercise increases were  discussed with the patient and they were able to perform the new work loads without issue (no signs or symptoms).    Duration Progress to 50 minutes of aerobic without signs/symptoms of physical distress   Intensity Rest + 30   Progression Continue progressive overload as per policy without signs/symptoms or physical distress.   Resistance Training   Training Prescription Yes   Weight 4   Reps 10-15   Interval Training   Interval Training No   Treadmill   MPH 2.6   Grade 1   Minutes 15   Recumbant Bike   Level 7   RPM 50   Minutes 20      Nutrition:  Target Goals: Understanding of nutrition guidelines, daily intake of sodium <1533m, cholesterol <2070m calories 30% from fat and 7% or less from saturated fats, daily to have 5 or more servings of fruits and vegetables.  Biometrics:     Pre Biometrics - 06/01/15 1053    Pre Biometrics   Height 5' 7.25" (1.708 m)   Weight 200 lb 8 oz (90.946 kg)   Waist Circumference 42 inches   Hip Circumference 40.5 inches   Waist to Hip Ratio 1.04 %   BMI (Calculated) 31.2       Nutrition Therapy Plan and Nutrition Goals:     Nutrition Therapy & Goals - 06/21/15 1459    Nutrition Therapy   Diet   Instructed on a meal plan based on 1700 calories; DASH diet principles, Patient's wife was present for instruction.   Drug/Food Interactions Coumadin/Vit K;Statins/Certain Fruits   Fiber 30 grams   Whole Grain Foods 3 servings   Protein 7 ounces/day   Saturated Fats 11 max. grams   Fruits and Vegetables 5 servings/day   Personal Nutrition Goals   Personal Goal #1 Read labels for saturated fat, trans fat and sodium.   Personal Goal #2 Rinse canned vegetables or try "no salt added" canned vegetables, fresh or frozen vegetables.   Personal Goal #3 To increase fruit to 1-2 servings per day.      Nutrition Discharge: Rate Your Plate Scores:     Rate Your Plate - 1128/78/6756720  Rate Your Plate Scores   Pre Score 58   Pre Score % 64  %      Nutrition Goals Re-Evaluation:   Psychosocial: Target Goals: Acknowledge presence or absence of depression, maximize coping skills, provide positive support system. Participant is able to verbalize types and ability to use techniques and skills needed for reducing stress and depression.  Initial Review & Psychosocial Screening:     Initial Psych Review & Screening - 06/01/15 1007    Initial Review   Current issues with Current Sleep Concerns  Sleeping concerns when first discharged. Has improved since healing process  has made him able to sleep on his side.    Family Dynamics   Good Support System? --  Good support system at home. Wife,daughter and son.   Barriers   Psychosocial barriers to participate in program There are no identifiable barriers or psychosocial needs.;The patient should benefit from training in stress management and relaxation.   Screening Interventions   Interventions Encouraged to exercise;Program counselor consult      Quality of Life Scores:     Quality of Life - 06/01/15  1536    Quality of Life Scores   Health/Function Pre 23.83 %   Socioeconomic Pre 30 %   Psych/Spiritual Pre 30 %   Family Pre 30 %   GLOBAL Pre 27.36 %      PHQ-9:     Recent Review Flowsheet Data    Depression screen [facility] 2/9 06/01/2015   Decreased Interest 0   Down, Depressed, Hopeless 0   PHQ - 2 Score 0   Altered sleeping 3   Tired, decreased energy 1   Change in appetite 0   Feeling bad or failure about yourself  0   Trouble concentrating 0   Moving slowly or fidgety/restless 0   Suicidal thoughts 0   PHQ-9 Score 4   Difficult doing work/chores Not difficult at all      Psychosocial Evaluation and Intervention:     Psychosocial Evaluation - 06/14/15 1015    Psychosocial Evaluation & Interventions   Interventions Stress management education;Relaxation education;Encouraged to exercise with the program and follow exercise prescription   Comments  Counselor met with [name] today for initial psychosocial evaluation.  He is a 64 year old who had quadruple bypass surgery in September.  He has a strong support system with a spouse of 109 years and (2) adult children close by.  [name] is generally healthy other than his cardiac issues, and formerly had a hip replacement surgery and some kidney stones, but has recovered well and quickly from these.  He states he is sleeping well and has a good appetite.   [name] reports no history of depression or anxiety currently or in the past.  He states he is generally in a positive mood.  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  He has some concern about when and how much to work in the future.  His goals for this program are to build up his stamina and strength and to eat healthier.   He has an appointment to meet with the dietician next week.  [name] has (3) grandchildren and he walks the youngest in the stroller almost daily.  He plans to continue walking and incorporate biking in his exercise regimen following this program.     Continued Psychosocial Services Needed --  [name] will benefit from consistent exercise and the psychoeducational components of this program.        Psychosocial Re-Evaluation:     Psychosocial Re-Evaluation      06/23/15 1110           Psychosocial Re-Evaluation   Interventions Encouraged to attend Cardiac Rehabilitation for the exercise       Comments [name] talked about how when he gets stressed he has a shorter temper. Ways to help with stress where taught in the Stress Cardiac REhab education class today. 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           Vocational Rehabilitation: Provide vocational rehab assistance to qualifying candidates.   Vocational Rehab  Evaluation & Intervention:     Vocational Rehab - 06/01/15 1004    Initial Vocational Rehab Evaluation & Intervention   Assessment shows need for Vocational Rehabilitation No      Education: Education Goals: Education classes will be provided on a weekly basis, covering required topics.  Participant will state understanding/return demonstration of topics presented.  Learning Barriers/Preferences:     Learning Barriers/Preferences - 06/01/15 1004    Learning Barriers/Preferences   Learning Barriers None   Learning Preferences Group Instruction      Education Topics: General Nutrition Guidelines/Fats and Fiber: -Group instruction provided by verbal, written material, models and posters to present the general guidelines for heart healthy nutrition. Gives an explanation and review of dietary fats and fiber.   Controlling Sodium/Reading Food Labels: -Group verbal and written material supporting the discussion of sodium use in heart healthy nutrition. Review and explanation with models, verbal and written materials for utilization of the food label.   Exercise Physiology & Risk Factors: - Group verbal and written instruction with models to review the exercise physiology of the cardiovascular system and associated critical values. Details cardiovascular disease risk factors and the goals associated with each risk factor.   Aerobic Exercise & Resistance Training: - Gives group verbal and written discussion on the health impact of inactivity. On the components of aerobic and resistive training programs and the benefits of this training and how to safely progress through these programs.          Cardiac Rehab from 06/23/2015 in [facility] Cardiac Rehab   Date  06/14/15   Educator  RM   Instruction Review Code  2- meets goals/outcomes      Flexibility, Balance, General Exercise Guidelines: - Provides group verbal and written instruction on the benefits of flexibility and balance training  programs. Provides general exercise guidelines with specific guidelines to those with heart or lung disease. Demonstration and skill practice provided.      Cardiac Rehab from 06/23/2015 in [facility] Cardiac Rehab   Date  06/21/15   Educator  RM   Instruction Review Code  2- meets goals/outcomes      Stress Management: - Provides group verbal and written instruction about the health risks of elevated stress, cause of high stress, and healthy ways to reduce stress.      Cardiac Rehab from 06/23/2015 in [facility] Cardiac Rehab   Date  06/23/15   Educator  [facility]   Instruction Review Code  2- meets goals/outcomes      Depression: - Provides group verbal and written instruction on the correlation between heart/lung disease and depressed mood, treatment options, and the stigmas associated with seeking treatment.   Anatomy & Physiology of the Heart: - Group verbal and written instruction and models provide basic cardiac anatomy and physiology, with the coronary electrical and arterial systems. Review of: AMI, Angina, Valve disease, Heart Failure, Cardiac Arrhythmia, Pacemakers, and the ICD.   Cardiac Procedures: - Group verbal and written instruction and models to describe the testing methods done to diagnose heart disease. Reviews the outcomes of the test results. Describes the treatment choices: Medical Management, Angioplasty, or Coronary Bypass Surgery.   Cardiac Medications: - Group verbal and written instruction to review commonly prescribed medications for heart disease. Reviews the medication, class of the drug, and side effects. Includes the steps to properly store meds and maintain the prescription regimen.   Go Sex-Intimacy & Heart Disease, Get SMART - Goal Setting: - Group verbal and written instruction through game format to discuss heart disease and the return to sexual intimacy. Provides group verbal and written material to discuss and apply goal setting through the application of the  S.M.A.R.T. Method.   Other Matters of the Heart: - Provides group verbal, written materials and models to describe Heart Failure, Angina, Valve  Disease, and Diabetes in the realm of heart disease. Includes description of the disease process and treatment options available to the cardiac patient.   Exercise & Equipment Safety: - Individual verbal instruction and demonstration of equipment use and safety with use of the equipment.      Cardiac Rehab from 06/23/2015 in [facility] Cardiac Rehab   Date  06/01/15   Educator  Sb   Instruction Review Code  2- meets goals/outcomes      Infection Prevention: - Provides verbal and written material to individual with discussion of infection control including proper hand washing and proper equipment cleaning during exercise session.      Cardiac Rehab from 06/23/2015 in [facility] Cardiac Rehab   Date  06/01/15   Educator  SB   Instruction Review Code  2- meets goals/outcomes      Falls Prevention: - Provides verbal and written material to individual with discussion of falls prevention and safety.      Cardiac Rehab from 06/23/2015 in [facility] Cardiac Rehab   Date  06/01/15   Educator  SB   Instruction Review Code  2- meets goals/outcomes      Diabetes: - Individual verbal and written instruction to review signs/symptoms of diabetes, desired ranges of glucose level fasting, after meals and with exercise. Advice that pre and post exercise glucose checks will be done for 3 sessions at entry of program.    Knowledge Questionnaire Score:     Knowledge Questionnaire Score - 06/01/15 1536    Knowledge Questionnaire Score   Pre Score 21/28      Personal Goals and Risk Factors at Admission:     Personal Goals and Risk Factors at Admission - 06/01/15 1008    Personal Goals and Risk Factors on Admission    Weight Management Yes   Intervention Learn and follow the exercise and diet guidelines while in the program. Utilize the nutrition and education  classes to help gain knowledge of the diet and exercise expectations in the program   Admit Weight 192 lb (87.091 kg)   Goal Weight 185 lb (83.915 kg)   Increase Aerobic Exercise and Physical Activity Yes;Sedentary  HAd physically active job, no routine exercise program.   Take Less Medication Yes   Intervention Learn your risk factors and begin the lifestyle modifications for risk factor control during your time in the program.   Understand more about Heart/Pulmonary Disease. Yes   Intervention While in program utilize professionals for any questions, and attend the education sessions. Great websites to use are www.americanheart.org or www.lung.org for reliable information.   Diabetes No   Hypertension Yes   Goal Participant will see blood pressure controlled within the values of 140/62m/Hg or within value directed by their physician.   Intervention Provide nutrition & aerobic exercise along with prescribed medications to achieve BP 140/90 or less.   Lipids Yes   Goal Cholesterol controlled with medications as prescribed, with individualized exercise RX and with personalized nutrition plan. Value goals: LDL < 764m HDL > 4042mParticipant states understanding of desired cholesterol values and following prescriptions.   Intervention Provide nutrition & aerobic exercise along with prescribed medications to achieve LDL <28m83mDL >40mg47mStress No      Personal Goals and Risk Factors Review:      Goals and Risk Factor Review      06/23/15 1108           Increase Aerobic Exercise and Physical Activity   Goals Progress/Improvement seen  Yes       Comments Sricharan attends Cardiac Rehab regularly plus he said today that he is able to push his grandson in a stroller and usually walks a mile every day (including today when he attended Cardiac REhab already).        Hypertension   Goal --  Garret's blood presure is very stable at 120/70 today and other days similiar.        Abnormal Lipids    Goal --  Euan has follow up fasting blood work on Jul 05, 2015,          Personal Goals Discharge (Final Personal Goals and Risk Factors Review):      Goals and Risk Factor Review - 06/23/15 1108    Increase Aerobic Exercise and Physical Activity   Goals Progress/Improvement seen  Yes   Comments Aaro attends Cardiac Rehab regularly plus he said today that he is able to push his grandson in a stroller and usually walks a mile every day (including today when he attended Cardiac REhab already).    Hypertension   Goal --  Tiran's blood presure is very stable at 120/70 today and other days similiar.    Abnormal Lipids   Goal --  Knute has follow up fasting blood work on Jul 05, 2015,       Comments:

## 2015-06-25 ENCOUNTER — Encounter: Payer: 59 | Admitting: *Deleted

## 2015-06-25 DIAGNOSIS — I213 ST elevation (STEMI) myocardial infarction of unspecified site: Secondary | ICD-10-CM | POA: Diagnosis not present

## 2015-06-25 DIAGNOSIS — Z951 Presence of aortocoronary bypass graft: Secondary | ICD-10-CM

## 2015-06-25 NOTE — Progress Notes (Signed)
Daily Session Note  Patient Details  Name: TYREESE THAIN MRN: [id number] Date of Birth: Mar 31, 1951 Referring Provider:  [name], *  Encounter Date: 06/25/2015  Check In:     Session Check In - 06/25/15 0948    Check-In   Staff Present Candiss Norse MS, ACSM CEP Exercise Physiologist;Carroll Enterkin RN, BSN;Susanne Bice RN, BSN, [place]   ER physicians immediately available to respond to emergencies See telemetry face sheet for immediately available ER MD   Medication changes reported     No   Fall or balance concerns reported    No   Warm-up and Cool-down Performed on first and last piece of equipment   VAD Patient? No   Pain Assessment   Currently in Pain? No/denies   Multiple Pain Sites No         Goals Met:  Independence with exercise equipment Exercise tolerated well No report of cardiac concerns or symptoms Strength training completed today  Goals Unmet:  Not Applicable  Goals Comments: Patient completed exercise prescription and all exercise goals during rehab session. The exercise was tolerated well and the patient is progressing in the program.    Dr. Emily Filbert is Medical Director for [place] and [facility].

## 2015-06-28 DIAGNOSIS — Z951 Presence of aortocoronary bypass graft: Secondary | ICD-10-CM | POA: Diagnosis not present

## 2015-06-28 DIAGNOSIS — I213 ST elevation (STEMI) myocardial infarction of unspecified site: Secondary | ICD-10-CM | POA: Diagnosis not present

## 2015-06-28 NOTE — Progress Notes (Signed)
Daily Session Note  Patient Details  Name: [name] MRN: [id number] Date of Birth: 10-18-1950 Referring Provider:  [name], *  Encounter Date: 06/28/2015  Check In:     Session Check In - 06/28/15 0819    Check-In   Staff Present Heath Lark RN, BSN, CCRP;Kelly Hayes BS, ACSM CEP Exercise Physiologist;Lonzy Mato BS, ACSM EP-C, Exercise Physiologist   ER physicians immediately available to respond to emergencies See telemetry face sheet for immediately available ER MD   Medication changes reported     No   Fall or balance concerns reported    No   Warm-up and Cool-down Performed on first and last piece of equipment   VAD Patient? No   Pain Assessment   Currently in Pain? No/denies         Goals Met:  Proper associated with RPD/PD & O2 Sat Exercise tolerated well No report of cardiac concerns or symptoms Strength training completed today  Goals Unmet:  Not Applicable  Goals Comments:    Dr. Emily Filbert is Medical Director for [place] and [facility].

## 2015-06-30 DIAGNOSIS — I213 ST elevation (STEMI) myocardial infarction of unspecified site: Secondary | ICD-10-CM | POA: Diagnosis not present

## 2015-06-30 DIAGNOSIS — Z951 Presence of aortocoronary bypass graft: Secondary | ICD-10-CM | POA: Diagnosis not present

## 2015-06-30 NOTE — Progress Notes (Signed)
Daily Session Note  Patient Details  Name: [name] MRN: [id number] Date of Birth: 26-Oct-1950 Referring Provider:  [name], *  Encounter Date: 06/30/2015  Check In:     Session Check In - 06/30/15 0908    Check-In   Staff Present Heath Lark, RN, BSN, CCRP;Kloey Cazarez, BS, ACSM EP-C, Exercise Physiologist   ER physicians immediately available to respond to emergencies See telemetry face sheet for immediately available ER MD   Medication changes reported     No   Fall or balance concerns reported    No   Warm-up and Cool-down Performed on first and last piece of equipment   VAD Patient? No   Pain Assessment   Currently in Pain? No/denies         Goals Met:  Proper associated with RPD/PD & O2 Sat Exercise tolerated well No report of cardiac concerns or symptoms Strength training completed today  Goals Unmet:  Not Applicable  Goals Comments:    Dr. Emily Filbert is Medical Director for [place] and [facility].

## 2015-07-02 ENCOUNTER — Encounter: Payer: 59 | Admitting: *Deleted

## 2015-07-02 DIAGNOSIS — Z951 Presence of aortocoronary bypass graft: Secondary | ICD-10-CM | POA: Diagnosis not present

## 2015-07-02 DIAGNOSIS — I213 ST elevation (STEMI) myocardial infarction of unspecified site: Secondary | ICD-10-CM | POA: Diagnosis not present

## 2015-07-02 NOTE — Progress Notes (Signed)
Daily Session Note  Patient Details  Name: [name] MRN: [id number] Date of Birth: Dec 12, 1950 Referring Provider:  Birdie Sons, MD  Encounter Date: 07/02/2015  Check In:     Session Check In - 07/02/15 1000    Check-In   Staff Present Heath Lark, RN, BSN, CCRP;Stacey Blanch Media, RRT, RCP, Respiratory Therapist;Dylin Ihnen Amedeo Plenty, BS, ACSM CEP, Exercise Physiologist   ER physicians immediately available to respond to emergencies See telemetry face sheet for immediately available ER MD   Medication changes reported     No   Fall or balance concerns reported    No   Warm-up and Cool-down Performed on first and last piece of equipment   VAD Patient? No   Pain Assessment   Currently in Pain? No/denies   Multiple Pain Sites No         Goals Met:  Independence with exercise equipment Exercise tolerated well No report of cardiac concerns or symptoms Strength training completed today  Goals Unmet:  Not Applicable  Goals Comments: Patient completed exercise prescription and all exercise goals during rehab session. The exercise was tolerated well and the patient is progressing in the program.     Dr. Emily Filbert is Medical Director for [facility] and [facility].

## 2015-07-05 ENCOUNTER — Ambulatory Visit (INDEPENDENT_AMBULATORY_CARE_PROVIDER_SITE_OTHER): Payer: 59 | Admitting: Cardiovascular Disease

## 2015-07-05 ENCOUNTER — Encounter: Payer: Self-pay | Admitting: Cardiovascular Disease

## 2015-07-05 ENCOUNTER — Encounter: Payer: 59 | Admitting: *Deleted

## 2015-07-05 ENCOUNTER — Ambulatory Visit (INDEPENDENT_AMBULATORY_CARE_PROVIDER_SITE_OTHER): Payer: 59

## 2015-07-05 VITALS — BP 150/80 | HR 66 | Ht 66.0 in | Wt 200.5 lb

## 2015-07-05 DIAGNOSIS — I9789 Other postprocedural complications and disorders of the circulatory system, not elsewhere classified: Secondary | ICD-10-CM

## 2015-07-05 DIAGNOSIS — I255 Ischemic cardiomyopathy: Secondary | ICD-10-CM

## 2015-07-05 DIAGNOSIS — I213 ST elevation (STEMI) myocardial infarction of unspecified site: Secondary | ICD-10-CM

## 2015-07-05 DIAGNOSIS — I4891 Unspecified atrial fibrillation: Secondary | ICD-10-CM

## 2015-07-05 DIAGNOSIS — E785 Hyperlipidemia, unspecified: Secondary | ICD-10-CM

## 2015-07-05 DIAGNOSIS — Z951 Presence of aortocoronary bypass graft: Secondary | ICD-10-CM | POA: Diagnosis not present

## 2015-07-05 DIAGNOSIS — Z5181 Encounter for therapeutic drug level monitoring: Secondary | ICD-10-CM | POA: Diagnosis not present

## 2015-07-05 DIAGNOSIS — I10 Essential (primary) hypertension: Secondary | ICD-10-CM

## 2015-07-05 DIAGNOSIS — I25118 Atherosclerotic heart disease of native coronary artery with other forms of angina pectoris: Secondary | ICD-10-CM

## 2015-07-05 MED ORDER — LISINOPRIL 10 MG PO TABS: 10.0000 mg | ORAL_TABLET | Freq: Every day | ORAL | Status: DC

## 2015-07-05 MED ORDER — CLOPIDOGREL BISULFATE 75 MG PO TABS: 75.0000 mg | ORAL_TABLET | Freq: Every day | ORAL | Status: DC

## 2015-07-05 NOTE — Assessment & Plan Note (Signed)
He is doing very well after CABG in September . He is currently attended cardiac rehabilitation. 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 Thus, I want him to stay off work until the end of February. He will require a nuclear stress test before clearing him back for work in order to make sure that his ejection fraction is about 40% with no signs of high-risk ischemia.

## 2015-07-05 NOTE — Assessment & Plan Note (Signed)
Blood pressure is elevated. I increased lisinopril.

## 2015-07-05 NOTE — Assessment & Plan Note (Signed)
It has been more than 2 months since his surgery. Thus, I elected to discontinue amiodarone and warfarin. Given that he had myocardial infarction, I started him on Plavix instead. If he develops recurrent atrial fibrillation, we can consider resuming anticoagulation. However, I think the possibility of that is very low.

## 2015-07-05 NOTE — Progress Notes (Signed)
Daily Session Note  Patient Details  Name: SAJAD GLANDER MRN: [id number] Date of Birth: 05-08-1951 Referring Provider:  [name], *  Encounter Date: 07/05/2015  Check In:     Session Check In - 07/05/15 0844    Check-In   Staff Present Candiss Norse, MS, ACSM CEP, Exercise Physiologist;Susanne Bice, RN, BSN, Laveda Norman, BS, ACSM CEP, Exercise Physiologist   ER physicians immediately available to respond to emergencies See telemetry face sheet for immediately available ER MD   Medication changes reported     No   Fall or balance concerns reported    No   Warm-up and Cool-down Performed on first and last piece of equipment   VAD Patient? No   Pain Assessment   Currently in Pain? No/denies   Multiple Pain Sites No         Goals Met:  Independence with exercise equipment Exercise tolerated well No report of cardiac concerns or symptoms Strength training completed today  Goals Unmet:  Not Applicable  Goals Comments: Patient completed exercise prescription and all exercise goals during rehab session. The exercise was tolerated well and the patient is progressing in the program.     Dr. Emily Filbert is Medical Director for [facility] and [facility].

## 2015-07-05 NOTE — Patient Instructions (Signed)
Medication Instructions:  Please discontinue amiodarone Please discontinue coumadin  Please start Plavix 75 mg once daily Please increase Lipitor to 10 mg once daily   Labwork: Liver, lipid  Testing/Procedures: None  Follow-Up: In February  If you need a refill on your cardiac medications before your next appointment, please call your pharmacy.

## 2015-07-05 NOTE — Assessment & Plan Note (Signed)
Continue high dose atorvastatin. Check fasting lipid and liver profile today.

## 2015-07-05 NOTE — Progress Notes (Signed)
Primary care physician: Dr. Caryn Section.  HPI  This is a 64 year old man who is here today for a follow-up visit regarding coronary artery disease status post CABG September 2016. He presented then with inferior ST elevation myocardial infarction. Cardiac catheterization showed severe three-vessel coronary artery disease. The culprit was an occluded distal RCA with heavy thrombus burden. Ejection fraction was 35-40%. Aspiration thrombectomy and balloon angioplasty was performed with restoration of TIMI-3 flow. However, the results were overall suboptimal and thus the patient underwent urgent CABG by Dr. Roxan Hockey. Postoperative course was remarkable for atrial fibrillation as well as high-grade AV block. The patient was treated with amiodarone and subsequently improved. He has been attending cardiac rehabilitation and feels significantly better. He denies any chest tightness or shortness of breath. He still has some mild pain at the incision. Blood pressure is noted to be elevated today. He has not had any recurrent palpitations.   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  No Known Allergies   Current Outpatient Prescriptions on File Prior to Visit  Medication Sig Dispense Refill  . aspirin 81 MG EC tablet Take 1 tablet (81 mg total) by mouth daily.    Marland Kitchen atorvastatin (LIPITOR) 80 MG tablet Take 1 tablet (80 mg total) by mouth daily at 6 PM. 90 tablet 3  . ENSURE PLUS (ENSURE PLUS) LIQD Take 237 mLs by mouth daily.    . tamsulosin (FLOMAX) 0.4 MG CAPS capsule Take 1 capsule (0.4 mg total) by mouth daily. 30 capsule 0   No current facility-administered medications on file prior to visit.     Past Medical History  Diagnosis Date  . Kidney stones     a. May 2016.  Marland Kitchen Coronary atherosclerosis     a. CT scan 01/02/2015: extensive coronary atherosclerosis;  b. 04/2015 Inf STEMI/Cath/CABG x 4: LIMA->LAD, VG->Diag, VG->OM2, VG->RPL.  Marland Kitchen HTN (hypertension)   . HLD  (hyperlipidemia)   . Postoperative atrial fibrillation ([place])     a. 04/2015 Post-op CABG AF/AFlutter--> converted with rapid atrial pacing and amio; b. CHA2DS2VASc = 3->Coumadin.  . Complete heart block ([place])     a. 04/2015 in setting of inferior STEMI-->resolved.  . Ischemic cardiomyopathy     a. 04/2015 LV gram: EF 35-40%.     Past Surgical History  Procedure Laterality Date  . Neck surgery    . Hernia repair    . Right hip replacement    . Joint replacement    . Cardiac catheterization N/A 05/03/2015    Procedure: Left Heart Cath and Coronary Angiography;  Surgeon: Wellington Hampshire, MD;  Location: [facility];  Service: Cardiovascular;  Laterality: N/A;  . Coronary artery bypass graft N/A 05/03/2015    Procedure: CORONARY ARTERY BYPASS GRAFTING (CABG) times four using left internal mammary artery and right saphenous vein.;  Surgeon: [name], MD;  Location: [facility];  Service: Open Heart Surgery;  Laterality: N/A;  . Tee without cardioversion N/A 05/03/2015    Procedure: TRANSESOPHAGEAL ECHOCARDIOGRAM (TEE);  Surgeon: [name], MD;  Location: [facility];  Service: Open Heart Surgery;  Laterality: N/A;     Family History  Problem Relation Age of Onset  . CAD Father   . CAD Maternal Uncle   . CAD Paternal Uncle      Social History   Social History  . Marital Status: Married    Spouse Name: N/A  . Number of Children: N/A  . Years of  Education: N/A   Occupational History  . Not on file.   Social History Main Topics  . Smoking status: Never Smoker   . Smokeless tobacco: Not on file  . Alcohol Use: No  . Drug Use: No  . Sexual Activity: Yes    Birth Control/ Protection: None   Other Topics Concern  . Not on file   Social History Narrative      PHYSICAL EXAM   BP 150/80 mmHg  Pulse 66  Ht 5\' 6"  (1.676 m)  Wt 200 lb 8 oz (90.946 kg)  BMI 32.38 kg/m2 Constitutional: He is oriented to person, place, and time. He appears well-developed  and well-nourished. No distress.  HENT: No nasal discharge.  Head: Normocephalic and atraumatic.  Eyes: Pupils are equal and round.  No discharge. Neck: Normal range of motion. Neck supple. No JVD present. No thyromegaly present.  Cardiovascular: Normal rate, regular rhythm, normal heart sounds. Exam reveals no gallop and no friction rub. No murmur heard.  Pulmonary/Chest: Effort normal and breath sounds normal. No stridor. No respiratory distress. He has no wheezes. He has no rales. He exhibits no tenderness.  Abdominal: Soft. Bowel sounds are normal. He exhibits no distension. There is no tenderness. There is no rebound and no guarding.  Musculoskeletal: Normal range of motion. He exhibits no edema and no tenderness.  Neurological: He is alert and oriented to person, place, and time. Coordination normal.  Skin: Skin is warm and dry. No rash noted. He is not diaphoretic. No erythema. No pallor.  Psychiatric: He has a normal mood and affect. His behavior is normal. Judgment and thought content normal.       [id number] sinus rhythm with first degree AV block. Old inferior infarct.    ASSESSMENT AND PLAN

## 2015-07-06 ENCOUNTER — Telehealth: Payer: Self-pay

## 2015-07-06 LAB — LIPID PANEL
CHOL/HDL RATIO: 5.9 ratio — AB (ref 0.0–5.0)
Cholesterol, Total: 170 mg/dL (ref 100–199)
HDL: 29 mg/dL — AB (ref >39)
LDL Calculated: 82 mg/dL (ref 0–99)
TRIGLYCERIDES: 293 mg/dL — AB (ref 0–149)
VLDL CHOLESTEROL CAL: 59 mg/dL — AB (ref 5–40)

## 2015-07-06 LAB — HEPATIC FUNCTION PANEL
ALT: 28 IU/L (ref 0–44)
AST: 23 IU/L (ref 0–40)
Albumin: 4.2 g/dL (ref 3.6–4.8)
Alkaline Phosphatase: 104 IU/L (ref 39–117)
BILIRUBIN TOTAL: 0.6 mg/dL (ref 0.0–1.2)
Bilirubin, Direct: 0.22 mg/dL (ref 0.00–0.40)
Total Protein: 6.7 g/dL (ref 6.0–8.5)

## 2015-07-06 NOTE — Telephone Encounter (Signed)
[employer] Short-Term Disability Claim Form faxed to [employer] at [phone]

## 2015-07-08 ENCOUNTER — Telehealth: Payer: Self-pay | Admitting: Cardiovascular Disease

## 2015-07-08 NOTE — Telephone Encounter (Signed)
Patient is working on DOT clearance and does not have sufficient Echo results documented.  Please let Margurite Auerbach at [facility] health know if we should do an updated echo.  Patient needs EF = or > 40% .  Patient will also need cardiac clearance note.      Please call Hilda Blades [phone] to discuss.  All other required notes sent this morning  (cabg cath report and last ov note from [place]).

## 2015-07-09 ENCOUNTER — Encounter: Payer: 59 | Attending: Thoracic Surgery (Cardiothoracic Vascular Surgery) | Admitting: *Deleted

## 2015-07-09 DIAGNOSIS — Z951 Presence of aortocoronary bypass graft: Secondary | ICD-10-CM | POA: Diagnosis not present

## 2015-07-09 DIAGNOSIS — I213 ST elevation (STEMI) myocardial infarction of unspecified site: Secondary | ICD-10-CM | POA: Diagnosis not present

## 2015-07-09 NOTE — Telephone Encounter (Signed)
S/w Margurite Auerbach with Dr. Alben Spittle office who is inquiring of plan to have pt cleared to return to work. Informed Hilda Blades that notes from 11/28 OV w/Dr. Fletcher Anon indicated pt has f/u 07/06/16 w/plans for nuclear stress test to clear pt before returning to work possibly the end of Feb. 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 Dr. Rosanna Randy unsure if he will clear him until he is cleared by Dr. Fletcher Anon.

## 2015-07-09 NOTE — Progress Notes (Signed)
Daily Session Note  Patient Details  Name: Jonavon B Volante MRN: [id number] Date of Birth: 09/03/1950 Referring Provider:  Hendrickson, Steven C, *  Encounter Date: 07/09/2015  Check In:     Session Check In - 07/09/15 0937    Check-In   Staff Present Carroll Enterkin, RN, BSN;Renee MacMillan, MS, ACSM CEP, Exercise Physiologist;Susanne Bice, RN, BSN, CCRP   ER physicians immediately available to respond to emergencies See telemetry face sheet for immediately available ER MD   Medication changes reported     No   Fall or balance concerns reported    No   Warm-up and Cool-down Performed on first and last piece of equipment   VAD Patient? No   Pain Assessment   Currently in Pain? No/denies         Goals Met:  Proper associated with RPD/PD & O2 Sat Exercise tolerated well  Goals Unmet:  Not Applicable  Goals Comments:    Dr. Mark Miller is Medical Director for [facility] and [facility]. 

## 2015-07-12 ENCOUNTER — Encounter: Payer: 59 | Admitting: *Deleted

## 2015-07-12 DIAGNOSIS — Z951 Presence of aortocoronary bypass graft: Secondary | ICD-10-CM | POA: Diagnosis not present

## 2015-07-12 DIAGNOSIS — I213 ST elevation (STEMI) myocardial infarction of unspecified site: Secondary | ICD-10-CM

## 2015-07-12 NOTE — Progress Notes (Signed)
Daily Session Note  Patient Details  Name: [name] MRN: [id number] Date of Birth: 05/28/1951 Referring Provider:  Birdie Sons, MD  Encounter Date: 07/12/2015  Check In:     Session Check In - 07/12/15 0820    Check-In   Staff Present Candiss Norse, MS, ACSM CEP, Exercise Physiologist;Susanne Bice, RN, BSN, Laveda Norman, BS, ACSM CEP, Exercise Physiologist   ER physicians immediately available to respond to emergencies See telemetry face sheet for immediately available ER MD   Medication changes reported     No   Fall or balance concerns reported    No   Warm-up and Cool-down Performed on first and last piece of equipment   VAD Patient? No   Pain Assessment   Currently in Pain? No/denies   Multiple Pain Sites No         Goals Met:  Independence with exercise equipment Exercise tolerated well No report of cardiac concerns or symptoms Strength training completed today  Goals Unmet:  Not Applicable  Goals Comments: Patient completed exercise prescription and all exercise goals during rehab session. The exercise was tolerated well and the patient is progressing in the program.     Dr. Emily Filbert is Medical Director for [facility] and [facility].

## 2015-07-12 NOTE — Progress Notes (Signed)
Cardiac Individual Treatment Plan  Patient Details  Name: [name] MRN: [id number] Date of Birth: [birth year] Referring Provider:  [name], *  Initial Encounter Date:    Visit Diagnosis: S/P CABG x 4  ST elevation myocardial infarction (STEMI), unspecified artery ([place])  Patient's Home Medications on Admission:  Current outpatient prescriptions:  .  aspirin 81 MG EC tablet, Take 1 tablet (81 mg total) by mouth daily., Disp: , Rfl:  .  atorvastatin (LIPITOR) 80 MG tablet, Take 1 tablet (80 mg total) by mouth daily at 6 PM., Disp: 90 tablet, Rfl: 3 .  clopidogrel (PLAVIX) 75 MG tablet, Take 1 tablet (75 mg total) by mouth daily., Disp: 90 tablet, Rfl: 3 .  ENSURE PLUS (ENSURE PLUS) LIQD, Take 237 mLs by mouth daily., Disp: , Rfl:  .  lisinopril (PRINIVIL,ZESTRIL) 10 MG tablet, Take 1 tablet (10 mg total) by mouth daily., Disp: 90 tablet, Rfl: 3 .  tamsulosin (FLOMAX) 0.4 MG CAPS capsule, Take 1 capsule (0.4 mg total) by mouth daily., Disp: 30 capsule, Rfl: 0  Past Medical History: Past Medical History  Diagnosis Date  . Kidney stones     a. May 2016.  Marland Kitchen Coronary atherosclerosis     a. CT scan 01/02/2015: extensive coronary atherosclerosis;  b. 04/2015 Inf STEMI/Cath/CABG x 4: LIMA->LAD, VG->Diag, VG->OM2, VG->RPL.  Marland Kitchen HTN (hypertension)   . HLD (hyperlipidemia)   . Postoperative atrial fibrillation ([place])     a. 04/2015 Post-op CABG AF/AFlutter--> converted with rapid atrial pacing and amio; b. CHA2DS2VASc = 3->Coumadin.  . Complete heart block ([place])     a. 04/2015 in setting of inferior STEMI-->resolved.  . Ischemic cardiomyopathy     a. 04/2015 LV gram: EF 35-40%.    Tobacco Use: History  Smoking status  . Never Smoker   Smokeless tobacco  . Not on file    Labs: Recent Review Flowsheet Data    Labs for ITP Cardiac and Pulmonary Rehab Latest Ref Rng 05/03/2015 05/04/2015 05/04/2015 05/04/2015 07/05/2015   Cholestrol 100 - 199 mg/dL - - - - 170   LDLCALC 0 -  99 mg/dL - - - - 82   HDL >39 mg/dL - - - - 29(L)   Trlycerides 0 - 149 mg/dL - - - - 293(H)   PHART 7.350 - 7.450 7.309(L) 7.404 7.429 - -   PCO2ART 35.0 - 45.0 mmHg 44.0 39.7 42.0 - -   HCO3 20.0 - 24.0 mEq/L 22.3 24.9(H) 27.9(H) - -   TCO2 0 - 100 mmol/L '24 26 29 24 ' -   ACIDBASEDEF 0.0 - 2.0 mmol/L 4.0(H) - - - -   O2SAT - 89.0 97.0 96.0 - -       Exercise Target Goals:    Exercise Program Goal: Individual exercise prescription set with THRR, safety & activity barriers. Participant demonstrates ability to understand and report RPE using BORG scale, to self-measure pulse accurately, and to acknowledge the importance of the exercise prescription.  Exercise Prescription Goal: Starting with aerobic activity 30 plus minutes a day, 3 days per week for initial exercise prescription. Provide home exercise prescription and guidelines that participant acknowledges understanding prior to discharge.  Activity Barriers & Risk Stratification:     Activity Barriers & Risk Stratification - 06/01/15 1004    Activity Barriers & Risk Stratification   Activity Barriers Right Hip Replacement      6 Minute Walk:     6 Minute Walk      06/01/15 1053  6 Minute Walk   Phase Initial     Distance 1170 feet     Walk Time 6 minutes     Resting HR 65 bpm     Resting BP 112/62 mmHg     Max Ex. HR 101 bpm     Max Ex. BP 114/54 mmHg     RPE 12     Symptoms No        Initial Exercise Prescription:     Initial Exercise Prescription - 06/01/15 1100    Date of Initial Exercise Prescription   Date 06/01/15   Treadmill   MPH 2   Grade 0   Minutes 10   Bike   Level 0.2   Minutes 15   Recumbant Bike   Level 3   RPM 40   Watts 25   Minutes 15   NuStep   Level 3   Watts 30   Minutes 15   Arm Ergometer   Level 1   Watts 10   Minutes 10   Arm/Foot Ergometer   Level 4   Watts 15   Minutes 10   Cybex   Level 3   RPM 50   Minutes 10   Recumbant Elliptical   Level 1    RPM 40   Watts 10   Minutes 15   Elliptical   Level 1   Speed 3   Minutes 1   REL-XR   Level 3   Watts 40   Minutes 15   Prescription Details   Frequency (times per week) 3   Duration Progress to 30 minutes of continuous aerobic without signs/symptoms of physical distress   Intensity   THRR REST +  30   Ratings of Perceived Exertion 11-15   Progression Continue progressive overload as per policy without signs/symptoms or physical distress.   Resistance Training   Training Prescription Yes   Weight 2   Reps 10-15      Exercise Prescription Changes:     Exercise Prescription Changes      06/08/15 0700 06/16/15 0800 06/23/15 0800 07/05/15 1500     Exercise Review   Progression No  Has not started program yet; med review was on 06/01/15 Yes Yes Yes    Response to Exercise   Blood Pressure (Admit)    132/70 mmHg    Blood Pressure (Exercise)    168/82 mmHg    Blood Pressure (Exit)    132/68 mmHg    Heart Rate (Admit)    72 bpm    Heart Rate (Exercise)    93 bpm    Heart Rate (Exit)    77 bpm    Rating of Perceived Exertion (Exercise)    14    Symptoms   None None    Comments   Reviewed individualized exercise prescription and made increases per departmental policy. Exercise increases were discussed with the patient and they were able to perform the new work loads without issue (no signs or symptoms).  Briefed patient on interval training and encouarged him to routinely add this training into his sessions during rehab. Explained how HIIT relates to progression and the importance of that throughout the program.    Duration   Progress to 50 minutes of aerobic without signs/symptoms of physical distress Progress to 50 minutes of aerobic without signs/symptoms of physical distress    Intensity   Rest + 30 Rest + 30    Progression   Continue progressive overload as per policy  without signs/symptoms or physical distress. Continue progressive overload as per policy without  signs/symptoms or physical distress.    Resistance Training   Training Prescription   Yes Yes    Weight   4 4    Reps   10-15 10-15    Interval Training   Interval Training   No Yes    Equipment    Treadmill    Comments    2.7/0% 3 min, followed by 3.0/1% for 1 minute. Repeat for 15 min.    Treadmill   MPH  2.2 2.6 2.6    Grade  0 1 1    Minutes  '15 15 15    ' Recumbant Bike   Level  '7 7 7    ' RPM  50 50 50    Minutes  '20 20 20       ' Discharge Exercise Prescription (Final Exercise Prescription Changes):     Exercise Prescription Changes - 07/05/15 1500    Exercise Review   Progression Yes   Response to Exercise   Blood Pressure (Admit) 132/70 mmHg   Blood Pressure (Exercise) 168/82 mmHg   Blood Pressure (Exit) 132/68 mmHg   Heart Rate (Admit) 72 bpm   Heart Rate (Exercise) 93 bpm   Heart Rate (Exit) 77 bpm   Rating of Perceived Exertion (Exercise) 14   Symptoms None   Comments Briefed patient on interval training and encouarged him to routinely add this training into his sessions during rehab. Explained how HIIT relates to progression and the importance of that throughout the program.   Duration Progress to 50 minutes of aerobic without signs/symptoms of physical distress   Intensity Rest + 30   Progression Continue progressive overload as per policy without signs/symptoms or physical distress.   Resistance Training   Training Prescription Yes   Weight 4   Reps 10-15   Interval Training   Interval Training Yes   Equipment Treadmill   Comments 2.7/0% 3 min, followed by 3.0/1% for 1 minute. Repeat for 15 min.   Treadmill   MPH 2.6   Grade 1   Minutes 15   Recumbant Bike   Level 7   RPM 50   Minutes 20      Nutrition:  Target Goals: Understanding of nutrition guidelines, daily intake of sodium <1563m, cholesterol <2079m calories 30% from fat and 7% or less from saturated fats, daily to have 5 or more servings of fruits and vegetables.  Biometrics:     Pre  Biometrics - 06/01/15 1053    Pre Biometrics   Height 5' 7.25" (1.708 m)   Weight 200 lb 8 oz (90.946 kg)   Waist Circumference 42 inches   Hip Circumference 40.5 inches   Waist to Hip Ratio 1.04 %   BMI (Calculated) 31.2       Nutrition Therapy Plan and Nutrition Goals:     Nutrition Therapy & Goals - 06/21/15 1459    Nutrition Therapy   Diet   Instructed on a meal plan based on 1700 calories; DASH diet principles, Patient's wife was present for instruction.   Drug/Food Interactions Coumadin/Vit K;Statins/Certain Fruits   Fiber 30 grams   Whole Grain Foods 3 servings   Protein 7 ounces/day   Saturated Fats 11 max. grams   Fruits and Vegetables 5 servings/day   Personal Nutrition Goals   Personal Goal #1 Read labels for saturated fat, trans fat and sodium.   Personal Goal #2 Rinse canned vegetables or try "no salt added"  canned vegetables, fresh or frozen vegetables.   Personal Goal #3 To increase fruit to 1-2 servings per day.      Nutrition Discharge: Rate Your Plate Scores:     Rate Your Plate - 79/15/05 6979    Rate Your Plate Scores   Pre Score 58   Pre Score % 64 %      Nutrition Goals Re-Evaluation:   Psychosocial: Target Goals: Acknowledge presence or absence of depression, maximize coping skills, provide positive support system. Participant is able to verbalize types and ability to use techniques and skills needed for reducing stress and depression.  Initial Review & Psychosocial Screening:     Initial Psych Review & Screening - 06/01/15 1007    Initial Review   Current issues with Current Sleep Concerns  Sleeping concerns when first discharged. Has improved since healing process  has made him able to sleep on his side.    Family Dynamics   Good Support System? --  Good support system at home. Wife,daughter and son.   Barriers   Psychosocial barriers to participate in program There are no identifiable barriers or psychosocial needs.;The patient  should benefit from training in stress management and relaxation.   Screening Interventions   Interventions Encouraged to exercise;Program counselor consult      Quality of Life Scores:     Quality of Life - 06/01/15 1536    Quality of Life Scores   Health/Function Pre 23.83 %   Socioeconomic Pre 30 %   Psych/Spiritual Pre 30 %   Family Pre 30 %   GLOBAL Pre 27.36 %      PHQ-9:     Recent Review Flowsheet Data    Depression screen [facility] 2/9 06/01/2015   Decreased Interest 0   Down, Depressed, Hopeless 0   PHQ - 2 Score 0   Altered sleeping 3   Tired, decreased energy 1   Change in appetite 0   Feeling bad or failure about yourself  0   Trouble concentrating 0   Moving slowly or fidgety/restless 0   Suicidal thoughts 0   PHQ-9 Score 4   Difficult doing work/chores Not difficult at all      Psychosocial Evaluation and Intervention:     Psychosocial Evaluation - 06/14/15 1015    Psychosocial Evaluation & Interventions   Interventions Stress management education;Relaxation education;Encouraged to exercise with the program and follow exercise prescription   Comments Counselor met with Mr. Berthold today for initial psychosocial evaluation.  He is a 64 year old who had quadruple bypass surgery in September.  He has a strong support system with a spouse of 15 years and (2) adult children close by.  Mr. Maker is generally healthy other than his cardiac issues, and formerly had a hip replacement surgery and some kidney stones, but has recovered well and quickly from these.  He states he is sleeping well and has a good appetite.   Mr. Marchena reports no history of depression or anxiety currently or in the past.  He states he is generally in a positive mood.  Mr. 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  He has some concern about when and how much to work in the future.  His goals for this  program are to build up his stamina and strength and to eat healthier.   He has an appointment to meet with the dietician next  week.  Mr. Nicotra has (3) grandchildren and he walks the youngest in the stroller almost daily.  He plans to continue walking and incorporate biking in his exercise regimen following this program.     Continued Psychosocial Services Needed --  Mr. Viona Gilmore will benefit from consistent exercise and the psychoeducational components of this program.        Psychosocial Re-Evaluation:     Psychosocial Re-Evaluation      06/23/15 1110           Psychosocial Re-Evaluation   Interventions Encouraged to attend Cardiac Rehabilitation for the exercise       Comments Casten talked about how when he gets stressed he has a shorter temper. Ways to help with stress where taught in the Stress Cardiac REhab education class today. 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           Vocational Rehabilitation: Provide vocational rehab assistance to qualifying candidates.   Vocational Rehab Evaluation & Intervention:     Vocational Rehab - 06/01/15 1004    Initial Vocational Rehab Evaluation & Intervention   Assessment shows need for Vocational Rehabilitation No      Education: Education Goals: Education classes will be provided on a weekly basis, covering required topics. Participant will state understanding/return demonstration of topics presented.  Learning Barriers/Preferences:     Learning Barriers/Preferences - 06/01/15 1004    Learning Barriers/Preferences   Learning Barriers None   Learning Preferences Group Instruction      Education Topics: General Nutrition Guidelines/Fats and Fiber: -Group instruction provided by verbal, written material, models and posters to present the general guidelines for heart healthy nutrition. Gives an explanation and review of dietary fats  and fiber.          Cardiac Rehab from 07/12/2015 in [facility] Cardiac Rehab   Date  07/12/15   Educator  CR   Instruction Review Code  2- meets goals/outcomes      Controlling Sodium/Reading Food Labels: -Group verbal and written material supporting the discussion of sodium use in heart healthy nutrition. Review and explanation with models, verbal and written materials for utilization of the food label.   Exercise Physiology & Risk Factors: - Group verbal and written instruction with models to review the exercise physiology of the cardiovascular system and associated critical values. Details cardiovascular disease risk factors and the goals associated with each risk factor.   Aerobic Exercise & Resistance Training: - Gives group verbal and written discussion on the health impact of inactivity. On the components of aerobic and resistive training programs and the benefits of this training and how to safely progress through these programs.      Cardiac Rehab from 07/12/2015 in [facility] Cardiac Rehab   Date  06/14/15   Educator  RM   Instruction Review Code  2- meets goals/outcomes      Flexibility, Balance, General Exercise Guidelines: - Provides group verbal and written instruction on the benefits of flexibility and balance training programs. Provides general exercise guidelines with specific guidelines to those with heart or lung disease. Demonstration and skill practice provided.      Cardiac Rehab from 07/12/2015 in [facility] Cardiac Rehab   Date  06/21/15   Educator  RM   Instruction Review Code  2- meets goals/outcomes      Stress Management: - Provides group verbal and written instruction about the health risks of  elevated stress, cause of high stress, and healthy ways to reduce stress.      Cardiac Rehab from 07/12/2015 in [facility] Cardiac Rehab   Date  06/23/15   Educator  [facility]   Instruction Review Code  2- meets goals/outcomes      Depression: - Provides group verbal and written  instruction on the correlation between heart/lung disease and depressed mood, treatment options, and the stigmas associated with seeking treatment.   Anatomy & Physiology of the Heart: - Group verbal and written instruction and models provide basic cardiac anatomy and physiology, with the coronary electrical and arterial systems. Review of: AMI, Angina, Valve disease, Heart Failure, Cardiac Arrhythmia, Pacemakers, and the ICD.      Cardiac Rehab from 07/12/2015 in [facility] Cardiac Rehab   Date  06/28/15   Educator  SB   Instruction Review Code  2- meets goals/outcomes      Cardiac Procedures: - Group verbal and written instruction and models to describe the testing methods done to diagnose heart disease. Reviews the outcomes of the test results. Describes the treatment choices: Medical Management, Angioplasty, or Coronary Bypass Surgery.   Cardiac Medications: - Group verbal and written instruction to review commonly prescribed medications for heart disease. Reviews the medication, class of the drug, and side effects. Includes the steps to properly store meds and maintain the prescription regimen.   Go Sex-Intimacy & Heart Disease, Get SMART - Goal Setting: - Group verbal and written instruction through game format to discuss heart disease and the return to sexual intimacy. Provides group verbal and written material to discuss and apply goal setting through the application of the S.M.A.R.T. Method.   Other Matters of the Heart: - Provides group verbal, written materials and models to describe Heart Failure, Angina, Valve Disease, and Diabetes in the realm of heart disease. Includes description of the disease process and treatment options available to the cardiac patient.   Exercise & Equipment Safety: - Individual verbal instruction and demonstration of equipment use and safety with use of the equipment.      Cardiac Rehab from 07/12/2015 in [facility] Cardiac Rehab   Date  06/01/15   Educator   Sb   Instruction Review Code  2- meets goals/outcomes      Infection Prevention: - Provides verbal and written material to individual with discussion of infection control including proper hand washing and proper equipment cleaning during exercise session.      Cardiac Rehab from 07/12/2015 in [facility] Cardiac Rehab   Date  06/01/15   Educator  SB   Instruction Review Code  2- meets goals/outcomes      Falls Prevention: - Provides verbal and written material to individual with discussion of falls prevention and safety.      Cardiac Rehab from 07/12/2015 in [facility] Cardiac Rehab   Date  06/01/15   Educator  SB   Instruction Review Code  2- meets goals/outcomes      Diabetes: - Individual verbal and written instruction to review signs/symptoms of diabetes, desired ranges of glucose level fasting, after meals and with exercise. Advice that pre and post exercise glucose checks will be done for 3 sessions at entry of program.    Knowledge Questionnaire Score:     Knowledge Questionnaire Score - 06/01/15 1536    Knowledge Questionnaire Score   Pre Score 21/28      Personal Goals and Risk Factors at Admission:     Personal Goals and Risk Factors at Admission - 06/01/15 1008  Personal Goals and Risk Factors on Admission    Weight Management Yes   Intervention Learn and follow the exercise and diet guidelines while in the program. Utilize the nutrition and education classes to help gain knowledge of the diet and exercise expectations in the program   Admit Weight 192 lb (87.091 kg)   Goal Weight 185 lb (83.915 kg)   Increase Aerobic Exercise and Physical Activity Yes;Sedentary  HAd physically active job, no routine exercise program.   Take Less Medication Yes   Intervention Learn your risk factors and begin the lifestyle modifications for risk factor control during your time in the program.   Understand more about Heart/Pulmonary Disease. Yes   Intervention While in program utilize  professionals for any questions, and attend the education sessions. Great websites to use are www.americanheart.org or www.lung.org for reliable information.   Diabetes No   Hypertension Yes   Goal Participant will see blood pressure controlled within the values of 140/27m/Hg or within value directed by their physician.   Intervention Provide nutrition & aerobic exercise along with prescribed medications to achieve BP 140/90 or less.   Lipids Yes   Goal Cholesterol controlled with medications as prescribed, with individualized exercise RX and with personalized nutrition plan. Value goals: LDL < 779m HDL > 4018mParticipant states understanding of desired cholesterol values and following prescriptions.   Intervention Provide nutrition & aerobic exercise along with prescribed medications to achieve LDL <63m58mDL >40mg9mStress No      Personal Goals and Risk Factors Review:      Goals and Risk Factor Review      06/23/15 1108           Increase Aerobic Exercise and Physical Activity   Goals Progress/Improvement seen  Yes       Comments KeithJarrellnds Cardiac Rehab regularly plus he said today that he is able to push his grandson in a stroller and usually walks a mile every day (including today when he attended Cardiac REhab already).        Hypertension   Goal --  Fayez's blood presure is very stable at 120/70 today and other days similiar.        Abnormal Lipids   Goal --  KeithBabatundefollow up fasting blood work on Jul 05, 2015,          Personal Goals Discharge (Final Personal Goals and Risk Factors Review):      Goals and Risk Factor Review - 06/23/15 1108    Increase Aerobic Exercise and Physical Activity   Goals Progress/Improvement seen  Yes   Comments KeithNikolaosnds Cardiac Rehab regularly plus he said today that he is able to push his grandson in a stroller and usually walks a mile every day (including today when he attended Cardiac REhab already).    Hypertension    Goal --  Benen's blood presure is very stable at 120/70 today and other days similiar.    Abnormal Lipids   Goal --  KeithDaltenfollow up fasting blood work on Jul 05, 2015,      ITP Comments:     ITP Comments      07/12/15 1616           ITP Comments 30 day review preparation          Comments: 30 day review preparation continue with ITP

## 2015-07-14 DIAGNOSIS — Z951 Presence of aortocoronary bypass graft: Secondary | ICD-10-CM | POA: Diagnosis not present

## 2015-07-14 DIAGNOSIS — I213 ST elevation (STEMI) myocardial infarction of unspecified site: Secondary | ICD-10-CM

## 2015-07-14 NOTE — Progress Notes (Signed)
Daily Session Note  Patient Details  Name: [name] MRN: [id number] Date of Birth: 1951/06/20 Referring Provider:  [name], *  Encounter Date: 07/14/2015  Check In:     Session Check In - 07/14/15 0847    Check-In   Staff Present Heath Lark, RN, BSN, CCRP;Renee Dillard Essex, MS, ACSM CEP, Exercise Physiologist;Washington Whedbee, BS, ACSM EP-C, Exercise Physiologist   ER physicians immediately available to respond to emergencies See telemetry face sheet for immediately available ER MD   Medication changes reported     No   Fall or balance concerns reported    No   Warm-up and Cool-down Performed on first and last piece of equipment   VAD Patient? No   Pain Assessment   Currently in Pain? No/denies         Goals Met:  Proper associated with RPD/PD & O2 Sat Exercise tolerated well No report of cardiac concerns or symptoms Strength training completed today  Goals Unmet:  Not Applicable  Goals Comments:   Dr. Emily Filbert is Medical Director for [place] and [facility].

## 2015-07-14 NOTE — Addendum Note (Signed)
Addended by: Lynford Humphrey on: 07/14/2015 10:54 AM   Modules accepted: Orders

## 2015-07-16 ENCOUNTER — Encounter: Payer: 59 | Admitting: *Deleted

## 2015-07-16 DIAGNOSIS — I213 ST elevation (STEMI) myocardial infarction of unspecified site: Secondary | ICD-10-CM | POA: Diagnosis not present

## 2015-07-16 DIAGNOSIS — Z951 Presence of aortocoronary bypass graft: Secondary | ICD-10-CM

## 2015-07-16 NOTE — Progress Notes (Signed)
Daily Session Note  Patient Details  Name: [name] MRN: [id number] Date of Birth: Nov 15, 1950 Referring Provider:  [name], *  Encounter Date: 07/16/2015  Check In:     Session Check In - 07/16/15 0954    Check-In   Staff Present Nyoka Cowden, RN;Shuna Tabor, RN, BSN, CCRP;Renee Dillard Essex, MS, ACSM CEP, Exercise Physiologist   ER physicians immediately available to respond to emergencies See telemetry face sheet for immediately available ER MD   Medication changes reported     No   Fall or balance concerns reported    No   Warm-up and Cool-down Performed on first and last piece of equipment   VAD Patient? No   Pain Assessment   Currently in Pain? No/denies         Goals Met:  Independence with exercise equipment Exercise tolerated well Personal goals reviewed No report of cardiac concerns or symptoms Strength training completed today  Goals Unmet:  Not Applicable  Goals Comments: doing well with exercise progression and working on goals   Dr. Emily Filbert is Medical Director for [place] and [facility].

## 2015-07-19 ENCOUNTER — Encounter: Payer: 59 | Admitting: *Deleted

## 2015-07-19 DIAGNOSIS — I213 ST elevation (STEMI) myocardial infarction of unspecified site: Secondary | ICD-10-CM

## 2015-07-19 DIAGNOSIS — Z951 Presence of aortocoronary bypass graft: Secondary | ICD-10-CM

## 2015-07-19 NOTE — Progress Notes (Signed)
Daily Session Note  Patient Details  Name: NIL XIONG MRN: [id number] Date of Birth: 11-Oct-1950 Referring Provider:  [name], *  Encounter Date: 07/19/2015  Check In:     Session Check In - 07/19/15 0828    Check-In   Staff Present Candiss Norse, MS, ACSM CEP, Exercise Physiologist;Susanne Bice, RN, BSN, Laveda Norman, BS, ACSM CEP, Exercise Physiologist   ER physicians immediately available to respond to emergencies See telemetry face sheet for immediately available ER MD   Medication changes reported     No   Fall or balance concerns reported    No   Warm-up and Cool-down Performed on first and last piece of equipment   VAD Patient? No   Pain Assessment   Currently in Pain? No/denies   Multiple Pain Sites No         Goals Met:  Independence with exercise equipment Exercise tolerated well No report of cardiac concerns or symptoms Strength training completed today  Goals Unmet:  Not Applicable  Goals Comments: Patient completed exercise prescription and all exercise goals during rehab session. The exercise was tolerated well and the patient is progressing in the program.     Dr. Emily Filbert is Medical Director for [place] and [facility].

## 2015-07-21 DIAGNOSIS — I213 ST elevation (STEMI) myocardial infarction of unspecified site: Secondary | ICD-10-CM

## 2015-07-21 DIAGNOSIS — Z951 Presence of aortocoronary bypass graft: Secondary | ICD-10-CM | POA: Diagnosis not present

## 2015-07-21 NOTE — Progress Notes (Signed)
Daily Session Note  Patient Details  Name: [name] MRN: [id number] Date of Birth: Dec 26, 1950 Referring Provider:  Birdie Sons, MD  Encounter Date: 07/21/2015  Check In:     Session Check In - 07/21/15 0829    Check-In   Staff Present Heath Lark, RN, BSN, CCRP;Renee Dillard Essex, MS, ACSM CEP, Exercise Physiologist;Irem Stoneham, BS, ACSM EP-C, Exercise Physiologist   ER physicians immediately available to respond to emergencies See telemetry face sheet for immediately available ER MD   Medication changes reported     No   Fall or balance concerns reported    No   Warm-up and Cool-down Performed on first and last piece of equipment   VAD Patient? No   Pain Assessment   Currently in Pain? No/denies         Goals Met:  Proper associated with RPD/PD & O2 Sat Exercise tolerated well No report of cardiac concerns or symptoms Strength training completed today  Goals Unmet:  Not Applicable  Goals Comments:   Dr. Emily Filbert is Medical Director for [place] and [facility].

## 2015-07-23 ENCOUNTER — Encounter: Payer: 59 | Admitting: *Deleted

## 2015-07-23 DIAGNOSIS — I213 ST elevation (STEMI) myocardial infarction of unspecified site: Secondary | ICD-10-CM

## 2015-07-23 DIAGNOSIS — Z951 Presence of aortocoronary bypass graft: Secondary | ICD-10-CM

## 2015-07-23 NOTE — Progress Notes (Signed)
Daily Session Note  Patient Details  Name: [name] MRN: [id number] Date of Birth: March 31, 1951 Referring Provider:  [name], *  Encounter Date: 07/23/2015  Check In:     Session Check In - 07/23/15 1364    Check-In   Staff Present Nyoka Cowden, RN;Susanne Bice, RN, BSN, CCRP;Renee Dillard Essex, MS, ACSM CEP, Exercise Physiologist   ER physicians immediately available to respond to emergencies See telemetry face sheet for immediately available ER MD   Medication changes reported     No   Fall or balance concerns reported    No   Warm-up and Cool-down Performed on first and last piece of equipment   VAD Patient? No   Pain Assessment   Currently in Pain? No/denies         Goals Met:  Independence with exercise equipment Exercise tolerated well No report of cardiac concerns or symptoms Strength training completed today  Goals Unmet:  Not Applicable  Goals Comments: Doing well with exercise prescription progression.    Dr. Emily Filbert is Medical Director for [place] and [facility].

## 2015-07-26 ENCOUNTER — Encounter: Payer: 59 | Admitting: *Deleted

## 2015-07-26 DIAGNOSIS — Z951 Presence of aortocoronary bypass graft: Secondary | ICD-10-CM

## 2015-07-26 DIAGNOSIS — I213 ST elevation (STEMI) myocardial infarction of unspecified site: Secondary | ICD-10-CM

## 2015-07-26 NOTE — Progress Notes (Signed)
Daily Session Note  Patient Details  Name: [name] MRN: [id number] Date of Birth: April 16, 1951 Referring Provider:  Birdie Sons, MD  Encounter Date: 07/26/2015  Check In:     Session Check In - 07/26/15 0836    Check-In   Staff Present Candiss Norse, MS, ACSM CEP, Exercise Physiologist;Susanne Bice, RN, BSN, Laveda Norman, BS, ACSM CEP, Exercise Physiologist   ER physicians immediately available to respond to emergencies See telemetry face sheet for immediately available ER MD   Medication changes reported     No   Fall or balance concerns reported    No   Warm-up and Cool-down Performed on first and last piece of equipment   VAD Patient? No   Pain Assessment   Currently in Pain? No/denies   Multiple Pain Sites No         Goals Met:  Independence with exercise equipment Exercise tolerated well No report of cardiac concerns or symptoms Strength training completed today  Goals Unmet:  Not Applicable  Goals Comments: Patient completed exercise prescription and all exercise goals during rehab session. The exercise was tolerated well and the patient is progressing in the program.     Dr. Emily Filbert is Medical Director for [place] and [facility].

## 2015-07-28 DIAGNOSIS — I213 ST elevation (STEMI) myocardial infarction of unspecified site: Secondary | ICD-10-CM | POA: Diagnosis not present

## 2015-07-28 DIAGNOSIS — Z951 Presence of aortocoronary bypass graft: Secondary | ICD-10-CM

## 2015-07-28 NOTE — Progress Notes (Signed)
Daily Session Note  Patient Details  Name: [name] MRN: [id number] Date of Birth: 18-Jan-1951 Referring Provider:  Birdie Sons, MD  Encounter Date: 07/28/2015  Check In:     Session Check In - 07/28/15 0827    Check-In   Staff Present Heath Lark, RN, BSN, CCRP;Renee Dillard Essex, MS, ACSM CEP, Exercise Physiologist;Tywaun Hiltner, BS, ACSM EP-C, Exercise Physiologist   ER physicians immediately available to respond to emergencies See telemetry face sheet for immediately available ER MD   Medication changes reported     No   Fall or balance concerns reported    No   Warm-up and Cool-down Performed on first and last piece of equipment   VAD Patient? No   Pain Assessment   Currently in Pain? No/denies         Goals Met:  Proper associated with RPD/PD & O2 Sat Exercise tolerated well No report of cardiac concerns or symptoms Strength training completed today  Goals Unmet:  Not Applicable  Goals Comments:    Dr. Emily Filbert is Medical Director for [place] and [facility].

## 2015-07-30 ENCOUNTER — Encounter: Payer: 59 | Admitting: *Deleted

## 2015-07-30 DIAGNOSIS — Z951 Presence of aortocoronary bypass graft: Secondary | ICD-10-CM | POA: Diagnosis not present

## 2015-07-30 DIAGNOSIS — I213 ST elevation (STEMI) myocardial infarction of unspecified site: Secondary | ICD-10-CM | POA: Diagnosis not present

## 2015-07-30 NOTE — Progress Notes (Signed)
Daily Session Note  Patient Details  Name: [name] MRN: [id number] Date of Birth: 04/18/1951 Referring Provider:  Birdie Sons, MD  Encounter Date: 07/30/2015  Check In:     Session Check In - 07/30/15 0940    Check-In   Staff Present Heath Lark, RN, BSN, CCRP;Carroll Enterkin, RN, BSN;Stacey Blanch Media, RRT, RCP, Respiratory Therapist   ER physicians immediately available to respond to emergencies See telemetry face sheet for immediately available ER MD   Medication changes reported     No   Fall or balance concerns reported    No   Warm-up and Cool-down Performed on first and last piece of equipment   VAD Patient? No   Pain Assessment   Currently in Pain? No/denies         Goals Met:  Independence with exercise equipment Exercise tolerated well No report of cardiac concerns or symptoms Strength training completed today  Goals Unmet:  Not Applicable  Goals Comments: Doing well with exercise prescription progression.    Dr. Emily Filbert is Medical Director for [place] and [facility].

## 2015-08-04 DIAGNOSIS — Z951 Presence of aortocoronary bypass graft: Secondary | ICD-10-CM

## 2015-08-04 DIAGNOSIS — I213 ST elevation (STEMI) myocardial infarction of unspecified site: Secondary | ICD-10-CM | POA: Diagnosis not present

## 2015-08-04 NOTE — Progress Notes (Signed)
Daily Session Note  Patient Details  Name: [name] MRN: [id number] Date of Birth: Jun 08, 1951 Referring Provider:  Birdie Sons, MD  Encounter Date: 08/04/2015  Check In:     Session Check In - 08/04/15 0816    Check-In   Staff Present Heath Lark, RN, BSN, CCRP;Renee Dillard Essex, MS, ACSM CEP, Exercise Physiologist;Shoshanna Mcquitty, BS, ACSM EP-C, Exercise Physiologist   ER physicians immediately available to respond to emergencies See telemetry face sheet for immediately available ER MD   Medication changes reported     No   Fall or balance concerns reported    No   Warm-up and Cool-down Performed on first and last piece of equipment   VAD Patient? No   Pain Assessment   Currently in Pain? No/denies         Goals Met:  Proper associated with RPD/PD & O2 Sat Exercise tolerated well No report of cardiac concerns or symptoms Strength training completed today  Goals Unmet:  Not Applicable  Goals Comments:    Dr. Emily Filbert is Medical Director for [place] [facility].

## 2015-08-06 ENCOUNTER — Encounter: Payer: 59 | Admitting: *Deleted

## 2015-08-06 VITALS — Ht 67.25 in | Wt 201.1 lb

## 2015-08-06 DIAGNOSIS — Z951 Presence of aortocoronary bypass graft: Secondary | ICD-10-CM | POA: Diagnosis not present

## 2015-08-06 DIAGNOSIS — I213 ST elevation (STEMI) myocardial infarction of unspecified site: Secondary | ICD-10-CM | POA: Diagnosis not present

## 2015-08-06 NOTE — Progress Notes (Signed)
Daily Session Note  Patient Details  Name: DARIC KOREN MRN: [id number] Date of Birth: [birth year] Referring Provider:  [name], *  Encounter Date: 08/06/2015  Check In:     Session Check In - 08/06/15 1117    Check-In   Staff Present Nyoka Cowden, RN;Shannin Naab Dillard Essex, MS, ACSM CEP, Exercise Physiologist;Susanne Bice, RN, BSN, [facility]   ER physicians immediately available to respond to emergencies See telemetry face sheet for immediately available ER MD   Medication changes reported     No   Fall or balance concerns reported    No   Warm-up and Cool-down Performed on first and last piece of equipment   VAD Patient? No   Pain Assessment   Currently in Pain? No/denies   Multiple Pain Sites No           Exercise Prescription Changes - 08/06/15 1100    Exercise Review   Progression Yes   Response to Exercise   Symptoms None   Comments Completed post 6MW test and improved by over 30%. Patient is approaching graduation of the program and home exercise plans were discussed. Details of the patient's exercise prescription and what they need to do in order to continue the prescription and progress with exercise were outlined and the patient verbalized understanding. The patient plans to complete all exercise at home. He is turning an extra room he has into a workout room and plans to use this with his wife. He also walks a mile on 5 days a week. He will also be going back to work full time which involve 3, 12 hour shifts.     Duration Progress to 50 minutes of aerobic without signs/symptoms of physical distress   Intensity Rest + 30   Progression Continue progressive overload as per policy without signs/symptoms or physical distress.   Resistance Training   Training Prescription Yes   Weight 4   Reps 10-15   Interval Training   Interval Training Yes   Equipment Treadmill;NuStep   Comments 2.7/0% 3 min, followed by 3.0/3% for 1 minute. Repeat for 15 min; T5 nustep  intervals on L5 from 35-50 watts.   Treadmill   MPH 3.5   Grade 3   Minutes 20   Recumbant Bike   Level 7   RPM 50   Minutes 20   T5 Nustep   Level 5   Watts 35   Minutes 15   Home Exercise Plan   Plans to continue exercise at Home  Making an exercise room at home.       Goals Met:  Independence with exercise equipment Exercise tolerated well Personal goals reviewed No report of cardiac concerns or symptoms Strength training completed today  Goals Unmet:  Not Applicable  Goals Comments: Patient completed exercise prescription and all exercise goals during rehab session. The exercise was tolerated well and the patient is progressing in the program.    Dr. Emily Filbert is Medical Director for [place] and [facility].

## 2015-08-08 NOTE — Progress Notes (Signed)
Cardiac Individual Treatment Plan  Patient Details  Name: [name] MRN: [id number] Date of Birth: 04/01/1951 Referring Provider:  [name], *  Initial Encounter Date:    Visit Diagnosis: S/P CABG x 4  Patient's Home Medications on Admission:  Current outpatient prescriptions:  .  aspirin 81 MG EC tablet, Take 1 tablet (81 mg total) by mouth daily., Disp: , Rfl:  .  atorvastatin (LIPITOR) 80 MG tablet, Take 1 tablet (80 mg total) by mouth daily at 6 PM., Disp: 90 tablet, Rfl: 3 .  clopidogrel (PLAVIX) 75 MG tablet, Take 1 tablet (75 mg total) by mouth daily., Disp: 90 tablet, Rfl: 3 .  ENSURE PLUS (ENSURE PLUS) LIQD, Take 237 mLs by mouth daily., Disp: , Rfl:  .  lisinopril (PRINIVIL,ZESTRIL) 10 MG tablet, Take 1 tablet (10 mg total) by mouth daily., Disp: 90 tablet, Rfl: 3 .  tamsulosin (FLOMAX) 0.4 MG CAPS capsule, Take 1 capsule (0.4 mg total) by mouth daily., Disp: 30 capsule, Rfl: 0  Past Medical History: Past Medical History  Diagnosis Date  . Kidney stones     a. May 2016.  Marland Kitchen Coronary atherosclerosis     a. CT scan 01/02/2015: extensive coronary atherosclerosis;  b. 04/2015 Inf STEMI/Cath/CABG x 4: LIMA->LAD, VG->Diag, VG->OM2, VG->RPL.  Marland Kitchen HTN (hypertension)   . HLD (hyperlipidemia)   . Postoperative atrial fibrillation ([place])     a. 04/2015 Post-op CABG AF/AFlutter--> converted with rapid atrial pacing and amio; b. CHA2DS2VASc = 3->Coumadin.  . Complete heart block ([place])     a. 04/2015 in setting of inferior STEMI-->resolved.  . Ischemic cardiomyopathy     a. 04/2015 LV gram: EF 35-40%.    Tobacco Use: History  Smoking status  . Never Smoker   Smokeless tobacco  . Not on file    Labs: Recent Review Flowsheet Data    Labs for ITP Cardiac and Pulmonary Rehab Latest Ref Rng 05/03/2015 05/04/2015 05/04/2015 05/04/2015 07/05/2015   Cholestrol 100 - 199 mg/dL - - - - 170   LDLCALC 0 - 99 mg/dL - - - - 82   HDL >39 mg/dL - - - - 29(L)   Trlycerides 0 -  149 mg/dL - - - - 293(H)   PHART 7.350 - 7.450 7.309(L) 7.404 7.429 - -   PCO2ART 35.0 - 45.0 mmHg 44.0 39.7 42.0 - -   HCO3 20.0 - 24.0 mEq/L 22.3 24.9(H) 27.9(H) - -   TCO2 0 - 100 mmol/L _0 -   ACIDBASEDEF 0.0 - 2.0 mmol/L 4.0(H) - - - -   O2SAT - 89.0 97.0 96.0 - -       Exercise Target Goals:    Exercise Program Goal: Individual exercise prescription set with THRR, safety & activity barriers. Participant demonstrates ability to understand and report RPE using BORG scale, to self-measure pulse accurately, and to acknowledge the importance of the exercise prescription.  Exercise Prescription Goal: Starting with aerobic activity 30 plus minutes a day, 3 days per week for initial exercise prescription. Provide home exercise prescription and guidelines that participant acknowledges understanding prior to discharge.  Activity Barriers & Risk Stratification:     Activity Barriers & Risk Stratification - 06/01/15 1004    Activity Barriers & Risk Stratification   Activity Barriers Right Hip Replacement      6 Minute Walk:     6 Minute Walk      06/01/15 1053 08/06/15 0848     6 Minute Walk  Phase Initial Discharge    Distance 1170 feet 1550 feet    Distance % Change  32 %    Walk Time 6 minutes 6 minutes    Resting HR 65 bpm 78 bpm    Resting BP 112/62 mmHg 126/70 mmHg    Max Ex. HR 101 bpm 87 bpm    Max Ex. BP 114/54 mmHg 140/68 mmHg    RPE 12 13    Symptoms No No       Initial Exercise Prescription:     Initial Exercise Prescription - 06/01/15 1100    Date of Initial Exercise Prescription   Date 06/01/15   Treadmill   MPH 2   Grade 0   Minutes 10   Bike   Level 0.2   Minutes 15   Recumbant Bike   Level 3   RPM 40   Watts 25   Minutes 15   NuStep   Level 3   Watts 30   Minutes 15   Arm Ergometer   Level 1   Watts 10   Minutes 10   Arm/Foot Ergometer   Level 4   Watts 15   Minutes 10   Cybex   Level 3   RPM 50   Minutes 10    Recumbant Elliptical   Level 1   RPM 40   Watts 10   Minutes 15   Elliptical   Level 1   Speed 3   Minutes 1   REL-XR   Level 3   Watts 40   Minutes 15   Prescription Details   Frequency (times per week) 3   Duration Progress to 30 minutes of continuous aerobic without signs/symptoms of physical distress   Intensity   THRR REST +  30   Ratings of Perceived Exertion 11-15   Progression Continue progressive overload as per policy without signs/symptoms or physical distress.   Resistance Training   Training Prescription Yes   Weight 2   Reps 10-15      Exercise Prescription Changes:     Exercise Prescription Changes      06/08/15 0700 06/16/15 0800 06/23/15 0800 07/05/15 1500 07/14/15 0900   Exercise Review   Progression No  Has not started program yet; med review was on 06/01/15 Yes Yes Yes Yes   Response to Exercise   Blood Pressure (Admit)    132/70 mmHg    Blood Pressure (Exercise)    168/82 mmHg    Blood Pressure (Exit)    132/68 mmHg    Heart Rate (Admit)    72 bpm    Heart Rate (Exercise)    93 bpm    Heart Rate (Exit)    77 bpm    Rating of Perceived Exertion (Exercise)    14    Symptoms   None None None   Comments   Reviewed individualized exercise prescription and made increases per departmental policy. Exercise increases were discussed with the patient and they were able to perform the new work loads without issue (no signs or symptoms).  Briefed patient on interval training and encouarged him to routinely add this training into his sessions during rehab. Explained how HIIT relates to progression and the importance of that throughout the program.    Duration   Progress to 50 minutes of aerobic without signs/symptoms of physical distress Progress to 50 minutes of aerobic without signs/symptoms of physical distress Progress to 50 minutes of aerobic without signs/symptoms of physical distress   Intensity  Rest + 30 Rest + 30 Rest + 30   Progression   Continue  progressive overload as per policy without signs/symptoms or physical distress. Continue progressive overload as per policy without signs/symptoms or physical distress. Continue progressive overload as per policy without signs/symptoms or physical distress.   Resistance Training   Training Prescription   Yes Yes Yes   Weight   _0 Reps   10-15 10-15 10-15   Interval Training   Interval Training   No Yes Yes   Equipment    Treadmill Treadmill   Comments    2.7/0% 3 min, followed by 3.0/1% for 1 minute. Repeat for 15 min. 2.7/0% 3 min, followed by 3.0/1% for 1 minute. Repeat for 15 min.   Treadmill   MPH  2.2 2.6 2.6 3.5   Grade  0 _1 Minutes  _2 Recumbant Bike   Level  _3 RPM  50 50 50 50   Minutes  _4 T5 Nustep   Level     4   Watts     30     07/21/15 0800 07/30/15 4097 08/06/15 1100       Exercise Review   Progression Yes Yes Yes     Response to Exercise   Blood Pressure (Admit)  132/76 mmHg      Blood Pressure (Exercise)  178/68 mmHg      Blood Pressure (Exit)  118/70 mmHg      Heart Rate (Admit)  80 bpm      Heart Rate (Exercise)  106 bpm      Heart Rate (Exit)  81 bpm      Rating of Perceived Exertion (Exercise)  14      Symptoms None None None     Comments Discussed with patient about adding two days of home exercise to current program.  Voiced understanding, and will make an attempt to do more walking at home. Hayze has maxed out his walking speed, in order to continue to progress him an incline of 2-3% has been added to his intervals. He is also exercising at home on 2d/wk, He walks at home. He has also maxed out on his continuous exercise time and the intervals serve to provide a challenge amongst that time.  Completed post 6MW test and improved by over 30%. Patient is approaching graduation of the program and home exercise plans were discussed. Details of the patient's exercise prescription and what they need to do in order to continue  the prescription and progress with exercise were outlined and the patient verbalized understanding. The patient plans to complete all exercise at home. He is turning an extra room he has into a workout room and plans to use this with his wife. He also walks a mile on 5 days a week. He will also be going back to work full time which involve 3, 12 hour shifts.       Frequency Add 2 additional days to program exercise sessions.       Duration Progress to 50 minutes of aerobic without signs/symptoms of physical distress Progress to 50 minutes of aerobic without signs/symptoms of physical distress Progress to 50 minutes of aerobic without signs/symptoms of physical distress     Intensity Rest + 30 Rest + 30 Rest + 30     Progression Continue progressive overload as per policy without signs/symptoms or  physical distress. Continue progressive overload as per policy without signs/symptoms or physical distress. Continue progressive overload as per policy without signs/symptoms or physical distress.     Resistance Training   Training Prescription Yes Yes Yes     Weight _0 Reps 10-15 10-15 10-15     Interval Training   Interval Training Yes Yes Yes     Equipment Treadmill;NuStep Treadmill;NuStep Treadmill;NuStep     Comments 2.7/0% 3 min, followed by 3.0/1% for 1 minute. Repeat for 15 min; T5 nustep intervals on L5 from 35-50 watts. 2.7/0% 3 min, followed by 3.0/3% for 1 minute. Repeat for 15 min; T5 nustep intervals on L5 from 35-50 watts. 2.7/0% 3 min, followed by 3.0/3% for 1 minute. Repeat for 15 min; T5 nustep intervals on L5 from 35-50 watts.     Treadmill   MPH 3.5 3.5 3.5     Grade _1 Minutes _2 Recumbant Bike   Level _3 RPM 50 50 50     Minutes _4 T5 Nustep   Level _5 Watts 35 35 35     Minutes  15 15     Home Exercise Plan   Plans to continue exercise at   Home  Making an exercise room at home.         Discharge Exercise Prescription  (Final Exercise Prescription Changes):     Exercise Prescription Changes - 08/06/15 1100    Exercise Review   Progression Yes   Response to Exercise   Symptoms None   Comments Completed post 6MW test and improved by over 30%. Patient is approaching graduation of the program and home exercise plans were discussed. Details of the patient's exercise prescription and what they need to do in order to continue the prescription and progress with exercise were outlined and the patient verbalized understanding. The patient plans to complete all exercise at home. He is turning an extra room he has into a workout room and plans to use this with his wife. He also walks a mile on 5 days a week. He will also be going back to work full time which involve 3, 12 hour shifts.     Duration Progress to 50 minutes of aerobic without signs/symptoms of physical distress   Intensity Rest + 30   Progression Continue progressive overload as per policy without signs/symptoms or physical distress.   Resistance Training   Training Prescription Yes   Weight 4   Reps 10-15   Interval Training   Interval Training Yes   Equipment Treadmill;NuStep   Comments 2.7/0% 3 min, followed by 3.0/3% for 1 minute. Repeat for 15 min; T5 nustep intervals on L5 from 35-50 watts.   Treadmill   MPH 3.5   Grade 3   Minutes 20   Recumbant Bike   Level 7   RPM 50   Minutes 20   T5 Nustep   Level 5   Watts 35   Minutes 15   Home Exercise Plan   Plans to continue exercise at Home  Making an exercise room at home.       Nutrition:  Target Goals: Understanding of nutrition guidelines, daily intake of sodium <1546m, cholesterol <2029m calories 30% from fat and 7% or less from saturated fats, daily to have 5 or more servings of fruits  and vegetables.  Biometrics:     Pre Biometrics - 06/01/15 1053    Pre Biometrics   Height 5' 7.25" (1.708 m)   Weight 200 lb 8 oz (90.946 kg)   Waist Circumference 42 inches   Hip  Circumference 40.5 inches   Waist to Hip Ratio 1.04 %   BMI (Calculated) 31.2         Post Biometrics - 08/06/15 0845     Post  Biometrics   Height 5' 7.25" (1.708 m)   Weight 201 lb 1.6 oz (91.218 kg)   Waist Circumference 41.5 inches   Hip Circumference 40.75 inches   Waist to Hip Ratio 1.02 %   BMI (Calculated) 31.3      Nutrition Therapy Plan and Nutrition Goals:     Nutrition Therapy & Goals - 06/21/15 1459    Nutrition Therapy   Diet   Instructed on a meal plan based on 1700 calories; DASH diet principles, Patient's wife was present for instruction.   Drug/Food Interactions Coumadin/Vit K;Statins/Certain Fruits   Fiber 30 grams   Whole Grain Foods 3 servings   Protein 7 ounces/day   Saturated Fats 11 max. grams   Fruits and Vegetables 5 servings/day   Personal Nutrition Goals   Personal Goal #1 Read labels for saturated fat, trans fat and sodium.   Personal Goal #2 Rinse canned vegetables or try "no salt added" canned vegetables, fresh or frozen vegetables.   Personal Goal #3 To increase fruit to 1-2 servings per day.      Nutrition Discharge: Rate Your Plate Scores:     Rate Your Plate - 26/33/35 4562    Rate Your Plate Scores   Pre Score 58   Pre Score % 64 %   Post Score 57   Post Score % 64 %   % Change 0 %      Nutrition Goals Re-Evaluation:   Psychosocial: Target Goals: Acknowledge presence or absence of depression, maximize coping skills, provide positive support system. Participant is able to verbalize types and ability to use techniques and skills needed for reducing stress and depression.  Initial Review & Psychosocial Screening:     Initial Psych Review & Screening - 06/01/15 1007    Initial Review   Current issues with Current Sleep Concerns  Sleeping concerns when first discharged. Has improved since healing process  has made him able to sleep on his side.    Family Dynamics   Good Support System? --  Good support system at home.  Wife,daughter and son.   Barriers   Psychosocial barriers to participate in program There are no identifiable barriers or psychosocial needs.;The patient should benefit from training in stress management and relaxation.   Screening Interventions   Interventions Encouraged to exercise;Program counselor consult      Quality of Life Scores:     Quality of Life - 07/26/15 1827    Quality of Life Scores   Health/Function Pre 23.83 %   Health/Function Post 20.97 %   Health/Function % Change -12 %   Socioeconomic Pre 30 %   Socioeconomic Post 23.07 %   Socioeconomic % Change -23 %   Psych/Spiritual Pre 30 %   Psych/Spiritual Post 23.64 %   Psych/Spiritual % Change -21 %   Family Pre 30 %   Family Post 22.7 %   Family % Change -24 %   GLOBAL Pre 27.36 %   GLOBAL Post 22.21 %   GLOBAL % Change -18 %  PHQ-9:     Recent Review Flowsheet Data    Depression screen [facility] 2/9 07/26/2015 06/01/2015   Decreased Interest 0 0   Down, Depressed, Hopeless 0 0   PHQ - 2 Score 0 0   Altered sleeping 0 3   Tired, decreased energy - 1   Change in appetite 0 0   Feeling bad or failure about yourself  0 0   Trouble concentrating 0 0   Moving slowly or fidgety/restless 0 0   Suicidal thoughts 0 0   PHQ-9 Score 0 4   Difficult doing work/chores Not difficult at all Not difficult at all      Psychosocial Evaluation and Intervention:     Psychosocial Evaluation - 06/14/15 1015    Psychosocial Evaluation & Interventions   Interventions Stress management education;Relaxation education;Encouraged to exercise with the program and follow exercise prescription   Comments Counselor met with Mr. Baranek today for initial psychosocial evaluation.  He is a 65 year old who had quadruple bypass surgery in September.  He has a strong support system with a spouse of 34 years and (2) adult children close by.  Mr. Brame is generally healthy other than his cardiac issues, and formerly had a hip  replacement surgery and some kidney stones, but has recovered well and quickly from these.  He states he is sleeping well and has a good appetite.   Mr. Strike reports no history of depression or anxiety currently or in the past.  He states he is generally in a positive mood.  Mr. 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  He has some concern about when and how much to work in the future.  His goals for this program are to build up his stamina and strength and to eat healthier.   He has an appointment to meet with the dietician next week.  Mr. Rosendahl has (3) grandchildren and he walks the youngest in the stroller almost daily.  He plans to continue walking and incorporate biking in his exercise regimen following this program.     Continued Psychosocial Services Needed --  Mr. Viona Gilmore will benefit from consistent exercise and the psychoeducational components of this program.        Psychosocial Re-Evaluation:     Psychosocial Re-Evaluation      06/23/15 1110           Psychosocial Re-Evaluation   Interventions Encouraged to attend Cardiac Rehabilitation for the exercise       Comments Reg talked about how when he gets stressed he has a shorter temper. Ways to help with stress where taught in the Stress Cardiac REhab education class today. 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           Vocational Rehabilitation: Provide vocational rehab assistance to qualifying candidates.   Vocational Rehab Evaluation & Intervention:     Vocational Rehab - 06/01/15 1004    Initial Vocational Rehab Evaluation & Intervention   Assessment shows need for Vocational Rehabilitation No      Education: Education Goals: Education classes will be provided on a weekly basis, covering required topics. Participant will state  understanding/return demonstration of topics presented.  Learning Barriers/Preferences:     Learning Barriers/Preferences - 06/01/15 1004    Learning Barriers/Preferences  Learning Barriers None   Learning Preferences Group Instruction      Education Topics: General Nutrition Guidelines/Fats and Fiber: -Group instruction provided by verbal, written material, models and posters to present the general guidelines for heart healthy nutrition. Gives an explanation and review of dietary fats and fiber.          Cardiac Rehab from 08/04/2015 in [facility] Cardiac and Pulmonary Rehab   Date  07/12/15   Educator  CR   Instruction Review Code  2- meets goals/outcomes      Controlling Sodium/Reading Food Labels: -Group verbal and written material supporting the discussion of sodium use in heart healthy nutrition. Review and explanation with models, verbal and written materials for utilization of the food label.      Cardiac Rehab from 08/04/2015 in [facility] Cardiac and Pulmonary Rehab   Date  07/19/15   Educator  CR   Instruction Review Code  2- meets goals/outcomes      Exercise Physiology & Risk Factors: - Group verbal and written instruction with models to review the exercise physiology of the cardiovascular system and associated critical values. Details cardiovascular disease risk factors and the goals associated with each risk factor.   Aerobic Exercise & Resistance Training: - Gives group verbal and written discussion on the health impact of inactivity. On the components of aerobic and resistive training programs and the benefits of this training and how to safely progress through these programs.      Cardiac Rehab from 08/04/2015 in [facility] Cardiac and Pulmonary Rehab   Date  06/14/15   Educator  RM   Instruction Review Code  2- meets goals/outcomes      Flexibility, Balance, General Exercise Guidelines: - Provides group verbal and written instruction on the benefits of flexibility and  balance training programs. Provides general exercise guidelines with specific guidelines to those with heart or lung disease. Demonstration and skill practice provided.      Cardiac Rehab from 08/04/2015 in [facility] Cardiac and Pulmonary Rehab   Date  06/21/15   Educator  RM   Instruction Review Code  2- meets goals/outcomes      Stress Management: - Provides group verbal and written instruction about the health risks of elevated stress, cause of high stress, and healthy ways to reduce stress.      Cardiac Rehab from 08/04/2015 in [facility] Cardiac and Pulmonary Rehab   Date  06/23/15   Educator  [facility]   Instruction Review Code  2- meets goals/outcomes      Depression: - Provides group verbal and written instruction on the correlation between heart/lung disease and depressed mood, treatment options, and the stigmas associated with seeking treatment.      Cardiac Rehab from 08/04/2015 in [facility] Cardiac and Pulmonary Rehab   Date  07/21/15   Educator  [facility]   Instruction Review Code  2- meets goals/outcomes      Anatomy & Physiology of the Heart: - Group verbal and written instruction and models provide basic cardiac anatomy and physiology, with the coronary electrical and arterial systems. Review of: AMI, Angina, Valve disease, Heart Failure, Cardiac Arrhythmia, Pacemakers, and the ICD.      Cardiac Rehab from 08/04/2015 in [facility] Cardiac and Pulmonary Rehab   Date  06/28/15   Educator  SB   Instruction Review Code  2- meets goals/outcomes      Cardiac Procedures: - Group verbal and written instruction and models to describe the testing methods done to diagnose heart disease. Reviews the outcomes  of the test results. Describes the treatment choices: Medical Management, Angioplasty, or Coronary Bypass Surgery.      Cardiac Rehab from 08/04/2015 in [facility] Cardiac and Pulmonary Rehab   Date  07/26/15   Educator  SB   Instruction Review Code  2- meets goals/outcomes      Cardiac Medications: -  Group verbal and written instruction to review commonly prescribed medications for heart disease. Reviews the medication, class of the drug, and side effects. Includes the steps to properly store meds and maintain the prescription regimen.      Cardiac Rehab from 08/04/2015 in [facility] Cardiac and Pulmonary Rehab   Date  08/04/15   Educator  SB   Instruction Review Code  2- meets goals/outcomes      Go Sex-Intimacy & Heart Disease, Get SMART - Goal Setting: - Group verbal and written instruction through game format to discuss heart disease and the return to sexual intimacy. Provides group verbal and written material to discuss and apply goal setting through the application of the S.M.A.R.T. Method.      Cardiac Rehab from 08/04/2015 in [facility] Cardiac and Pulmonary Rehab   Date  07/26/15   Educator  SB   Instruction Review Code  2- meets goals/outcomes      Other Matters of the Heart: - Provides group verbal, written materials and models to describe Heart Failure, Angina, Valve Disease, and Diabetes in the realm of heart disease. Includes description of the disease process and treatment options available to the cardiac patient.   Exercise & Equipment Safety: - Individual verbal instruction and demonstration of equipment use and safety with use of the equipment.      Cardiac Rehab from 08/04/2015 in [facility] Cardiac and Pulmonary Rehab   Date  06/01/15   Educator  Sb   Instruction Review Code  2- meets goals/outcomes      Infection Prevention: - Provides verbal and written material to individual with discussion of infection control including proper hand washing and proper equipment cleaning during exercise session.      Cardiac Rehab from 08/04/2015 in [facility] Cardiac and Pulmonary Rehab   Date  06/01/15   Educator  SB   Instruction Review Code  2- meets goals/outcomes      Falls Prevention: - Provides verbal and written material to individual with discussion of falls prevention and  safety.      Cardiac Rehab from 08/04/2015 in [facility] Cardiac and Pulmonary Rehab   Date  06/01/15   Educator  SB   Instruction Review Code  2- meets goals/outcomes      Diabetes: - Individual verbal and written instruction to review signs/symptoms of diabetes, desired ranges of glucose level fasting, after meals and with exercise. Advice that pre and post exercise glucose checks will be done for 3 sessions at entry of program.    Knowledge Questionnaire Score:     Knowledge Questionnaire Score - 07/26/15 1826    Knowledge Questionnaire Score   Post Score 22/28      Personal Goals and Risk Factors at Admission:     Personal Goals and Risk Factors at Admission - 06/01/15 1008    Personal Goals and Risk Factors on Admission    Weight Management Yes   Intervention Learn and follow the exercise and diet guidelines while in the program. Utilize the nutrition and education classes to help gain knowledge of the diet and exercise expectations in the program   Admit Weight 192 lb (87.091 kg)   Goal Weight  185 lb (83.915 kg)   Increase Aerobic Exercise and Physical Activity Yes;Sedentary  H[employer] physically active job, no routine exercise program.   Take Less Medication Yes   Intervention Learn your risk factors and begin the lifestyle modifications for risk factor control during your time in the program.   Understand more about Heart/Pulmonary Disease. Yes   Intervention While in program utilize professionals for any questions, and attend the education sessions. Great websites to use are www.americanheart.org or www.lung.org for reliable information.   Diabetes No   Hypertension Yes   Goal Participant will see blood pressure controlled within the values of 140/50m/Hg or within value directed by their physician.   Intervention Provide nutrition & aerobic exercise along with prescribed medications to achieve BP 140/90 or less.   Lipids Yes   Goal Cholesterol controlled with medications as  prescribed, with individualized exercise RX and with personalized nutrition plan. Value goals: LDL < 788m HDL > 4023mParticipant states understanding of desired cholesterol values and following prescriptions.   Intervention Provide nutrition & aerobic exercise along with prescribed medications to achieve LDL <51m61mDL >40mg55mStress No      Personal Goals and Risk Factors Review:      Goals and Risk Factor Review      06/23/15 1108 07/16/15 0954         Increase Aerobic Exercise and Physical Activity   Goals Progress/Improvement seen  Yes Yes      Comments [facility] plus he said today that he is able to push his grandson in a stroller and usually walks a mile every day (including today when he attended Cardiac REhab already).  States he is feeling good about his exercise progression.      Hypertension   Goal --  Jahson's blood presure is very stable at 120/70 today and other days similiar.        Progress seen toward goals  Yes      Comments  Vitlas in good range      Abnormal Lipids   Goal --  KeithEbrahimfollow up fasting blood work on Jul 05, 2015,       Progress seen towards goals  Unknown      Comments  No labs reported.  KeithJarrisstate that his Triglycerides are elevated.  He was not given any plan to help contol this concern.  [facility]/eat to help with his triglyceride level.  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 He will try to make  plan while here in the program ,so he will be prepared to make good food choices at home and on the road.         Personal Goals Discharge (Final Personal Goals and Risk Factors Review):      Goals and Risk Factor Review - 07/16/15 0954    Increase Aerobic Exercise and Physical Activity   Goals Progress/Improvement seen  Yes   Comments States he is feeling good about his exercise progression.   Hypertension   Progress  seen toward goals Yes   Comments Vitlas in good range   Abnormal Lipids   Progress seen towards goals Unknown   Comments No labs reported.  KeithBetzalelstate that his Triglycerides are elevated.  He was not given any plan to help contol this concern.  Will provide to KeithJudahst of  foods to avoid/eat to help with his triglyceride level.  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 He will try to make  plan while here in the program ,so he will be prepared to make good food choices at home and on the road.      ITP Comments:     ITP Comments      07/12/15 1616 08/08/15 1243         ITP Comments 30 day review preparation Ready for 30 day review.  Continue with ITP   Preparing for discharge         Comments:

## 2015-08-11 ENCOUNTER — Encounter: Payer: 59 | Attending: Thoracic Surgery (Cardiothoracic Vascular Surgery)

## 2015-08-11 DIAGNOSIS — I213 ST elevation (STEMI) myocardial infarction of unspecified site: Secondary | ICD-10-CM | POA: Insufficient documentation

## 2015-08-11 DIAGNOSIS — Z951 Presence of aortocoronary bypass graft: Secondary | ICD-10-CM | POA: Diagnosis not present

## 2015-08-11 NOTE — Patient Instructions (Addendum)
Discharge Instructions  Patient Details  Name: [name] MRN: [id number] Date of Birth: Aug 18, 1950 Referring Provider:  [name], *   Number of Visits:36  Reason for Discharge:  Patient reached a stable level of exercise. Patient independent in their exercise.  Smoking History:  History  Smoking status  . Never Smoker   Smokeless tobacco  . Not on file    Diagnosis:  S/P CABG x 4  ST elevation myocardial infarction (STEMI), unspecified artery [facility])  Initial Exercise Prescription:     Initial Exercise Prescription - 06/01/15 1100    Date of Initial Exercise Prescription   Date 06/01/15   Treadmill   MPH 2   Grade 0   Minutes 10   Bike   Level 0.2   Minutes 15   Recumbant Bike   Level 3   RPM 40   Watts 25   Minutes 15   NuStep   Level 3   Watts 30   Minutes 15   Arm Ergometer   Level 1   Watts 10   Minutes 10   Arm/Foot Ergometer   Level 4   Watts 15   Minutes 10   Cybex   Level 3   RPM 50   Minutes 10   Recumbant Elliptical   Level 1   RPM 40   Watts 10   Minutes 15   Elliptical   Level 1   Speed 3   Minutes 1   REL-XR   Level 3   Watts 40   Minutes 15   Prescription Details   Frequency (times per week) 3   Duration Progress to 30 minutes of continuous aerobic without signs/symptoms of physical distress   Intensity   THRR REST +  30   Ratings of Perceived Exertion 11-15   Progression Continue progressive overload as per policy without signs/symptoms or physical distress.   Resistance Training   Training Prescription Yes   Weight 2   Reps 10-15      Discharge Exercise Prescription (Final Exercise Prescription Changes):     Exercise Prescription Changes - 08/06/15 1100    Exercise Review   Progression Yes   Response to Exercise   Symptoms None   Comments Completed post 6MW test and improved by over 30%. Patient is approaching graduation of the program and home exercise plans were discussed. Details of the  patient's exercise prescription and what they need to do in order to continue the prescription and progress with exercise were outlined and the patient verbalized understanding. The patient plans to complete all exercise at home. He is turning an extra room he has into a workout room and plans to use this with his wife. He also walks a mile on 5 days a week. He will also be going back to work full time which involve 3, 12 hour shifts.     Duration Progress to 50 minutes of aerobic without signs/symptoms of physical distress   Intensity Rest + 30   Progression Continue progressive overload as per policy without signs/symptoms or physical distress.   Resistance Training   Training Prescription Yes   Weight 4   Reps 10-15   Interval Training   Interval Training Yes   Equipment Treadmill;NuStep   Comments 2.7/0% 3 min, followed by 3.0/3% for 1 minute. Repeat for 15 min; T5 nustep intervals on L5 from 35-50 watts.   Treadmill   MPH 3.5   Grade 3   Minutes 20   Recumbant  Bike   Level 7   RPM 50   Minutes 20   T5 Nustep   Level 5   Watts 35   Minutes 15   Home Exercise Plan   Plans to continue exercise at Home  Making an exercise room at home.       Functional Capacity:     6 Minute Walk      06/01/15 1053 08/06/15 0848     6 Minute Walk   Phase Initial Discharge    Distance 1170 feet 1550 feet    Distance % Change  32 %    Walk Time 6 minutes 6 minutes    Resting HR 65 bpm 78 bpm    Resting BP 112/62 mmHg 126/70 mmHg    Max Ex. HR 101 bpm 87 bpm    Max Ex. BP 114/54 mmHg 140/68 mmHg    RPE 12 13    Symptoms No No       Quality of Life:     Quality of Life - 07/26/15 1827    Quality of Life Scores   Health/Function Pre 23.83 %   Health/Function Post 20.97 %   Health/Function % Change -12 %   Socioeconomic Pre 30 %   Socioeconomic Post 23.07 %   Socioeconomic % Change -23 %   Psych/Spiritual Pre 30 %   Psych/Spiritual Post 23.64 %   Psych/Spiritual % Change -21  %   Family Pre 30 %   Family Post 22.7 %   Family % Change -24 %   GLOBAL Pre 27.36 %   GLOBAL Post 22.21 %   GLOBAL % Change -18 %      Personal Goals: Goals established at orientation with interventions provided to work toward goal.     Personal Goals and Risk Factors at Admission - 06/01/15 1008    Personal Goals and Risk Factors on Admission    Weight Management Yes   Intervention Learn and follow the exercise and diet guidelines while in the program. Utilize the nutrition and education classes to help gain knowledge of the diet and exercise expectations in the program   Admit Weight 192 lb (87.091 kg)   Goal Weight 185 lb (83.915 kg)   Increase Aerobic Exercise and Physical Activity Yes;Sedentary  HAd physically active job, no routine exercise program.   Take Less Medication Yes   Intervention Learn your risk factors and begin the lifestyle modifications for risk factor control during your time in the program.   Understand more about Heart/Pulmonary Disease. Yes   Intervention While in program utilize professionals for any questions, and attend the education sessions. Great websites to use are www.americanheart.org or www.lung.org for reliable information.   Diabetes No   Hypertension Yes   Goal Participant will see blood pressure controlled within the values of 140/41mm/Hg or within value directed by their physician.   Intervention Provide nutrition & aerobic exercise along with prescribed medications to achieve BP 140/90 or less.   Lipids Yes   Goal Cholesterol controlled with medications as prescribed, with individualized exercise RX and with personalized nutrition plan. Value goals: LDL < 70mg , HDL > 40mg . Participant states understanding of desired cholesterol values and following prescriptions.   Intervention Provide nutrition & aerobic exercise along with prescribed medications to achieve LDL 70mg , HDL >40mg .   Stress No       Personal Goals Discharge:     Goals  and Risk Factor Review - 07/16/15 0954    Increase Aerobic Exercise and  Physical Activity   Goals Progress/Improvement seen  Yes   Comments States he is feeling good about his exercise progression.   Hypertension   Progress seen toward goals Yes   Comments Vitlas in good range   Abnormal Lipids   Progress seen towards goals Unknown   Comments No labs reported.  Herny did state that his Triglycerides are elevated.  He was not given any plan to help contol this concern.  Will provide to [facility] a list of foods to avoid/eat to help with his triglyceride level.  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 He will try to make  plan while here in the program ,so he will be prepared to make good food choices at home and on the road.      Nutrition & Weight - Outcomes:     Pre Biometrics - 06/01/15 1053    Pre Biometrics   Height 5' 7.25" (1.708 m)   Weight 200 lb 8 oz (90.946 kg)   Waist Circumference 42 inches   Hip Circumference 40.5 inches   Waist to Hip Ratio 1.04 %   BMI (Calculated) 31.2         Post Biometrics - 08/06/15 0845     Post  Biometrics   Height 5' 7.25" (1.708 m)   Weight 201 lb 1.6 oz (91.218 kg)   Waist Circumference 41.5 inches   Hip Circumference 40.75 inches   Waist to Hip Ratio 1.02 %   BMI (Calculated) 31.3      Nutrition:     Nutrition Therapy & Goals - 06/21/15 1459    Nutrition Therapy   Diet   Instructed on a meal plan based on 1700 calories; DASH diet principles, Patient's wife was present for instruction.   Drug/Food Interactions Coumadin/Vit K;Statins/Certain Fruits   Fiber 30 grams   Whole Grain Foods 3 servings   Protein 7 ounces/day   Saturated Fats 11 max. grams   Fruits and Vegetables 5 servings/day   Personal Nutrition Goals   Personal Goal #1 Read labels for saturated fat, trans fat and sodium.   Personal Goal #2 Rinse canned vegetables or try "no salt added" canned vegetables, fresh  or frozen vegetables.   Personal Goal #3 To increase fruit to 1-2 servings per day.      Nutrition Discharge:     Rate Your Plate - [id number]    Rate Your Plate Scores   Pre Score 58   Pre Score % 64 %   Post Score 57   Post Score % 64 %   % Change 0 %      Education Questionnaire Score:     Knowledge Questionnaire Score - 07/26/15 1826    Knowledge Questionnaire Score   Post Score 22/28      Goals reviewed with patient; copy given to patient.  Discharge Instructions  Patient Details  Name: [name] MRN: [id number] Date of Birth: 1951-04-14 Referring Provider:  [name], *   Number of Visits: 36  Reason for Discharge:  Patient reached a stable level of exercise. Patient independent in their exercise.  Smoking History:  History  Smoking status  . Never Smoker   Smokeless tobacco  . Not on file    Diagnosis:  S/P CABG x 4  ST elevation myocardial infarction (STEMI), unspecified artery [facility])  Initial Exercise Prescription:     Initial Exercise Prescription - 06/01/15 1100  Date of Initial Exercise Prescription   Date 06/01/15   Treadmill   MPH 2   Grade 0   Minutes 10   Bike   Level 0.2   Minutes 15   Recumbant Bike   Level 3   RPM 40   Watts 25   Minutes 15   NuStep   Level 3   Watts 30   Minutes 15   Arm Ergometer   Level 1   Watts 10   Minutes 10   Arm/Foot Ergometer   Level 4   Watts 15   Minutes 10   Cybex   Level 3   RPM 50   Minutes 10   Recumbant Elliptical   Level 1   RPM 40   Watts 10   Minutes 15   Elliptical   Level 1   Speed 3   Minutes 1   REL-XR   Level 3   Watts 40   Minutes 15   Prescription Details   Frequency (times per week) 3   Duration Progress to 30 minutes of continuous aerobic without signs/symptoms of physical distress   Intensity   THRR REST +  30   Ratings of Perceived Exertion 11-15   Progression Continue progressive overload as per policy without  signs/symptoms or physical distress.   Resistance Training   Training Prescription Yes   Weight 2   Reps 10-15      Discharge Exercise Prescription (Final Exercise Prescription Changes):     Exercise Prescription Changes - 08/06/15 1100    Exercise Review   Progression Yes   Response to Exercise   Symptoms None   Comments Completed post 6MW test and improved by over 30%. Patient is approaching graduation of the program and home exercise plans were discussed. Details of the patient's exercise prescription and what they need to do in order to continue the prescription and progress with exercise were outlined and the patient verbalized understanding. The patient plans to complete all exercise at home. He is turning an extra room he has into a workout room and plans to use this with his wife. He also walks a mile on 5 days a week. He will also be going back to work full time which involve 3, 12 hour shifts.     Duration Progress to 50 minutes of aerobic without signs/symptoms of physical distress   Intensity Rest + 30   Progression Continue progressive overload as per policy without signs/symptoms or physical distress.   Resistance Training   Training Prescription Yes   Weight 4   Reps 10-15   Interval Training   Interval Training Yes   Equipment Treadmill;NuStep   Comments 2.7/0% 3 min, followed by 3.0/3% for 1 minute. Repeat for 15 min; T5 nustep intervals on L5 from 35-50 watts.   Treadmill   MPH 3.5   Grade 3   Minutes 20   Recumbant Bike   Level 7   RPM 50   Minutes 20   T5 Nustep   Level 5   Watts 35   Minutes 15   Home Exercise Plan   Plans to continue exercise at Home  Making an exercise room at home.       Functional Capacity:     6 Minute Walk      06/01/15 1053 08/06/15 0848     6 Minute Walk   Phase Initial Discharge    Distance 1170 feet 1550 feet    Distance % Change  32 %  Walk Time 6 minutes 6 minutes    Resting HR 65 bpm 78 bpm    Resting BP  112/62 mmHg 126/70 mmHg    Max Ex. HR 101 bpm 87 bpm    Max Ex. BP 114/54 mmHg 140/68 mmHg    RPE 12 13    Symptoms No No       Quality of Life:     Quality of Life - 07/26/15 1827    Quality of Life Scores   Health/Function Pre 23.83 %   Health/Function Post 20.97 %   Health/Function % Change -12 %   Socioeconomic Pre 30 %   Socioeconomic Post 23.07 %   Socioeconomic % Change -23 %   Psych/Spiritual Pre 30 %   Psych/Spiritual Post 23.64 %   Psych/Spiritual % Change -21 %   Family Pre 30 %   Family Post 22.7 %   Family % Change -24 %   GLOBAL Pre 27.36 %   GLOBAL Post 22.21 %   GLOBAL % Change -18 %      Personal Goals: Goals established at orientation with interventions provided to work toward goal.     Personal Goals and Risk Factors at Admission - 06/01/15 1008    Personal Goals and Risk Factors on Admission    Weight Management Yes   Intervention Learn and follow the exercise and diet guidelines while in the program. Utilize the nutrition and education classes to help gain knowledge of the diet and exercise expectations in the program   Admit Weight 192 lb (87.091 kg)   Goal Weight 185 lb (83.915 kg)   Increase Aerobic Exercise and Physical Activity Yes;Sedentary  HAd physically active job, no routine exercise program.   Take Less Medication Yes   Intervention Learn your risk factors and begin the lifestyle modifications for risk factor control during your time in the program.   Understand more about Heart/Pulmonary Disease. Yes   Intervention While in program utilize professionals for any questions, and attend the education sessions. Great websites to use are www.americanheart.org or www.lung.org for reliable information.   Diabetes No   Hypertension Yes   Goal Participant will see blood pressure controlled within the values of 140/75mm/Hg or within value directed by their physician.   Intervention Provide nutrition & aerobic exercise along with prescribed  medications to achieve BP 140/90 or less.   Lipids Yes   Goal Cholesterol controlled with medications as prescribed, with individualized exercise RX and with personalized nutrition plan. Value goals: LDL < 70mg , HDL > 40mg . Participant states understanding of desired cholesterol values and following prescriptions.   Intervention Provide nutrition & aerobic exercise along with prescribed medications to achieve LDL 70mg , HDL >40mg .   Stress No       Personal Goals Discharge:     Goals and Risk Factor Review - 07/16/15 0954    Increase Aerobic Exercise and Physical Activity   Goals Progress/Improvement seen  Yes   Comments States he is feeling good about his exercise progression.   Hypertension   Progress seen toward goals Yes   Comments Vitlas in good range   Abnormal Lipids   Progress seen towards goals Unknown   Comments No labs reported.  Tarron did state that his Triglycerides are elevated.  He was not given any plan to help contol this concern.  Will provide to [facility] a list of foods to avoid/eat to help with his triglyceride level.  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 He will try to make  plan while here in the program ,so he will be prepared to make good food choices at home and on the road.      Nutrition & Weight - Outcomes:     Pre Biometrics - 06/01/15 1053    Pre Biometrics   Height 5' 7.25" (1.708 m)   Weight 200 lb 8 oz (90.946 kg)   Waist Circumference 42 inches   Hip Circumference 40.5 inches   Waist to Hip Ratio 1.04 %   BMI (Calculated) 31.2         Post Biometrics - 08/06/15 0845     Post  Biometrics   Height 5' 7.25" (1.708 m)   Weight 201 lb 1.6 oz (91.218 kg)   Waist Circumference 41.5 inches   Hip Circumference 40.75 inches   Waist to Hip Ratio 1.02 %   BMI (Calculated) 31.3      Nutrition:     Nutrition Therapy & Goals - 06/21/15 1459    Nutrition Therapy   Diet   Instructed on a  meal plan based on 1700 calories; DASH diet principles, Patient's wife was present for instruction.   Drug/Food Interactions Coumadin/Vit K;Statins/Certain Fruits   Fiber 30 grams   Whole Grain Foods 3 servings   Protein 7 ounces/day   Saturated Fats 11 max. grams   Fruits and Vegetables 5 servings/day   Personal Nutrition Goals   Personal Goal #1 Read labels for saturated fat, trans fat and sodium.   Personal Goal #2 Rinse canned vegetables or try "no salt added" canned vegetables, fresh or frozen vegetables.   Personal Goal #3 To increase fruit to 1-2 servings per day.      Nutrition Discharge:     Rate Your Plate - [id number]    Rate Your Plate Scores   Pre Score 58   Pre Score % 64 %   Post Score 57   Post Score % 64 %   % Change 0 %      Education Questionnaire Score:     Knowledge Questionnaire Score - 07/26/15 1826    Knowledge Questionnaire Score   Post Score 22/28      Goals reviewed with patient; copy given to patient.

## 2015-08-11 NOTE — Progress Notes (Signed)
Daily Session Note  Patient Details  Name: [name] MRN: [id number] Date of Birth: 04/20/1951 Referring Provider:  Birdie Sons, MD  Encounter Date: 08/11/2015  Check In:     Session Check In - 08/11/15 0816    Check-In   Staff Present Heath Lark, RN, BSN, CCRP;Renee Dillard Essex, MS, ACSM CEP, Exercise Physiologist;Ishitha Roper, BS, ACSM EP-C, Exercise Physiologist   ER physicians immediately available to respond to emergencies See telemetry face sheet for immediately available ER MD   Medication changes reported     No   Fall or balance concerns reported    No   Warm-up and Cool-down Performed on first and last piece of equipment   VAD Patient? No   Pain Assessment   Currently in Pain? No/denies         Goals Met:  Proper associated with RPD/PD & O2 Sat Exercise tolerated well No report of cardiac concerns or symptoms Strength training completed today  Goals Unmet:  Not Applicable  Goals Comments:    Dr. Emily Filbert is Medical Director for [facility].

## 2015-08-11 NOTE — Progress Notes (Signed)
Cardiac Individual Treatment Plan  Patient Details  Name: [name] MRN: [id number] Date of Birth: [birth year] Referring Provider:  [name], *  Initial Encounter Date:    Visit Diagnosis: S/P CABG x 4  ST elevation myocardial infarction (STEMI), unspecified artery ([place])  Patient's Home Medications on Admission:  Current outpatient prescriptions:  .  aspirin 81 MG EC tablet, Take 1 tablet (81 mg total) by mouth daily., Disp: , Rfl:  .  atorvastatin (LIPITOR) 80 MG tablet, Take 1 tablet (80 mg total) by mouth daily at 6 PM., Disp: 90 tablet, Rfl: 3 .  clopidogrel (PLAVIX) 75 MG tablet, Take 1 tablet (75 mg total) by mouth daily., Disp: 90 tablet, Rfl: 3 .  ENSURE PLUS (ENSURE PLUS) LIQD, Take 237 mLs by mouth daily., Disp: , Rfl:  .  lisinopril (PRINIVIL,ZESTRIL) 10 MG tablet, Take 1 tablet (10 mg total) by mouth daily., Disp: 90 tablet, Rfl: 3 .  tamsulosin (FLOMAX) 0.4 MG CAPS capsule, Take 1 capsule (0.4 mg total) by mouth daily., Disp: 30 capsule, Rfl: 0  Past Medical History: Past Medical History  Diagnosis Date  . Kidney stones     a. May 2016.  Marland Kitchen Coronary atherosclerosis     a. CT scan 01/02/2015: extensive coronary atherosclerosis;  b. 04/2015 Inf STEMI/Cath/CABG x 4: LIMA->LAD, VG->Diag, VG->OM2, VG->RPL.  Marland Kitchen HTN (hypertension)   . HLD (hyperlipidemia)   . Postoperative atrial fibrillation ([place])     a. 04/2015 Post-op CABG AF/AFlutter--> converted with rapid atrial pacing and amio; b. CHA2DS2VASc = 3->Coumadin.  . Complete heart block ([place])     a. 04/2015 in setting of inferior STEMI-->resolved.  . Ischemic cardiomyopathy     a. 04/2015 LV gram: EF 35-40%.    Tobacco Use: History  Smoking status  . Never Smoker   Smokeless tobacco  . Not on file    Labs: Recent Review Flowsheet Data    Labs for ITP Cardiac and Pulmonary Rehab Latest Ref Rng 05/03/2015 05/04/2015 05/04/2015 05/04/2015 07/05/2015   Cholestrol 100 - 199 mg/dL - - - - 170   LDLCALC 0 -  99 mg/dL - - - - 82   HDL >39 mg/dL - - - - 29(L)   Trlycerides 0 - 149 mg/dL - - - - 293(H)   PHART 7.350 - 7.450 7.309(L) 7.404 7.429 - -   PCO2ART 35.0 - 45.0 mmHg 44.0 39.7 42.0 - -   HCO3 20.0 - 24.0 mEq/L 22.3 24.9(H) 27.9(H) - -   TCO2 0 - 100 mmol/L '24 26 29 24 ' -   ACIDBASEDEF 0.0 - 2.0 mmol/L 4.0(H) - - - -   O2SAT - 89.0 97.0 96.0 - -       Exercise Target Goals:    Exercise Program Goal: Individual exercise prescription set with THRR, safety & activity barriers. Participant demonstrates ability to understand and report RPE using BORG scale, to self-measure pulse accurately, and to acknowledge the importance of the exercise prescription.  Exercise Prescription Goal: Starting with aerobic activity 30 plus minutes a day, 3 days per week for initial exercise prescription. Provide home exercise prescription and guidelines that participant acknowledges understanding prior to discharge.  Activity Barriers & Risk Stratification:     Activity Barriers & Risk Stratification - 06/01/15 1004    Activity Barriers & Risk Stratification   Activity Barriers Right Hip Replacement      6 Minute Walk:     6 Minute Walk      06/01/15 1053 08/06/15 0848  6 Minute Walk   Phase Initial Discharge    Distance 1170 feet 1550 feet    Distance % Change  32 %    Walk Time 6 minutes 6 minutes    Resting HR 65 bpm 78 bpm    Resting BP 112/62 mmHg 126/70 mmHg    Max Ex. HR 101 bpm 87 bpm    Max Ex. BP 114/54 mmHg 140/68 mmHg    RPE 12 13    Symptoms No No       Initial Exercise Prescription:     Initial Exercise Prescription - 06/01/15 1100    Date of Initial Exercise Prescription   Date 06/01/15   Treadmill   MPH 2   Grade 0   Minutes 10   Bike   Level 0.2   Minutes 15   Recumbant Bike   Level 3   RPM 40   Watts 25   Minutes 15   NuStep   Level 3   Watts 30   Minutes 15   Arm Ergometer   Level 1   Watts 10   Minutes 10   Arm/Foot Ergometer   Level 4    Watts 15   Minutes 10   Cybex   Level 3   RPM 50   Minutes 10   Recumbant Elliptical   Level 1   RPM 40   Watts 10   Minutes 15   Elliptical   Level 1   Speed 3   Minutes 1   REL-XR   Level 3   Watts 40   Minutes 15   Prescription Details   Frequency (times per week) 3   Duration Progress to 30 minutes of continuous aerobic without signs/symptoms of physical distress   Intensity   THRR REST +  30   Ratings of Perceived Exertion 11-15   Progression Continue progressive overload as per policy without signs/symptoms or physical distress.   Resistance Training   Training Prescription Yes   Weight 2   Reps 10-15      Exercise Prescription Changes:     Exercise Prescription Changes      06/08/15 0700 06/16/15 0800 06/23/15 0800 07/05/15 1500 07/14/15 0900   Exercise Review   Progression No  Has not started program yet; med review was on 06/01/15 Yes Yes Yes Yes   Response to Exercise   Blood Pressure (Admit)    132/70 mmHg    Blood Pressure (Exercise)    168/82 mmHg    Blood Pressure (Exit)    132/68 mmHg    Heart Rate (Admit)    72 bpm    Heart Rate (Exercise)    93 bpm    Heart Rate (Exit)    77 bpm    Rating of Perceived Exertion (Exercise)    14    Symptoms   None None None   Comments   Reviewed individualized exercise prescription and made increases per departmental policy. Exercise increases were discussed with the patient and they were able to perform the new work loads without issue (no signs or symptoms).  Briefed patient on interval training and encouarged him to routinely add this training into his sessions during rehab. Explained how HIIT relates to progression and the importance of that throughout the program.    Duration   Progress to 50 minutes of aerobic without signs/symptoms of physical distress Progress to 50 minutes of aerobic without signs/symptoms of physical distress Progress to 50 minutes of aerobic without signs/symptoms of  physical distress    Intensity   Rest + 30 Rest + 30 Rest + 30   Progression   Continue progressive overload as per policy without signs/symptoms or physical distress. Continue progressive overload as per policy without signs/symptoms or physical distress. Continue progressive overload as per policy without signs/symptoms or physical distress.   Resistance Training   Training Prescription   Yes Yes Yes   Weight   '4 4 4   ' Reps   10-15 10-15 10-15   Interval Training   Interval Training   No Yes Yes   Equipment    Treadmill Treadmill   Comments    2.7/0% 3 min, followed by 3.0/1% for 1 minute. Repeat for 15 min. 2.7/0% 3 min, followed by 3.0/1% for 1 minute. Repeat for 15 min.   Treadmill   MPH  2.2 2.6 2.6 3.5   Grade  0 '1 1 1   ' Minutes  '15 15 15 15   ' Recumbant Bike   Level  '7 7 7 7   ' RPM  50 50 50 50   Minutes  '20 20 20 20   ' T5 Nustep   Level     4   Watts     30     07/21/15 0800 07/30/15 0713 08/06/15 1100       Exercise Review   Progression Yes Yes Yes     Response to Exercise   Blood Pressure (Admit)  132/76 mmHg      Blood Pressure (Exercise)  178/68 mmHg      Blood Pressure (Exit)  118/70 mmHg      Heart Rate (Admit)  80 bpm      Heart Rate (Exercise)  106 bpm      Heart Rate (Exit)  81 bpm      Rating of Perceived Exertion (Exercise)  14      Symptoms None None None     Comments Discussed with patient about adding two days of home exercise to current program.  Voiced understanding, and will make an attempt to do more walking at home. Sullivan has maxed out his walking speed, in order to continue to progress him an incline of 2-3% has been added to his intervals. He is also exercising at home on 2d/wk, He walks at home. He has also maxed out on his continuous exercise time and the intervals serve to provide a challenge amongst that time.  Completed post 6MW test and improved by over 30%. Patient is approaching graduation of the program and home exercise plans were discussed. Details of the patient's  exercise prescription and what they need to do in order to continue the prescription and progress with exercise were outlined and the patient verbalized understanding. The patient plans to complete all exercise at home. He is turning an extra room he has into a workout room and plans to use this with his wife. He also walks a mile on 5 days a week. He will also be going back to work full time which involve 3, 12 hour shifts.       Frequency Add 2 additional days to program exercise sessions.       Duration Progress to 50 minutes of aerobic without signs/symptoms of physical distress Progress to 50 minutes of aerobic without signs/symptoms of physical distress Progress to 50 minutes of aerobic without signs/symptoms of physical distress     Intensity Rest + 30 Rest + 30 Rest + 30     Progression Continue progressive  overload as per policy without signs/symptoms or physical distress. Continue progressive overload as per policy without signs/symptoms or physical distress. Continue progressive overload as per policy without signs/symptoms or physical distress.     Resistance Training   Training Prescription Yes Yes Yes     Weight '4 4 4     ' Reps 10-15 10-15 10-15     Interval Training   Interval Training Yes Yes Yes     Equipment Treadmill;NuStep Treadmill;NuStep Treadmill;NuStep     Comments 2.7/0% 3 min, followed by 3.0/1% for 1 minute. Repeat for 15 min; T5 nustep intervals on L5 from 35-50 watts. 2.7/0% 3 min, followed by 3.0/3% for 1 minute. Repeat for 15 min; T5 nustep intervals on L5 from 35-50 watts. 2.7/0% 3 min, followed by 3.0/3% for 1 minute. Repeat for 15 min; T5 nustep intervals on L5 from 35-50 watts.     Treadmill   MPH 3.5 3.5 3.5     Grade '1 3 3     ' Minutes '15 20 20     ' Recumbant Bike   Level '7 7 7     ' RPM 50 50 50     Minutes '20 20 20     ' T5 Nustep   Level '5 5 5     ' Watts 35 35 35     Minutes  15 15     Home Exercise Plan   Plans to continue exercise at   Home  Making an  exercise room at home.         Discharge Exercise Prescription (Final Exercise Prescription Changes):     Exercise Prescription Changes - 08/06/15 1100    Exercise Review   Progression Yes   Response to Exercise   Symptoms None   Comments Completed post 6MW test and improved by over 30%. Patient is approaching graduation of the program and home exercise plans were discussed. Details of the patient's exercise prescription and what they need to do in order to continue the prescription and progress with exercise were outlined and the patient verbalized understanding. The patient plans to complete all exercise at home. He is turning an extra room he has into a workout room and plans to use this with his wife. He also walks a mile on 5 days a week. He will also be going back to work full time which involve 3, 12 hour shifts.     Duration Progress to 50 minutes of aerobic without signs/symptoms of physical distress   Intensity Rest + 30   Progression Continue progressive overload as per policy without signs/symptoms or physical distress.   Resistance Training   Training Prescription Yes   Weight 4   Reps 10-15   Interval Training   Interval Training Yes   Equipment Treadmill;NuStep   Comments 2.7/0% 3 min, followed by 3.0/3% for 1 minute. Repeat for 15 min; T5 nustep intervals on L5 from 35-50 watts.   Treadmill   MPH 3.5   Grade 3   Minutes 20   Recumbant Bike   Level 7   RPM 50   Minutes 20   T5 Nustep   Level 5   Watts 35   Minutes 15   Home Exercise Plan   Plans to continue exercise at Home  Making an exercise room at home.       Nutrition:  Target Goals: Understanding of nutrition guidelines, daily intake of sodium <1545m, cholesterol <2057m calories 30% from fat and 7% or less from saturated fats, daily to  have 5 or more servings of fruits and vegetables.  Biometrics:     Pre Biometrics - 06/01/15 1053    Pre Biometrics   Height 5' 7.25" (1.708 m)   Weight 200 lb  8 oz (90.946 kg)   Waist Circumference 42 inches   Hip Circumference 40.5 inches   Waist to Hip Ratio 1.04 %   BMI (Calculated) 31.2         Post Biometrics - 08/06/15 0845     Post  Biometrics   Height 5' 7.25" (1.708 m)   Weight 201 lb 1.6 oz (91.218 kg)   Waist Circumference 41.5 inches   Hip Circumference 40.75 inches   Waist to Hip Ratio 1.02 %   BMI (Calculated) 31.3      Nutrition Therapy Plan and Nutrition Goals:     Nutrition Therapy & Goals - 06/21/15 1459    Nutrition Therapy   Diet   Instructed on a meal plan based on 1700 calories; DASH diet principles, Patient's wife was present for instruction.   Drug/Food Interactions Coumadin/Vit K;Statins/Certain Fruits   Fiber 30 grams   Whole Grain Foods 3 servings   Protein 7 ounces/day   Saturated Fats 11 max. grams   Fruits and Vegetables 5 servings/day   Personal Nutrition Goals   Personal Goal #1 Read labels for saturated fat, trans fat and sodium.   Personal Goal #2 Rinse canned vegetables or try "no salt added" canned vegetables, fresh or frozen vegetables.   Personal Goal #3 To increase fruit to 1-2 servings per day.      Nutrition Discharge: Rate Your Plate Scores:     Rate Your Plate - 35/70/17 7939    Rate Your Plate Scores   Pre Score 58   Pre Score % 64 %   Post Score 57   Post Score % 64 %   % Change 0 %      Nutrition Goals Re-Evaluation:   Psychosocial: Target Goals: Acknowledge presence or absence of depression, maximize coping skills, provide positive support system. Participant is able to verbalize types and ability to use techniques and skills needed for reducing stress and depression.  Initial Review & Psychosocial Screening:     Initial Psych Review & Screening - 06/01/15 1007    Initial Review   Current issues with Current Sleep Concerns  Sleeping concerns when first discharged. Has improved since healing process  has made him able to sleep on his side.    Family Dynamics    Good Support System? --  Good support system at home. Wife,daughter and son.   Barriers   Psychosocial barriers to participate in program There are no identifiable barriers or psychosocial needs.;The patient should benefit from training in stress management and relaxation.   Screening Interventions   Interventions Encouraged to exercise;Program counselor consult      Quality of Life Scores:     Quality of Life - 07/26/15 1827    Quality of Life Scores   Health/Function Pre 23.83 %   Health/Function Post 20.97 %   Health/Function % Change -12 %   Socioeconomic Pre 30 %   Socioeconomic Post 23.07 %   Socioeconomic % Change -23 %   Psych/Spiritual Pre 30 %   Psych/Spiritual Post 23.64 %   Psych/Spiritual % Change -21 %   Family Pre 30 %   Family Post 22.7 %   Family % Change -24 %   GLOBAL Pre 27.36 %   GLOBAL Post 22.21 %  GLOBAL % Change -18 %      PHQ-9:     Recent Review Flowsheet Data    Depression screen [facility] 2/9 07/26/2015 06/01/2015   Decreased Interest 0 0   Down, Depressed, Hopeless 0 0   PHQ - 2 Score 0 0   Altered sleeping 0 3   Tired, decreased energy - 1   Change in appetite 0 0   Feeling bad or failure about yourself  0 0   Trouble concentrating 0 0   Moving slowly or fidgety/restless 0 0   Suicidal thoughts 0 0   PHQ-9 Score 0 4   Difficult doing work/chores Not difficult at all Not difficult at all      Psychosocial Evaluation and Intervention:     Psychosocial Evaluation - 08/11/15 0957    Discharge Psychosocial Assessment & Intervention   Comments Counselor met with Mr. Spratlin today for discharge evaluation.  He reports how helpful this program has been as he is feeling better, more cognizant of how to eat healthier, and the educational components of this program were beneficial as well.  Mr. Latour already walks approximately 2 miles daily and he plans to make his garage into a workout facility in the near future to maintain the progress he  has made here.  Counselor commended Mr. Viona Gilmore for his hard work  in this program and wished him well in the future of taking better care of himself.      Psychosocial Re-Evaluation:     Psychosocial Re-Evaluation      06/23/15 1110           Psychosocial Re-Evaluation   Interventions Encouraged to attend Cardiac Rehabilitation for the exercise       Comments Ezekial talked about how when he gets stressed he has a shorter temper. Ways to help with stress where taught in the Stress Cardiac REhab education class today. 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           Vocational Rehabilitation: Provide vocational rehab assistance to qualifying candidates.   Vocational Rehab Evaluation & Intervention:     Vocational Rehab - 06/01/15 1004    Initial Vocational Rehab Evaluation & Intervention   Assessment shows need for Vocational Rehabilitation No      Education: Education Goals: Education classes will be provided on a weekly basis, covering required topics. Participant will state understanding/return demonstration of topics presented.  Learning Barriers/Preferences:     Learning Barriers/Preferences - 06/01/15 1004    Learning Barriers/Preferences   Learning Barriers None   Learning Preferences Group Instruction      Education Topics: General Nutrition Guidelines/Fats and Fiber: -Group instruction provided by verbal, written material, models and posters to present the general guidelines for heart healthy nutrition. Gives an explanation and review of dietary fats and fiber.          Cardiac Rehab from 08/11/2015 in [facility] Cardiac and Pulmonary Rehab   Date  07/12/15   Educator  CR   Instruction Review Code  2- meets goals/outcomes      Controlling Sodium/Reading Food Labels: -Group verbal and written material supporting the discussion of sodium use in heart healthy nutrition.  Review and explanation with models, verbal and written materials for utilization of the food label.      Cardiac Rehab from 08/11/2015 in [facility] Cardiac and Pulmonary Rehab   Date  07/19/15  Educator  CR   Instruction Review Code  2- meets goals/outcomes      Exercise Physiology & Risk Factors: - Group verbal and written instruction with models to review the exercise physiology of the cardiovascular system and associated critical values. Details cardiovascular disease risk factors and the goals associated with each risk factor.      Cardiac Rehab from 08/11/2015 in [facility] Cardiac and Pulmonary Rehab   Date  08/11/15   Educator  RM   Instruction Review Code  2- meets goals/outcomes      Aerobic Exercise & Resistance Training: - Gives group verbal and written discussion on the health impact of inactivity. On the components of aerobic and resistive training programs and the benefits of this training and how to safely progress through these programs.      Cardiac Rehab from 08/11/2015 in [name] Cardiac and Pulmonary Rehab   Date  06/14/15   Educator  RM   Instruction Review Code  2- meets goals/outcomes      Flexibility, Balance, General Exercise Guidelines: - Provides group verbal and written instruction on the benefits of flexibility and balance training programs. Provides general exercise guidelines with specific guidelines to those with heart or lung disease. Demonstration and skill practice provided.      Cardiac Rehab from 08/11/2015 in [facility] Cardiac and Pulmonary Rehab   Date  06/21/15   Educator  RM   Instruction Review Code  2- meets goals/outcomes      Stress Management: - Provides group verbal and written instruction about the health risks of elevated stress, cause of high stress, and healthy ways to reduce stress.      Cardiac Rehab from 08/11/2015 in [facility] Cardiac and Pulmonary Rehab   Date  06/23/15   Educator  [facility]   Instruction Review Code  2- meets goals/outcomes       Depression: - Provides group verbal and written instruction on the correlation between heart/lung disease and depressed mood, treatment options, and the stigmas associated with seeking treatment.      Cardiac Rehab from 08/11/2015 in [facility] Cardiac and Pulmonary Rehab   Date  07/21/15   Educator  [facility]   Instruction Review Code  2- meets goals/outcomes      Anatomy & Physiology of the Heart: - Group verbal and written instruction and models provide basic cardiac anatomy and physiology, with the coronary electrical and arterial systems. Review of: AMI, Angina, Valve disease, Heart Failure, Cardiac Arrhythmia, Pacemakers, and the ICD.      Cardiac Rehab from 08/11/2015 in [facility] Cardiac and Pulmonary Rehab   Date  06/28/15   Educator  SB   Instruction Review Code  2- meets goals/outcomes      Cardiac Procedures: - Group verbal and written instruction and models to describe the testing methods done to diagnose heart disease. Reviews the outcomes of the test results. Describes the treatment choices: Medical Management, Angioplasty, or Coronary Bypass Surgery.      Cardiac Rehab from 08/11/2015 in [facility] Cardiac and Pulmonary Rehab   Date  07/26/15   Educator  SB   Instruction Review Code  2- meets goals/outcomes      Cardiac Medications: - Group verbal and written instruction to review commonly prescribed medications for heart disease. Reviews the medication, class of the drug, and side effects. Includes the steps to properly store meds and maintain the prescription regimen.      Cardiac Rehab from 08/11/2015 in [facility] Cardiac and Pulmonary Rehab   Date  08/04/15  Educator  SB   Instruction Review Code  2- meets goals/outcomes      Go Sex-Intimacy & Heart Disease, Get SMART - Goal Setting: - Group verbal and written instruction through game format to discuss heart disease and the return to sexual intimacy. Provides group verbal and written material to discuss and apply goal setting through the  application of the S.M.A.R.T. Method.      Cardiac Rehab from 08/11/2015 in [facility] Cardiac and Pulmonary Rehab   Date  07/26/15   Educator  SB   Instruction Review Code  2- meets goals/outcomes      Other Matters of the Heart: - Provides group verbal, written materials and models to describe Heart Failure, Angina, Valve Disease, and Diabetes in the realm of heart disease. Includes description of the disease process and treatment options available to the cardiac patient.   Exercise & Equipment Safety: - Individual verbal instruction and demonstration of equipment use and safety with use of the equipment.      Cardiac Rehab from 08/11/2015 in [facility] Cardiac and Pulmonary Rehab   Date  06/01/15   Educator  Sb   Instruction Review Code  2- meets goals/outcomes      Infection Prevention: - Provides verbal and written material to individual with discussion of infection control including proper hand washing and proper equipment cleaning during exercise session.      Cardiac Rehab from 08/11/2015 in [facility] Cardiac and Pulmonary Rehab   Date  06/01/15   Educator  SB   Instruction Review Code  2- meets goals/outcomes      Falls Prevention: - Provides verbal and written material to individual with discussion of falls prevention and safety.      Cardiac Rehab from 08/11/2015 in [facility] Cardiac and Pulmonary Rehab   Date  06/01/15   Educator  SB   Instruction Review Code  2- meets goals/outcomes      Diabetes: - Individual verbal and written instruction to review signs/symptoms of diabetes, desired ranges of glucose level fasting, after meals and with exercise. Advice that pre and post exercise glucose checks will be done for 3 sessions at entry of program.    Knowledge Questionnaire Score:     Knowledge Questionnaire Score - 07/26/15 1826    Knowledge Questionnaire Score   Post Score 22/28      Personal Goals and Risk Factors at Admission:     Personal Goals and Risk Factors at Admission -  06/01/15 1008    Personal Goals and Risk Factors on Admission    Weight Management Yes   Intervention Learn and follow the exercise and diet guidelines while in the program. Utilize the nutrition and education classes to help gain knowledge of the diet and exercise expectations in the program   Admit Weight 192 lb (87.091 kg)   Goal Weight 185 lb (83.915 kg)   Increase Aerobic Exercise and Physical Activity Yes;Sedentary  HAd physically active job, no routine exercise program.   Take Less Medication Yes   Intervention Learn your risk factors and begin the lifestyle modifications for risk factor control during your time in the program.   Understand more about Heart/Pulmonary Disease. Yes   Intervention While in program utilize professionals for any questions, and attend the education sessions. Great websites to use are www.americanheart.org or www.lung.org for reliable information.   Diabetes No   Hypertension Yes   Goal Participant will see blood pressure controlled within the values of 140/27m/Hg or within value directed by their physician.  Intervention Provide nutrition & aerobic exercise along with prescribed medications to achieve BP 140/90 or less.   Lipids Yes   Goal Cholesterol controlled with medications as prescribed, with individualized exercise RX and with personalized nutrition plan. Value goals: LDL < 2m, HDL > 466m Participant states understanding of desired cholesterol values and following prescriptions.   Intervention Provide nutrition & aerobic exercise along with prescribed medications to achieve LDL <7038mHDL >73m10m Stress No      Personal Goals and Risk Factors Review:      Goals and Risk Factor Review      06/23/15 1108 07/16/15 0954         Increase Aerobic Exercise and Physical Activity   Goals Progress/Improvement seen  Yes Yes      Comments KeitRandelends Cardiac Rehab regularly plus he said today that he is able to push his grandson in a stroller and  usually walks a mile every day (including today when he attended Cardiac REhab already).  States he is feeling good about his exercise progression.      Hypertension   Goal --  Jaelen's blood presure is very stable at 120/70 today and other days similiar.        Progress seen toward goals  Yes      Comments  Vitlas in good range      Abnormal Lipids   Goal --  KeitAimar follow up fasting blood work on Jul 05, 2015,       Progress seen towards goals  Unknown      Comments  No labs reported.  KeitRaynor state that his Triglycerides are elevated.  He was not given any plan to help contol this concern.  [facility] of foods to avoid/eat to help with his triglyceride level.  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 He will try to make  plan while here in the program ,so he will be prepared to make good food choices at home and on the road.         Personal Goals Discharge (Final Personal Goals and Risk Factors Review):      Goals and Risk Factor Review - 07/16/15 0954    Increase Aerobic Exercise and Physical Activity   Goals Progress/Improvement seen  Yes   Comments States he is feeling good about his exercise progression.   Hypertension   Progress seen toward goals Yes   Comments Vitlas in good range   Abnormal Lipids   Progress seen towards goals Unknown   Comments No labs reported.  KeitIvis state that his Triglycerides are elevated.  He was not given any plan to help contol this concern.  Will provide to [facility] of foods to avoid/eat to help with his triglyceride level.  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 He will try to make  plan while here in the program ,so he will be prepared to make good food choices at home and on the road.      ITP Comments:     ITP Comments      07/12/15 1616 08/08/15 1243 08/11/15 1023       ITP Comments 30 day review  preparation Ready for 30 day review.  Continue with ITP   Preparing for discharge discharged today  completed 36 sessions  Comments: discharged

## 2015-08-11 NOTE — Progress Notes (Signed)
Discharge Summary  Patient Details  Name: [name] MRN: [id number] Date of Birth: [birth year] Referring Provider:  [name], *   Number of Visits: 36  Reason for Discharge:  Patient reached a stable level of exercise. Patient independent in their exercise.  Smoking History:  History  Smoking status  . Never Smoker   Smokeless tobacco  . Not on file    Diagnosis:  S/P CABG x 4  ST elevation myocardial infarction (STEMI), unspecified artery (HCC)  ADL UCSD:   Initial Exercise Prescription:     Initial Exercise Prescription - 06/01/15 1100    Date of Initial Exercise Prescription   Date 06/01/15   Treadmill   MPH 2   Grade 0   Minutes 10   Bike   Level 0.2   Minutes 15   Recumbant Bike   Level 3   RPM 40   Watts 25   Minutes 15   NuStep   Level 3   Watts 30   Minutes 15   Arm Ergometer   Level 1   Watts 10   Minutes 10   Arm/Foot Ergometer   Level 4   Watts 15   Minutes 10   Cybex   Level 3   RPM 50   Minutes 10   Recumbant Elliptical   Level 1   RPM 40   Watts 10   Minutes 15   Elliptical   Level 1   Speed 3   Minutes 1   REL-XR   Level 3   Watts 40   Minutes 15   Prescription Details   Frequency (times per week) 3   Duration Progress to 30 minutes of continuous aerobic without signs/symptoms of physical distress   Intensity   THRR REST +  30   Ratings of Perceived Exertion 11-15   Progression Continue progressive overload as per policy without signs/symptoms or physical distress.   Resistance Training   Training Prescription Yes   Weight 2   Reps 10-15      Discharge Exercise Prescription (Final Exercise Prescription Changes):     Exercise Prescription Changes - 08/06/15 1100    Exercise Review   Progression Yes   Response to Exercise   Symptoms None   Comments Completed post 6MW test and improved by over 30%. Patient is approaching graduation of the program and home exercise plans were discussed.  Details of the patient's exercise prescription and what they need to do in order to continue the prescription and progress with exercise were outlined and the patient verbalized understanding. The patient plans to complete all exercise at home. He is turning an extra room he has into a workout room and plans to use this with his wife. He also walks a mile on 5 days a week. He will also be going back to work full time which involve 3, 12 hour shifts.     Duration Progress to 50 minutes of aerobic without signs/symptoms of physical distress   Intensity Rest + 30   Progression Continue progressive overload as per policy without signs/symptoms or physical distress.   Resistance Training   Training Prescription Yes   Weight 4   Reps 10-15   Interval Training   Interval Training Yes   Equipment Treadmill;NuStep   Comments 2.7/0% 3 min, followed by 3.0/3% for 1 minute. Repeat for 15 min; T5 nustep intervals on L5 from 35-50 watts.   Treadmill   MPH 3.5   Grade 3  Minutes 20   Recumbant Bike   Level 7   RPM 50   Minutes 20   T5 Nustep   Level 5   Watts 35   Minutes 15   Home Exercise Plan   Plans to continue exercise at Home  Making an exercise room at home.       Functional Capacity:     6 Minute Walk      06/01/15 1053 08/06/15 0848     6 Minute Walk   Phase Initial Discharge    Distance 1170 feet 1550 feet    Distance % Change  32 %    Walk Time 6 minutes 6 minutes    Resting HR 65 bpm 78 bpm    Resting BP 112/62 mmHg 126/70 mmHg    Max Ex. HR 101 bpm 87 bpm    Max Ex. BP 114/54 mmHg 140/68 mmHg    RPE 12 13    Symptoms No No       Psychological, QOL, Others - Outcomes: PHQ 2/9: Depression screen [facility] 2/9 07/26/2015 06/01/2015  Decreased Interest 0 0  Down, Depressed, Hopeless 0 0  PHQ - 2 Score 0 0  Altered sleeping 0 3  Tired, decreased energy - 1  Change in appetite 0 0  Feeling bad or failure about yourself  0 0  Trouble concentrating 0 0  Moving slowly or  fidgety/restless 0 0  Suicidal thoughts 0 0  PHQ-9 Score 0 4  Difficult doing work/chores Not difficult at all Not difficult at all    Quality of Life:     Quality of Life - 07/26/15 1827    Quality of Life Scores   Health/Function Pre 23.83 %   Health/Function Post 20.97 %   Health/Function % Change -12 %   Socioeconomic Pre 30 %   Socioeconomic Post 23.07 %   Socioeconomic % Change -23 %   Psych/Spiritual Pre 30 %   Psych/Spiritual Post 23.64 %   Psych/Spiritual % Change -21 %   Family Pre 30 %   Family Post 22.7 %   Family % Change -24 %   GLOBAL Pre 27.36 %   GLOBAL Post 22.21 %   GLOBAL % Change -18 %      Personal Goals: Goals established at orientation with interventions provided to work toward goal.     Personal Goals and Risk Factors at Admission - 06/01/15 1008    Personal Goals and Risk Factors on Admission    Weight Management Yes   Intervention Learn and follow the exercise and diet guidelines while in the program. Utilize the nutrition and education classes to help gain knowledge of the diet and exercise expectations in the program   Admit Weight 192 lb (87.091 kg)   Goal Weight 185 lb (83.915 kg)   Increase Aerobic Exercise and Physical Activity Yes;Sedentary  HAd physically active job, no routine exercise program.   Take Less Medication Yes   Intervention Learn your risk factors and begin the lifestyle modifications for risk factor control during your time in the program.   Understand more about Heart/Pulmonary Disease. Yes   Intervention While in program utilize professionals for any questions, and attend the education sessions. Great websites to use are www.americanheart.org or www.lung.org for reliable information.   Diabetes No   Hypertension Yes   Goal Participant will see blood pressure controlled within the values of 140/41mm/Hg or within value directed by their physician.   Intervention Provide nutrition & aerobic exercise along  with prescribed  medications to achieve BP 140/90 or less.   Lipids Yes   Goal Cholesterol controlled with medications as prescribed, with individualized exercise RX and with personalized nutrition plan. Value goals: LDL < 70mg , HDL > 40mg . Participant states understanding of desired cholesterol values and following prescriptions.   Intervention Provide nutrition & aerobic exercise along with prescribed medications to achieve LDL 70mg , HDL >40mg .   Stress No       Personal Goals Discharge:     Goals and Risk Factor Review      06/23/15 1108 07/16/15 0954         Increase Aerobic Exercise and Physical Activity   Goals Progress/Improvement seen  Yes Yes      Comments Jeton attends Cardiac Rehab regularly plus he said today that he is able to push his grandson in a stroller and usually walks a mile every day (including today when he attended Cardiac REhab already).  States he is feeling good about his exercise progression.      Hypertension   Goal --  Muath's blood presure is very stable at 120/70 today and other days similiar.        Progress seen toward goals  Yes      Comments  Vitlas in good range      Abnormal Lipids   Goal --  Vir has follow up fasting blood work on Jul 05, 2015,       Progress seen towards goals  Unknown      Comments  No labs reported.  Fazal did state that his Triglycerides are elevated.  He was not given any plan to help contol this concern.  Will provide to [facility] a list of foods to avoid/eat to help with his triglyceride level.  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 He will try to make  plan while here in the program ,so he will be prepared to make good food choices at home and on the road.         Nutrition & Weight - Outcomes:     Pre Biometrics - 06/01/15 1053    Pre Biometrics   Height 5' 7.25" (1.708 m)   Weight 200 lb 8 oz (90.946 kg)   Waist Circumference 42 inches   Hip Circumference 40.5 inches    Waist to Hip Ratio 1.04 %   BMI (Calculated) 31.2         Post Biometrics - 08/06/15 0845     Post  Biometrics   Height 5' 7.25" (1.708 m)   Weight 201 lb 1.6 oz (91.218 kg)   Waist Circumference 41.5 inches   Hip Circumference 40.75 inches   Waist to Hip Ratio 1.02 %   BMI (Calculated) 31.3      Nutrition:     Nutrition Therapy & Goals - 06/21/15 1459    Nutrition Therapy   Diet   Instructed on a meal plan based on 1700 calories; DASH diet principles, Patient's wife was present for instruction.   Drug/Food Interactions Coumadin/Vit K;Statins/Certain Fruits   Fiber 30 grams   Whole Grain Foods 3 servings   Protein 7 ounces/day   Saturated Fats 11 max. grams   Fruits and Vegetables 5 servings/day   Personal Nutrition Goals   Personal Goal #1 Read labels for saturated fat, trans fat and sodium.   Personal Goal #2 Rinse canned vegetables or try "no salt added" canned vegetables, fresh or  frozen vegetables.   Personal Goal #3 To increase fruit to 1-2 servings per day.      Nutrition Discharge:     Rate Your Plate - [id number]    Rate Your Plate Scores   Pre Score 58   Pre Score % 64 %   Post Score 57   Post Score % 64 %   % Change 0 %      Education Questionnaire Score:     Knowledge Questionnaire Score - 07/26/15 1826    Knowledge Questionnaire Score   Post Score 22/28      Goals reviewed with patient; copy given to patient.

## 2015-08-11 NOTE — Progress Notes (Signed)
Discharge Summary  Patient Details  Name: [name] MRN: [id number] Date of Birth: 1951-08-04 Referring Provider:  [name], *   Number of Visits: 36  Reason for Discharge:  Patient reached a stable level of exercise. Patient independent in their exercise.  Smoking History:  History  Smoking status  . Never Smoker   Smokeless tobacco  . Not on file    Diagnosis:  S/P CABG x 4  ST elevation myocardial infarction (STEMI), unspecified artery (HCC)  ADL UCSD:   Initial Exercise Prescription:     Initial Exercise Prescription - 06/01/15 1100    Date of Initial Exercise Prescription   Date 06/01/15   Treadmill   MPH 2   Grade 0   Minutes 10   Bike   Level 0.2   Minutes 15   Recumbant Bike   Level 3   RPM 40   Watts 25   Minutes 15   NuStep   Level 3   Watts 30   Minutes 15   Arm Ergometer   Level 1   Watts 10   Minutes 10   Arm/Foot Ergometer   Level 4   Watts 15   Minutes 10   Cybex   Level 3   RPM 50   Minutes 10   Recumbant Elliptical   Level 1   RPM 40   Watts 10   Minutes 15   Elliptical   Level 1   Speed 3   Minutes 1   REL-XR   Level 3   Watts 40   Minutes 15   Prescription Details   Frequency (times per week) 3   Duration Progress to 30 minutes of continuous aerobic without signs/symptoms of physical distress   Intensity   THRR REST +  30   Ratings of Perceived Exertion 11-15   Progression Continue progressive overload as per policy without signs/symptoms or physical distress.   Resistance Training   Training Prescription Yes   Weight 2   Reps 10-15      Discharge Exercise Prescription (Final Exercise Prescription Changes):     Exercise Prescription Changes - 08/06/15 1100    Exercise Review   Progression Yes   Response to Exercise   Symptoms None   Comments Completed post 6MW test and improved by over 30%. Patient is approaching graduation of the program and home exercise plans were discussed.  Details of the patient's exercise prescription and what they need to do in order to continue the prescription and progress with exercise were outlined and the patient verbalized understanding. The patient plans to complete all exercise at home. He is turning an extra room he has into a workout room and plans to use this with his wife. He also walks a mile on 5 days a week. He will also be going back to work full time which involve 3, 12 hour shifts.     Duration Progress to 50 minutes of aerobic without signs/symptoms of physical distress   Intensity Rest + 30   Progression Continue progressive overload as per policy without signs/symptoms or physical distress.   Resistance Training   Training Prescription Yes   Weight 4   Reps 10-15   Interval Training   Interval Training Yes   Equipment Treadmill;NuStep   Comments 2.7/0% 3 min, followed by 3.0/3% for 1 minute. Repeat for 15 min; T5 nustep intervals on L5 from 35-50 watts.   Treadmill   MPH 3.5   Grade 3  Minutes 20   Recumbant Bike   Level 7   RPM 50   Minutes 20   T5 Nustep   Level 5   Watts 35   Minutes 15   Home Exercise Plan   Plans to continue exercise at Home  Making an exercise room at home.       Functional Capacity:     6 Minute Walk      06/01/15 1053 08/06/15 0848     6 Minute Walk   Phase Initial Discharge    Distance 1170 feet 1550 feet    Distance % Change  32 %    Walk Time 6 minutes 6 minutes    Resting HR 65 bpm 78 bpm    Resting BP 112/62 mmHg 126/70 mmHg    Max Ex. HR 101 bpm 87 bpm    Max Ex. BP 114/54 mmHg 140/68 mmHg    RPE 12 13    Symptoms No No       Psychological, QOL, Others - Outcomes: PHQ 2/9: Depression screen [facility] 2/9 07/26/2015 06/01/2015  Decreased Interest 0 0  Down, Depressed, Hopeless 0 0  PHQ - 2 Score 0 0  Altered sleeping 0 3  Tired, decreased energy - 1  Change in appetite 0 0  Feeling bad or failure about yourself  0 0  Trouble concentrating 0 0  Moving slowly or  fidgety/restless 0 0  Suicidal thoughts 0 0  PHQ-9 Score 0 4  Difficult doing work/chores Not difficult at all Not difficult at all    Quality of Life:     Quality of Life - 07/26/15 1827    Quality of Life Scores   Health/Function Pre 23.83 %   Health/Function Post 20.97 %   Health/Function % Change -12 %   Socioeconomic Pre 30 %   Socioeconomic Post 23.07 %   Socioeconomic % Change -23 %   Psych/Spiritual Pre 30 %   Psych/Spiritual Post 23.64 %   Psych/Spiritual % Change -21 %   Family Pre 30 %   Family Post 22.7 %   Family % Change -24 %   GLOBAL Pre 27.36 %   GLOBAL Post 22.21 %   GLOBAL % Change -18 %      Personal Goals: Goals established at orientation with interventions provided to work toward goal.     Personal Goals and Risk Factors at Admission - 06/01/15 1008    Personal Goals and Risk Factors on Admission    Weight Management Yes   Intervention Learn and follow the exercise and diet guidelines while in the program. Utilize the nutrition and education classes to help gain knowledge of the diet and exercise expectations in the program   Admit Weight 192 lb (87.091 kg)   Goal Weight 185 lb (83.915 kg)   Increase Aerobic Exercise and Physical Activity Yes;Sedentary  HAd physically active job, no routine exercise program.   Take Less Medication Yes   Intervention Learn your risk factors and begin the lifestyle modifications for risk factor control during your time in the program.   Understand more about Heart/Pulmonary Disease. Yes   Intervention While in program utilize professionals for any questions, and attend the education sessions. Great websites to use are www.americanheart.org or www.lung.org for reliable information.   Diabetes No   Hypertension Yes   Goal Participant will see blood pressure controlled within the values of 140/47mm/Hg or within value directed by their physician.   Intervention Provide nutrition & aerobic exercise along  with prescribed  medications to achieve BP 140/90 or less.   Lipids Yes   Goal Cholesterol controlled with medications as prescribed, with individualized exercise RX and with personalized nutrition plan. Value goals: LDL < 70mg , HDL > 40mg . Participant states understanding of desired cholesterol values and following prescriptions.   Intervention Provide nutrition & aerobic exercise along with prescribed medications to achieve LDL 70mg , HDL >40mg .   Stress No       Personal Goals Discharge:     Goals and Risk Factor Review      06/23/15 1108 07/16/15 0954         Increase Aerobic Exercise and Physical Activity   Goals Progress/Improvement seen  Yes Yes      Comments Jekhi attends Cardiac Rehab regularly plus he said today that he is able to push his grandson in a stroller and usually walks a mile every day (including today when he attended Cardiac REhab already).  States he is feeling good about his exercise progression.      Hypertension   Goal --  Stephano's blood presure is very stable at 120/70 today and other days similiar.        Progress seen toward goals  Yes      Comments  Vitlas in good range      Abnormal Lipids   Goal --  Bryan has follow up fasting blood work on Jul 05, 2015,       Progress seen towards goals  Unknown      Comments  No labs reported.  Navi did state that his Triglycerides are elevated.  He was not given any plan to help contol this concern.  Will provide to [facility] a list of foods to avoid/eat to help with his triglyceride level.  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 He will try to make  plan while here in the program ,so he will be prepared to make good food choices at home and on the road.         Nutrition & Weight - Outcomes:     Pre Biometrics - 06/01/15 1053    Pre Biometrics   Height 5' 7.25" (1.708 m)   Weight 200 lb 8 oz (90.946 kg)   Waist Circumference 42 inches   Hip Circumference 40.5 inches    Waist to Hip Ratio 1.04 %   BMI (Calculated) 31.2         Post Biometrics - 08/06/15 0845     Post  Biometrics   Height 5' 7.25" (1.708 m)   Weight 201 lb 1.6 oz (91.218 kg)   Waist Circumference 41.5 inches   Hip Circumference 40.75 inches   Waist to Hip Ratio 1.02 %   BMI (Calculated) 31.3      Nutrition:     Nutrition Therapy & Goals - 06/21/15 1459    Nutrition Therapy   Diet   Instructed on a meal plan based on 1700 calories; DASH diet principles, Patient's wife was present for instruction.   Drug/Food Interactions Coumadin/Vit K;Statins/Certain Fruits   Fiber 30 grams   Whole Grain Foods 3 servings   Protein 7 ounces/day   Saturated Fats 11 max. grams   Fruits and Vegetables 5 servings/day   Personal Nutrition Goals   Personal Goal #1 Read labels for saturated fat, trans fat and sodium.   Personal Goal #2 Rinse canned vegetables or try "no salt added" canned vegetables, fresh or  frozen vegetables.   Personal Goal #3 To increase fruit to 1-2 servings per day.      Nutrition Discharge:     Rate Your Plate - [id number]    Rate Your Plate Scores   Pre Score 58   Pre Score % 64 %   Post Score 57   Post Score % 64 %   % Change 0 %      Education Questionnaire Score:     Knowledge Questionnaire Score - 07/26/15 1826    Knowledge Questionnaire Score   Post Score 22/28      Goals reviewed with patient; copy given to patient.

## 2015-09-06 ENCOUNTER — Ambulatory Visit (INDEPENDENT_AMBULATORY_CARE_PROVIDER_SITE_OTHER): Payer: 59 | Admitting: Cardiovascular Disease

## 2015-09-06 ENCOUNTER — Encounter: Payer: Self-pay | Admitting: Cardiovascular Disease

## 2015-09-06 VITALS — BP 144/82 | HR 60 | Ht 66.0 in | Wt 202.8 lb

## 2015-09-06 DIAGNOSIS — I9789 Other postprocedural complications and disorders of the circulatory system, not elsewhere classified: Secondary | ICD-10-CM | POA: Diagnosis not present

## 2015-09-06 DIAGNOSIS — I10 Essential (primary) hypertension: Secondary | ICD-10-CM

## 2015-09-06 DIAGNOSIS — I442 Atrioventricular block, complete: Secondary | ICD-10-CM | POA: Diagnosis not present

## 2015-09-06 DIAGNOSIS — R0602 Shortness of breath: Secondary | ICD-10-CM | POA: Diagnosis not present

## 2015-09-06 DIAGNOSIS — I4891 Unspecified atrial fibrillation: Secondary | ICD-10-CM | POA: Diagnosis not present

## 2015-09-06 DIAGNOSIS — E785 Hyperlipidemia, unspecified: Secondary | ICD-10-CM

## 2015-09-06 DIAGNOSIS — Z951 Presence of aortocoronary bypass graft: Secondary | ICD-10-CM

## 2015-09-06 NOTE — Assessment & Plan Note (Signed)
He is doing very well with no symptoms suggestive of angina. He attended cardiac rehabilitation and did very well. Continue aggressive medical therapy. 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 Thus, per protocol, I requested a nuclear stress test for evaluation. There has to be no high-risk ischemia and his ejection fraction needs to be higher than 40%. He has been cleared inattentively to go back to work on March 1.

## 2015-09-06 NOTE — Patient Instructions (Addendum)
Medication Instructions:  Your physician recommends that you continue on your current medications as directed. Please refer to the Current Medication list given to you today.   Labwork: none  Testing/Procedures: Your physician has requested that you have a lexiscan myoview. For further information please visit [email or web address]. Please follow instruction sheet, as given.  [place]  Your caregiver has ordered a Stress Test with nuclear imaging. The purpose of this test is to evaluate the blood supply to your heart muscle. This procedure is referred to as a "Non-Invasive Stress Test." This is because other than having an IV started in your vein, nothing is inserted or "invades" your body. Cardiac stress tests are done to find areas of poor blood flow to the heart by determining the extent of coronary artery disease (CAD). Some patients exercise on a treadmill, which naturally increases the blood flow to your heart, while others who are  unable to walk on a treadmill due to physical limitations have a pharmacologic/chemical stress agent called Lexiscan . This medicine will mimic walking on a treadmill by temporarily increasing your coronary blood flow.   Please note: these test may take anywhere between 2-4 hours to complete  PLEASE REPORT TO [facility] AT THE FIRST DESK WILL DIRECT YOU WHERE TO GO  Date of Procedure:_____Monday, Feb 6__________________________  Arrival Time for Procedure:_____7:15am____________________  Instructions regarding medication: You may take your morning medications with a sip of water.     PLEASE NOTIFY THE OFFICE AT LEAST 19 HOURS IN ADVANCE IF YOU ARE UNABLE TO KEEP YOUR APPOINTMENT.  [phone] AND  PLEASE NOTIFY NUCLEAR MEDICINE AT [facility] AT LEAST 24 HOURS IN ADVANCE IF YOU ARE UNABLE TO KEEP YOUR APPOINTMENT. [phone]  How to prepare for your Myoview test:   Do not eat or drink after midnight  No caffeine for  24 hours prior to test  No smoking 24 hours prior to test.  Your medication may be taken with water.  If your doctor stopped a medication because of this test, do not take that medication.  Ladies, please do not wear dresses.  Skirts or pants are appropriate. Please wear a short sleeve shirt.  No perfume, cologne or lotion.  Wear comfortable walking shoes. No heels!            Follow-Up: Your physician wants you to follow-up in: six months with Dr. Fletcher Anon.  You will receive a reminder letter in the mail two months in advance. If you don't receive a letter, please call our office to schedule the follow-up appointment.    Any Other Special Instructions Will Be Listed Below (If Applicable).     If you need a refill on your cardiac medications before your next appointment, please call your pharmacy.  Cardiac Nuclear Scanning A cardiac nuclear scan is used to check your heart for problems, such as the following:  A portion of the heart is not getting enough blood.  Part of the heart muscle has died, which happens with a heart attack.  The heart wall is not working normally.  In this test, a radioactive dye (tracer) is injected into your bloodstream. After the tracer has traveled to your heart, a scanning device is used to measure how much of the tracer is absorbed by or distributed to various areas of your heart. LET [facility] CARE PROVIDER KNOW ABOUT:  Any allergies you have.  All medicines you are taking, including vitamins, herbs, eye drops, creams, and over-the-counter medicines.  Previous problems you or members of your family have had with the use of anesthetics.  Any blood disorders you have.  Previous surgeries you have had.  Medical conditions you have.  RISKS AND COMPLICATIONS Generally, this is a safe procedure. However, as with any procedure, problems can occur. Possible problems include:   Serious chest pain.  Rapid heartbeat.  Sensation of  warmth in your chest. This usually passes quickly. BEFORE THE PROCEDURE Ask your health care provider about changing or stopping your regular medicines. PROCEDURE This procedure is usually done at a hospital and takes 2-4 hours.  An IV tube is inserted into one of your veins.  Your health care provider will inject a small amount of radioactive tracer through the tube.  You will then wait for 20-40 minutes while the tracer travels through your bloodstream.  You will lie down on an exam table so images of your heart can be taken. Images will be taken for about 15-20 minutes.  You will exercise on a treadmill or stationary bike. While you exercise, your heart activity will be monitored with an electrocardiogram (ECG), and your blood pressure will be checked.  If you are unable to exercise, you may be given a medicine to make your heart beat faster.  When blood flow to your heart has peaked, tracer will again be injected through the IV tube.  After 20-40 minutes, you will get back on the exam table and have more images taken of your heart.  When the procedure is over, your IV tube will be removed. AFTER THE PROCEDURE  You will likely be able to leave shortly after the test. Unless your health care provider tells you otherwise, you may return to your normal schedule, including diet, activities, and medicines.  Make sure you find out how and when you will get your test results.   This information is not intended to replace advice given to you by your health care provider. Make sure you discuss any questions you have with your health care provider.   Document Released: 08/18/2004 Document Revised: 07/29/2013 Document Reviewed: 07/02/2013 Elsevier Interactive Patient Education [employer].

## 2015-09-06 NOTE — Assessment & Plan Note (Signed)
Lab Results  Component Value Date   CHOL 170 07/05/2015   HDL 29* 07/05/2015   LDLCALC 82 07/05/2015   TRIG 293* 07/05/2015   CHOLHDL 5.9* 07/05/2015   Continue high dose atorvastatin. His triglyceride was elevated and he was instructed to improve his diet and decrease carbohydrate intake which he has been doing. I suspect that his triglyceride is likely down now. I will plan on repeating this in about 6 months.

## 2015-09-06 NOTE — Assessment & Plan Note (Signed)
Blood pressure is reasonably controlled on lisinopril. He reports that his blood pressure was better actually during cardiac rehabilitation.

## 2015-09-06 NOTE — Progress Notes (Signed)
Primary care physician: Dr. Caryn Section.  HPI  This is a 65 year old man who is here today for a follow-up visit regarding coronary artery disease status post CABG September 2016. He presented then with inferior ST elevation myocardial infarction. Cardiac catheterization showed severe three-vessel coronary artery disease. The culprit was an occluded distal RCA with heavy thrombus burden. Ejection fraction was 35-40%. Aspiration thrombectomy and balloon angioplasty was performed with restoration of TIMI-3 flow. However, the results were overall suboptimal and thus the patient underwent urgent CABG by Dr. Roxan Hockey.   He has been doing very well since then. During the last visit, I discontinued amiodarone and warfarin which was given for postoperative atrial fibrillation. The patient had no recurrent arrhythmia. He attended cardiac rehabilitation with no reported exertional symptoms. He gets brief "twinges" in his chest but he has no exertional symptoms.   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  No Known Allergies   Current Outpatient Prescriptions on File Prior to Visit  Medication Sig Dispense Refill  . aspirin 81 MG EC tablet Take 1 tablet (81 mg total) by mouth daily.    Marland Kitchen atorvastatin (LIPITOR) 80 MG tablet Take 1 tablet (80 mg total) by mouth daily at 6 PM. 90 tablet 3  . clopidogrel (PLAVIX) 75 MG tablet Take 1 tablet (75 mg total) by mouth daily. 90 tablet 3  . ENSURE PLUS (ENSURE PLUS) LIQD Take 237 mLs by mouth daily.    Marland Kitchen lisinopril (PRINIVIL,ZESTRIL) 10 MG tablet Take 1 tablet (10 mg total) by mouth daily. 90 tablet 3  . tamsulosin (FLOMAX) 0.4 MG CAPS capsule Take 1 capsule (0.4 mg total) by mouth daily. 30 capsule 0   No current facility-administered medications on file prior to visit.     Past Medical History  Diagnosis Date  . Kidney stones     a. May 2016.  Marland Kitchen Coronary atherosclerosis     a. CT scan 01/02/2015: extensive coronary  atherosclerosis;  b. 04/2015 Inf STEMI/Cath/CABG x 4: LIMA->LAD, VG->Diag, VG->OM2, VG->RPL.  Marland Kitchen HTN (hypertension)   . HLD (hyperlipidemia)   . Postoperative atrial fibrillation ([place])     a. 04/2015 Post-op CABG AF/AFlutter--> converted with rapid atrial pacing and amio; b. CHA2DS2VASc = 3->Coumadin.  . Complete heart block ([place])     a. 04/2015 in setting of inferior STEMI-->resolved.  . Ischemic cardiomyopathy     a. 04/2015 LV gram: EF 35-40%.     Past Surgical History  Procedure Laterality Date  . Neck surgery    . Hernia repair    . Right hip replacement    . Joint replacement    . Cardiac catheterization N/A 05/03/2015    Procedure: Left Heart Cath and Coronary Angiography;  Surgeon: Wellington Hampshire, MD;  Location: [facility];  Service: Cardiovascular;  Laterality: N/A;  . Coronary artery bypass graft N/A 05/03/2015    Procedure: CORONARY ARTERY BYPASS GRAFTING (CABG) times four using left internal mammary artery and right saphenous vein.;  Surgeon: [name], MD;  Location: [place];  Service: Open Heart Surgery;  Laterality: N/A;  . Tee without cardioversion N/A 05/03/2015    Procedure: TRANSESOPHAGEAL ECHOCARDIOGRAM (TEE);  Surgeon: [name], MD;  Location: [place];  Service: Open Heart Surgery;  Laterality: N/A;     Family History  Problem Relation Age of Onset  . CAD Father   . CAD Maternal Uncle   . CAD Paternal Uncle  Social History   Social History  . Marital Status: Married    Spouse Name: N/A  . Number of Children: N/A  . Years of Education: N/A   Occupational History  . Not on file.   Social History Main Topics  . Smoking status: Never Smoker   . Smokeless tobacco: Not on file  . Alcohol Use: No  . Drug Use: No  . Sexual Activity: Yes    Birth Control/ Protection: None   Other Topics Concern  . Not on file   Social History Narrative      PHYSICAL EXAM   BP 144/82 mmHg  Pulse 60  Ht 5\' 6"  (1.676 m)  Wt 202 lb  12 oz (91.967 kg)  BMI 32.74 kg/m2 Constitutional: He is oriented to person, place, and time. He appears well-developed and well-nourished. No distress.  HENT: No nasal discharge.  Head: Normocephalic and atraumatic.  Eyes: Pupils are equal and round.  No discharge. Neck: Normal range of motion. Neck supple. No JVD present. No thyromegaly present.  Cardiovascular: Normal rate, regular rhythm, normal heart sounds. Exam reveals no gallop and no friction rub. No murmur heard.  Pulmonary/Chest: Effort normal and breath sounds normal. No stridor. No respiratory distress. He has no wheezes. He has no rales. He exhibits no tenderness.  Abdominal: Soft. Bowel sounds are normal. He exhibits no distension. There is no tenderness. There is no rebound and no guarding.  Musculoskeletal: Normal range of motion. He exhibits no edema and no tenderness.  Neurological: He is alert and oriented to person, place, and time. Coordination normal.  Skin: Skin is warm and dry. No rash noted. He is not diaphoretic. No erythema. No pallor.  Psychiatric: He has a normal mood and affect. His behavior is normal. Judgment and thought content normal.       EKG: Normal sinus rhythm with first-degree AV block. Old inferior infarct.   ASSESSMENT AND PLAN

## 2015-09-13 ENCOUNTER — Encounter
Admission: RE | Admit: 2015-09-13 | Discharge: 2015-09-13 | Disposition: A | Discharge status: Home / Self Care | Payer: 59 | Source: Ambulatory Visit | Attending: Cardiovascular Disease | Admitting: Cardiovascular Disease

## 2015-09-13 DIAGNOSIS — R0602 Shortness of breath: Secondary | ICD-10-CM

## 2015-09-13 DIAGNOSIS — I2510 Atherosclerotic heart disease of native coronary artery without angina pectoris: Secondary | ICD-10-CM | POA: Insufficient documentation

## 2015-09-13 HISTORY — DX: Acute myocardial infarction, unspecified: I21.9

## 2015-09-13 HISTORY — DX: Presence of aortocoronary bypass graft: Z95.1

## 2015-09-13 LAB — NM MYOCAR MULTI W/SPECT W/WALL MOTION / EF
CHL CUP NUCLEAR SDS: 1
CHL CUP NUCLEAR SRS: 7
CHL CUP STRESS STAGE 2 HR: 69 {beats}/min
CHL CUP STRESS STAGE 3 GRADE: 0 %
CHL CUP STRESS STAGE 3 HR: 69 {beats}/min
CHL CUP STRESS STAGE 4 DBP: 41 mmHg
CHL CUP STRESS STAGE 4 HR: 96 {beats}/min
CHL CUP STRESS STAGE 4 SBP: 164 mmHg
CHL CUP STRESS STAGE 5 DBP: 49 mmHg
CHL CUP STRESS STAGE 5 SBP: 177 mmHg
CHL CUP STRESS STAGE 6 GRADE: 14 %
CHL CUP STRESS STAGE 6 HR: 131 {beats}/min
CHL CUP STRESS STAGE 6 SBP: 188 mmHg
CHL CUP STRESS STAGE 7 SPEED: 0 mph
CHL CUP STRESS STAGE 8 HR: 76 {beats}/min
CHL CUP STRESS STAGE 8 SPEED: 0 mph
CSEPEW: 10.1 METS
CSEPPBP: 188 mmHg
CSEPPHR: 131 {beats}/min
CSEPPMHR: 85 %
Exercise duration (min): 9 min
LV sys vol: 21 mL
LVDIAVOL: 67 mL
Percent HR: 85 %
Rest HR: 63 {beats}/min
SSS: 6
Stage 1 HR: 65 {beats}/min
Stage 2 Grade: 0 %
Stage 2 Speed: 1 mph
Stage 3 Speed: 1 mph
Stage 4 Grade: 10 %
Stage 4 Speed: 1.7 mph
Stage 5 Grade: 12 %
Stage 5 HR: 108 {beats}/min
Stage 5 Speed: 2.5 mph
Stage 6 DBP: 56 mmHg
Stage 6 Speed: 3.4 mph
Stage 7 Grade: 0 %
Stage 7 HR: 113 {beats}/min
Stage 8 DBP: 74 mmHg
Stage 8 Grade: 0 %
Stage 8 SBP: 151 mmHg
TID: 0.9

## 2015-09-13 MED ORDER — TECHNETIUM TC 99M SESTAMIBI - CARDIOLITE
13.9580 | Freq: Once | INTRAVENOUS | Status: AC | PRN
  Administered 2015-09-13: 13.958 via INTRAVENOUS

## 2015-09-13 MED ORDER — TECHNETIUM TC 99M SESTAMIBI - CARDIOLITE
31.1890 | Freq: Once | INTRAVENOUS | Status: AC | PRN
  Administered 2015-09-13: 31.189 via INTRAVENOUS

## 2015-09-14 DIAGNOSIS — N200 Calculus of kidney: Secondary | ICD-10-CM | POA: Insufficient documentation

## 2015-09-14 DIAGNOSIS — I440 Atrioventricular block, first degree: Secondary | ICD-10-CM | POA: Insufficient documentation

## 2015-09-14 DIAGNOSIS — M5431 Sciatica, right side: Secondary | ICD-10-CM | POA: Insufficient documentation

## 2015-09-14 DIAGNOSIS — Z8249 Family history of ischemic heart disease and other diseases of the circulatory system: Secondary | ICD-10-CM | POA: Insufficient documentation

## 2015-09-14 DIAGNOSIS — R42 Dizziness and giddiness: Secondary | ICD-10-CM | POA: Insufficient documentation

## 2015-09-15 ENCOUNTER — Ambulatory Visit (INDEPENDENT_AMBULATORY_CARE_PROVIDER_SITE_OTHER): Payer: 59 | Admitting: Family Medicine

## 2015-09-15 ENCOUNTER — Encounter: Payer: Self-pay | Admitting: Family Medicine

## 2015-09-15 VITALS — BP 116/70 | HR 77 | Temp 98.7°F | Resp 16 | Ht 67.0 in | Wt 201.0 lb

## 2015-09-15 DIAGNOSIS — I25118 Atherosclerotic heart disease of native coronary artery with other forms of angina pectoris: Secondary | ICD-10-CM

## 2015-09-15 DIAGNOSIS — E785 Hyperlipidemia, unspecified: Secondary | ICD-10-CM

## 2015-09-15 DIAGNOSIS — Z0000 Encounter for general adult medical examination without abnormal findings: Secondary | ICD-10-CM | POA: Diagnosis not present

## 2015-09-15 DIAGNOSIS — Z86010 Personal history of colonic polyps: Secondary | ICD-10-CM | POA: Diagnosis not present

## 2015-09-15 DIAGNOSIS — J069 Acute upper respiratory infection, unspecified: Secondary | ICD-10-CM

## 2015-09-15 DIAGNOSIS — Z860101 Personal history of adenomatous and serrated colon polyps: Secondary | ICD-10-CM | POA: Insufficient documentation

## 2015-09-15 DIAGNOSIS — I10 Essential (primary) hypertension: Secondary | ICD-10-CM

## 2015-09-15 DIAGNOSIS — Z23 Encounter for immunization: Secondary | ICD-10-CM

## 2015-09-15 NOTE — Progress Notes (Signed)
Patient: [name], Male    DOB: [birth year], 65 y.o.   MRN: [id number] Visit Date: 09/15/2015  Today's Provider: [name], MD   Chief Complaint  Patient presents with  . Annual Exam  . Hyperlipidemia  . Hypertension   Subjective:    Annual wellness visit [name] is a 65 y.o. male. He feels well. He reports exercising/yes, walking. He reports he is sleeping fairly well.  -----------------------------------------------------------   Follow-up for elevated blood sugar from 12/31/2014; recommended pre-diabetes class at [facility]. Recheck blood sugars in 3 months.      Hypertension, follow-up:  BP Readings from Last 3 Encounters:  09/15/15 116/70  09/06/15 144/82  07/05/15 150/80    He was last seen for hypertension 0 no proior visit ago.  BP at that visit was n/a. Management since that visit includes; n/a.He reports excellent compliance with treatment. He is not having side effects. none  He is exercising. He is adherent to low salt diet.   Outside blood pressures are normal. He is experiencing none.  Patient denies none.   Cardiovascular risk factors include none.  Use of agents associated with hypertension: none.   ----------------------------------------------------------------------    Lipid/Cholesterol, Follow-up:   Last seen for this 9 months ago.  Management since that visit includes; no chnages.  Last Lipid Panel:    Component Value Date/Time   CHOL 170 07/05/2015 1143   CHOL 230* 05/03/2015 0921   TRIG 293* 07/05/2015 1143   HDL 29* 07/05/2015 1143   HDL 30* 05/03/2015 0921   CHOLHDL 5.9* 07/05/2015 1143   CHOLHDL 7.7 05/03/2015 0921   VLDL 32 05/03/2015 0921   LDLCALC 82 07/05/2015 1143   LDLCALC 168* 05/03/2015 0921    He reports good compliance with treatment. He is not having side effects. none  Wt Readings from Last 3 Encounters:  09/15/15 201 lb (91.173 kg)  09/06/15 202 lb 12 oz (91.967 kg)  08/06/15 201 lb  1.6 oz (91.218 kg)    ----------------------------------------------------------------------      Review of Systems  Constitutional: Negative.   HENT: Positive for congestion, sore throat and trouble swallowing.        Since Monday 09/13/2015  Eyes: Negative.   Respiratory: Positive for cough, shortness of breath and wheezing.   Cardiovascular: Negative.   Gastrointestinal: Negative.   Endocrine: Negative.   Genitourinary: Positive for frequency.  Musculoskeletal: Negative.   Skin: Negative.   Allergic/Immunologic: Negative.   Neurological: Negative.   Hematological: Negative.   Psychiatric/Behavioral: Negative.     Social History   Social History  . Marital Status: Married    Spouse Name: N/A  . Number of Children: 1  . Years of Education: N/A   Occupational History  . Truck Geophysicist/field seismologist    Social History Main Topics  . Smoking status: Never Smoker   . Smokeless tobacco: Not on file  . Alcohol Use: 0.0 oz/week    0 Standard drinks or equivalent per week     Comment: occasional use  . Drug Use: No  . Sexual Activity: Yes    Birth Control/ Protection: None   Other Topics Concern  . Not on file   Social History Narrative    Past Medical History  Diagnosis Date  . Kidney stones     a. May 2016.  Marland Kitchen Coronary atherosclerosis     a. CT scan 01/02/2015: extensive coronary atherosclerosis;  b. 04/2015 Inf STEMI/Cath/CABG x 4: LIMA->LAD, VG->Diag, VG->OM2, VG->RPL.  Marland Kitchen  HTN (hypertension)   . HLD (hyperlipidemia)   . Postoperative atrial fibrillation ([place])     a. 04/2015 Post-op CABG AF/AFlutter--> converted with rapid atrial pacing and amio; b. CHA2DS2VASc = 3->Coumadin.  . Complete heart block ([place])     a. 04/2015 in setting of inferior STEMI-->resolved.  . Ischemic cardiomyopathy     a. 04/2015 LV gram: EF 35-40%.  . 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 MI (myocardial infarction) ([place]) 04/2015  . Colon polyps Tubular adenoma excised 2012     Patient Active Problem List    Diagnosis Date Noted  . 1St degree AV block 09/14/2015  . Sciatica of right side 09/14/2015  . Bilateral kidney stones 09/14/2015  . Fam hx-ischem heart disease 09/14/2015  . Episodic lightheadedness 09/14/2015  . Complete heart block ([place])   . Postoperative atrial fibrillation ([place])   . HLD (hyperlipidemia)   . HTN (hypertension)   . Coronary atherosclerosis   . ST elevation myocardial infarction (STEMI) of inferior wall ([place]) 05/03/2015  . CAD (coronary artery disease) 05/03/2015  . S/P CABG x 4 05/03/2015    Past Surgical History  Procedure Laterality Date  . Neck surgery      Spinal fusion- neck. Dr. Sherley [facility] of [facility])  . Right hip replacement Right 02/04/2014    Dr. Sabra Heck  . Joint replacement    . Cardiac catheterization N/A 05/03/2015    Procedure: Left Heart Cath and Coronary Angiography;  Surgeon: Wellington Hampshire, MD;  Location: [place] [facility];  Service: Cardiovascular;  Laterality: N/A;  . Coronary artery bypass graft N/A 05/03/2015    Procedure: CORONARY ARTERY BYPASS GRAFTING (CABG) times four using left internal mammary artery and right saphenous vein.;  Surgeon: [name], MD;  Location: [place];  Service: Open Heart Surgery;  Laterality: N/A;  . Tee without cardioversion N/A 05/03/2015    Procedure: TRANSESOPHAGEAL ECHOCARDIOGRAM (TEE);  Surgeon: [name], MD;  Location: [place];  Service: Open Heart Surgery;  Laterality: N/A;  . Knee surgery      arthroscopic knee surgery  . Hernia repair      ventral hernia repair    His family history includes CAD in his father, maternal uncle, and paternal uncle; Heart Problems in his sister; Heart attack in his brother, brother, brother, brother, father, and sister.    Previous Medications   ASPIRIN 81 MG EC TABLET    Take 1 tablet (81 mg total) by mouth daily.   ATORVASTATIN (LIPITOR) 80 MG TABLET    Take 1 tablet (80 mg total) by mouth daily at 6 PM.   CLOPIDOGREL  (PLAVIX) 75 MG TABLET    Take 1 tablet (75 mg total) by mouth daily.   ENSURE PLUS (ENSURE PLUS) LIQD    Take 237 mLs by mouth daily.   LISINOPRIL (PRINIVIL,ZESTRIL) 10 MG TABLET    Take 1 tablet (10 mg total) by mouth daily.   MULTIPLE VITAMIN (MULTIVITAMIN) TABLET    Take 1 tablet by mouth daily.   TAMSULOSIN (FLOMAX) 0.4 MG CAPS CAPSULE    Take 1 capsule (0.4 mg total) by mouth daily.    Patient Care Team: Birdie Sons, MD as PCP - General (Family Medicine) Casilda Carls, MD as Consulting Physician (Internal Medicine) Wellington Hampshire, MD as Consulting Physician (Cardiology)     Objective:   Vitals: BP 116/70 mmHg  Pulse 77  Temp(Src) 98.7 F (37.1 C) (Oral)  Resp 16  Ht 5\' 7"  (1.702  m)  Wt 201 lb (91.173 kg)  BMI 31.47 kg/m2  SpO2 97%  Physical Exam   General Appearance:    Alert, cooperative, no distress, appears stated age  Head:    Normocephalic, without obvious abnormality, atraumatic  Eyes:    PERRL, conjunctiva/corneas clear, EOM's intact, fundi    benign, both eyes       Ears:    Normal TM's and external ear canals, both ears  Nose:   Nares normal, septum midline, mucosa normal, no drainage   or sinus tenderness  Throat:   Lips, mucosa, and tongue normal; teeth and gums normal  Neck:   Supple, symmetrical, trachea midline, no adenopathy;       thyroid:  No enlargement/tenderness/nodules; no carotid   bruit or JVD  Back:     Symmetric, no curvature, ROM normal, no CVA tenderness  Lungs:     Clear to auscultation bilaterally, respirations unlabored  Chest wall:    No tenderness or deformity  Heart:    Regular rate and rhythm, S1 and S2 normal, no murmur, rub   or gallop  Abdomen:     Soft, non-tender, bowel sounds active all four quadrants,    no masses, no organomegaly  Genitalia:    deferred  Rectal:    deferred  Extremities:   Extremities normal, atraumatic, no cyanosis or edema  Pulses:   2+ and symmetric all extremities  Skin:   Skin color, texture,  turgor normal, no rashes or lesions  Lymph nodes:   Cervical, supraclavicular, and axillary nodes normal  Neurologic:   CNII-XII intact. Normal strength, sensation and reflexes      throughout    Activities of Daily Living In your present state of health, do you have any difficulty performing the following activities: 09/15/2015 05/04/2015  Hearing? N N  Vision? N N  Difficulty concentrating or making decisions? N N  Walking or climbing stairs? N Y  Dressing or bathing? N N  Doing errands, shopping? N N    Fall Risk Assessment Fall Risk  09/15/2015 06/01/2015  Falls in the past year? No No     Depression Screen PHQ 2/9 Scores 09/15/2015 07/26/2015 06/01/2015  PHQ - 2 Score 0 0 0  PHQ- 9 Score - 0 4    Audit-C Alcohol Use Screening  Question Answer Points  How often do you have alcoholic drink? never 0  On days you do drink alcohol, how many drinks do you typically consume? n/a 0  How oftey will you drink 6 or more in a total? never 0  Total Score:  0   A score of 3 or more in women, and 4 or more in men indicates increased risk for alcohol abuse, EXCEPT if all of the points are from question 1.      Assessment & Plan:     Annual Wellness Visit  Reviewed patient's Family Medical History Reviewed and updated list of patient's medical providers Assessment of cognitive impairment was done Assessed patient's functional ability Established a written schedule for health screening [place] Completed and Reviewed  Exercise Activities and Dietary recommendations Goals    None      Immunization History  Administered Date(s) Administered  . Influenza-Unspecified 05/07/2015  . Tdap 06/19/2008    Health Maintenance  Topic Date Due  . COLONOSCOPY  01/13/2016  . INFLUENZA VACCINE  03/07/2016  . PNA vac Low Risk Adult (2 of 2 - PPSV23) 09/14/2016  . TETANUS/TDAP  06/19/2018  .  ZOSTAVAX  Completed  . Hepatitis C Screening  Completed        Discussed health benefits of physical activity, and encouraged him to engage in regular exercise appropriate for his age and condition.    --------------------------------------------------------------------------------  1. Annual physical exam  - PSA - Ambulatory referral to Gastroenterology  2. Coronary artery disease involving native coronary artery of native heart with other form of angina pectoris ([place]) Asymptomatic. Compliant with medication.  Continue aggressive risk factor modification.    3. HLD (hyperlipidemia) He is tolerating atorvastatin well with no adverse effects.    4. Essential hypertension Well controlled.  Continue current medications.   - Renal function panel  5. History of adenomatous polyp of colon  - Ambulatory referral to Gastroenterology  6. Need for pneumococcal vaccination  - Pneumococcal conjugate vaccine 13-valent IM  7. Upper respiratory infection Resolving. Counseled regarding signs and symptoms of viral and bacterial respiratory infections. Advised to call or return for additional evaluation if he develops any sign of bacterial infection, or if current symptoms last longer than 10 days.

## 2015-09-15 NOTE — Patient Instructions (Signed)
Upper Respiratory Infection, Adult Most upper respiratory infections (URIs) are a viral infection of the air passages leading to the lungs. A URI affects the nose, throat, and upper air passages. The most common type of URI is nasopharyngitis and is typically referred to as "the common cold." URIs run their course and usually go away on their own. Most of the time, a URI does not require medical attention, but sometimes a bacterial infection in the upper airways can follow a viral infection. This is called a secondary infection. Sinus and middle ear infections are common types of secondary upper respiratory infections. Bacterial pneumonia can also complicate a URI. A URI can worsen asthma and chronic obstructive pulmonary disease (COPD). Sometimes, these complications can require emergency medical care and may be life threatening.  CAUSES Almost all URIs are caused by viruses. A virus is a type of germ and can spread from one person to another.  RISKS FACTORS You may be at risk for a URI if:   You smoke.   You have chronic heart or lung disease.  You have a weakened defense (immune) system.   You are very young or very old.   You have nasal allergies or asthma.  You work in crowded or poorly ventilated areas.  You work in health care facilities or schools. SIGNS AND SYMPTOMS  Symptoms typically develop 2-3 days after you come in contact with a cold virus. Most viral URIs last 7-10 days. However, viral URIs from the influenza virus (flu virus) can last 14-18 days and are typically more severe. Symptoms may include:   Runny or stuffy (congested) nose.   Sneezing.   Cough.   Sore throat.   Headache.   Fatigue.   Fever.   Loss of appetite.   Pain in your forehead, behind your eyes, and over your cheekbones (sinus pain).  Muscle aches.  DIAGNOSIS  Your health care provider may diagnose a URI by:  Physical exam.  Tests to check that your symptoms are not due to  another condition such as:  Strep throat.  Sinusitis.  Pneumonia.  Asthma. TREATMENT  A URI goes away on its own with time. It cannot be cured with medicines, but medicines may be prescribed or recommended to relieve symptoms. Medicines may help:  Reduce your fever.  Reduce your cough.  Relieve nasal congestion. HOME CARE INSTRUCTIONS   Take medicines only as directed by your health care provider.   Gargle warm saltwater or take cough drops to comfort your throat as directed by your health care provider.  Use a warm mist humidifier or inhale steam from a shower to increase air moisture. This may make it easier to breathe.  Drink enough fluid to keep your urine clear or pale yellow.   Eat soups and other clear broths and maintain good nutrition.   Rest as needed.   Return to work when your temperature has returned to normal or as your health care provider advises. You may need to stay home longer to avoid infecting others. You can also use a face mask and careful hand washing to prevent spread of the virus.  Increase the usage of your inhaler if you have asthma.   Do not use any tobacco products, including cigarettes, chewing tobacco, or electronic cigarettes. If you need help quitting, ask your health care provider. PREVENTION  The best way to protect yourself from getting a cold is to practice good hygiene.   Avoid oral or hand contact with people with cold   symptoms.   Wash your hands often if contact occurs.  There is no clear evidence that vitamin C, vitamin E, echinacea, or exercise reduces the chance of developing a cold. However, it is always recommended to get plenty of rest, exercise, and practice good nutrition.  SEEK MEDICAL CARE IF:   You are getting worse rather than better.   Your symptoms are not controlled by medicine.   You have chills.  You have worsening shortness of breath.  You have brown or red mucus.  You have yellow or brown nasal  discharge.  You have pain in your face, especially when you bend forward.  You have a fever.  You have swollen neck glands.  You have pain while swallowing.  You have white areas in the back of your throat. SEEK IMMEDIATE MEDICAL CARE IF:   You have severe or persistent:  Headache.  Ear pain.  Sinus pain.  Chest pain.  You have chronic lung disease and any of the following:  Wheezing.  Prolonged cough.  Coughing up blood.  A change in your usual mucus.  You have a stiff neck.  You have changes in your:  Vision.  Hearing.  Thinking.  Mood. MAKE SURE YOU:   Understand these instructions.  Will watch your condition.  Will get help right away if you are not doing well or get worse.   This information is not intended to replace advice given to you by your health care provider. Make sure you discuss any questions you have with your health care provider.   Document Released: 01/17/2001 Document Revised: 12/08/2014 Document Reviewed: 10/29/2013 Elsevier Interactive Patient Education 2016 Elsevier Inc.  

## 2015-09-16 LAB — RENAL FUNCTION PANEL
Albumin: 4.4 g/dL (ref 3.6–4.8)
BUN / CREAT RATIO: 17 (ref 10–22)
BUN: 16 mg/dL (ref 8–27)
CALCIUM: 9.3 mg/dL (ref 8.6–10.2)
CO2: 23 mmol/L (ref 18–29)
CREATININE: 0.94 mg/dL (ref 0.76–1.27)
Chloride: 103 mmol/L (ref 96–106)
GFR calc Af Amer: 97 mL/min/{1.73_m2} (ref >59)
GFR calc non Af Amer: 84 mL/min/{1.73_m2} (ref >59)
GLUCOSE: 104 mg/dL — AB (ref 65–99)
PHOSPHORUS: 3.1 mg/dL (ref 2.5–4.5)
POTASSIUM: 4.5 mmol/L (ref 3.5–5.2)
SODIUM: 141 mmol/L (ref 134–144)

## 2015-09-16 LAB — PSA: PROSTATE SPECIFIC AG, SERUM: 0.8 ng/mL (ref 0.0–4.0)

## 2015-11-18 ENCOUNTER — Other Ambulatory Visit: Payer: Self-pay

## 2015-11-18 DIAGNOSIS — Z86010 Personal history of colonic polyps: Secondary | ICD-10-CM | POA: Diagnosis not present

## 2015-11-22 ENCOUNTER — Telehealth: Payer: Self-pay | Admitting: Cardiovascular Disease

## 2015-11-22 NOTE — Telephone Encounter (Signed)
Faxed cardiac clearance for 6/16 colonscopy to [facility], [phone]

## 2015-11-24 ENCOUNTER — Ambulatory Visit: Payer: 59 | Admitting: Nurse Practitioner

## 2016-01-21 ENCOUNTER — Encounter
Admission: RE | Disposition: A | Discharge status: Home / Self Care | Payer: Self-pay | Source: Ambulatory Visit | Attending: Gastroenterology

## 2016-01-21 ENCOUNTER — Ambulatory Visit
Admission: RE | Admit: 2016-01-21 | Discharge: 2016-01-21 | Disposition: A | Discharge status: Home / Self Care | Payer: 59 | Source: Ambulatory Visit | Attending: Gastroenterology | Admitting: Gastroenterology

## 2016-01-21 SURGERY — COLONOSCOPY WITH PROPOFOL
Anesthesia: General

## 2016-01-21 MED ORDER — LIDOCAINE HCL (PF) 1 % IJ SOLN: 2.0000 mL | Freq: Once | INTRAMUSCULAR | Status: DC

## 2016-01-21 MED ORDER — SODIUM CHLORIDE 0.9 % IV SOLN: INTRAVENOUS | Status: DC

## 2016-01-21 MED ORDER — AMPICILLIN SODIUM 2 G IJ SOLR
2.0000 g | Freq: Once | INTRAMUSCULAR | Status: DC
  Filled 2016-01-21: qty 2000

## 2016-02-04 DIAGNOSIS — K5900 Constipation, unspecified: Secondary | ICD-10-CM | POA: Diagnosis not present

## 2016-02-04 DIAGNOSIS — N200 Calculus of kidney: Secondary | ICD-10-CM | POA: Diagnosis not present

## 2016-02-04 DIAGNOSIS — N2889 Other specified disorders of kidney and ureter: Secondary | ICD-10-CM | POA: Diagnosis not present

## 2016-02-04 DIAGNOSIS — N411 Chronic prostatitis: Secondary | ICD-10-CM | POA: Diagnosis not present

## 2016-02-04 DIAGNOSIS — N401 Enlarged prostate with lower urinary tract symptoms: Secondary | ICD-10-CM | POA: Diagnosis not present

## 2016-05-20 ENCOUNTER — Other Ambulatory Visit: Payer: Self-pay | Admitting: Nurse Practitioner

## 2016-06-16 ENCOUNTER — Ambulatory Visit (INDEPENDENT_AMBULATORY_CARE_PROVIDER_SITE_OTHER): Payer: Medicare Other | Admitting: Cardiovascular Disease

## 2016-06-16 ENCOUNTER — Encounter: Payer: Self-pay | Admitting: Cardiovascular Disease

## 2016-06-16 VITALS — BP 148/70 | HR 72 | Ht 66.0 in | Wt 204.5 lb

## 2016-06-16 DIAGNOSIS — I10 Essential (primary) hypertension: Secondary | ICD-10-CM | POA: Diagnosis not present

## 2016-06-16 DIAGNOSIS — E782 Mixed hyperlipidemia: Secondary | ICD-10-CM | POA: Diagnosis not present

## 2016-06-16 DIAGNOSIS — I209 Angina pectoris, unspecified: Secondary | ICD-10-CM

## 2016-06-16 DIAGNOSIS — I440 Atrioventricular block, first degree: Secondary | ICD-10-CM

## 2016-06-16 DIAGNOSIS — I25118 Atherosclerotic heart disease of native coronary artery with other forms of angina pectoris: Secondary | ICD-10-CM

## 2016-06-16 MED ORDER — LISINOPRIL 20 MG PO TABS: 20.0000 mg | ORAL_TABLET | Freq: Every day | ORAL | 3 refills | Status: DC

## 2016-06-16 NOTE — Progress Notes (Signed)
Cardiology Office Note   Date:  06/16/2016   ID:  ANGELL GAINOUS, DOB Aug 12, 1950, MRN [id number]  PCP:  [name], MD  Cardiologist:   Kathlyn Sacramento, MD   Chief Complaint  Patient presents with  . other    6 month follow up. Meds reviewed by the pt. verbally. Pt. c/o twinges in chest at times & has a cold.       History of Present Illness: [name] is a 65 y.o. male who presents for a follow-up visit regarding coronary artery disease status post CABG September 2016. He presented then with inferior ST elevation myocardial infarction. Cardiac catheterization showed severe three-vessel coronary artery disease. The culprit was an occluded distal RCA with heavy thrombus burden. Ejection fraction was 35-40%. Aspiration thrombectomy and balloon angioplasty was performed with restoration of TIMI-3 flow. However, the results were overall suboptimal and thus the patient underwent urgent CABG by Dr. Roxan Hockey.  The patient works as a Administrator. He underwent a treadmill stress test in February 2017 which showed evidence of prior inferior infarct without significant ischemia. Ejection fraction was normal with excellent exercise capacity. He has been doing well with no chest pain or shortness of breath.  Past Medical History:  Diagnosis Date  . Colon polyps   . Complete heart block ([place])    a. 04/2015 in setting of inferior STEMI-->resolved.  . Coronary atherosclerosis    a. CT scan 01/02/2015: extensive coronary atherosclerosis;  b. 04/2015 Inf STEMI/Cath/CABG x 4: LIMA->LAD, VG->Diag, VG->OM2, VG->RPL.  Marland Kitchen HLD (hyperlipidemia)   . HTN (hypertension)   . Ischemic cardiomyopathy    a. 04/2015 LV gram: EF 35-40%.  . Kidney stones    a. May 2016.  . MI (myocardial infarction) 04/2015  . Postoperative atrial fibrillation ([place])    a. 04/2015 Post-op CABG AF/AFlutter--> converted with rapid atrial pacing and amio; b. CHA2DS2VASc = 3->Coumadin.  . 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 CARDIAC CATHETERIZATION N/A 05/03/2015   Procedure: Left Heart Cath and Coronary Angiography;  Surgeon: Wellington Hampshire, MD;  Location: [place] [facility];  Service: Cardiovascular;  Laterality: N/A;  . CORONARY ARTERY BYPASS GRAFT N/A 05/03/2015   Procedure: CORONARY ARTERY BYPASS GRAFTING (CABG) times four using left internal mammary artery and right saphenous vein.;  Surgeon: [name], MD;  Location: [place];  Service: Open Heart Surgery;  Laterality: N/A;  . HERNIA REPAIR     ventral hernia repair  . JOINT REPLACEMENT    . KNEE SURGERY     arthroscopic knee surgery  . NECK SURGERY     Spinal fusion- neck. Dr. Sherley Bounds of [facility])  . right hip replacement Right 02/04/2014   Dr. Sabra Heck  . TEE WITHOUT CARDIOVERSION N/A 05/03/2015   Procedure: TRANSESOPHAGEAL ECHOCARDIOGRAM (TEE);  Surgeon: [name], MD;  Location: [place];  Service: Open Heart Surgery;  Laterality: N/A;     Current Outpatient Prescriptions  Medication Sig Dispense Refill  . aspirin 81 MG EC tablet Take 1 tablet (81 mg total) by mouth daily.    Marland Kitchen atorvastatin (LIPITOR) 80 MG tablet TAKE ONE TABLET BY MOUTH ONCE DAILY AT 6PM 30 tablet 3  . clopidogrel (PLAVIX) 75 MG tablet Take 1 tablet (75 mg total) by mouth daily. 90 tablet 3  . ENSURE PLUS (ENSURE PLUS) LIQD Take 237 mLs by mouth daily.    Marland Kitchen lisinopril (PRINIVIL,ZESTRIL) 20 MG  tablet Take 1 tablet (20 mg total) by mouth daily. 90 tablet 3  . Multiple Vitamin (MULTIVITAMIN) tablet Take 1 tablet by mouth daily.    . tamsulosin (FLOMAX) 0.4 MG CAPS capsule Take 1 capsule (0.4 mg total) by mouth daily. 30 capsule 0   No current facility-administered medications for this visit.     Allergies:   Lovastatin and Pravastatin    Social History:  The patient  reports that he has never smoked. He has never used smokeless tobacco. He reports that he drinks alcohol. He reports  that he does not use drugs.   Family History:  The patient's family history includes CAD in his father, maternal uncle, and paternal uncle; Heart Problems in his sister; Heart attack in his brother, brother, brother, brother, father, and sister.    ROS:  Please see the history of present illness.   Otherwise, review of systems are positive for none.   All other systems are reviewed and negative.    PHYSICAL EXAM: VS:  BP (!) 148/70 (BP Location: Left Arm, Patient Position: Sitting, Cuff Size: Normal)   Pulse 72   Ht 5\' 6"  (1.676 m)   Wt 204 lb 8 oz (92.8 kg)   BMI 33.01 kg/m  , BMI Body mass index is 33.01 kg/m. GEN: Well nourished, well developed, in no acute distress  HEENT: normal  Neck: no JVD, carotid bruits, or masses Cardiac: RRR; no murmurs, rubs, or gallops,no edema  Respiratory:  clear to auscultation bilaterally, normal work of breathing GI: soft, nontender, nondistended, + BS MS: no deformity or atrophy  Skin: warm and dry, no rash Neuro:  Strength and sensation are intact Psych: euthymic mood, full affect   EKG:  EKG is ordered today. The ekg ordered today demonstrates normal sinus rhythm with first-degree AV block. Old inferior infarct.   Recent Labs: 07/05/2015: ALT 28 09/15/2015: BUN 16; Creatinine, Ser 0.94; Potassium 4.5; Sodium 141    Lipid Panel    Component Value Date/Time   CHOL 170 07/05/2015 1143   TRIG 293 (H) 07/05/2015 1143   HDL 29 (L) 07/05/2015 1143   CHOLHDL 5.9 (H) 07/05/2015 1143   CHOLHDL 7.7 05/03/2015 0921   VLDL 32 05/03/2015 0921   LDLCALC 82 07/05/2015 1143      Wt Readings from Last 3 Encounters:  06/16/16 204 lb 8 oz (92.8 kg)  01/21/16 200 lb (90.7 kg)  09/15/15 201 lb (91.2 kg)      No flowsheet data found.    ASSESSMENT AND PLAN:  1.  Coronary artery disease involving native coronary arteries without angina: He is doing extremely well with no anginal symptoms. Continue medical therapy.  2. Hyperlipidemia:  Continue high dose atorvastatin. I requested fasting lipid and liver profile.   3. Essential hypertension: Blood pressure continues to be mildly elevated. I increased lisinopril to 20 mg daily. Check basic metabolic profile in one week.    Disposition:   FU with me in 9 months  Signed,  Kathlyn Sacramento, MD  06/16/2016 4:32 PM    [facility]

## 2016-06-16 NOTE — Patient Instructions (Addendum)
Medication Instructions:  Your physician has recommended you make the following change in your medication:  INCREASE lisinopril to 20mg  once daily   Labwork: Fasting lipid and liver profile and BMET in one week. Nothing to eat or drink after midnight the evening before your labs.   Testing/Procedures: none  Follow-Up: Your physician wants you to follow-up in: 9 months. You will receive a reminder letter in the mail two months in advance. If you don't receive a letter, please call our office to schedule the follow-up appointment.   Any Other Special Instructions Will Be Listed Below (If Applicable).     If you need a refill on your cardiac medications before your next appointment, please call your pharmacy.

## 2016-06-23 ENCOUNTER — Other Ambulatory Visit (INDEPENDENT_AMBULATORY_CARE_PROVIDER_SITE_OTHER): Payer: Medicare Other | Admitting: *Deleted

## 2016-06-23 DIAGNOSIS — I10 Essential (primary) hypertension: Secondary | ICD-10-CM | POA: Diagnosis not present

## 2016-06-23 DIAGNOSIS — I25118 Atherosclerotic heart disease of native coronary artery with other forms of angina pectoris: Secondary | ICD-10-CM

## 2016-06-24 LAB — LIPID PANEL
CHOL/HDL RATIO: 5.1 ratio — AB (ref 0.0–5.0)
CHOLESTEROL TOTAL: 128 mg/dL (ref 100–199)
HDL: 25 mg/dL — ABNORMAL LOW (ref >39)
LDL Calculated: 46 mg/dL (ref 0–99)
TRIGLYCERIDES: 286 mg/dL — AB (ref 0–149)
VLDL Cholesterol Cal: 57 mg/dL — ABNORMAL HIGH (ref 5–40)

## 2016-06-24 LAB — BASIC METABOLIC PANEL
BUN / CREAT RATIO: 13 (ref 10–24)
BUN: 14 mg/dL (ref 8–27)
CHLORIDE: 103 mmol/L (ref 96–106)
CO2: 25 mmol/L (ref 18–29)
Calcium: 8.8 mg/dL (ref 8.6–10.2)
Creatinine, Ser: 1.08 mg/dL (ref 0.76–1.27)
GFR calc non Af Amer: 71 mL/min/{1.73_m2} (ref >59)
GFR, EST AFRICAN AMERICAN: 82 mL/min/{1.73_m2} (ref >59)
Glucose: 122 mg/dL — ABNORMAL HIGH (ref 65–99)
Potassium: 4.4 mmol/L (ref 3.5–5.2)
Sodium: 142 mmol/L (ref 134–144)

## 2016-06-24 LAB — HEPATIC FUNCTION PANEL
ALT: 24 IU/L (ref 0–44)
AST: 23 IU/L (ref 0–40)
Albumin: 3.9 g/dL (ref 3.6–4.8)
Alkaline Phosphatase: 101 IU/L (ref 39–117)
BILIRUBIN TOTAL: 0.9 mg/dL (ref 0.0–1.2)
BILIRUBIN, DIRECT: 0.26 mg/dL (ref 0.00–0.40)
Total Protein: 6.1 g/dL (ref 6.0–8.5)

## 2016-07-07 ENCOUNTER — Other Ambulatory Visit: Payer: Self-pay | Admitting: Cardiovascular Disease

## 2016-07-07 IMAGING — DX DG CHEST 1V PORT
1 series · 1 of 1 positions shown · non-contrast
Comparison: 05/04/2015

CLINICAL DATA: Post CABG

EXAM:
PORTABLE CHEST 1 VIEW

[chest ap]
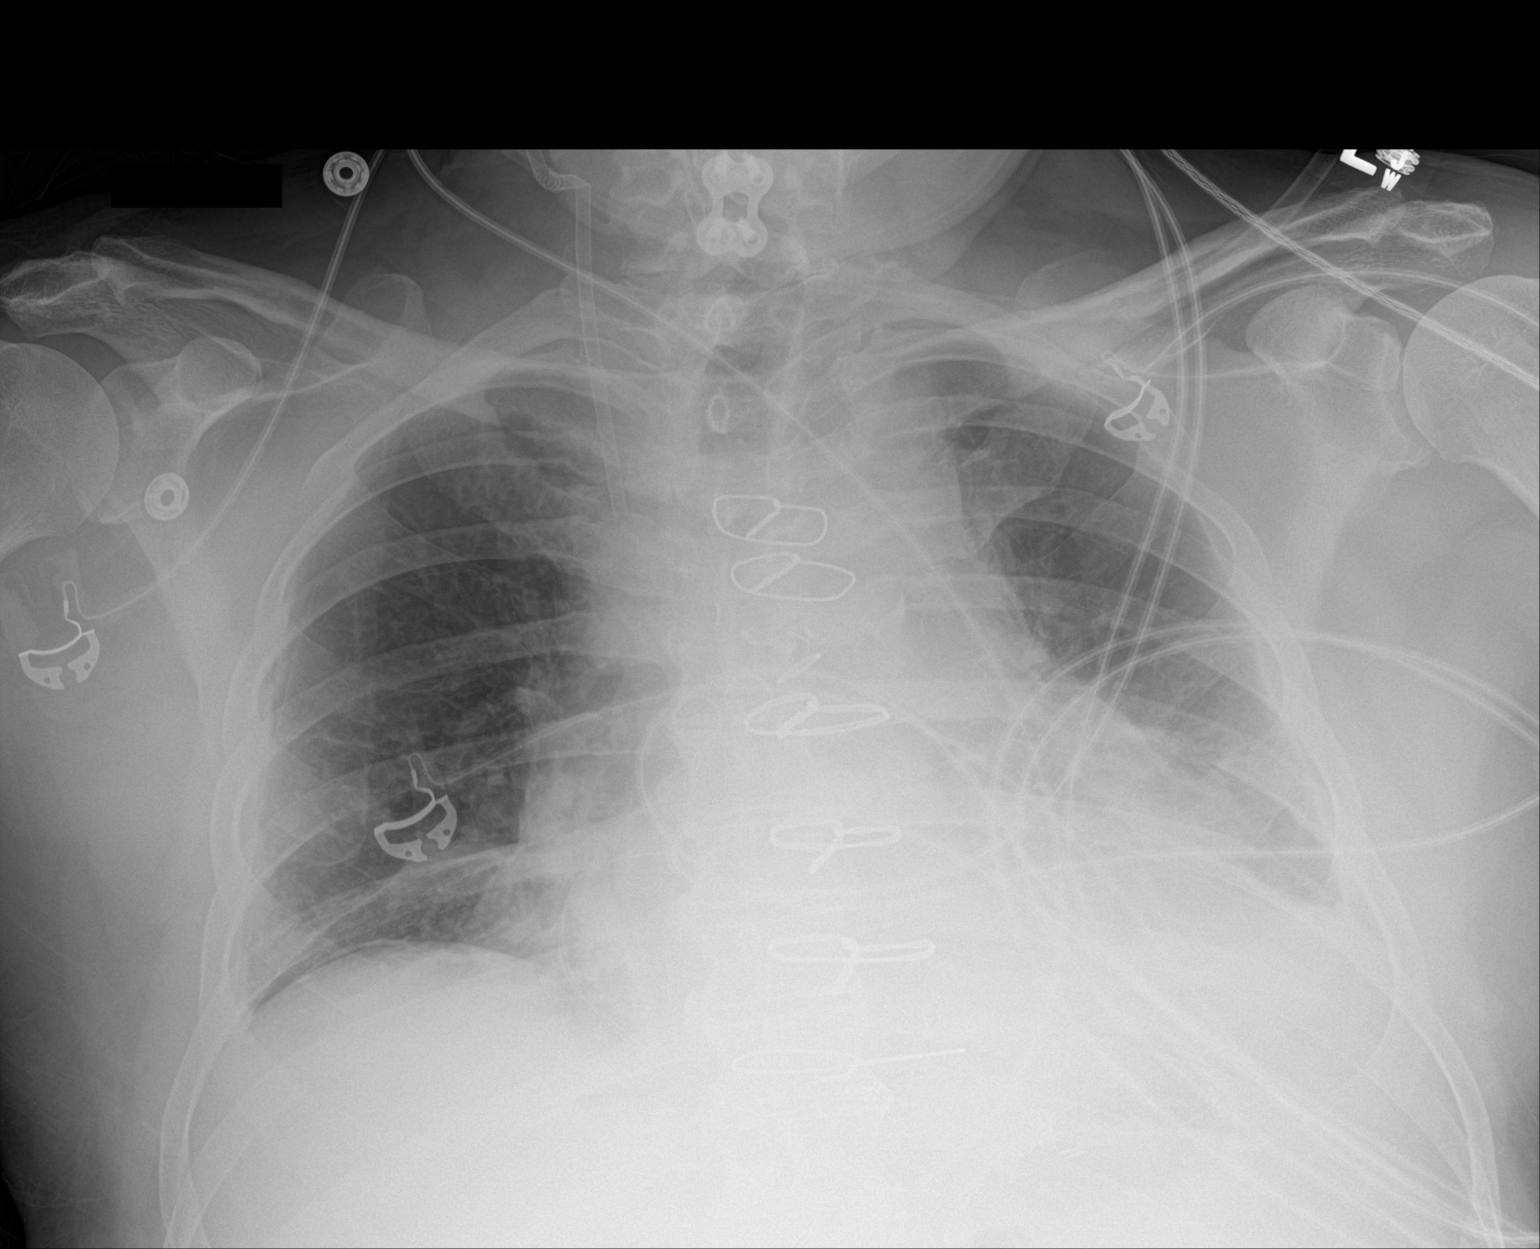

[1 of 1 positions shown; findings below may reference images not displayed]

FINDINGS: Prior CABG. Cardiomegaly with low lung volumes and bibasilar
atelectasis. Decreasing right pneumothorax. Small right lateral
pneumothorax persists. No definite effusions.
IMPRESSION: Decreasing size of the right pneumothorax, now 5% or less.

Low lung volumes with bibasilar atelectasis.

## 2016-08-16 ENCOUNTER — Other Ambulatory Visit: Payer: Self-pay | Admitting: Cardiovascular Disease

## 2016-08-16 NOTE — Telephone Encounter (Signed)
°*  STAT* If patient is at the pharmacy, call can be transferred to refill team.   1. Which medications need to be refilled? (please list name of each medication and dose if known)  LIPITOR 80 MG PO DAILY   PLAVIX 75 MG PO DAILY  LISINOPRIL 20 MG PO  DAILY   2. Which pharmacy/location (including street and city if local pharmacy) is medication to be sent to? [address]    3. Do they need a 30 day or 90 day supply? [place]

## 2016-08-17 ENCOUNTER — Other Ambulatory Visit: Payer: Self-pay

## 2016-08-17 MED ORDER — ATORVASTATIN CALCIUM 80 MG PO TABS: ORAL_TABLET | ORAL | 3 refills | Status: DC

## 2016-08-17 MED ORDER — CLOPIDOGREL BISULFATE 75 MG PO TABS: 75.0000 mg | ORAL_TABLET | Freq: Every day | ORAL | 3 refills | Status: DC

## 2016-08-17 MED ORDER — LISINOPRIL 20 MG PO TABS: 20.0000 mg | ORAL_TABLET | Freq: Every day | ORAL | 3 refills | Status: DC

## 2016-10-13 DIAGNOSIS — H0230 Blepharochalasis unspecified eye, unspecified eyelid: Secondary | ICD-10-CM | POA: Diagnosis not present

## 2016-10-13 DIAGNOSIS — H2513 Age-related nuclear cataract, bilateral: Secondary | ICD-10-CM | POA: Diagnosis not present

## 2016-10-19 DIAGNOSIS — H25811 Combined forms of age-related cataract, right eye: Secondary | ICD-10-CM | POA: Diagnosis not present

## 2016-10-19 DIAGNOSIS — H25813 Combined forms of age-related cataract, bilateral: Secondary | ICD-10-CM | POA: Diagnosis not present

## 2016-10-31 DIAGNOSIS — H25811 Combined forms of age-related cataract, right eye: Secondary | ICD-10-CM | POA: Diagnosis not present

## 2016-10-31 DIAGNOSIS — H52221 Regular astigmatism, right eye: Secondary | ICD-10-CM | POA: Diagnosis not present

## 2016-11-14 DIAGNOSIS — H25812 Combined forms of age-related cataract, left eye: Secondary | ICD-10-CM | POA: Diagnosis not present

## 2016-11-14 DIAGNOSIS — H52222 Regular astigmatism, left eye: Secondary | ICD-10-CM | POA: Diagnosis not present

## 2016-12-07 DIAGNOSIS — M1710 Unilateral primary osteoarthritis, unspecified knee: Secondary | ICD-10-CM | POA: Insufficient documentation

## 2016-12-07 DIAGNOSIS — M1712 Unilateral primary osteoarthritis, left knee: Secondary | ICD-10-CM | POA: Diagnosis not present

## 2016-12-07 DIAGNOSIS — M179 Osteoarthritis of knee, unspecified: Secondary | ICD-10-CM | POA: Insufficient documentation

## 2017-01-26 DIAGNOSIS — M1712 Unilateral primary osteoarthritis, left knee: Secondary | ICD-10-CM | POA: Diagnosis not present

## 2017-02-01 ENCOUNTER — Other Ambulatory Visit: Payer: Self-pay | Admitting: Nurse Practitioner

## 2017-02-02 DIAGNOSIS — N411 Chronic prostatitis: Secondary | ICD-10-CM | POA: Diagnosis not present

## 2017-02-02 DIAGNOSIS — N200 Calculus of kidney: Secondary | ICD-10-CM | POA: Diagnosis not present

## 2017-02-02 DIAGNOSIS — N401 Enlarged prostate with lower urinary tract symptoms: Secondary | ICD-10-CM | POA: Diagnosis not present

## 2017-02-23 ENCOUNTER — Ambulatory Visit (INDEPENDENT_AMBULATORY_CARE_PROVIDER_SITE_OTHER): Payer: PPO

## 2017-02-23 VITALS — BP 138/84 | HR 72 | Temp 98.8°F | Ht 66.0 in | Wt 212.4 lb

## 2017-02-23 DIAGNOSIS — Z0000 Encounter for general adult medical examination without abnormal findings: Secondary | ICD-10-CM | POA: Diagnosis not present

## 2017-02-23 DIAGNOSIS — Z23 Encounter for immunization: Secondary | ICD-10-CM | POA: Diagnosis not present

## 2017-02-23 NOTE — Progress Notes (Signed)
Subjective:   [name] is a 66 y.o. male who presents for Medicare Annual/Subsequent preventive examination.  Review of Systems:  N/A  Cardiac Risk Factors include: advanced age (>53men, >8 women);dyslipidemia;hypertension;male gender;obesity (BMI >30kg/m2)     Objective:    Vitals: BP 138/84 (BP Location: Left Arm)   Pulse 72   Temp 98.8 F (37.1 C) (Oral)   Ht 5\' 6"  (1.676 m)   Wt 212 lb 6.4 oz (96.3 kg)   BMI 34.28 kg/m   Body mass index is 34.28 kg/m.  Tobacco History  Smoking Status  . Never Smoker  Smokeless Tobacco  . Never Used     Counseling given: Not Answered   Past Medical History:  Diagnosis Date  . Colon polyps   . Complete heart block ([place])    a. 04/2015 in setting of inferior STEMI-->resolved.  . Coronary atherosclerosis    a. CT scan 01/02/2015: extensive coronary atherosclerosis;  b. 04/2015 Inf STEMI/Cath/CABG x 4: LIMA->LAD, VG->Diag, VG->OM2, VG->RPL.  Marland Kitchen HLD (hyperlipidemia)   . HTN (hypertension)   . Ischemic cardiomyopathy    a. 04/2015 LV gram: EF 35-40%.  . Kidney stones    a. May 2016.  . MI (myocardial infarction) ([place]) 04/2015  . Postoperative atrial fibrillation ([place])    a. 04/2015 Post-op CABG AF/AFlutter--> converted with rapid atrial pacing and amio; b. CHA2DS2VASc = 3->Coumadin.  . 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 CARDIAC CATHETERIZATION N/A 05/03/2015   Procedure: Left Heart Cath and Coronary Angiography;  Surgeon: Wellington Hampshire, MD;  Location: [place] [facility];  Service: Cardiovascular;  Laterality: N/A;  . CATARACT EXTRACTION, BILATERAL    . CORONARY ARTERY BYPASS GRAFT N/A 05/03/2015   Procedure: CORONARY ARTERY BYPASS GRAFTING (CABG) times four using left internal mammary artery and right saphenous vein.;  Surgeon: [name], MD;  Location: [place];  Service: Open Heart Surgery;  Laterality: N/A;  . HERNIA REPAIR     ventral hernia repair  . JOINT REPLACEMENT     . KNEE SURGERY     arthroscopic knee surgery  . NECK SURGERY     Spinal fusion- neck. Dr. Sherley Bounds of [facility])  . right hip replacement Right 02/04/2014   Dr. Sabra Heck  . TEE WITHOUT CARDIOVERSION N/A 05/03/2015   Procedure: TRANSESOPHAGEAL ECHOCARDIOGRAM (TEE);  Surgeon: [name], MD;  Location: [place];  Service: Open Heart Surgery;  Laterality: N/A;   Family History  Problem Relation Age of Onset  . CAD Father   . Heart attack Father   . Heart Problems Sister        CABG  . Heart attack Brother   . Heart attack Brother   . Heart attack Brother   . Heart attack Brother   . Heart attack Sister   . CAD Maternal Uncle   . CAD Paternal Uncle    History  Sexual Activity  . Sexual activity: Yes  . Birth control/ protection: None    Outpatient Encounter Prescriptions as of 02/23/2017  Medication Sig  . aspirin 81 MG EC tablet Take 1 tablet (81 mg total) by mouth daily.  Marland Kitchen atorvastatin (LIPITOR) 80 MG tablet TAKE ONE TABLET BY MOUTH ONCE DAILY AT 6PM  . clopidogrel (PLAVIX) 75 MG tablet Take 1 tablet (75 mg total) by mouth daily.  Marland Kitchen ENSURE PLUS (ENSURE PLUS) LIQD Take 237 mLs by mouth daily.  Marland Kitchen lisinopril (PRINIVIL,ZESTRIL) 20 MG  tablet Take 1 tablet (20 mg total) by mouth daily.  . meloxicam (MOBIC) 15 MG tablet Take 15 mg by mouth daily.  . Multiple Vitamin (MULTIVITAMIN) tablet Take 1 tablet by mouth daily.  . Omega-3 Fatty Acids (FISH OIL) 1200 MG CAPS Take 1,200 mg by mouth daily.  . tamsulosin (FLOMAX) 0.4 MG CAPS capsule Take 1 capsule (0.4 mg total) by mouth daily. (Patient not taking: Reported on 02/23/2017)  . [DISCONTINUED] atorvastatin (LIPITOR) 80 MG tablet TAKE ONE TABLET BY MOUTH ONCE DAILY AT 6PM   No facility-administered encounter medications on file as of 02/23/2017.     Activities of Daily Living In your present state of health, do you have any difficulty performing the following activities: 02/23/2017  Hearing? N    Vision? N  Difficulty concentrating or making decisions? N  Walking or climbing stairs? Y  Dressing or bathing? N  Doing errands, shopping? N  Preparing Food and eating ? N  Using the Toilet? N  In the past six months, have you accidently leaked urine? Y  Do you have problems with loss of bowel control? N  Managing your Medications? N  Managing your Finances? N  Housekeeping or managing your Housekeeping? N  Some recent data might be hidden    Patient Care Team: Birdie Sons, MD as PCP - General (Family Medicine) Wellington Hampshire, MD as Consulting Physician (Cardiology) Murrell Redden, MD as Consulting Physician (Urology) Earnestine Leys, MD as Consulting Physician (Specialist)   Assessment:     Exercise Activities and Dietary recommendations Current Exercise Habits: The patient has a physically strenous job, but has no regular exercise apart from work., Exercise limited by: orthopedic condition(s)  Goals    . Increase water intake          Recommend drinking 4-6 glasses of water a day.       Fall Risk Fall Risk  02/23/2017 09/15/2015 06/01/2015  Falls in the past year? Yes No No  Number falls in past yr: 1 - -  Injury with Fall? No - -  Follow up Falls prevention discussed - -   Depression Screen PHQ 2/9 Scores 02/23/2017 02/23/2017 09/15/2015 07/26/2015  PHQ - 2 Score 0 0 0 0  PHQ- 9 Score 3 - - 0    Cognitive Function     6CIT Screen 02/23/2017  What Year? 0 points  What month? 0 points  What time? 0 points  Count back from 20 0 points  Months in reverse 0 points  Repeat phrase 0 points  Total Score 0    Immunization History  Administered Date(s) Administered  . Influenza-Unspecified 05/07/2015  . Pneumococcal Conjugate-13 09/15/2015  . Pneumococcal Polysaccharide-23 02/23/2017  . Tdap 06/19/2008  . Zoster 09/15/2015   Screening Tests Health Maintenance  Topic Date Due  . COLONOSCOPY  01/13/2016  . INFLUENZA VACCINE  03/07/2017  . TETANUS/TDAP   06/19/2018  . Hepatitis C Screening  Completed  . PNA vac Low Risk Adult  Completed      Plan:  I have personally reviewed and addressed the Medicare Annual Wellness questionnaire and have noted the following in the patient's chart:  A. Medical and social history B. Use of alcohol, tobacco or illicit drugs  C. Current medications and supplements D. Functional ability and status E.  Nutritional status F.  Physical activity G. Advance directives H. List of other physicians I.  Hospitalizations, surgeries, and ER visits in previous 12 months J.  [place] such as  hearing and vision if needed, cognitive and depression L. Referrals and appointments - none  In addition, I have reviewed and discussed with patient certain preventive protocols, quality metrics, and best practice recommendations. A written personalized care plan for preventive services as well as general preventive health recommendations were provided to patient.  See attached scanned questionnaire for additional information.   Signed,  Fabio Neighbors, LPN Nurse Health Advisor   MD Recommendations: Discuss cologuard vs colonoscopy at next apt. Pt declined previous colonoscopy referral.

## 2017-02-23 NOTE — Patient Instructions (Addendum)
[name] , Thank you for taking time to come for your Medicare Wellness Visit. I appreciate your ongoing commitment to your health goals. Please review the following plan we discussed and let me know if I can assist you in the future.   Screening recommendations/referrals: Colonoscopy: completed 01/13/11, pt to discuss this further with PCP at next OV Recommended yearly ophthalmology/optometry visit for glaucoma screening and checkup Recommended yearly dental visit for hygiene and checkup  Vaccinations: Influenza vaccine: due 04/2017 Pneumococcal vaccine: Prevnar 13 given 09/15/15, Pneumovax 23 given today Tdap vaccine: completed 06/19/08, due 06/2018 Shingles vaccine: completed 09/15/15  Advanced directives: Advance directive discussed with you today. Even though you declined this today please call our office should you change your mind and we can give you the proper paperwork for you to fill out.  Conditions/risks identified: Fall risk prevention; Recommend drinking 4-6 glasses of water a day.   Next appointment: 03/09/17 @ 10:00 AM  Preventive Care 68 Years and Older, Male Preventive care refers to lifestyle choices and visits with your health care provider that can promote health and wellness. What does preventive care include?  A yearly physical exam. This is also called an annual well check.  Dental exams once or twice a year.  Routine eye exams. Ask your health care provider how often you should have your eyes checked.  Personal lifestyle choices, including:  Daily care of your teeth and gums.  Regular physical activity.  Eating a healthy diet.  Avoiding tobacco and drug use.  Limiting alcohol use.  Practicing safe sex.  Taking low doses of aspirin every day.  Taking vitamin and mineral supplements as recommended by your health care provider. What happens during an annual well check? The services and screenings done by your health care provider during your  annual well check will depend on your age, overall health, lifestyle risk factors, and family history of disease. Counseling  Your health care provider may ask you questions about your:  Alcohol use.  Tobacco use.  Drug use.  Emotional well-being.  Home and relationship well-being.  Sexual activity.  Eating habits.  History of falls.  Memory and ability to understand (cognition).  Work and work Statistician. Screening  You may have the following tests or measurements:  Height, weight, and BMI.  Blood pressure.  Lipid and cholesterol levels. These may be checked every 5 years, or more frequently if you are over 47 years old.  Skin check.  Lung cancer screening. You may have this screening every year starting at age 83 if you have a 30-pack-year history of smoking and currently smoke or have quit within the past 15 years.  Fecal occult blood test (FOBT) of the stool. You may have this test every year starting at age 85.  Flexible sigmoidoscopy or colonoscopy. You may have a sigmoidoscopy every 5 years or a colonoscopy every 10 years starting at age 57.  Prostate cancer screening. Recommendations will vary depending on your family history and other risks.  Hepatitis C blood test.  Hepatitis B blood test.  Sexually transmitted disease (STD) testing.  Diabetes screening. This is done by checking your blood sugar (glucose) after you have not eaten for a while (fasting). You may have this done every 1-3 years.  Abdominal aortic aneurysm (AAA) screening. You may need this if you are a current or former smoker.  Osteoporosis. You may be screened starting at age 45 if you are at high risk. Talk with your health care provider about your  test results, treatment options, and if necessary, the need for more tests. Vaccines  Your health care provider may recommend certain vaccines, such as:  Influenza vaccine. This is recommended every year.  Tetanus, diphtheria, and  acellular pertussis (Tdap, Td) vaccine. You may need a Td booster every 10 years.  Zoster vaccine. You may need this after age 79.  Pneumococcal 13-valent conjugate (PCV13) vaccine. One dose is recommended after age 57.  Pneumococcal polysaccharide (PPSV23) vaccine. One dose is recommended after age 52. Talk to your health care provider about which screenings and vaccines you need and how often you need them. This information is not intended to replace advice given to you by your health care provider. Make sure you discuss any questions you have with your health care provider. Document Released: 08/20/2015 Document Revised: 04/12/2016 Document Reviewed: 05/25/2015 Elsevier Interactive Patient Education  2017 [place] Prevention in the Home Falls can cause injuries. They can happen to people of all ages. There are many things you can do to make your home safe and to help prevent falls. What can I do on the outside of my home?  Regularly fix the edges of walkways and driveways and fix any cracks.  Remove anything that might make you trip as you walk through a door, such as a raised step or threshold.  Trim any bushes or trees on the path to your home.  Use bright outdoor lighting.  Clear any walking paths of anything that might make someone trip, such as rocks or tools.  Regularly check to see if handrails are loose or broken. Make sure that both sides of any steps have handrails.  Any raised decks and porches should have guardrails on the edges.  Have any leaves, snow, or ice cleared regularly.  Use sand or salt on walking paths during winter.  Clean up any spills in your garage right away. This includes oil or grease spills. What can I do in the bathroom?  Use night lights.  Install grab bars by the toilet and in the tub and shower. Do not use towel bars as grab bars.  Use non-skid mats or decals in the tub or shower.  If you need to sit down in the shower, use  a plastic, non-slip stool.  Keep the floor dry. Clean up any water that spills on the floor as soon as it happens.  Remove soap buildup in the tub or shower regularly.  Attach bath mats securely with double-sided non-slip rug tape.  Do not have throw rugs and other things on the floor that can make you trip. What can I do in the bedroom?  Use night lights.  Make sure that you have a light by your bed that is easy to reach.  Do not use any sheets or blankets that are too big for your bed. They should not hang down onto the floor.  Have a firm chair that has side arms. You can use this for support while you get dressed.  Do not have throw rugs and other things on the floor that can make you trip. What can I do in the kitchen?  Clean up any spills right away.  Avoid walking on wet floors.  Keep items that you use a lot in easy-to-reach places.  If you need to reach something above you, use a strong step stool that has a grab bar.  Keep electrical cords out of the way.  Do not use floor polish or wax that  makes floors slippery. If you must use wax, use non-skid floor wax.  Do not have throw rugs and other things on the floor that can make you trip. What can I do with my stairs?  Do not leave any items on the stairs.  Make sure that there are handrails on both sides of the stairs and use them. Fix handrails that are broken or loose. Make sure that handrails are as long as the stairways.  Check any carpeting to make sure that it is firmly attached to the stairs. Fix any carpet that is loose or worn.  Avoid having throw rugs at the top or bottom of the stairs. If you do have throw rugs, attach them to the floor with carpet tape.  Make sure that you have a light switch at the top of the stairs and the bottom of the stairs. If you do not have them, ask someone to add them for you. What else can I do to help prevent falls?  Wear shoes that:  Do not have high heels.  Have  rubber bottoms.  Are comfortable and fit you well.  Are closed at the toe. Do not wear sandals.  If you use a stepladder:  Make sure that it is fully opened. Do not climb a closed stepladder.  Make sure that both sides of the stepladder are locked into place.  Ask someone to hold it for you, if possible.  Clearly mark and make sure that you can see:  Any grab bars or handrails.  First and last steps.  Where the edge of each step is.  Use tools that help you move around (mobility aids) if they are needed. These include:  Canes.  Walkers.  Scooters.  Crutches.  Turn on the lights when you go into a dark area. Replace any light bulbs as soon as they burn out.  Set up your furniture so you have a clear path. Avoid moving your furniture around.  If any of your floors are uneven, fix them.  If there are any pets around you, be aware of where they are.  Review your medicines with your doctor. Some medicines can make you feel dizzy. This can increase your chance of falling. Ask your doctor what other things that you can do to help prevent falls. This information is not intended to replace advice given to you by your health care provider. Make sure you discuss any questions you have with your health care provider. Document Released: 05/20/2009 Document Revised: 12/30/2015 Document Reviewed: 08/28/2014 Elsevier Interactive Patient Education  2017 [employer].

## 2017-03-01 ENCOUNTER — Ambulatory Visit: Payer: 59

## 2017-03-05 ENCOUNTER — Other Ambulatory Visit: Payer: Self-pay

## 2017-03-05 MED ORDER — ATORVASTATIN CALCIUM 80 MG PO TABS: ORAL_TABLET | ORAL | 0 refills | Status: DC

## 2017-03-09 ENCOUNTER — Encounter: Payer: Self-pay | Admitting: Family Medicine

## 2017-03-09 ENCOUNTER — Ambulatory Visit (INDEPENDENT_AMBULATORY_CARE_PROVIDER_SITE_OTHER): Payer: PPO | Admitting: Family Medicine

## 2017-03-09 VITALS — BP 116/64 | HR 68 | Temp 97.8°F | Resp 18 | Ht 66.75 in | Wt 209.0 lb

## 2017-03-09 DIAGNOSIS — Z86010 Personal history of colonic polyps: Secondary | ICD-10-CM

## 2017-03-09 DIAGNOSIS — Z125 Encounter for screening for malignant neoplasm of prostate: Secondary | ICD-10-CM | POA: Diagnosis not present

## 2017-03-09 DIAGNOSIS — Z6833 Body mass index (BMI) 33.0-33.9, adult: Secondary | ICD-10-CM

## 2017-03-09 DIAGNOSIS — E785 Hyperlipidemia, unspecified: Secondary | ICD-10-CM

## 2017-03-09 DIAGNOSIS — L989 Disorder of the skin and subcutaneous tissue, unspecified: Secondary | ICD-10-CM

## 2017-03-09 DIAGNOSIS — R739 Hyperglycemia, unspecified: Secondary | ICD-10-CM | POA: Diagnosis not present

## 2017-03-09 DIAGNOSIS — Z860101 Personal history of adenomatous and serrated colon polyps: Secondary | ICD-10-CM

## 2017-03-09 DIAGNOSIS — Z0000 Encounter for general adult medical examination without abnormal findings: Secondary | ICD-10-CM

## 2017-03-09 DIAGNOSIS — I2510 Atherosclerotic heart disease of native coronary artery without angina pectoris: Secondary | ICD-10-CM

## 2017-03-09 DIAGNOSIS — I10 Essential (primary) hypertension: Secondary | ICD-10-CM

## 2017-03-09 NOTE — Patient Instructions (Addendum)
   You are due for follow up colonoscopy with Dr. Gustavo Lah. Please call [facility] as soon as possible to have this scheduled.

## 2017-03-09 NOTE — Progress Notes (Signed)
Patient: [name], Male    DOB: 1951-07-05, 66 y.o.   MRN: [id number] Visit Date: 03/09/2017  Today's Provider: [name], MD   Chief Complaint  Patient presents with  . Annual Exam   Subjective:   [name] is a 66 y.o. male who presents today for his annual physicalit. He feels fairly well. He reports exercising none. He reports he is sleeping well.  Patient would like to have his skinned checked for any lesions or moles.He has one in particular he is concerned about on his lower back.  Patient was seen by nurse health advisor on 02/23/2017 for AWV.   Immunization History  Administered Date(s) Administered  . Influenza-Unspecified 05/07/2015  . Pneumococcal Conjugate-13 09/15/2015  . Pneumococcal Polysaccharide-23 02/23/2017  . Tdap 06/19/2008  . Zoster 09/15/2015   01/13/2011 Colonoscopy, Internal hemorrhoid, Diverticulosis, Tubular Adenoma Repeat in 5 years  Labs Lab Results  Component Value Date   CHOL 128 06/23/2016   CHOL 170 07/05/2015   CHOL 230 (H) 05/03/2015   Lab Results  Component Value Date   HDL 25 (L) 06/23/2016   HDL 29 (L) 07/05/2015   HDL 30 (L) 05/03/2015   Lab Results  Component Value Date   LDLCALC 46 06/23/2016   LDLCALC 82 07/05/2015   LDLCALC 168 (H) 05/03/2015   Lab Results  Component Value Date   TRIG 286 (H) 06/23/2016   TRIG 293 (H) 07/05/2015   TRIG 162 (H) 05/03/2015   Lab Results  Component Value Date   CHOLHDL 5.1 (H) 06/23/2016   CHOLHDL 5.9 (H) 07/05/2015   CHOLHDL 7.7 05/03/2015   No results found for: LDLDIRECT   Lab Results  Component Value Date   CREATININE 1.08 06/23/2016   BUN 14 06/23/2016   NA 142 06/23/2016   K 4.4 06/23/2016   CL 103 06/23/2016   CO2 25 06/23/2016   Lab Results  Component Value Date   TSH 1.27 12/31/2014    Review of Systems  Constitutional: Positive for fatigue.  HENT: Positive for congestion, rhinorrhea, sinus pain and sneezing.   Eyes: Negative.   Respiratory:  Positive for cough.   Cardiovascular: Negative.   Gastrointestinal: Negative.   Endocrine: Negative.   Genitourinary: Negative.   Musculoskeletal: Positive for arthralgias (Left knee).  Skin:       Lesion on lower back  Allergic/Immunologic: Negative.   Neurological: Negative.   Hematological: Negative.   Psychiatric/Behavioral: Negative.     Patient Active Problem List   Diagnosis Date Noted  . History of adenomatous polyp of colon 09/15/2015  . 1st degree AV block 09/14/2015  . Sciatica of right side 09/14/2015  . Bilateral kidney stones 09/14/2015  . Fam hx-ischem heart disease 09/14/2015  . Episodic lightheadedness 09/14/2015  . Complete heart block ([place])   . Postoperative atrial fibrillation ([place])   . HLD (hyperlipidemia)   . HTN (hypertension)   . Coronary atherosclerosis   . ST elevation myocardial infarction (STEMI) of inferior wall ([place]) 05/03/2015  . CAD (coronary artery disease) 05/03/2015  . 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 Marital status: Married    Spouse name: N/A  . Number of children: 1  . Years of education: N/A   Occupational History  . Truck Geophysicist/field seismologist    Social History Main Topics  . Smoking status: Never Smoker  . Smokeless tobacco: Never Used  . Alcohol use 0.0 oz/week     Comment: occasional use- beer  .  Drug use: No  . Sexual activity: Yes    Birth control/ protection: None   Other Topics Concern  . Not on file   Social History Narrative  . No narrative on file    Past Surgical History:  Procedure Laterality Date  . CARDIAC CATHETERIZATION N/A 05/03/2015   Procedure: Left Heart Cath and Coronary Angiography;  Surgeon: Wellington Hampshire, MD;  Location: [facility];  Service: Cardiovascular;  Laterality: N/A;  . CATARACT EXTRACTION, BILATERAL    . CORONARY ARTERY BYPASS GRAFT N/A 05/03/2015   Procedure: CORONARY ARTERY BYPASS GRAFTING (CABG) times four using left internal mammary artery and right  saphenous vein.;  Surgeon: [name], MD;  Location: [facility];  Service: Open Heart Surgery;  Laterality: N/A;  . HERNIA REPAIR     ventral hernia repair  . JOINT REPLACEMENT    . KNEE SURGERY     arthroscopic knee surgery  . NECK SURGERY     Spinal fusion- neck. Dr. Sherley Bounds of [facility])  . right hip replacement Right 02/04/2014   Dr. Sabra Heck  . TEE WITHOUT CARDIOVERSION N/A 05/03/2015   Procedure: TRANSESOPHAGEAL ECHOCARDIOGRAM (TEE);  Surgeon: [name], MD;  Location: [facility];  Service: Open Heart Surgery;  Laterality: N/A;    His family history includes CAD in his father, maternal uncle, and paternal uncle; Heart Problems in his sister; Heart attack in his brother, brother, brother, brother, father, and sister.     Outpatient Encounter Prescriptions as of 03/09/2017  Medication Sig  . aspirin 81 MG EC tablet Take 1 tablet (81 mg total) by mouth daily.  Marland Kitchen atorvastatin (LIPITOR) 80 MG tablet TAKE ONE TABLET BY MOUTH ONCE DAILY AT 6PM  . clopidogrel (PLAVIX) 75 MG tablet Take 1 tablet (75 mg total) by mouth daily.  Marland Kitchen ENSURE PLUS (ENSURE PLUS) LIQD Take 237 mLs by mouth daily.  Marland Kitchen lisinopril (PRINIVIL,ZESTRIL) 20 MG tablet Take 1 tablet (20 mg total) by mouth daily.  . meloxicam (MOBIC) 15 MG tablet Take 15 mg by mouth daily.  . Multiple Vitamin (MULTIVITAMIN) tablet Take 1 tablet by mouth daily.  . Omega-3 Fatty Acids (FISH OIL) 1200 MG CAPS Take 1,200 mg by mouth daily.  . tamsulosin (FLOMAX) 0.4 MG CAPS capsule Take 1 capsule (0.4 mg total) by mouth daily.   No facility-administered encounter medications on file as of 03/09/2017.     Allergies  Allergen Reactions  . Lovastatin Other (See Comments)    Chest pain  . Pravastatin Other (See Comments)    Nose bleed    Patient Care Team: Birdie Sons, MD as PCP - General (Family Medicine) Wellington Hampshire, MD as Consulting Physician (Cardiology) Murrell Redden, MD as Consulting  Physician (Urology) Earnestine Leys, MD as Consulting Physician (Specialist)   Objective:    Vitals:   03/09/17 1019  BP: 116/64  Pulse: 68  Resp: 18  Temp: 97.8 F (36.6 C)  TempSrc: Oral  Weight: 209 lb (94.8 kg)  Height: 5' 6.75" (1.695 m)     Physical Exam   General Appearance:    Alert, cooperative, no distress, appears stated age, obese  Head:    Normocephalic, without obvious abnormality, atraumatic  Eyes:    PERRL, conjunctiva/corneas clear, EOM's intact, fundi    benign, both eyes       Ears:    Normal TM's and external ear canals, both ears  Nose:   Nares normal, septum midline, mucosa normal, no drainage  or sinus tenderness  Throat:   Lips, mucosa, and tongue normal; teeth and gums normal  Neck:   Supple, symmetrical, trachea midline, no adenopathy;       thyroid:  No enlargement/tenderness/nodules; no carotid   bruit or JVD  Back:     Symmetric, no curvature, ROM normal, no CVA tenderness  Lungs:     Clear to auscultation bilaterally, respirations unlabored  Chest wall:    No tenderness or deformity  Heart:    Regular rate and rhythm, S1 and S2 normal, no murmur, rub   or gallop  Abdomen:     Soft, non-tender, bowel sounds active all four quadrants,    no masses, no organomegaly  Genitalia:    deferred  Rectal:    deferred  Extremities:   Extremities normal, atraumatic, no cyanosis or edema  Pulses:   2+ and symmetric all extremities  Lymph nodes:   Cervical, supraclavicular, and axillary nodes normal  Neurologic:   CNII-XII intact. Normal strength, sensation and reflexes      throughout    Assessment & Plan:     Yearly physical Reviewed patient's Family Medical History Reviewed and updated list of patient's medical providers Assessment of cognitive impairment was done Assessed patient's functional ability Established a written schedule for health screening [place] Completed and Reviewed  Current Exercise Habits: The  patient does not participate in regular exercise at present    Exercise Activities and Dietary recommendations Goals    . Increase water intake          Recommend drinking 4-6 glasses of water a day.        Immunization History  Administered Date(s) Administered  . Influenza-Unspecified 05/07/2015  . Pneumococcal Conjugate-13 09/15/2015  . Pneumococcal Polysaccharide-23 02/23/2017  . Tdap 06/19/2008  . Zoster 09/15/2015    Health Maintenance  Topic Date Due  . COLONOSCOPY  01/13/2016  . INFLUENZA VACCINE  03/07/2017  . TETANUS/TDAP  06/19/2018  . Hepatitis C Screening  Completed  . PNA vac Low Risk Adult  Completed     Discussed health benefits of physical activity, and encouraged him to engage in regular exercise appropriate for his age and condition.   1. Annual physical exam Generally doing well.   2. Atherosclerosis of native coronary artery of native heart without angina pectoris Asymptomatic. Compliant with medication.  Continue aggressive risk factor modification.  Continue follow up Dr. Fletcher Anon as scheduled.   3. Essential hypertension Well controlled.  Continue current medications.    4. Hyperlipidemia, unspecified hyperlipidemia type He is tolerating atorvastatin well with no adverse effects.   - Lipid panel - Comprehensive metabolic panel - CBC  5. History of adenomatous polyp of colon He states he was scheduled for colonoscopy last year, but had to cancel. He is to contact [facility] to reschedule.   6. Hyperglycemia  - Hemoglobin A1c  7. Prostate cancer screening  - PSA  8. BMI 33.0-33.9,adult Encourage increasing physical activity and to reduce calorie intake.   9. Skin lesion of back Multiple SKs and other hyperkeratotic lesions varying in pigmentation across entirety of back.  - Ambulatory referral to Dermatology

## 2017-03-10 LAB — COMPREHENSIVE METABOLIC PANEL
ALBUMIN: 4.3 g/dL (ref 3.6–4.8)
ALK PHOS: 116 IU/L (ref 39–117)
ALT: 31 IU/L (ref 0–44)
AST: 27 IU/L (ref 0–40)
Albumin/Globulin Ratio: 1.9 (ref 1.2–2.2)
BUN / CREAT RATIO: 12 (ref 10–24)
BUN: 13 mg/dL (ref 8–27)
Bilirubin Total: 1.1 mg/dL (ref 0.0–1.2)
CALCIUM: 9.1 mg/dL (ref 8.6–10.2)
CO2: 24 mmol/L (ref 20–29)
CREATININE: 1.09 mg/dL (ref 0.76–1.27)
Chloride: 102 mmol/L (ref 96–106)
GFR, EST AFRICAN AMERICAN: 81 mL/min/{1.73_m2} (ref >59)
GFR, EST NON AFRICAN AMERICAN: 70 mL/min/{1.73_m2} (ref >59)
GLOBULIN, TOTAL: 2.3 g/dL (ref 1.5–4.5)
Glucose: 131 mg/dL — ABNORMAL HIGH (ref 65–99)
Potassium: 4.7 mmol/L (ref 3.5–5.2)
SODIUM: 140 mmol/L (ref 134–144)
TOTAL PROTEIN: 6.6 g/dL (ref 6.0–8.5)

## 2017-03-10 LAB — CBC
HEMATOCRIT: 41.7 % (ref 37.5–51.0)
Hemoglobin: 13.9 g/dL (ref 13.0–17.7)
MCH: 27.4 pg (ref 26.6–33.0)
MCHC: 33.3 g/dL (ref 31.5–35.7)
MCV: 82 fL (ref 79–97)
Platelets: 231 10*3/uL (ref 150–379)
RBC: 5.08 x10E6/uL (ref 4.14–5.80)
RDW: 15.1 % (ref 12.3–15.4)
WBC: 8.9 10*3/uL (ref 3.4–10.8)

## 2017-03-10 LAB — LIPID PANEL
CHOL/HDL RATIO: 5.4 ratio — AB (ref 0.0–5.0)
Cholesterol, Total: 136 mg/dL (ref 100–199)
HDL: 25 mg/dL — AB (ref >39)
LDL Calculated: 52 mg/dL (ref 0–99)
TRIGLYCERIDES: 294 mg/dL — AB (ref 0–149)
VLDL Cholesterol Cal: 59 mg/dL — ABNORMAL HIGH (ref 5–40)

## 2017-03-10 LAB — HEMOGLOBIN A1C
Est. average glucose Bld gHb Est-mCnc: 134 mg/dL
Hgb A1c MFr Bld: 6.3 % — ABNORMAL HIGH (ref 4.8–5.6)

## 2017-03-10 LAB — PSA: Prostate Specific Ag, Serum: 0.8 ng/mL (ref 0.0–4.0)

## 2017-03-16 ENCOUNTER — Encounter: Payer: Self-pay | Admitting: Cardiovascular Disease

## 2017-03-16 ENCOUNTER — Ambulatory Visit (INDEPENDENT_AMBULATORY_CARE_PROVIDER_SITE_OTHER): Payer: PPO | Admitting: Cardiovascular Disease

## 2017-03-16 VITALS — BP 140/76 | HR 76 | Ht 66.0 in | Wt 207.2 lb

## 2017-03-16 DIAGNOSIS — I2510 Atherosclerotic heart disease of native coronary artery without angina pectoris: Secondary | ICD-10-CM

## 2017-03-16 DIAGNOSIS — E782 Mixed hyperlipidemia: Secondary | ICD-10-CM | POA: Diagnosis not present

## 2017-03-16 DIAGNOSIS — I10 Essential (primary) hypertension: Secondary | ICD-10-CM

## 2017-03-16 MED ORDER — LISINOPRIL 20 MG PO TABS: 20.0000 mg | ORAL_TABLET | Freq: Every day | ORAL | 3 refills | Status: DC

## 2017-03-16 MED ORDER — ATORVASTATIN CALCIUM 80 MG PO TABS: ORAL_TABLET | ORAL | 3 refills | Status: DC

## 2017-03-16 NOTE — Patient Instructions (Signed)
Medication Instructions:  Your physician has recommended you make the following change in your medication:  STOP taking plavix   Labwork: none  Testing/Procedures: none  Follow-Up: Your physician wants you to follow-up in: one year with Dr. Fletcher Anon.  You will receive a reminder letter in the mail two months in advance. If you don't receive a letter, please call our office to schedule the follow-up appointment.   Any Other Special Instructions Will Be Listed Below (If Applicable).     If you need a refill on your cardiac medications before your next appointment, please call your pharmacy.

## 2017-03-16 NOTE — Progress Notes (Signed)
Cardiology Office Note   Date:  03/16/2017   ID:  [name], DOB April 10, 1951, MRN [id number]  PCP:  Birdie Sons, MD  Cardiologist:   Kathlyn Sacramento, MD   Chief Complaint  Patient presents with  . other    9 month follow up. Meds reviewed by the pt. verbally. "doing well."       History of Present Illness: [name] is a 66 y.o. male who presents for a follow-up visit regarding coronary artery disease status post CABG September 2016. He presented then with inferior ST elevation myocardial infarction. Cardiac catheterization showed severe three-vessel coronary artery disease. The culprit was an occluded distal RCA with heavy thrombus burden. Ejection fraction was 35-40%. Aspiration thrombectomy and balloon angioplasty was performed with restoration of TIMI-3 flow. However, the results were overall suboptimal and thus the patient underwent urgent CABG by Dr. Roxan Hockey.  The patient works as a Administrator. He underwent a treadmill stress test in February 2017 which showed evidence of prior inferior infarct without significant ischemia. Ejection fraction was normal with excellent exercise capacity.   He has been doing well overall with no chest pain or shortness of breath. 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 He was prescribed meloxicam. He continues to be on dual antiplatelet therapy. His most recent hemoglobin A1c was elevated at 6.3 with elevated triglyceride. He is trying to improve his diet.  Past Medical History:  Diagnosis Date  . Colon polyps   . Complete heart block ([place])    a. 04/2015 in setting of inferior STEMI-->resolved.  . Coronary atherosclerosis    a. CT scan 01/02/2015: extensive coronary atherosclerosis;  b. 04/2015 Inf STEMI/Cath/CABG x 4: LIMA->LAD, VG->Diag, VG->OM2, VG->RPL.  . Ischemic cardiomyopathy    a. 04/2015 LV gram: EF 35-40%.  . Kidney stones    a. May 2016.  . MI (myocardial infarction) ([place]) 04/2015  . Postoperative  atrial fibrillation ([place])    a. 04/2015 Post-op CABG AF/AFlutter--> converted with rapid atrial pacing and amio; b. CHA2DS2VASc = 3->Coumadin.  . 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 CARDIAC CATHETERIZATION N/A 05/03/2015   Procedure: Left Heart Cath and Coronary Angiography;  Surgeon: Wellington Hampshire, MD;  Location: [facility];  Service: Cardiovascular;  Laterality: N/A;  . CATARACT EXTRACTION, BILATERAL    . CORONARY ARTERY BYPASS GRAFT N/A 05/03/2015   Procedure: CORONARY ARTERY BYPASS GRAFTING (CABG) times four using left internal mammary artery and right saphenous vein.;  Surgeon: [name], MD;  Location: [facility];  Service: Open Heart Surgery;  Laterality: N/A;  . HERNIA REPAIR     ventral hernia repair  . JOINT REPLACEMENT    . KNEE SURGERY     arthroscopic knee surgery  . NECK SURGERY     Spinal fusion- neck. Dr. Sherley Bounds of [facility])  . right hip replacement Right 02/04/2014   Dr. Sabra Heck  . TEE WITHOUT CARDIOVERSION N/A 05/03/2015   Procedure: TRANSESOPHAGEAL ECHOCARDIOGRAM (TEE);  Surgeon: [name], MD;  Location: [facility];  Service: Open Heart Surgery;  Laterality: N/A;     Current Outpatient Prescriptions  Medication Sig Dispense Refill  . aspirin 81 MG EC tablet Take 1 tablet (81 mg total) by mouth daily.    Marland Kitchen atorvastatin (LIPITOR) 80 MG tablet TAKE ONE TABLET BY MOUTH ONCE DAILY AT 6PM 30 tablet 0  . clopidogrel (PLAVIX) 75 MG  tablet Take 1 tablet (75 mg total) by mouth daily. 90 tablet 3  . ENSURE PLUS (ENSURE PLUS) LIQD Take 237 mLs by mouth daily.    Marland Kitchen lisinopril (PRINIVIL,ZESTRIL) 20 MG tablet Take 1 tablet (20 mg total) by mouth daily. 90 tablet 3  . meloxicam (MOBIC) 15 MG tablet Take 15 mg by mouth daily.    . Multiple Vitamin (MULTIVITAMIN) tablet Take 1 tablet by mouth daily.    . Omega-3 Fatty Acids (FISH OIL) 1200 MG CAPS Take 1,200 mg by mouth daily.    .  tamsulosin (FLOMAX) 0.4 MG CAPS capsule Take 1 capsule (0.4 mg total) by mouth daily. 30 capsule 0   No current facility-administered medications for this visit.     Allergies:   Lovastatin and Pravastatin    Social History:  The patient  reports that he has never smoked. He has never used smokeless tobacco. He reports that he drinks alcohol. He reports that he does not use drugs.   Family History:  The patient's family history includes CAD in his father, maternal uncle, and paternal uncle; Heart Problems in his sister; Heart attack in his brother, brother, brother, brother, father, and sister.    ROS:  Please see the history of present illness.   Otherwise, review of systems are positive for none.   All other systems are reviewed and negative.    PHYSICAL EXAM: VS:  BP 140/76 (BP Location: Left Arm, Patient Position: Sitting, Cuff Size: Normal)   Pulse 76   Ht 5\' 6"  (1.676 m)   Wt 207 lb 4 oz (94 kg)   BMI 33.45 kg/m  , BMI Body mass index is 33.45 kg/m. GEN: Well nourished, well developed, in no acute distress  HEENT: normal  Neck: no JVD, carotid bruits, or masses Cardiac: RRR; no murmurs, rubs, or gallops,no edema  Respiratory:  clear to auscultation bilaterally, normal work of breathing GI: soft, nontender, nondistended, + BS MS: no deformity or atrophy  Skin: warm and dry, no rash Neuro:  Strength and sensation are intact Psych: euthymic mood, full affect   EKG:  EKG is ordered today. The ekg ordered today demonstrates normal sinus rhythm with first-degree AV block. Old inferior infarct.   Recent Labs: 03/09/2017: ALT 31; BUN 13; Creatinine, Ser 1.09; Hemoglobin 13.9; Platelets 231; Potassium 4.7; Sodium 140    Lipid Panel    Component Value Date/Time   CHOL 136 03/09/2017 1058   TRIG 294 (H) 03/09/2017 1058   HDL 25 (L) 03/09/2017 1058   CHOLHDL 5.4 (H) 03/09/2017 1058   CHOLHDL 7.7 05/03/2015 0921   VLDL 32 05/03/2015 0921   LDLCALC 52 03/09/2017 1058        Wt Readings from Last 3 Encounters:  03/16/17 207 lb 4 oz (94 kg)  03/09/17 209 lb (94.8 kg)  02/23/17 212 lb 6.4 oz (96.3 kg)      No flowsheet data found.    ASSESSMENT AND PLAN:  1.  Coronary artery disease involving native coronary arteries without angina: He is doing extremely well with no anginal symptoms. Continue medical therapy. It has been almost 2 years since his myocardial infarction. Thus, I discontinued clopidogrel to minimize the risk of bleeding especially that he is using the [place] gamma as needed for arthritis.  2. Hyperlipidemia: Continue high dose atorvastatin. I reviewed his recent labs with him which showed an LDL of 52. This is at target. However, his triglyceride was 294 which is likely due to poor diet choices. I  discussed this with him in detail today and he already started working on his lifestyle.  3. Essential hypertension: Blood pressure improved since increasing the dose of lisinopril.   Disposition:   FU with me in 12 months  Signed,  Kathlyn Sacramento, MD  03/16/2017 11:38 AM    [place]

## 2017-03-23 ENCOUNTER — Encounter: Payer: Self-pay | Admitting: Family Medicine

## 2017-04-10 ENCOUNTER — Other Ambulatory Visit: Payer: Self-pay | Admitting: *Deleted

## 2017-04-10 MED ORDER — ATORVASTATIN CALCIUM 80 MG PO TABS: ORAL_TABLET | ORAL | 3 refills | Status: DC

## 2017-04-19 ENCOUNTER — Ambulatory Visit: Payer: PPO

## 2017-04-21 ENCOUNTER — Ambulatory Visit: Payer: PPO

## 2017-05-05 ENCOUNTER — Ambulatory Visit: Payer: PPO

## 2017-05-12 ENCOUNTER — Ambulatory Visit: Payer: PPO

## 2017-05-26 ENCOUNTER — Ambulatory Visit: Payer: PPO

## 2017-06-02 ENCOUNTER — Ambulatory Visit (INDEPENDENT_AMBULATORY_CARE_PROVIDER_SITE_OTHER): Payer: PPO

## 2017-06-02 DIAGNOSIS — Z23 Encounter for immunization: Secondary | ICD-10-CM

## 2017-06-22 ENCOUNTER — Other Ambulatory Visit: Payer: Self-pay

## 2017-06-22 MED ORDER — ATORVASTATIN CALCIUM 80 MG PO TABS: ORAL_TABLET | ORAL | 3 refills | Status: DC

## 2017-07-05 DIAGNOSIS — L570 Actinic keratosis: Secondary | ICD-10-CM | POA: Diagnosis not present

## 2017-07-05 DIAGNOSIS — D485 Neoplasm of uncertain behavior of skin: Secondary | ICD-10-CM | POA: Diagnosis not present

## 2017-07-05 DIAGNOSIS — L812 Freckles: Secondary | ICD-10-CM | POA: Diagnosis not present

## 2017-07-05 DIAGNOSIS — C44612 Basal cell carcinoma of skin of right upper limb, including shoulder: Secondary | ICD-10-CM | POA: Diagnosis not present

## 2017-07-05 DIAGNOSIS — D225 Melanocytic nevi of trunk: Secondary | ICD-10-CM | POA: Diagnosis not present

## 2017-07-05 DIAGNOSIS — L821 Other seborrheic keratosis: Secondary | ICD-10-CM | POA: Diagnosis not present

## 2017-07-05 DIAGNOSIS — L578 Other skin changes due to chronic exposure to nonionizing radiation: Secondary | ICD-10-CM | POA: Diagnosis not present

## 2017-07-05 DIAGNOSIS — R234 Changes in skin texture: Secondary | ICD-10-CM | POA: Diagnosis not present

## 2017-07-05 DIAGNOSIS — Z1283 Encounter for screening for malignant neoplasm of skin: Secondary | ICD-10-CM | POA: Diagnosis not present

## 2017-08-16 DIAGNOSIS — L821 Other seborrheic keratosis: Secondary | ICD-10-CM | POA: Diagnosis not present

## 2017-08-16 DIAGNOSIS — L578 Other skin changes due to chronic exposure to nonionizing radiation: Secondary | ICD-10-CM | POA: Diagnosis not present

## 2017-08-16 DIAGNOSIS — C44519 Basal cell carcinoma of skin of other part of trunk: Secondary | ICD-10-CM | POA: Diagnosis not present

## 2017-10-04 DIAGNOSIS — L578 Other skin changes due to chronic exposure to nonionizing radiation: Secondary | ICD-10-CM | POA: Diagnosis not present

## 2017-10-04 DIAGNOSIS — L821 Other seborrheic keratosis: Secondary | ICD-10-CM | POA: Diagnosis not present

## 2017-10-04 DIAGNOSIS — Z85828 Personal history of other malignant neoplasm of skin: Secondary | ICD-10-CM | POA: Diagnosis not present

## 2017-10-04 DIAGNOSIS — L570 Actinic keratosis: Secondary | ICD-10-CM | POA: Diagnosis not present

## 2017-10-04 DIAGNOSIS — Z1283 Encounter for screening for malignant neoplasm of skin: Secondary | ICD-10-CM | POA: Diagnosis not present

## 2018-01-28 ENCOUNTER — Telehealth: Payer: Self-pay

## 2018-01-28 NOTE — Telephone Encounter (Signed)
LMTCB and schedule AWV after 02/23/18. -MM

## 2018-02-21 ENCOUNTER — Ambulatory Visit
Admission: RE | Admit: 2018-02-21 | Discharge: 2018-02-21 | Disposition: A | Discharge status: Home / Self Care | Payer: PPO | Source: Ambulatory Visit | Attending: Urology | Admitting: Urology

## 2018-02-21 ENCOUNTER — Ambulatory Visit: Payer: PPO | Admitting: Urology

## 2018-02-21 ENCOUNTER — Encounter: Payer: Self-pay | Admitting: Urology

## 2018-02-21 VITALS — BP 119/67 | HR 76 | Ht 66.0 in | Wt 200.0 lb

## 2018-02-21 DIAGNOSIS — N200 Calculus of kidney: Secondary | ICD-10-CM | POA: Diagnosis not present

## 2018-02-21 LAB — URINALYSIS, COMPLETE
BILIRUBIN UA: NEGATIVE
Glucose, UA: NEGATIVE
KETONES UA: NEGATIVE
LEUKOCYTES UA: NEGATIVE
Nitrite, UA: NEGATIVE
PROTEIN UA: NEGATIVE
RBC UA: NEGATIVE
Specific Gravity, UA: 1.025 (ref 1.005–1.030)
Urobilinogen, Ur: 0.2 mg/dL (ref 0.2–1.0)
pH, UA: 5.5 (ref 5.0–7.5)

## 2018-02-21 LAB — MICROSCOPIC EXAMINATION

## 2018-02-21 NOTE — Progress Notes (Signed)
02/21/2018 2:56 PM   [name] [birth year] [id number]  Referring provider: Birdie Sons, MD [address] [place], [place] [address]  Chief Complaint  Patient presents with  . Nephrolithiasis   Urologic problems: - 7 mm left upper pole renal calculus-asymptomatic  HPI: [name] is a 67 year old male who presents to establish local urologic care.  He has seen Dr. Jacqlyn Larsen since 2016 for stone disease.  At his initial presentation he had a 3 mm left distal ureteral calculus with renal colic which he subsequently passed. He has a left lower pole calculus which is being followed.  He last saw Dr. Jacqlyn Larsen in June 2018 and was asymptomatic.  KUB showed a 7 mm left upper pole renal calculus which was stable.  In the past 12 months he has done well and denies flank/abdominal pain.  He has no bothersome lower urinary tract symptoms.  His PSA/DRE is checked annually by his PCP and his last PSA August 2018 was 0.8.   PMH: Past Medical History:  Diagnosis Date  . Colon polyps   . Complete heart block ([place])    a. 04/2015 in setting of inferior STEMI-->resolved.  . Coronary atherosclerosis    a. CT scan 01/02/2015: extensive coronary atherosclerosis;  b. 04/2015 Inf STEMI/Cath/CABG x 4: LIMA->LAD, VG->Diag, VG->OM2, VG->RPL.  . Ischemic cardiomyopathy    a. 04/2015 LV gram: EF 35-40%.  . Kidney stones    a. May 2016.  . MI (myocardial infarction) ([place]) 04/2015  . Postoperative atrial fibrillation ([place])    a. 04/2015 Post-op CABG AF/AFlutter--> converted with rapid atrial pacing and amio; b. CHA2DS2VASc = 3->Coumadin.  . 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 CARDIAC CATHETERIZATION N/A 05/03/2015   Procedure: Left Heart Cath and Coronary Angiography;  Surgeon: Wellington Hampshire, MD;  Location: [facility];  Service: Cardiovascular;  Laterality: N/A;  . CATARACT EXTRACTION, BILATERAL    . CORONARY ARTERY BYPASS  GRAFT N/A 05/03/2015   Procedure: CORONARY ARTERY BYPASS GRAFTING (CABG) times four using left internal mammary artery and right saphenous vein.;  Surgeon: [name], MD;  Location: [facility];  Service: Open Heart Surgery;  Laterality: N/A;  . HERNIA REPAIR     ventral hernia repair  . JOINT REPLACEMENT    . KNEE SURGERY     arthroscopic knee surgery  . NECK SURGERY     Spinal fusion- neck. Dr. Sherley Bounds of [facility])  . right hip replacement Right 02/04/2014   Dr. Sabra Heck  . TEE WITHOUT CARDIOVERSION N/A 05/03/2015   Procedure: TRANSESOPHAGEAL ECHOCARDIOGRAM (TEE);  Surgeon: [name], MD;  Location: [facility];  Service: Open Heart Surgery;  Laterality: N/A;    Home Medications:  Allergies as of 02/21/2018      Reactions   Lovastatin Other (See Comments)   Chest pain   Pravastatin Other (See Comments)   Nose bleed      Medication List        Accurate as of 02/21/18  2:56 PM. Always use your most recent med list.          aspirin 81 MG EC tablet Take 1 tablet (81 mg total) by mouth daily.   atorvastatin 80 MG tablet Commonly known as:  LIPITOR TAKE ONE TABLET BY MOUTH ONCE DAILY AT 6PM   clopidogrel 75 MG tablet Commonly known as:  PLAVIX Take 1 tablet (75 mg total) by  mouth daily.   ENSURE PLUS Liqd Take 237 mLs by mouth daily.   Fish Oil 1200 MG Caps Take 1,200 mg by mouth daily.   lisinopril 20 MG tablet Commonly known as:  PRINIVIL,ZESTRIL Take 1 tablet (20 mg total) by mouth daily.   meloxicam 15 MG tablet Commonly known as:  MOBIC Take 15 mg by mouth daily as needed.   multivitamin tablet Take 1 tablet by mouth daily.   tamsulosin 0.4 MG Caps capsule Commonly known as:  FLOMAX Take 1 capsule (0.4 mg total) by mouth daily.       Allergies:  Allergies  Allergen Reactions  . Lovastatin Other (See Comments)    Chest pain  . Pravastatin Other (See Comments)    Nose bleed    Family History: Family History    Problem Relation Age of Onset  . CAD Father   . Heart attack Father   . Heart Problems Sister        CABG  . Heart attack Brother   . Heart attack Brother   . Heart attack Brother   . Heart attack Brother   . Heart attack Sister   . CAD Maternal Uncle   . CAD Paternal Uncle     Social History:  reports that he has never smoked. He has never used smokeless tobacco. He reports that he drinks alcohol. He reports that he does not use drugs.  ROS: UROLOGY Frequent Urination?: Yes Hard to postpone urination?: No Burning/pain with urination?: No Get up at night to urinate?: Yes Leakage of urine?: Yes Urine stream starts and stops?: Yes Trouble starting stream?: No Do you have to strain to urinate?: No Blood in urine?: No Urinary tract infection?: No Sexually transmitted disease?: No Injury to kidneys or bladder?: No Painful intercourse?: No Weak stream?: No Erection problems?: No Penile pain?: No  Gastrointestinal Nausea?: No Vomiting?: No Indigestion/heartburn?: No Diarrhea?: No Constipation?: No  Constitutional Fever: No Night sweats?: No Weight loss?: No Fatigue?: No  Skin Skin rash/lesions?: No Itching?: No  Eyes Blurred vision?: No Double vision?: No  Ears/Nose/Throat Sore throat?: No Sinus problems?: No  Hematologic/Lymphatic Swollen glands?: No Easy bruising?: No  Cardiovascular Leg swelling?: No Chest pain?: No  Respiratory Cough?: No Shortness of breath?: No  Endocrine Excessive thirst?: No  Musculoskeletal Back pain?: No Joint pain?: Yes  Neurological Headaches?: No Dizziness?: No  Psychologic Depression?: No Anxiety?: No  Physical Exam: BP 119/67   Pulse 76   Ht 5\' 6"  (1.676 m)   Wt 200 lb (90.7 kg)   BMI 32.28 kg/m   Constitutional:  Alert and oriented, No acute distress. HEENT: [place] AT, moist mucus membranes.  Trachea midline, no masses. Cardiovascular: No clubbing, cyanosis, or edema. Respiratory: Normal  respiratory effort, no increased work of breathing. GI: Abdomen is soft, nontender, nondistended, no abdominal masses GU: No CVA tenderness Lymph: No cervical or inguinal lymphadenopathy. Skin: No rashes, bruises or suspicious lesions. Neurologic: Grossly intact, no focal deficits, moving all 4 extremities. Psychiatric: Normal mood and affect.  Laboratory Data:  Urinalysis Dipstick/microscopy negative   Assessment & Plan:   67 year old male with a 7 mm left upper pole renal calculus by history.  He is presently asymptomatic.  A KUB was ordered and he will be notified with the results.  If stable he will continue annual follow-up.  Return in about 1 year (around 02/22/2019) for [place], KUB.   Abbie Sons, [facility] [address] [place],  [place] [address] [phone]

## 2018-02-22 ENCOUNTER — Telehealth: Payer: Self-pay | Admitting: Urology

## 2018-02-22 ENCOUNTER — Telehealth: Payer: Self-pay

## 2018-02-22 NOTE — Telephone Encounter (Signed)
-----   Message from Abbie Sons, MD sent at 02/22/2018 12:58 PM EDT ----- KUB shows stable left renal calculus.

## 2018-02-22 NOTE — Telephone Encounter (Signed)
Left detailed message.   

## 2018-02-22 NOTE — Telephone Encounter (Signed)
Pt returned your call Friday afternoon.  He said he received your message but thought he was supposed to call you back.

## 2018-02-26 NOTE — Telephone Encounter (Signed)
Scheduled for 04/05/18 @ 920 and 10 AM. -MM

## 2018-04-04 DIAGNOSIS — L570 Actinic keratosis: Secondary | ICD-10-CM | POA: Diagnosis not present

## 2018-04-04 DIAGNOSIS — L820 Inflamed seborrheic keratosis: Secondary | ICD-10-CM | POA: Diagnosis not present

## 2018-04-04 DIAGNOSIS — Z85828 Personal history of other malignant neoplasm of skin: Secondary | ICD-10-CM | POA: Diagnosis not present

## 2018-04-04 DIAGNOSIS — L578 Other skin changes due to chronic exposure to nonionizing radiation: Secondary | ICD-10-CM | POA: Diagnosis not present

## 2018-04-04 DIAGNOSIS — L821 Other seborrheic keratosis: Secondary | ICD-10-CM | POA: Diagnosis not present

## 2018-04-05 ENCOUNTER — Ambulatory Visit (INDEPENDENT_AMBULATORY_CARE_PROVIDER_SITE_OTHER): Payer: PPO

## 2018-04-05 ENCOUNTER — Ambulatory Visit (INDEPENDENT_AMBULATORY_CARE_PROVIDER_SITE_OTHER): Payer: PPO | Admitting: Family Medicine

## 2018-04-05 VITALS — BP 108/58 | HR 65 | Temp 97.8°F | Ht 66.0 in | Wt 206.2 lb

## 2018-04-05 DIAGNOSIS — R739 Hyperglycemia, unspecified: Secondary | ICD-10-CM

## 2018-04-05 DIAGNOSIS — E785 Hyperlipidemia, unspecified: Secondary | ICD-10-CM

## 2018-04-05 DIAGNOSIS — Z86010 Personal history of colonic polyps: Secondary | ICD-10-CM

## 2018-04-05 DIAGNOSIS — Z23 Encounter for immunization: Secondary | ICD-10-CM

## 2018-04-05 DIAGNOSIS — Z0000 Encounter for general adult medical examination without abnormal findings: Secondary | ICD-10-CM

## 2018-04-05 DIAGNOSIS — Z125 Encounter for screening for malignant neoplasm of prostate: Secondary | ICD-10-CM

## 2018-04-05 DIAGNOSIS — I2510 Atherosclerotic heart disease of native coronary artery without angina pectoris: Secondary | ICD-10-CM | POA: Diagnosis not present

## 2018-04-05 DIAGNOSIS — I10 Essential (primary) hypertension: Secondary | ICD-10-CM

## 2018-04-05 NOTE — Patient Instructions (Addendum)
[name] , Thank you for taking time to come for your Medicare Wellness Visit. I appreciate your ongoing commitment to your health goals. Please review the following plan we discussed and let me know if I can assist you in the future.   Screening recommendations/referrals: Colonoscopy: Pt declined referral today.  Recommended yearly ophthalmology/optometry visit for glaucoma screening and checkup Recommended yearly dental visit for hygiene and checkup  Vaccinations: Influenza vaccine: N/A Pneumococcal vaccine: Up to date Tdap vaccine: Up to date Shingles vaccine: Pt declines today.     Advanced directives: Advance directive discussed with you today. Even though you declined this today please call our office should you change your mind and we can give you the proper paperwork for you to fill out.  Conditions/risks identified: Obesity- recommend cutting back on sweets and start substituting with fruits or healthier alternatives.   Next appointment: 10:00 AM today with Dr Caryn Section.   Preventive Care 27 Years and Older, Male Preventive care refers to lifestyle choices and visits with your health care provider that can promote health and wellness. What does preventive care include?  A yearly physical exam. This is also called an annual well check.  Dental exams once or twice a year.  Routine eye exams. Ask your health care provider how often you should have your eyes checked.  Personal lifestyle choices, including:  Daily care of your teeth and gums.  Regular physical activity.  Eating a healthy diet.  Avoiding tobacco and drug use.  Limiting alcohol use.  Practicing safe sex.  Taking low doses of aspirin every day.  Taking vitamin and mineral supplements as recommended by your health care provider. What happens during an annual well check? The services and screenings done by your health care provider during your annual well check will depend on your age, overall health,  lifestyle risk factors, and family history of disease. Counseling  Your health care provider may ask you questions about your:  Alcohol use.  Tobacco use.  Drug use.  Emotional well-being.  Home and relationship well-being.  Sexual activity.  Eating habits.  History of falls.  Memory and ability to understand (cognition).  Work and work Statistician. Screening  You may have the following tests or measurements:  Height, weight, and BMI.  Blood pressure.  Lipid and cholesterol levels. These may be checked every 5 years, or more frequently if you are over 53 years old.  Skin check.  Lung cancer screening. You may have this screening every year starting at age 78 if you have a 30-pack-year history of smoking and currently smoke or have quit within the past 15 years.  Fecal occult blood test (FOBT) of the stool. You may have this test every year starting at age 1.  Flexible sigmoidoscopy or colonoscopy. You may have a sigmoidoscopy every 5 years or a colonoscopy every 10 years starting at age 67.  Prostate cancer screening. Recommendations will vary depending on your family history and other risks.  Hepatitis C blood test.  Hepatitis B blood test.  Sexually transmitted disease (STD) testing.  Diabetes screening. This is done by checking your blood sugar (glucose) after you have not eaten for a while (fasting). You may have this done every 1-3 years.  Abdominal aortic aneurysm (AAA) screening. You may need this if you are a current or former smoker.  Osteoporosis. You may be screened starting at age 83 if you are at high risk. Talk with your health care provider about your test results, treatment options,  and if necessary, the need for more tests. Vaccines  Your health care provider may recommend certain vaccines, such as:  Influenza vaccine. This is recommended every year.  Tetanus, diphtheria, and acellular pertussis (Tdap, Td) vaccine. You may need a Td booster  every 10 years.  Zoster vaccine. You may need this after age 40.  Pneumococcal 13-valent conjugate (PCV13) vaccine. One dose is recommended after age 64.  Pneumococcal polysaccharide (PPSV23) vaccine. One dose is recommended after age 24. Talk to your health care provider about which screenings and vaccines you need and how often you need them. This information is not intended to replace advice given to you by your health care provider. Make sure you discuss any questions you have with your health care provider. Document Released: 08/20/2015 Document Revised: 04/12/2016 Document Reviewed: 05/25/2015 Elsevier Interactive Patient Education  2017 [place] Prevention in the Home Falls can cause injuries. They can happen to people of all ages. There are many things you can do to make your home safe and to help prevent falls. What can I do on the outside of my home?  Regularly fix the edges of walkways and driveways and fix any cracks.  Remove anything that might make you trip as you walk through a door, such as a raised step or threshold.  Trim any bushes or trees on the path to your home.  Use bright outdoor lighting.  Clear any walking paths of anything that might make someone trip, such as rocks or tools.  Regularly check to see if handrails are loose or broken. Make sure that both sides of any steps have handrails.  Any raised decks and porches should have guardrails on the edges.  Have any leaves, snow, or ice cleared regularly.  Use sand or salt on walking paths during winter.  Clean up any spills in your garage right away. This includes oil or grease spills. What can I do in the bathroom?  Use night lights.  Install grab bars by the toilet and in the tub and shower. Do not use towel bars as grab bars.  Use non-skid mats or decals in the tub or shower.  If you need to sit down in the shower, use a plastic, non-slip stool.  Keep the floor dry. Clean up any  water that spills on the floor as soon as it happens.  Remove soap buildup in the tub or shower regularly.  Attach bath mats securely with double-sided non-slip rug tape.  Do not have throw rugs and other things on the floor that can make you trip. What can I do in the bedroom?  Use night lights.  Make sure that you have a light by your bed that is easy to reach.  Do not use any sheets or blankets that are too big for your bed. They should not hang down onto the floor.  Have a firm chair that has side arms. You can use this for support while you get dressed.  Do not have throw rugs and other things on the floor that can make you trip. What can I do in the kitchen?  Clean up any spills right away.  Avoid walking on wet floors.  Keep items that you use a lot in easy-to-reach places.  If you need to reach something above you, use a strong step stool that has a grab bar.  Keep electrical cords out of the way.  Do not use floor polish or wax that makes floors slippery. If  you must use wax, use non-skid floor wax.  Do not have throw rugs and other things on the floor that can make you trip. What can I do with my stairs?  Do not leave any items on the stairs.  Make sure that there are handrails on both sides of the stairs and use them. Fix handrails that are broken or loose. Make sure that handrails are as long as the stairways.  Check any carpeting to make sure that it is firmly attached to the stairs. Fix any carpet that is loose or worn.  Avoid having throw rugs at the top or bottom of the stairs. If you do have throw rugs, attach them to the floor with carpet tape.  Make sure that you have a light switch at the top of the stairs and the bottom of the stairs. If you do not have them, ask someone to add them for you. What else can I do to help prevent falls?  Wear shoes that:  Do not have high heels.  Have rubber bottoms.  Are comfortable and fit you well.  Are closed  at the toe. Do not wear sandals.  If you use a stepladder:  Make sure that it is fully opened. Do not climb a closed stepladder.  Make sure that both sides of the stepladder are locked into place.  Ask someone to hold it for you, if possible.  Clearly mark and make sure that you can see:  Any grab bars or handrails.  First and last steps.  Where the edge of each step is.  Use tools that help you move around (mobility aids) if they are needed. These include:  Canes.  Walkers.  Scooters.  Crutches.  Turn on the lights when you go into a dark area. Replace any light bulbs as soon as they burn out.  Set up your furniture so you have a clear path. Avoid moving your furniture around.  If any of your floors are uneven, fix them.  If there are any pets around you, be aware of where they are.  Review your medicines with your doctor. Some medicines can make you feel dizzy. This can increase your chance of falling. Ask your doctor what other things that you can do to help prevent falls. This information is not intended to replace advice given to you by your health care provider. Make sure you discuss any questions you have with your health care provider. Document Released: 05/20/2009 Document Revised: 12/30/2015 Document Reviewed: 08/28/2014 Elsevier Interactive Patient Education  2017 [employer].

## 2018-04-05 NOTE — Progress Notes (Signed)
Subjective:   [name] is a 67 y.o. male who presents for Medicare Annual/Subsequent preventive examination.  Review of Systems:  N/A  Cardiac Risk Factors include: advanced age (>6men, >84 women);dyslipidemia;hypertension;male gender;obesity (BMI >30kg/m2)     Objective:    Vitals: BP (!) 108/58 (BP Location: Right Arm)   Pulse 65   Temp 97.8 F (36.6 C) (Oral)   Ht 5\' 6"  (1.676 m)   Wt 206 lb 3.2 oz (93.5 kg)   BMI 33.28 kg/m   Body mass index is 33.28 kg/m.  Advanced Directives 04/05/2018 02/23/2017 06/01/2015 05/04/2015 05/03/2015 02/06/2015 01/01/2015  Does Patient Have a Medical Advance Directive? No No Yes;No No No No No  Does patient want to make changes to medical advance directive? - - Yes - information given - - - -  Would patient like information on creating a medical advance directive? No - Patient declined No - Patient declined Yes - Scientist, clinical (histocompatibility and immunogenetics) given No - patient declined information No - patient declined information No - patient declined information Yes Higher education careers adviser given    Tobacco Social History   Tobacco Use  Smoking Status Never Smoker  Smokeless Tobacco Never Used     Counseling given: Not Answered   Clinical Intake:  Pre-visit preparation completed: Yes  Pain : No/denies pain Pain Score: 0-No pain     Nutritional Status: BMI > 30  Obese Nutritional Risks: None Diabetes: No  How often do you need to have someone help you when you read instructions, pamphlets, or other written materials from your doctor or pharmacy?: 1 - Never  Interpreter Needed?: No  Information entered by :: [facility], LPN  Past Medical History:  Diagnosis Date  . Colon polyps   . Complete heart block ([place])    a. 04/2015 in setting of inferior STEMI-->resolved.  . Coronary atherosclerosis    a. CT scan 01/02/2015: extensive coronary atherosclerosis;  b. 04/2015 Inf STEMI/Cath/CABG x 4: LIMA->LAD, VG->Diag, VG->OM2, VG->RPL.  Marland Kitchen Hyperlipidemia    . Hypertension   . Ischemic cardiomyopathy    a. 04/2015 LV gram: EF 35-40%.  . Kidney stones    a. May 2016.  . MI (myocardial infarction) ([place]) 04/2015  . Postoperative atrial fibrillation ([place])    a. 04/2015 Post-op CABG AF/AFlutter--> converted with rapid atrial pacing and amio; b. CHA2DS2VASc = 3->Coumadin.  . 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 CARDIAC CATHETERIZATION N/A 05/03/2015   Procedure: Left Heart Cath and Coronary Angiography;  Surgeon: Wellington Hampshire, MD;  Location: [facility] [place] [facility];  Service: Cardiovascular;  Laterality: N/A;  . CATARACT EXTRACTION, BILATERAL    . CORONARY ARTERY BYPASS GRAFT N/A 05/03/2015   Procedure: CORONARY ARTERY BYPASS GRAFTING (CABG) times four using left internal mammary artery and right saphenous vein.;  Surgeon: [name], MD;  Location: [facility];  Service: Open Heart Surgery;  Laterality: N/A;  . HERNIA REPAIR     ventral hernia repair  . JOINT REPLACEMENT    . KNEE SURGERY     arthroscopic knee surgery  . NECK SURGERY     Spinal fusion- neck. Dr. Sherley Bounds of [facility])  . right hip replacement Right 02/04/2014   Dr. Sabra Heck  . TEE WITHOUT CARDIOVERSION N/A 05/03/2015   Procedure: TRANSESOPHAGEAL ECHOCARDIOGRAM (TEE);  Surgeon: [name], MD;  Location: [facility];  Service: Open Heart Surgery;  Laterality: N/A;   Family History  Problem Relation Age  of Onset  . CAD Father   . Heart attack Father   . Heart Problems Sister        CABG  . Heart attack Brother   . Heart attack Brother   . Heart attack Brother   . Heart attack Brother   . Heart attack Sister   . CAD Maternal Uncle   . CAD Paternal Uncle    Social History   Socioeconomic History  . Marital status: Married    Spouse name: Not on file  . Number of children: 1  . Years of education: Not on file  . Highest education level: Associate degree: occupational, Hotel manager, or vocational  program  Occupational History  . Occupation: Truck [employer] operational officer  . Financial resource strain: Not hard at all  . Food insecurity:    Worry: Never true    Inability: Never true  . Transportation needs:    Medical: No    Non-medical: No  Tobacco Use  . Smoking status: Never Smoker  . Smokeless tobacco: Never Used  Substance and Sexual Activity  . Alcohol use: Not Currently    Alcohol/week: 0.0 standard drinks  . Drug use: No  . Sexual activity: Yes    Birth control/protection: None  Lifestyle  . Physical activity:    Days per week: Not on file    Minutes per session: Not on file  . Stress: Not at all  Relationships  . Social connections:    Talks on phone: Not on file    Gets together: Not on file    Attends religious service: Not on file    Active member of club or organization: Not on file    Attends meetings of clubs or organizations: Not on file    Relationship status: Not on file  Other Topics Concern  . Not on file  Social History Narrative  . Not on file    Outpatient Encounter Medications as of 04/05/2018  Medication Sig  . aspirin 81 MG EC tablet Take 1 tablet (81 mg total) by mouth daily.  Marland Kitchen atorvastatin (LIPITOR) 80 MG tablet TAKE ONE TABLET BY MOUTH ONCE DAILY AT 6PM  . ENSURE PLUS (ENSURE PLUS) LIQD Take 237 mLs by mouth daily.  Marland Kitchen lisinopril (PRINIVIL,ZESTRIL) 20 MG tablet Take 1 tablet (20 mg total) by mouth daily.  . meloxicam (MOBIC) 15 MG tablet Take 15 mg by mouth daily as needed.   . Multiple Vitamin (MULTIVITAMIN) tablet Take 1 tablet by mouth daily.  . Omega-3 Fatty Acids (FISH OIL) 1200 MG CAPS Take 1,200 mg by mouth daily.  . tamsulosin (FLOMAX) 0.4 MG CAPS capsule Take 1 capsule (0.4 mg total) by mouth daily. (Patient taking differently: Take 0.4 mg by mouth as needed. )  . clopidogrel (PLAVIX) 75 MG tablet Take 1 tablet (75 mg total) by mouth daily. (Patient not taking: Reported on 04/05/2018)   No facility-administered encounter  medications on file as of 04/05/2018.     Activities of Daily Living In your present state of health, do you have any difficulty performing the following activities: 04/05/2018  Hearing? N  Vision? N  Difficulty concentrating or making decisions? N  Walking or climbing stairs? Y  Comment Due to left knee pain/arthritis.   Dressing or bathing? N  Doing errands, shopping? N  Preparing Food and eating ? N  Using the Toilet? N  In the past six months, have you accidently leaked urine? Y  Comment Occasionally, if waits too long before emptying  bladder.   Do you have problems with loss of bowel control? N  Managing your Medications? N  Managing your Finances? N  Housekeeping or managing your Housekeeping? N  Some recent data might be hidden    Patient Care Team: Birdie Sons, MD as PCP - General (Family Medicine) Wellington Hampshire, MD as Consulting Physician (Cardiology) Earnestine Leys, MD as Consulting Physician (Specialist) Abbie Sons, MD (Urology)   Assessment:   This is a routine wellness examination for [name].  Exercise Activities and Dietary recommendations Current Exercise Habits: The patient does not participate in regular exercise at present, Exercise limited by: Other - see comments(Does a lot of walking with job and at home. )  Goals    . DIET - REDUCE SUGAR INTAKE     Recommend cutting back on sweets and start substituting with fruits or healthier alternatives.     . Increase water intake     Recommend drinking 4-6 glasses of water a day.        Fall Risk Fall Risk  04/05/2018 02/23/2017 09/15/2015 06/01/2015  Falls in the past year? No Yes No No  Number falls in past yr: - 1 - -  Comment - tripped - -  Injury with Fall? - No - -  Follow up - Falls prevention discussed - -   Is the patient's home free of loose throw rugs in walkways, pet beds, electrical cords, etc?   yes      Grab bars in the bathroom? yes      Handrails on the stairs?   no       Adequate lighting?   yes  Timed Get Up and Go Performed: N/A  Depression Screen PHQ 2/9 Scores 04/05/2018 04/05/2018 02/23/2017 02/23/2017  PHQ - 2 Score 0 0 0 0  PHQ- 9 Score 1 - 3 -    Cognitive Function: Pt declined screening today.      6CIT Screen 02/23/2017  What Year? 0 points  What month? 0 points  What time? 0 points  Count back from 20 0 points  Months in reverse 0 points  Repeat phrase 0 points  Total Score 0    Immunization History  Administered Date(s) Administered  . Influenza, High Dose Seasonal PF 06/02/2017  . Influenza-Unspecified 05/07/2015  . Pneumococcal Conjugate-13 09/15/2015  . Pneumococcal Polysaccharide-23 02/23/2017  . Tdap 06/19/2008  . Zoster 09/15/2015    Qualifies for Shingles Vaccine? Due for Shingles vaccine. Declined my offer to administer today. Education has been provided regarding the importance of this vaccine. Pt has been advised to call her insurance company to determine her out of pocket expense. Advised she may also receive this vaccine at her local pharmacy or Health Dept. Verbalized acceptance and understanding.  Screening Tests Health Maintenance  Topic Date Due  . COLONOSCOPY  01/13/2016  . INFLUENZA VACCINE  03/07/2018  . TETANUS/TDAP  06/19/2018  . Hepatitis C Screening  Completed  . PNA vac Low Risk Adult  Completed   Cancer Screenings: Lung: Low Dose CT Chest recommended if Age 68-80 years, 30 pack-year currently smoking OR have quit w/in 15years. Patient does not qualify. Colorectal: Pt declines referral today.   Additional Screenings:  Hepatitis C Screening: Up to date      Plan:  I have personally reviewed and addressed the Medicare Annual Wellness questionnaire and have noted the following in the patient's chart:  A. Medical and social history B. Use of alcohol, tobacco or illicit drugs  C.  Current medications and supplements D. Functional ability and status E.  Nutritional status F.  Physical  activity G. Advance directives H. List of other physicians I.  Hospitalizations, surgeries, and ER visits in previous 12 months J.  [place] such as hearing and vision if needed, cognitive and depression L. Referrals and appointments - none  In addition, I have reviewed and discussed with patient certain preventive protocols, quality metrics, and best practice recommendations. A written personalized care plan for preventive services as well as general preventive health recommendations were provided to patient.  See attached scanned questionnaire for additional information.   Signed,  Fabio Neighbors, LPN Nurse Health Advisor   Nurse Recommendations: Pt declined a colonoscopy and cologuard referral today.

## 2018-04-05 NOTE — Progress Notes (Signed)
Patient: [name], Male    DOB: 03-02-1951, 67 y.o.   MRN: [id number] Visit Date: 04/05/2018  Today's Provider: [name], MD   Chief Complaint  Patient presents with  . Annual Exam  . Hypertension  . Hyperlipidemia  . Hyperglycemia   Subjective:   Patient saw McKenzie for AWV today at 9:20 am.   Complete Physical [name] is a 67 y.o. male. He feels fairly well. He reports no regular exercising. He reports he is sleeping fairly well.He sees Dr. Nehemiah Massed regularly for skin exam and had several lesions frozen recently.   -----------------------------------------------------------   Hypertension, follow-up:  BP Readings from Last 3 Encounters:  04/05/18 (!) 108/58  02/21/18 119/67  03/16/17 140/76    He was last seen for hypertension 1 years ago.  BP at that visit was 116/64. Management since that visit includes; no changes.He reports good compliance with treatment. He is not having side effects.  He is not exercising. He is adherent to low salt diet.   Outside blood pressures are not being checked. He is experiencing none.  Patient denies chest pain, chest pressure/discomfort, claudication, dyspnea, exertional chest pressure/discomfort, fatigue, irregular heart beat, lower extremity edema, near-syncope, orthopnea, palpitations, paroxysmal nocturnal dyspnea, syncope and tachypnea.   Cardiovascular risk factors include advanced age (older than 22 for men, 47 for women), dyslipidemia, hypertension and male gender.  Use of agents associated with hypertension: NSAIDS.   ------------------------------------------------------------------------    Lipid/Cholesterol, Follow-up:   Last seen for this 1 years ago.  Management since that visit includes; labs checked, no changes.  Last Lipid Panel:    Component Value Date/Time   CHOL 136 03/09/2017 1058   TRIG 294 (H) 03/09/2017 1058   HDL 25 (L) 03/09/2017 1058   CHOLHDL 5.4 (H) 03/09/2017 1058     CHOLHDL 7.7 05/03/2015 0921   VLDL 32 05/03/2015 0921   LDLCALC 52 03/09/2017 1058    He reports good compliance with treatment. He is not having side effects.   Wt Readings from Last 3 Encounters:  04/05/18 206 lb 3.2 oz (93.5 kg)  02/21/18 200 lb (90.7 kg)  03/16/17 207 lb 4 oz (94 kg)    ------------------------------------------------------------------------  Atherosclerosis of native coronary artery of native heart without angina pectoris From 03/09/2017-no changes. Continues follow up Dr. Fletcher Anon which he states is due in November. He denies having any chest pains or dyspnea.   Hyperglycemia From 03/09/2017-labs checked. Patient was advised to cut back on sweets and starchy foods. Hemoglobin A1c was 6.3. Patient reports that he has cut back on sweets and starchy foods.   History of tubular adenoma.  He had excision of fragments of tubular adenoma in 2012. He was referred for colonoscopy in 2017, but procedure was apparently cancelled because he was not advised to stop his blood thinners before the date it was scheduled.    Review of Systems  Constitutional: Negative for appetite change, chills, fatigue and fever.  HENT: Negative for congestion, ear pain, hearing loss, nosebleeds and trouble swallowing.   Eyes: Negative for pain and visual disturbance.  Respiratory: Negative for cough, chest tightness and shortness of breath.   Cardiovascular: Negative for chest pain, palpitations and leg swelling.  Gastrointestinal: Negative for abdominal pain, blood in stool, constipation, diarrhea, nausea and vomiting.  Endocrine: Negative for polydipsia, polyphagia and polyuria.  Genitourinary: Positive for frequency. Negative for dysuria and flank pain.  Musculoskeletal: Positive for arthralgias. Negative for back pain, joint swelling, myalgias  and neck stiffness.  Skin: Negative for color change, rash and wound.  Neurological: Positive for light-headedness. Negative for dizziness,  tremors, seizures, speech difficulty, weakness and headaches.  Psychiatric/Behavioral: Negative for behavioral problems, confusion, decreased concentration, dysphoric mood and sleep disturbance. The patient is not nervous/anxious.   All other systems reviewed and are negative.   Social History   Socioeconomic History  . Marital status: Married    Spouse name: Not on file  . Number of children: 1  . Years of education: Not on file  . Highest education level: Associate degree: occupational, Hotel manager, or vocational program  Occupational History  . Occupation: Truck [employer] operational officer  . Financial resource strain: Not hard at all  . Food insecurity:    Worry: Never true    Inability: Never true  . Transportation needs:    Medical: No    Non-medical: No  Tobacco Use  . Smoking status: Never Smoker  . Smokeless tobacco: Never Used  Substance and Sexual Activity  . Alcohol use: Not Currently    Alcohol/week: 0.0 standard drinks  . Drug use: No  . Sexual activity: Yes    Birth control/protection: None  Lifestyle  . Physical activity:    Days per week: Not on file    Minutes per session: Not on file  . Stress: Not at all  Relationships  . Social connections:    Talks on phone: Not on file    Gets together: Not on file    Attends religious service: Not on file    Active member of club or organization: Not on file    Attends meetings of clubs or organizations: Not on file    Relationship status: Not on file  . Intimate partner violence:    Fear of current or ex partner: Not on file    Emotionally abused: Not on file    Physically abused: Not on file    Forced sexual activity: Not on file  Other Topics Concern  . Not on file  Social History Narrative  . Not on file    Past Medical History:  Diagnosis Date  . Colon polyps   . Complete heart block ([place])    a. 04/2015 in setting of inferior STEMI-->resolved.  . Coronary atherosclerosis    a. CT scan 01/02/2015:  extensive coronary atherosclerosis;  b. 04/2015 Inf STEMI/Cath/CABG x 4: LIMA->LAD, VG->Diag, VG->OM2, VG->RPL.  Marland Kitchen Hyperlipidemia   . Hypertension   . Ischemic cardiomyopathy    a. 04/2015 LV gram: EF 35-40%.  . Kidney stones    a. May 2016.  . MI (myocardial infarction) ([place]) 04/2015  . Postoperative atrial fibrillation ([place])    a. 04/2015 Post-op CABG AF/AFlutter--> converted with rapid atrial pacing and amio; b. CHA2DS2VASc = 3->Coumadin.  . 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 History of adenomatous polyp of colon 09/15/2015  . 1st degree AV block 09/14/2015  . Sciatica of right side 09/14/2015  . Bilateral kidney stones 09/14/2015  . Fam hx-ischem heart disease 09/14/2015  . Episodic lightheadedness 09/14/2015  . History of complete heart block   . Postoperative atrial fibrillation ([place])   . HLD (hyperlipidemia)   . HTN (hypertension)   . Coronary atherosclerosis   . History of acute inferior wall MI 05/03/2015  . 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 CARDIAC CATHETERIZATION N/A 05/03/2015  Procedure: Left Heart Cath and Coronary Angiography;  Surgeon: Wellington Hampshire, MD;  Location: [place] [facility];  Service: Cardiovascular;  Laterality: N/A;  . CATARACT EXTRACTION, BILATERAL    . CORONARY ARTERY BYPASS GRAFT N/A 05/03/2015   Procedure: CORONARY ARTERY BYPASS GRAFTING (CABG) times four using left internal mammary artery and right saphenous vein.;  Surgeon: [name], MD;  Location: [place];  Service: Open Heart Surgery;  Laterality: N/A;  . HERNIA REPAIR     ventral hernia repair  . JOINT REPLACEMENT    . KNEE SURGERY     arthroscopic knee surgery  . NECK SURGERY     Spinal fusion- neck. Dr. Sherley Bounds of [facility])  . right hip replacement Right 02/04/2014   Dr. Sabra Heck  . TEE WITHOUT CARDIOVERSION N/A 05/03/2015   Procedure: TRANSESOPHAGEAL  ECHOCARDIOGRAM (TEE);  Surgeon: [name], MD;  Location: [place];  Service: Open Heart Surgery;  Laterality: N/A;    His family history includes CAD in his father, maternal uncle, and paternal uncle; Heart Problems in his sister; Heart attack in his brother, brother, brother, brother, father, and sister.      Current Outpatient Medications:  .  aspirin 81 MG EC tablet, Take 1 tablet (81 mg total) by mouth daily., Disp: , Rfl:  .  atorvastatin (LIPITOR) 80 MG tablet, TAKE ONE TABLET BY MOUTH ONCE DAILY AT 6PM, Disp: 90 tablet, Rfl: 3 .  clopidogrel (PLAVIX) 75 MG tablet, Take 1 tablet (75 mg total) by mouth daily. (Patient not taking: Reported on 04/05/2018), Disp: 90 tablet, Rfl: 3 .  ENSURE PLUS (ENSURE PLUS) LIQD, Take 237 mLs by mouth daily., Disp: , Rfl:  .  lisinopril (PRINIVIL,ZESTRIL) 20 MG tablet, Take 1 tablet (20 mg total) by mouth daily., Disp: 90 tablet, Rfl: 3 .  meloxicam (MOBIC) 15 MG tablet, Take 15 mg by mouth daily as needed. , Disp: , Rfl:  .  Multiple Vitamin (MULTIVITAMIN) tablet, Take 1 tablet by mouth daily., Disp: , Rfl:  .  Omega-3 Fatty Acids (FISH OIL) 1200 MG CAPS, Take 1,200 mg by mouth daily., Disp: , Rfl:  .  tamsulosin (FLOMAX) 0.4 MG CAPS capsule, Take 1 capsule (0.4 mg total) by mouth daily. (Patient taking differently: Take 0.4 mg by mouth as needed. ), Disp: 30 capsule, Rfl: 0  Patient Care Team: Birdie Sons, MD as PCP - General (Family Medicine) Wellington Hampshire, MD as Consulting Physician (Cardiology) Earnestine Leys, MD as Consulting Physician (Specialist) Abbie Sons, MD (Urology)     Objective:   Vitals: There were no vitals taken for this visit.  BP 108/58 (BP Location: Right Arm)   Pulse 65   Temp 97.8 F (36.6 C) (Oral)   Ht 5\' 6"  (1.676 m)   Wt 206 lb 3.2 oz (93.5 kg)   BMI 33.28 kg/m   BSA 2.09 m   Pain [place] 0-No pain Physical Exam   General Appearance:    Alert, cooperative, no distress, appears stated age,  overweight.   Head:    Normocephalic, without obvious abnormality, atraumatic  Eyes:    PERRL, conjunctiva/corneas clear, EOM's intact, fundi    benign, both eyes       Ears:    Normal TM's and external ear canals, both ears  Nose:   Nares normal, septum midline, mucosa normal, no drainage   or sinus tenderness  Throat:   Lips, mucosa, and tongue normal; teeth and gums normal  Neck:  Supple, symmetrical, trachea midline, no adenopathy;       thyroid:  No enlargement/tenderness/nodules; no carotid   bruit or JVD  Back:     Symmetric, no curvature, ROM normal, no CVA tenderness  Lungs:     Clear to auscultation bilaterally, respirations unlabored  Chest wall:    No tenderness or deformity  Heart:    Regular rate and rhythm, S1 and S2 normal, no murmur, rub   or gallop  Abdomen:     Soft, non-tender, bowel sounds active all four quadrants,    no masses, no organomegaly  Genitalia:    deferred  Rectal:    deferred  Extremities:   Extremities normal, atraumatic, no cyanosis or edema  Pulses:   2+ and symmetric all extremities  Skin:   Skin color, texture, turgor normal, no rashes or lesions  Lymph nodes:   Cervical, supraclavicular, and axillary nodes normal  Neurologic:   CNII-XII intact. Normal strength, sensation and reflexes      throughout    Activities of Daily Living In your present state of health, do you have any difficulty performing the following activities: 04/05/2018  Hearing? N  Vision? N  Difficulty concentrating or making decisions? N  Walking or climbing stairs? Y  Comment Due to left knee pain/arthritis.   Dressing or bathing? N  Doing errands, shopping? N  Preparing Food and eating ? N  Using the Toilet? N  In the past six months, have you accidently leaked urine? Y  Comment Occasionally, if waits too long before emptying bladder.   Do you have problems with loss of bowel control? N  Managing your Medications? N  Managing your Finances? N  Housekeeping or  managing your Housekeeping? N  Some recent data might be hidden    Fall Risk Assessment Fall Risk  04/05/2018 02/23/2017 09/15/2015 06/01/2015  Falls in the past year? No Yes No No  Number falls in past yr: - 1 - -  Comment - tripped - -  Injury with Fall? - No - -  Follow up - Falls prevention discussed - -     Depression Screen PHQ 2/9 Scores 04/05/2018 04/05/2018 02/23/2017 02/23/2017  PHQ - 2 Score 0 0 0 0  PHQ- 9 Score 1 - 3 -    Audit-C Alcohol Use Screening   Alcohol Use Disorder Test (AUDIT) 04/05/2018  1. How often do you have a drink containing alcohol? 0  2. How many drinks containing alcohol do you have on a typical day when you are drinking? 0  3. How often do you have six or more drinks on one occasion? 0  AUDIT-C Score 0  Intervention/Follow-up AUDIT Score <7 follow-up not indicated    A score of 3 or more in women, and 4 or more in men indicates increased risk for alcohol abuse, EXCEPT if all of the points are from question 1    Assessment & Plan:    Annual Physical Reviewed patient's Family Medical History Reviewed and updated list of patient's medical providers Assessment of cognitive impairment was done Assessed patient's functional ability Established a written schedule for health screening [place] Completed and Reviewed  Exercise Activities and Dietary recommendations Goals    . DIET - REDUCE SUGAR INTAKE     Recommend cutting back on sweets and start substituting with fruits or healthier alternatives.     . Increase water intake     Recommend drinking 4-6 glasses of water a day.  Immunization History  Administered Date(s) Administered  . Influenza, High Dose Seasonal PF 06/02/2017  . Influenza-Unspecified 05/07/2015  . Pneumococcal Conjugate-13 09/15/2015  . Pneumococcal Polysaccharide-23 02/23/2017  . Tdap 06/19/2008  . Zoster 09/15/2015    Health Maintenance  Topic Date Due  . COLONOSCOPY  01/13/2016  .  INFLUENZA VACCINE  03/08/2019 (Originally 03/07/2018)  . TETANUS/TDAP  06/19/2018  . Hepatitis C Screening  Completed  . PNA vac Low Risk Adult  Completed     Discussed health benefits of physical activity, and encouraged him to engage in regular exercise appropriate for his age and condition.    ------------------------------------------------------------------------------------------------------------  1. Annual physical exam   2. Hyperlipidemia, unspecified hyperlipidemia type He is tolerating atorvastatin well with no adverse effects.   - Comprehensive metabolic panel - Lipid panel - CBC  3. Atherosclerosis of native coronary artery of native heart without angina pectoris Asymptomatic. Compliant with medication.  Continue aggressive risk factor modification.  Follow up Dr. Fletcher Anon as scheduled.   4. Essential hypertension Well controlled.  Continue current medications.    5. Prostate cancer screening  - PSA  6. History of adenomatous polyp of colon Due for follow up colonoscopy.  - Ambulatory referral to Gastroenterology  7. Hyperglycemia  - Hemoglobin A1c  8. Need for influenza vaccination  - Flu vaccine HIGH DOSE PF (Fluzone High dose)    [name], MD  [facility]

## 2018-04-06 LAB — HEMOGLOBIN A1C
Est. average glucose Bld gHb Est-mCnc: 131 mg/dL
Hgb A1c MFr Bld: 6.2 % — ABNORMAL HIGH (ref 4.8–5.6)

## 2018-04-06 LAB — CBC
Hematocrit: 40.9 % (ref 37.5–51.0)
Hemoglobin: 13.5 g/dL (ref 13.0–17.7)
MCH: 27.2 pg (ref 26.6–33.0)
MCHC: 33 g/dL (ref 31.5–35.7)
MCV: 83 fL (ref 79–97)
PLATELETS: 225 10*3/uL (ref 150–450)
RBC: 4.96 x10E6/uL (ref 4.14–5.80)
RDW: 14.5 % (ref 12.3–15.4)
WBC: 8 10*3/uL (ref 3.4–10.8)

## 2018-04-06 LAB — COMPREHENSIVE METABOLIC PANEL
A/G RATIO: 1.7 (ref 1.2–2.2)
ALT: 25 IU/L (ref 0–44)
AST: 18 IU/L (ref 0–40)
Albumin: 4 g/dL (ref 3.6–4.8)
Alkaline Phosphatase: 98 IU/L (ref 39–117)
BILIRUBIN TOTAL: 1 mg/dL (ref 0.0–1.2)
BUN/Creatinine Ratio: 18 (ref 10–24)
BUN: 17 mg/dL (ref 8–27)
CALCIUM: 9.2 mg/dL (ref 8.6–10.2)
CHLORIDE: 99 mmol/L (ref 96–106)
CO2: 24 mmol/L (ref 20–29)
Creatinine, Ser: 0.97 mg/dL (ref 0.76–1.27)
GFR, EST AFRICAN AMERICAN: 92 mL/min/{1.73_m2} (ref >59)
GFR, EST NON AFRICAN AMERICAN: 80 mL/min/{1.73_m2} (ref >59)
Globulin, Total: 2.4 g/dL (ref 1.5–4.5)
Glucose: 107 mg/dL — ABNORMAL HIGH (ref 65–99)
POTASSIUM: 4.4 mmol/L (ref 3.5–5.2)
SODIUM: 138 mmol/L (ref 134–144)
TOTAL PROTEIN: 6.4 g/dL (ref 6.0–8.5)

## 2018-04-06 LAB — LIPID PANEL
CHOL/HDL RATIO: 6 ratio — AB (ref 0.0–5.0)
Cholesterol, Total: 151 mg/dL (ref 100–199)
HDL: 25 mg/dL — AB (ref >39)
Triglycerides: 420 mg/dL — ABNORMAL HIGH (ref 0–149)

## 2018-04-06 LAB — PSA: PROSTATE SPECIFIC AG, SERUM: 0.8 ng/mL (ref 0.0–4.0)

## 2018-04-09 ENCOUNTER — Encounter: Payer: Self-pay | Admitting: Family Medicine

## 2018-04-09 ENCOUNTER — Telehealth: Payer: Self-pay

## 2018-04-09 NOTE — Telephone Encounter (Signed)
-----   Message from Birdie Sons, MD sent at 04/09/2018  8:06 AM EDT ----- Triglycerides are high at 420, should be under 150. Cholesterol ok at 150. Average blood sugar is borderline diabetic at 131. Need to strictly avoid sweets and white starches. Continue current medications.  Schedule follow up for pre-diabetes in 6 months.

## 2018-04-09 NOTE — Telephone Encounter (Signed)
Pt advised.  Apt made for 10/17/2018 at 8Am  Thanks,   -Mickel Baas

## 2018-04-22 ENCOUNTER — Encounter: Payer: Self-pay | Admitting: *Deleted

## 2018-05-18 ENCOUNTER — Other Ambulatory Visit: Payer: Self-pay | Admitting: Cardiovascular Disease

## 2018-07-18 ENCOUNTER — Telehealth: Payer: Self-pay | Admitting: Cardiovascular Disease

## 2018-07-18 NOTE — Telephone Encounter (Signed)
DOT clearance form received and placed in nurse box.  Patient has upcoming appt.

## 2018-07-18 NOTE — Telephone Encounter (Signed)
Patient last seen 03/16/17 by Dr. Fletcher Anon. He has a history of CAD s/p MI in 04/2015. CABG x 4 on 05/03/15.  The patient is scheduled to see Ignacia Bayley, NP on 08/01/18 for DOT clearance.   Paperwork obtained from nurse bin. Given to [facility] to hold for upcoming appointment on 12/26.

## 2018-08-01 ENCOUNTER — Ambulatory Visit (INDEPENDENT_AMBULATORY_CARE_PROVIDER_SITE_OTHER): Payer: PPO | Admitting: Nurse Practitioner

## 2018-08-01 ENCOUNTER — Encounter: Payer: Self-pay | Admitting: Nurse Practitioner

## 2018-08-01 VITALS — BP 122/58 | HR 77 | Ht 66.0 in | Wt 199.8 lb

## 2018-08-01 DIAGNOSIS — E785 Hyperlipidemia, unspecified: Secondary | ICD-10-CM

## 2018-08-01 DIAGNOSIS — I2510 Atherosclerotic heart disease of native coronary artery without angina pectoris: Secondary | ICD-10-CM | POA: Diagnosis not present

## 2018-08-01 DIAGNOSIS — I10 Essential (primary) hypertension: Secondary | ICD-10-CM | POA: Diagnosis not present

## 2018-08-01 NOTE — Patient Instructions (Signed)
Medication Instructions:  Your physician recommends that you continue on your current medications as directed. Please refer to the Current Medication list given to you today.  If you need a refill on your cardiac medications before your next appointment, please call your pharmacy.   Lab work: None ordered  If you have labs (blood work) drawn today and your tests are completely normal, you will receive your results only by: Marland Kitchen MyChart Message (if you have MyChart) OR . A paper copy in the mail If you have any lab test that is abnormal or we need to change your treatment, we will call you to review the results.  Testing/Procedures: None ordered   Follow-Up: At [facility], you and your health needs are our priority.  As part of our continuing mission to provide you with exceptional heart care, we have created designated Provider Care Teams.  These Care Teams include your primary Cardiologist (physician) and Advanced Practice Providers (APPs -  Physician Assistants and Nurse Practitioners) who all work together to provide you with the care you need, when you need it. You will need a follow up appointment in 1 years.  Please call our office 2 months in advance to schedule this appointment.  You may see Kathlyn Sacramento, MD or one of the following Advanced Practice Providers on your designated Care Team:   Murray Hodgkins, NP Christell Faith, PA-C . Marrianne Mood, PA-C

## 2018-08-01 NOTE — Progress Notes (Signed)
Office Visit    Patient Name: [name] Date of Encounter: 08/01/2018  Primary Care Provider:  Birdie Sons, MD Primary Cardiologist:  Kathlyn Sacramento, MD  Chief Complaint    67 year old male with a history of CAD status post four-vessel bypass following a inferior STEMI in September 2016, hypertension, and hyperlipidemia, who presents for annual follow-up of his coronary artery disease.  Past Medical History    Past Medical History:  Diagnosis Date  . CAD (coronary artery disease)    a. CT scan 01/02/2015: extensive coronary atherosclerosis;  b. 04/2015 Inf STEMI/Cath/CABG x 4: LIMA->LAD, VG->Diag, VG->OM2, VG->RPL; c. 09/2015 MV: EF 55-65%, infsept/inf infarct w/o ischemia->Low Risk.  . Colon polyps   . Complete heart block ([place])    a. 04/2015 in setting of inferior STEMI-->resolved.  . Hyperlipidemia   . Hypertension   . Ischemic cardiomyopathy    a. 04/2015 LV gram: EF 35-40%; b. 09/2015 MV: EF 55-65%.  . Kidney stones    a. May 2016.  . MI (myocardial infarction) ([place]) 04/2015  . Postoperative atrial fibrillation ([place])    a. 04/2015 Post-op CABG AF/AFlutter--> converted with rapid atrial pacing and amio; b. CHA2DS2VASc = 3->Coumadin.  . 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 CARDIAC CATHETERIZATION N/A 05/03/2015   Procedure: Left Heart Cath and Coronary Angiography;  Surgeon: Wellington Hampshire, MD;  Location: [place] [facility];  Service: Cardiovascular;  Laterality: N/A;  . CATARACT EXTRACTION, BILATERAL    . CORONARY ARTERY BYPASS GRAFT N/A 05/03/2015   Procedure: CORONARY ARTERY BYPASS GRAFTING (CABG) times four using left internal mammary artery and right saphenous vein.;  Surgeon: [name], MD;  Location: [place];  Service: Open Heart Surgery;  Laterality: N/A;  . HERNIA REPAIR     ventral hernia repair  . JOINT REPLACEMENT    . KNEE SURGERY     arthroscopic knee surgery  . NECK SURGERY     Spinal fusion-  neck. Dr. Sherley Bounds of [facility])  . right hip replacement Right 02/04/2014   Dr. Sabra Heck  . TEE WITHOUT CARDIOVERSION N/A 05/03/2015   Procedure: TRANSESOPHAGEAL ECHOCARDIOGRAM (TEE);  Surgeon: [name], MD;  Location: [place];  Service: Open Heart Surgery;  Laterality: N/A;    Allergies  Allergies  Allergen Reactions  . Lovastatin Other (See Comments)    Chest pain  . Pravastatin Other (See Comments)    Nose bleed    History of Present Illness    67 year old male with the above past medical history including coronary artery disease status post inferior STEMI in September 2016, hypertension, and hyperlipidemia.  Catheterization at the time of STEMI revealed severe multivessel disease and was also complicated by complete heart block.  He required four-vessel bypass.  He did have postoperative atrial fibrillation and flutter and was on amiodarone and Coumadin for period of time postoperatively.  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  He was last seen in clinic in August 2018, and since then, has done well.  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  He denies chest pain, palpitations, dyspnea, PND, orthopnea, dizziness, syncope, edema, or early satiety.  He reports compliance and tolerance of all medications.  Home Medications    Prior to Admission medications   Medication Sig Start  Date End Date Taking? Authorizing Provider  aspirin 81 MG EC tablet Take 1 tablet (81 mg total) by mouth daily. 05/17/15  Yes Theora Gianotti, NP  atorvastatin (LIPITOR) 80 MG tablet TAKE ONE TABLET BY MOUTH ONCE DAILY AT 6PM 06/22/17  Yes Theora Gianotti, NP  ENSURE PLUS (ENSURE PLUS) LIQD Take 237 mLs by mouth daily.   Yes [provider]  meloxicam (MOBIC) 15 MG tablet Take 15 mg by mouth daily as  needed.    Yes [provider]  Multiple Vitamin (MULTIVITAMIN) tablet Take 1 tablet by mouth daily.   Yes [provider]  Omega-3 Fatty Acids (FISH OIL) 1200 MG CAPS Take 1,200 mg by mouth daily.   Yes [provider]   Review of Systems    He denies chest pain, palpitations, dyspnea, pnd, orthopnea, n, v, dizziness, syncope, edema, weight gain, or early satiety. All other systems reviewed and are otherwise negative except as noted above.  Physical Exam    VS:  BP (!) 122/58   Pulse 77   Ht 5\' 6"  (1.676 m)   Wt 199 lb 12.8 oz (90.6 kg)   SpO2 98%   BMI 32.25 kg/m  , BMI Body mass index is 32.25 kg/m. GEN: Well nourished, well developed, in no acute distress. HEENT: normal. Neck: Supple, no JVD, carotid bruits, or masses. Cardiac: RRR, no murmurs, rubs, or gallops. No clubbing, cyanosis, edema.  Radials/DP/PT 2+ and equal bilaterally.  Respiratory:  Respirations regular and unlabored, clear to auscultation bilaterally. GI: Soft, nontender, nondistended, BS + x 4. MS: no deformity or atrophy. Skin: warm and dry, no rash. Neuro:  Strength and sensation are intact. Psych: Normal affect.  Accessory Clinical Findings    ECG personally reviewed by me today -regular sinus rhythm, 77, first-degree AV block, PACs, inferior infarct- no acute changes.  Assessment & Plan    1.  Coronary artery disease: Status post CABG x4 in September 2016.  Negative Myoview in February 2017.  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  He denies any chest pain or dyspnea.  He is due for DOT license renewal.  I have reviewed requirements today.  He does not require stress testing at this time but will require annual stress testing beginning 5 years post CABG  September 2021.  He remains on aspirin and statin therapy.  2.  Essential hypertension: Blood pressure is normal.  He was previously  treated with lisinopril but he is no longer on this.  3.  Hyperlipidemia/hypertriglyceridemia: He remains on high potency statin therapy.  Total cholesterol was 151 in August.  LDL not calculated secondary to hypertriglyceridemia.  Triglycerides remain elevated at 420.  He may ultimately benefit from initiation of Vascepa if not cost prohibitive.  4.  Disposition: Follow-up in 1 year or sooner if necessary.  Murray Hodgkins, NP 08/01/2018, 4:25 PM

## 2018-08-08 ENCOUNTER — Other Ambulatory Visit: Payer: Self-pay | Admitting: Cardiovascular Disease

## 2018-08-14 ENCOUNTER — Other Ambulatory Visit: Payer: Self-pay

## 2018-08-14 MED ORDER — ATORVASTATIN CALCIUM 80 MG PO TABS: ORAL_TABLET | ORAL | 3 refills | Status: DC

## 2018-08-14 NOTE — Telephone Encounter (Signed)
Requested Prescriptions   Signed Prescriptions Disp Refills  . atorvastatin (LIPITOR) 80 MG tablet 90 tablet 3    Sig: TAKE ONE TABLET BY MOUTH ONCE DAILY AT 6PM    Authorizing Provider: Murray Hodgkins RONALD    Ordering User: Janan Ridge

## 2018-08-16 ENCOUNTER — Ambulatory Visit: Payer: PPO | Admitting: Cardiovascular Disease

## 2018-08-16 ENCOUNTER — Other Ambulatory Visit: Payer: Self-pay | Admitting: Cardiovascular Disease

## 2018-08-23 ENCOUNTER — Other Ambulatory Visit: Payer: Self-pay | Admitting: Cardiovascular Disease

## 2018-08-23 ENCOUNTER — Telehealth: Payer: Self-pay | Admitting: Cardiovascular Disease

## 2018-08-23 NOTE — Telephone Encounter (Signed)
I spoke with pt and mentioned to him that lisinopril 20 mg tablet was not on his last OV medication list. Pt mentioned that he does take Lisinopril 20 mg tablet qd.   Please advise if ok to refill.

## 2018-08-23 NOTE — Telephone Encounter (Signed)
°*  STAT* If patient is at the pharmacy, call can be transferred to refill team.   1. Which medications need to be refilled? (please list name of each medication and dose if known) Lisinopril ( patient is not sure if he is supposed to be taking )  2. Which pharmacy/location (including street and city if local pharmacy) is medication to be sent to? [facility] [address]    3. Do they need a 30 day or 90 day supply?  90   .

## 2018-08-23 NOTE — Telephone Encounter (Signed)
Returned the call to the patient. There was no answer and no voicemail available. Will try back.

## 2018-08-27 NOTE — Telephone Encounter (Signed)
Call attempted, no VM available

## 2018-08-28 NOTE — Telephone Encounter (Signed)
Ok to refill. Labs ok in August.

## 2018-08-28 NOTE — Telephone Encounter (Signed)
Patient seen by Dr. Fletcher Anon  03/16/17- he was taking lisinopril 20 mg once daily- reported doing well on higher dose of lisinopril. He was seen by Dr. Caryn Section on 04/05/18- lisinopril 20 mg once daily was on his medication list at that time.  Patient most recently seen in our clinic on 08/01/18 with Ralph Bayley, [name] and lisinopril was not on his medication list.   Per notes below- the patient was contacted by our [facility] who confirmed with the patient that he has been taking lisinopril 20 mg once daily.  To Ralph Bayley, [name] to review for refill. Any reason to stop/ change lisinopril 20 mg once daily prior to me refilling this?

## 2018-08-29 MED ORDER — LISINOPRIL 20 MG PO TABS: 20.0000 mg | ORAL_TABLET | Freq: Every day | ORAL | 3 refills | Status: DC

## 2018-08-29 NOTE — Telephone Encounter (Signed)
RX refill for lisinopril 20 mg once daily #90 w/ 3 refills sent to the [facility] on [address].  I have notified the patient of the above.  He voices understanding.

## 2018-10-10 DIAGNOSIS — Z1283 Encounter for screening for malignant neoplasm of skin: Secondary | ICD-10-CM | POA: Diagnosis not present

## 2018-10-10 DIAGNOSIS — L821 Other seborrheic keratosis: Secondary | ICD-10-CM | POA: Diagnosis not present

## 2018-10-10 DIAGNOSIS — D1800 Hemangioma unspecified site: Secondary | ICD-10-CM | POA: Diagnosis not present

## 2018-10-10 DIAGNOSIS — L570 Actinic keratosis: Secondary | ICD-10-CM | POA: Diagnosis not present

## 2018-10-10 DIAGNOSIS — L578 Other skin changes due to chronic exposure to nonionizing radiation: Secondary | ICD-10-CM | POA: Diagnosis not present

## 2018-10-10 DIAGNOSIS — Z85828 Personal history of other malignant neoplasm of skin: Secondary | ICD-10-CM | POA: Diagnosis not present

## 2018-10-10 DIAGNOSIS — L820 Inflamed seborrheic keratosis: Secondary | ICD-10-CM | POA: Diagnosis not present

## 2018-10-17 ENCOUNTER — Other Ambulatory Visit: Payer: Self-pay

## 2018-10-17 ENCOUNTER — Ambulatory Visit (INDEPENDENT_AMBULATORY_CARE_PROVIDER_SITE_OTHER): Payer: PPO | Admitting: Family Medicine

## 2018-10-17 ENCOUNTER — Encounter: Payer: Self-pay | Admitting: Family Medicine

## 2018-10-17 VITALS — BP 110/58 | HR 64 | Temp 97.6°F | Resp 16 | Wt 194.0 lb

## 2018-10-17 DIAGNOSIS — E785 Hyperlipidemia, unspecified: Secondary | ICD-10-CM | POA: Diagnosis not present

## 2018-10-17 DIAGNOSIS — R7303 Prediabetes: Secondary | ICD-10-CM | POA: Diagnosis not present

## 2018-10-17 DIAGNOSIS — I10 Essential (primary) hypertension: Secondary | ICD-10-CM | POA: Diagnosis not present

## 2018-10-17 LAB — POCT GLYCOSYLATED HEMOGLOBIN (HGB A1C): Hemoglobin A1C: 6.1 % — AB (ref 4.0–5.6)

## 2018-10-18 NOTE — Progress Notes (Signed)
Patient: [name] Male DOB: 1951/01/08 68 y.o. MRN: [id number]  Visit Date: 10/17/2018  Today's Provider: [name], MD  No chief complaint on file.   Subjective:  Subjective  HPI  Prediabetes, Follow-up:  Recent Labs       Lab Results  Component Value Date   HGBA1C 6.2 (H) 04/05/2018   HGBA1C 6.3 (H) 03/09/2017   HGBA1C 5.3 05/03/2015   GLUCOSE 107 (H) 04/05/2018   GLUCOSE 131 (H) 03/09/2017   GLUCOSE 122 (H) 06/23/2016   Last seen for for this6 months ago.  Management since that visit includes work on lifestyle changes.  Current symptoms include none and have been stable.  Weight trend: stable   Current diet: in general, a "healthy" diet  Current exercise: walking  Pertinent Labs:  Labs (Brief)         Component Value Date/Time   CHOL 151 04/05/2018 1043   TRIG 420 (H) 04/05/2018 1043   CHOLHDL 6.0 (H) 04/05/2018 1043   CHOLHDL 7.7 05/03/2015 0921   CREATININE 0.97 04/05/2018 1043   CREATININE 1.06 02/05/2014 0724      Wt Readings from Last 3 Encounters:  08/01/18 199 lb 12.8 oz (90.6 kg)  04/05/18 206 lb 3.2 oz (93.5 kg)  02/21/18 200 lb (90.7 kg)   Hypertension, follow-up:    BP Readings from Last 3 Encounters:  08/01/18 (!) 122/58  04/05/18 (!) 108/58  02/21/18 119/67   He was last seen for hypertension 6 months ago.  BP at that visit was 108/58.  Management since that visit includes No changes  He reports excellent compliance with treatment.  He is not having side effects.  He is exercising.  He is adherent to low salt diet.  Outside blood pressures are 120's/50-60's.  He is experiencing none.  Patient denies chest pain, dyspnea, fatigue, palpitations and syncope.  Cardiovascular risk factors include advanced age (older than 31 for men, 33 for women), hypertension and male gender.  Use of agents associated with hypertension: none.  ------------------------------------------------------------------------  Lipid/Cholesterol, Follow-up:   Last seen for this 6 months ago.  Management since that visit includes No changes.  Last Lipid Panel:  Labs (Brief)         Component Value Date/Time   CHOL 151 04/05/2018 1043   TRIG 420 (H) 04/05/2018 1043   HDL 25 (L) 04/05/2018 1043   CHOLHDL 6.0 (H) 04/05/2018 1043   CHOLHDL 7.7 05/03/2015 0921   VLDL 32 05/03/2015 0921   LDLCALC Comment 04/05/2018 1043   He reports excellent compliance with treatment.  He is not having side effects.     Wt Readings from Last 3 Encounters:  08/01/18 199 lb 12.8 oz (90.6 kg)  04/05/18 206 lb 3.2 oz (93.5 kg)  02/21/18 200 lb (90.7 kg)   ------------------------------------------------------------------------       Allergies  Allergen Reactions  . Lovastatin Other (See Comments)    Chest pain  . Pravastatin Other (See Comments)    Nose bleed    Current Outpatient Medications:  . aspirin 81 MG EC tablet, Take 1 tablet (81 mg total) by mouth daily., Disp: , Rfl:  . atorvastatin (LIPITOR) 80 MG tablet, TAKE ONE TABLET BY MOUTH ONCE DAILY AT 6PM, Disp: 90 tablet, Rfl: 3  . ENSURE PLUS (ENSURE PLUS) LIQD, Take 237 mLs by mouth daily., Disp: , Rfl:  . lisinopril (PRINIVIL,ZESTRIL) 20 MG tablet, Take 1 tablet (20 mg total) by mouth daily., Disp: 90 tablet, Rfl: 3  . meloxicam (MOBIC) 15  MG tablet, Take 15 mg by mouth daily as needed. , Disp: , Rfl:  . Multiple Vitamin (MULTIVITAMIN) tablet, Take 1 tablet by mouth daily., Disp: , Rfl:  . Omega-3 Fatty Acids (FISH OIL) 1200 MG CAPS, Take 1,200 mg by mouth daily., Disp: , Rfl:  Review of Systems  Constitutional: Negative.  HENT: Positive for postnasal drip.  Respiratory: Positive for cough. Negative for apnea, choking, chest tightness, shortness of breath, wheezing and stridor.  Cardiovascular: Negative.  Gastrointestinal: Negative.  Allergic/Immunologic: Positive for environmental allergies. Negative for food allergies and immunocompromised state.  Neurological: Negative for dizziness,  light-headedness and headaches.  Social History        Tobacco Use  . Smoking status: Never Smoker  . Smokeless tobacco: Never Used  Substance Use Topics  . Alcohol use: Not Currently    Alcohol/week: 0.0 standard drinks   Objective:  BP (!) 110/58 (BP Location: Right Arm, Patient Position: Sitting, Cuff Size: Large)   Pulse 64   Temp 97.6 F (36.4 C) (Oral)   Resp 16   Wt 194 lb (88 kg)   BMI 31.31 kg/m   Physical Exam   General Appearance:    Alert, cooperative, no distress  Eyes:    PERRL, conjunctiva/corneas clear, EOM's intact       Lungs:     Clear to auscultation bilaterally, respirations unlabored  Heart:    Regular rate and rhythm  Neurologic:   Awake, alert, oriented x 3. No apparent focal neurological           defect.      A1c=6.1%  Assessment & Plan  1. Essential hypertension Well controlled.  Continue current medications.    2. Hyperlipidemia, unspecified hyperlipidemia type He is tolerating atorvastatin well with no adverse effects.    3. Prediabetes Doing well with diet and exercise.  - POCT glycosylated hemoglobin (Hb A1C)  [name], MD  [facility]

## 2018-10-26 NOTE — Patient Instructions (Signed)
.   Please review the attached list of medications and notify my office if there are any errors.   . Please bring all of your medications to every appointment so we can make sure that our medication list is the same as yours.   

## 2018-11-15 ENCOUNTER — Telehealth: Payer: Self-pay | Admitting: Family Medicine

## 2018-11-15 MED ORDER — LISINOPRIL 10 MG PO TABS: 10.0000 mg | ORAL_TABLET | Freq: Every day | ORAL | 3 refills | Status: DC

## 2018-11-15 NOTE — Telephone Encounter (Signed)
Patient's wife called stating that on Adair's last OV you had cut his Lisinopril down to 10 mg. Qd.   He needs a new RX sent to [facility] on [address]. For this.   90 pills please with refills.

## 2018-11-15 NOTE — Telephone Encounter (Signed)
Please Review

## 2018-11-18 ENCOUNTER — Telehealth: Payer: Self-pay

## 2018-11-18 NOTE — Telephone Encounter (Signed)
Patient wife Levander Campion) called back and was advised that medication was send to pharmacy on Friday, 11/15/2018.

## 2019-02-19 ENCOUNTER — Telehealth: Payer: Self-pay | Admitting: Urology

## 2019-02-19 NOTE — Telephone Encounter (Signed)
Patient has an app for Friday can you put in an order for a KUB please?   Thanks, Sharyn Lull

## 2019-02-21 ENCOUNTER — Ambulatory Visit
Admission: RE | Admit: 2019-02-21 | Discharge: 2019-02-21 | Disposition: A | Discharge status: Home / Self Care | Payer: PPO | Source: Ambulatory Visit | Attending: Urology | Admitting: Urology

## 2019-02-21 ENCOUNTER — Encounter: Payer: Self-pay | Admitting: Urology

## 2019-02-21 ENCOUNTER — Other Ambulatory Visit: Payer: Self-pay

## 2019-02-21 ENCOUNTER — Other Ambulatory Visit: Payer: Self-pay | Admitting: Urology

## 2019-02-21 ENCOUNTER — Ambulatory Visit: Payer: PPO | Admitting: Urology

## 2019-02-21 VITALS — BP 111/66 | HR 65 | Ht 66.0 in | Wt 196.4 lb

## 2019-02-21 DIAGNOSIS — N200 Calculus of kidney: Secondary | ICD-10-CM

## 2019-02-21 DIAGNOSIS — R269 Unspecified abnormalities of gait and mobility: Secondary | ICD-10-CM | POA: Insufficient documentation

## 2019-02-21 DIAGNOSIS — M1610 Unilateral primary osteoarthritis, unspecified hip: Secondary | ICD-10-CM | POA: Insufficient documentation

## 2019-02-21 DIAGNOSIS — N400 Enlarged prostate without lower urinary tract symptoms: Secondary | ICD-10-CM

## 2019-02-21 NOTE — Patient Instructions (Signed)
Dietary Guidelines to Help Prevent Kidney Stones Kidney stones are deposits of minerals and salts that form inside your kidneys. Your risk of developing kidney stones may be greater depending on your diet, your lifestyle, the medicines you take, and whether you have certain medical conditions. Most people can reduce their chances of developing kidney stones by following the instructions below. Depending on your overall health and the type of kidney stones you tend to develop, your dietitian may give you more specific instructions. What are tips for following this plan? Reading food labels  Choose foods with "no salt added" or "low-salt" labels. Limit your sodium intake to less than 1500 mg per day.  Choose foods with calcium for each meal and snack. Try to eat about 300 mg of calcium at each meal. Foods that contain 200-500 mg of calcium per serving include: ? 8 oz (237 ml) of milk, fortified nondairy milk, and fortified fruit juice. ? 8 oz (237 ml) of kefir, yogurt, and soy yogurt. ? 4 oz (118 ml) of tofu. ? 1 oz of cheese. ? 1 cup (300 g) of dried figs. ? 1 cup (91 g) of cooked broccoli. ? 1-3 oz can of sardines or mackerel.  Most people need 1000 to 1500 mg of calcium each day. Talk to your dietitian about how much calcium is recommended for you. Shopping  Buy plenty of fresh fruits and vegetables. Most people do not need to avoid fruits and vegetables, even if they contain nutrients that may contribute to kidney stones.  When shopping for convenience foods, choose: ? Whole pieces of fruit. ? Premade salads with dressing on the side. ? Low-fat fruit and yogurt smoothies.  Avoid buying frozen meals or prepared deli foods.  Look for foods with live cultures, such as yogurt and kefir. Cooking  Do not add salt to food when cooking. Place a salt shaker on the table and allow each person to add his or her own salt to taste.  Use vegetable protein, such as beans, textured vegetable  protein (TVP), or tofu instead of meat in pasta, casseroles, and soups. Meal planning   Eat less salt, if told by your dietitian. To do this: ? Avoid eating processed or premade food. ? Avoid eating fast food.  Eat less animal protein, including cheese, meat, poultry, or fish, if told by your dietitian. To do this: ? Limit the number of times you have meat, poultry, fish, or cheese each week. Eat a diet free of meat at least 2 days a week. ? Eat only one serving each day of meat, poultry, fish, or seafood. ? When you prepare animal protein, cut pieces into small portion sizes. For most meat and fish, one serving is about the size of one deck of cards.  Eat at least 5 servings of fresh fruits and vegetables each day. To do this: ? Keep fruits and vegetables on hand for snacks. ? Eat 1 piece of fruit or a handful of berries with breakfast. ? Have a salad and fruit at lunch. ? Have two kinds of vegetables at dinner.  Limit foods that are high in a substance called oxalate. These include: ? Spinach. ? Rhubarb. ? Beets. ? Potato chips and french fries. ? Nuts.  If you regularly take a diuretic medicine, make sure to eat at least 1-2 fruits or vegetables high in potassium each day. These include: ? Avocado. ? Banana. ? Orange, prune, carrot, or tomato juice. ? Baked potato. ? Cabbage. ? Beans and split   peas. General instructions   Drink enough fluid to keep your urine clear or pale yellow. This is the most important thing you can do.  Talk to your health care provider and dietitian about taking daily supplements. Depending on your health and the cause of your kidney stones, you may be advised: ? Not to take supplements with vitamin C. ? To take a calcium supplement. ? To take a daily probiotic supplement. ? To take other supplements such as magnesium, fish oil, or vitamin B6.  Take all medicines and supplements as told by your health care provider.  Limit alcohol intake to no  more than 1 drink a day for nonpregnant women and 2 drinks a day for men. One drink equals 12 oz of beer, 5 oz of wine, or 1 oz of hard liquor.  Lose weight if told by your health care provider. Work with your dietitian to find strategies and an eating plan that works best for you. What foods are not recommended? Limit your intake of the following foods, or as told by your dietitian. Talk to your dietitian about specific foods you should avoid based on the type of kidney stones and your overall health. Grains Breads. Bagels. Rolls. Baked goods. Salted crackers. Cereal. Pasta. Vegetables Spinach. Rhubarb. Beets. Canned vegetables. [name]. Olives. Meats and other protein foods Nuts. Nut butters. Large portions of meat, poultry, or fish. Salted or cured meats. Deli meats. Hot dogs. Sausages. Dairy Cheese. Beverages Regular soft drinks. Regular vegetable juice. Seasonings and other foods Seasoning blends with salt. Salad dressings. Canned soups. Soy sauce. Ketchup. Barbecue sauce. Canned pasta sauce. Casseroles. Pizza. Lasagna. Frozen meals. Potato chips. [place] fries. Summary  You can reduce your risk of kidney stones by making changes to your diet.  The most important thing you can do is drink enough fluid. You should drink enough fluid to keep your urine clear or pale yellow.  Ask your health care provider or dietitian how much protein from animal sources you should eat each day, and also how much salt and calcium you should have each day. This information is not intended to replace advice given to you by your health care provider. Make sure you discuss any questions you have with your health care provider. Document Released: 11/18/2010 Document Revised: 11/13/2018 Document Reviewed: 07/04/2016 Elsevier Patient Education  2020 [employer].

## 2019-02-23 ENCOUNTER — Encounter: Payer: Self-pay | Admitting: Urology

## 2019-02-23 NOTE — Progress Notes (Signed)
02/21/2019 10:47 PM   [name] 09-07-1950 [id number]  Referring provider: Birdie Sons, MD [address] [place],  [place] [address]  Chief Complaint  Patient presents with  . Nephrolithiasis    Urologic history: 1.  Nephrolithiasis  -Asymptomatic 7 mm left upper pole calculus   HPI: 68 year old male presents for annual follow-up of nephrolithiasis.  Since his visit last year he has done well he denies flank or abdominal pain.  KUB performed was reviewed and there is a stable left renal calculus.  Small calcification overlying the right renal outline.  CT May 2016 did show 3 punctate right renal calculi.  PSA performed by his PCP August 2018 was stable at 0.8.   PMH: Past Medical History:  Diagnosis Date  . CAD (coronary artery disease)    a. CT scan 01/02/2015: extensive coronary atherosclerosis;  b. 04/2015 Inf STEMI/Cath/CABG x 4: LIMA->LAD, VG->Diag, VG->OM2, VG->RPL; c. 09/2015 MV: EF 55-65%, infsept/inf infarct w/o ischemia->Low Risk.  . Colon polyps   . Complete heart block ([place])    a. 04/2015 in setting of inferior STEMI-->resolved.  . Hyperlipidemia   . Hypertension   . Ischemic cardiomyopathy    a. 04/2015 LV gram: EF 35-40%; b. 09/2015 MV: EF 55-65%.  . Kidney stones    a. May 2016.  . MI (myocardial infarction) ([place]) 04/2015  . Postoperative atrial fibrillation ([place])    a. 04/2015 Post-op CABG AF/AFlutter--> converted with rapid atrial pacing and amio; b. CHA2DS2VASc = 3->Coumadin.  . 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 CARDIAC CATHETERIZATION N/A 05/03/2015   Procedure: Left Heart Cath and Coronary Angiography;  Surgeon: Wellington Hampshire, MD;  Location: [facility];  Service: Cardiovascular;  Laterality: N/A;  . CATARACT EXTRACTION, BILATERAL    . CORONARY ARTERY BYPASS GRAFT N/A 05/03/2015   Procedure: CORONARY ARTERY BYPASS GRAFTING (CABG) times four using left internal  mammary artery and right saphenous vein.;  Surgeon: [name], MD;  Location: [facility];  Service: Open Heart Surgery;  Laterality: N/A;  . HERNIA REPAIR     ventral hernia repair  . JOINT REPLACEMENT    . KNEE SURGERY     arthroscopic knee surgery  . NECK SURGERY     Spinal fusion- neck. Dr. Sherley B[facility] of [facility])  . right hip replacement Right 02/04/2014   Dr. Sabra Heck  . TEE WITHOUT CARDIOVERSION N/A 05/03/2015   Procedure: TRANSESOPHAGEAL ECHOCARDIOGRAM (TEE);  Surgeon: [name], MD;  Location: [facility];  Service: Open Heart Surgery;  Laterality: N/A;    Home Medications:  Allergies as of 02/21/2019      Reactions   Lovastatin Other (See Comments)   Chest pain   Pravastatin Other (See Comments)   Nose bleed      Medication List       Accurate as of February 21, 2019 11:59 PM. If you have any questions, ask your nurse or doctor.        aspirin 81 MG EC tablet Take 1 tablet (81 mg total) by mouth daily.   atorvastatin 80 MG tablet Commonly known as: LIPITOR TAKE ONE TABLET BY MOUTH ONCE DAILY AT 6PM   Ensure Plus Liqd Take 237 mLs by mouth daily.   Fish Oil 1200 MG Caps Take 1,200 mg by mouth daily.   lisinopril 10 MG tablet Commonly known as: ZESTRIL Take 1 tablet (10 mg total) by  mouth daily.   meloxicam 15 MG tablet Commonly known as: MOBIC Take 15 mg by mouth daily as needed.   multivitamin tablet Take 1 tablet by mouth daily.       Allergies:  Allergies  Allergen Reactions  . Lovastatin Other (See Comments)    Chest pain  . Pravastatin Other (See Comments)    Nose bleed    Family History: Family History  Problem Relation Age of Onset  . CAD Father   . Heart attack Father   . Heart Problems Sister        CABG  . Heart attack Brother   . Heart attack Brother   . Heart attack Brother   . Heart attack Brother   . Heart attack Sister   . CAD Maternal Uncle   . CAD Paternal Uncle     Social History:   reports that he has never smoked. He has never used smokeless tobacco. He reports previous alcohol use. He reports that he does not use drugs.  ROS: UROLOGY Frequent Urination?: Yes Hard to postpone urination?: Yes Burning/pain with urination?: No Get up at night to urinate?: Yes Leakage of urine?: Yes Urine stream starts and stops?: Yes Trouble starting stream?: No Do you have to strain to urinate?: No Blood in urine?: No Urinary tract infection?: No Sexually transmitted disease?: No Injury to kidneys or bladder?: No Painful intercourse?: No Weak stream?: No Erection problems?: No Penile pain?: No  Gastrointestinal Nausea?: No Vomiting?: No Indigestion/heartburn?: No Diarrhea?: No Constipation?: No  Constitutional Fever: No Night sweats?: No Weight loss?: No Fatigue?: No  Skin Skin rash/lesions?: No Itching?: No  Eyes Blurred vision?: No Double vision?: No  Ears/Nose/Throat Sore throat?: No Sinus problems?: No  Hematologic/Lymphatic Swollen glands?: No Easy bruising?: No  Cardiovascular Leg swelling?: No Chest pain?: No  Respiratory Cough?: No Shortness of breath?: No  Endocrine Excessive thirst?: Yes  Musculoskeletal Back pain?: No Joint pain?: Yes  Neurological Headaches?: No Dizziness?: No  Psychologic Depression?: No Anxiety?: No  Physical Exam: BP 111/66 (BP Location: Left Arm, Patient Position: Sitting, Cuff Size: Normal)   Pulse 65   Ht 5\' 6"  (1.676 m)   Wt 196 lb 6.4 oz (89.1 kg)   BMI 31.70 kg/m   Constitutional:  Alert and oriented, No acute distress. HEENT: [place] AT, moist mucus membranes.  Trachea midline, no masses. Cardiovascular: No clubbing, cyanosis, or edema. Respiratory: Normal respiratory effort, no increased work of breathing. GI: Abdomen is soft, nontender, nondistended, no abdominal masses GU: No CVA tenderness.  Prostate 45 g, smooth without nodules Skin: No rashes, bruises or suspicious lesions. Neurologic:  Grossly intact, no focal deficits, moving all 4 extremities. Psychiatric: Normal mood and affect.   Pertinent Imaging: Images were personally reviewed Results for orders placed during the hospital encounter of 02/21/19  Abdomen 1 view (KUB)   Narrative CLINICAL DATA:  Nephrolithiasis  EXAM: ABDOMEN - 1 VIEW  COMPARISON:  CT Jan 02, 2015  FINDINGS: 6 mm stone in the midpole of the left kidney. Small calcification projects over the midpole of the right kidney. Rounded calcified phleboliths in the pelvis.  Nonobstructive bowel gas pattern.  No organomegaly or free air.  IMPRESSION: Bilateral nephrolithiasis.   Electronically Signed   By: Rolm Baptise M.D.   On: 02/21/2019 15:14    Assessment & Plan:   68 year old male with asymptomatic renal calculi.  He desires to continue surveillance.  Schedule follow-up visit with KUB and 18 months.  He will have his  PSA performed next month during his annual physical.  We did discuss prostate cancer screening guidelines and that with his low PSA and age that prostate cancer screening could be discontinued.  Abbie Sons, [place] [address], [place], [place] [address] [phone]

## 2019-04-09 NOTE — Progress Notes (Signed)
Subjective:   [name] is a 68 y.o. male who presents for Medicare Annual/Subsequent preventive examination.    This visit is being conducted through telemedicine due to the COVID-19 pandemic. This patient has given me verbal consent via doximity to conduct this visit, patient states they are participating from their home address. Some vital signs may be absent or patient reported.    Patient identification: identified by name, DOB, and current address  Review of Systems:  N/A  Cardiac Risk Factors include: advanced age (>82men, >62 women);dyslipidemia;hypertension;male gender     Objective:    Vitals: There were no vitals taken for this visit.  There is no height or weight on file to calculate BMI. Unable to obtain vitals due to visit being conducted via telephonically.   Advanced Directives 04/10/2019 04/05/2018 02/23/2017 06/01/2015 05/04/2015 05/03/2015 02/06/2015  Does Patient Have a Medical Advance Directive? No No No Yes;No No No No  Does patient want to make changes to medical advance directive? - - - Yes - information given - - -  Would patient like information on creating a medical advance directive? No - Patient declined No - Patient declined No - Patient declined Yes - Educational materials given No - patient declined information No - patient declined information No - patient declined information    Tobacco Social History   Tobacco Use  Smoking Status Never Smoker  Smokeless Tobacco Never Used     Counseling given: Not Answered   Clinical Intake:  Pre-visit preparation completed: Yes  Pain : No/denies pain Pain Score: 0-No pain     Nutritional Risks: None Diabetes: No  How often do you need to have someone help you when you read instructions, pamphlets, or other written materials from your doctor or pharmacy?: 1 - Never  Interpreter Needed?: No  Information entered by :: [facility], LPN  Past Medical History:  Diagnosis Date  . CAD (coronary artery  disease)    a. CT scan 01/02/2015: extensive coronary atherosclerosis;  b. 04/2015 Inf STEMI/Cath/CABG x 4: LIMA->LAD, VG->Diag, VG->OM2, VG->RPL; c. 09/2015 MV: EF 55-65%, infsept/inf infarct w/o ischemia->Low Risk.  . Colon polyps   . Complete heart block ([place])    a. 04/2015 in setting of inferior STEMI-->resolved.  . Hyperlipidemia   . Hypertension   . Ischemic cardiomyopathy    a. 04/2015 LV gram: EF 35-40%; b. 09/2015 MV: EF 55-65%.  . Kidney stones    a. May 2016.  . MI (myocardial infarction) ([place]) 04/2015  . Postoperative atrial fibrillation ([place])    a. 04/2015 Post-op CABG AF/AFlutter--> converted with rapid atrial pacing and amio; b. CHA2DS2VASc = 3->Coumadin.  . 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 CARDIAC CATHETERIZATION N/A 05/03/2015   Procedure: Left Heart Cath and Coronary Angiography;  Surgeon: Wellington Hampshire, MD;  Location: [facility];  Service: Cardiovascular;  Laterality: N/A;  . CATARACT EXTRACTION, BILATERAL    . CORONARY ARTERY BYPASS GRAFT N/A 05/03/2015   Procedure: CORONARY ARTERY BYPASS GRAFTING (CABG) times four using left internal mammary artery and right saphenous vein.;  Surgeon: [name], MD;  Location: [facility];  Service: Open Heart Surgery;  Laterality: N/A;  . HERNIA REPAIR     ventral hernia repair  . JOINT REPLACEMENT    . KNEE SURGERY     arthroscopic knee surgery  . NECK SURGERY     Spinal fusion- neck. Dr. Sherley Bounds of [facility])  .  right hip replacement Right 02/04/2014   Dr. Sabra Heck  . TEE WITHOUT CARDIOVERSION N/A 05/03/2015   Procedure: TRANSESOPHAGEAL ECHOCARDIOGRAM (TEE);  Surgeon: [name], MD;  Location: [facility];  Service: Open Heart Surgery;  Laterality: N/A;   Family History  Problem Relation Age of Onset  . CAD Father   . Heart attack Father   . Heart Problems Sister        CABG  . Heart attack Brother   . Heart attack Brother   . Heart  attack Brother   . Heart attack Brother   . Heart attack Sister   . CAD Maternal Uncle   . CAD Paternal Uncle    Social History   Socioeconomic History  . Marital status: Married    Spouse name: Not on file  . Number of children: 1  . Years of education: Not on file  . Highest education level: Associate degree: occupational, Hotel manager, or vocational program  Occupational History  . Occupation: Truck Geophysicist/field seismologist    Comment: part time  Social Needs  . Financial resource strain: Not hard at all  . Food insecurity    Worry: Never true    Inability: Never true  . Transportation needs    Medical: No    Non-medical: No  Tobacco Use  . Smoking status: Never Smoker  . Smokeless tobacco: Never Used  Substance and Sexual Activity  . Alcohol use: Not Currently    Alcohol/week: 0.0 standard drinks  . Drug use: No  . Sexual activity: Yes    Birth control/protection: None  Lifestyle  . Physical activity    Days per week: 0 days    Minutes per session: 0 min  . Stress: Not at all  Relationships  . Social Herbalist on phone: Patient refused    Gets together: Patient refused    Attends religious service: Patient refused    Active member of club or organization: Patient refused    Attends meetings of clubs or organizations: Patient refused    Relationship status: Patient refused  Other Topics Concern  . Not on file  Social History Narrative  . Not on file    Outpatient Encounter Medications as of 04/10/2019  Medication Sig  . aspirin 81 MG EC tablet Take 1 tablet (81 mg total) by mouth daily.  Marland Kitchen atorvastatin (LIPITOR) 80 MG tablet TAKE ONE TABLET BY MOUTH ONCE DAILY AT 6PM  . ENSURE PLUS (ENSURE PLUS) LIQD Take 237 mLs by mouth daily.  Marland Kitchen lisinopril (PRINIVIL,ZESTRIL) 10 MG tablet Take 1 tablet (10 mg total) by mouth daily.  . meloxicam (MOBIC) 15 MG tablet Take 15 mg by mouth daily as needed.   . Multiple Vitamin (MULTIVITAMIN) tablet Take 1 tablet by mouth daily.  .  Omega-3 Fatty Acids (FISH OIL) 1200 MG CAPS Take 1,200 mg by mouth daily.   No facility-administered encounter medications on file as of 04/10/2019.     Activities of Daily Living In your present state of health, do you have any difficulty performing the following activities: 04/10/2019  Hearing? N  Vision? N  Difficulty concentrating or making decisions? N  Walking or climbing stairs? N  Dressing or bathing? N  Doing errands, shopping? N  Preparing Food and eating ? N  Using the Toilet? N  In the past six months, have you accidently leaked urine? Y  Comment Occasionally with urges. Has been seen by a urologist for this.  Do you have problems with loss of  bowel control? N  Managing your Medications? N  Managing your Finances? N  Housekeeping or managing your Housekeeping? N  Some recent data might be hidden    Patient Care Team: Birdie Sons, MD as PCP - General (Family Medicine) Wellington Hampshire, MD as PCP - Cardiology (Cardiology) Earnestine Leys, MD as Consulting Physician (Specialist) Abbie Sons, MD (Urology) Ralene Bathe, MD (Dermatology)   Assessment:   This is a routine wellness examination for [name].  Exercise Activities and Dietary recommendations Current Exercise Habits: The patient does not participate in regular exercise at present, Exercise limited by: None identified  Goals    . Exercise 3x per week (30 min per time)     Recommend to exercise for 3 days a week for at least 30 minutes at a time.     Marland Kitchen LIFESTYLE - DECREASE FALLS RISK     Recommend to remove any items from the home that may cause slips or trips.       Fall Risk: Fall Risk  04/10/2019 04/05/2018 02/23/2017 09/15/2015 06/01/2015  Falls in the past year? 1 No Yes No No  Number falls in past yr: 0 - 1 - -  Comment - - tripped - -  Injury with Fall? 0 - No - -  Follow up Falls prevention discussed - Falls prevention discussed - -    FALL RISK PREVENTION PERTAINING TO THE HOME:  Any  stairs in or around the home? Yes  If so, are there any without handrails? No   Home free of loose throw rugs in walkways, pet beds, electrical cords, etc? Yes  Adequate lighting in your home to reduce risk of falls? Yes   ASSISTIVE DEVICES UTILIZED TO PREVENT FALLS:  Life alert? No  Use of a cane, walker or w/c? No  Grab bars in the bathroom? Yes  Shower chair or bench in shower? Yes  Elevated toilet seat or a handicapped toilet? Yes   TIMED UP AND GO:  Was the test performed? No .    Depression Screen PHQ 2/9 Scores 04/10/2019 04/05/2018 04/05/2018 02/23/2017  PHQ - 2 Score 0 0 0 0  PHQ- 9 Score - 1 - 3    Cognitive Function: Declined today.      6CIT Screen 02/23/2017  What Year? 0 points  What month? 0 points  What time? 0 points  Count back from 20 0 points  Months in reverse 0 points  Repeat phrase 0 points  Total Score 0    Immunization History  Administered Date(s) Administered  . Influenza, High Dose Seasonal PF 06/02/2017, 04/05/2018  . Influenza-Unspecified 05/07/2015  . Pneumococcal Conjugate-13 09/15/2015  . Pneumococcal Polysaccharide-23 02/23/2017  . Tdap 06/19/2008  . Zoster 09/15/2015    Qualifies for Shingles Vaccine? Yes  Zostavax completed 09/15/15. Due for Shingrix. Education has been provided regarding the importance of this vaccine. Pt has been advised to call insurance company to determine out of pocket expense. Advised may also receive vaccine at local pharmacy or Health Dept. Verbalized acceptance and understanding.  Tdap: Although this vaccine is not a covered service during a Wellness Exam, does the patient still wish to receive this vaccine today?  No .   Flu Vaccine: Due for Flu vaccine. Does the patient want to receive this vaccine today?  No .  Pneumococcal Vaccine: Completed series  Screening Tests Health Maintenance  Topic Date Due  . COLONOSCOPY  01/13/2016  . TETANUS/TDAP  06/19/2018  . INFLUENZA VACCINE  03/08/2019  .  Hepatitis C Screening  Completed  . PNA vac Low Risk Adult  Completed   Cancer Screenings:  Colorectal Screening: Completed 01/13/11. Repeat every 5 years. Declined a referral today. Does not want to to receive anymore colonoscopies.   Lung Cancer Screening: (Low Dose CT Chest recommended if Age 50-80 years, 30 pack-year currently smoking OR have quit w/in 15years.) does not qualify.   Additional Screening:  Hepatitis C Screening: Up to date  Vision Screening: Recommended annual ophthalmology exams for early detection of glaucoma and other disorders of the eye.  Dental Screening: Recommended annual dental exams for proper oral hygiene  Community Resource Referral:  CRR required this visit?  No        Plan:  I have personally reviewed and addressed the Medicare Annual Wellness questionnaire and have noted the following in the patient's chart:  A. Medical and social history B. Use of alcohol, tobacco or illicit drugs  C. Current medications and supplements D. Functional ability and status E.  Nutritional status F.  Physical activity G. Advance directives H. List of other physicians I.  Hospitalizations, surgeries, and ER visits in previous 12 months J.  [place] such as hearing and vision if needed, cognitive and depression L. Referrals and appointments   In addition, I have reviewed and discussed with patient certain preventive protocols, quality metrics, and best practice recommendations. A written personalized care plan for preventive services as well as general preventive health recommendations were provided to patient.   [place] Score, [place]  075-GRM Nurse Health Advisor   Nurse Notes: Declined a future order for a colonoscopy. Needs an influenza vaccine at [place] in office visit.

## 2019-04-10 ENCOUNTER — Ambulatory Visit (INDEPENDENT_AMBULATORY_CARE_PROVIDER_SITE_OTHER): Payer: PPO

## 2019-04-10 ENCOUNTER — Other Ambulatory Visit: Payer: Self-pay

## 2019-04-10 DIAGNOSIS — Z0000 Encounter for general adult medical examination without abnormal findings: Secondary | ICD-10-CM | POA: Diagnosis not present

## 2019-04-10 NOTE — Patient Instructions (Signed)
[name] , Thank you for taking time to come for your Medicare Wellness Visit. I appreciate your ongoing commitment to your health goals. Please review the following plan we discussed and let me know if I can assist you in the future.   Screening recommendations/referrals: Colonoscopy: Currently due, declined referral. Recommended yearly ophthalmology/optometry visit for glaucoma screening and checkup Recommended yearly dental visit for hygiene and checkup  Vaccinations: Influenza vaccine: Currently due Pneumococcal vaccine: Completed series Tdap vaccine: Pt declines today.  Shingles vaccine: Pt declines today.     Advanced directives: Advance directive discussed with you today. Even though you declined this today please call our office should you change your mind and we can give you the proper paperwork for you to fill out.  Conditions/risks identified: Fall risk preventatives discussed today. Recommend to exercise 3 days a week for at least 30 minutes at a time.   Next appointment: 04/11/19 @ 10:00 AM.  Preventive Care 65 Years and Older, Male Preventive care refers to lifestyle choices and visits with your health care provider that can promote health and wellness. What does preventive care include?  A yearly physical exam. This is also called an annual well check.  Dental exams once or twice a year.  Routine eye exams. Ask your health care provider how often you should have your eyes checked.  Personal lifestyle choices, including:  Daily care of your teeth and gums.  Regular physical activity.  Eating a healthy diet.  Avoiding tobacco and drug use.  Limiting alcohol use.  Practicing safe sex.  Taking low doses of aspirin every day.  Taking vitamin and mineral supplements as recommended by your health care provider. What happens during an annual well check? The services and screenings done by your health care provider during your annual well check will depend on  your age, overall health, lifestyle risk factors, and family history of disease. Counseling  Your health care provider may ask you questions about your:  Alcohol use.  Tobacco use.  Drug use.  Emotional well-being.  Home and relationship well-being.  Sexual activity.  Eating habits.  History of falls.  Memory and ability to understand (cognition).  Work and work Statistician. Screening  You may have the following tests or measurements:  Height, weight, and BMI.  Blood pressure.  Lipid and cholesterol levels. These may be checked every 5 years, or more frequently if you are over 34 years old.  Skin check.  Lung cancer screening. You may have this screening every year starting at age 87 if you have a 30-pack-year history of smoking and currently smoke or have quit within the past 15 years.  Fecal occult blood test (FOBT) of the stool. You may have this test every year starting at age 24.  Flexible sigmoidoscopy or colonoscopy. You may have a sigmoidoscopy every 5 years or a colonoscopy every 10 years starting at age 34.  Prostate cancer screening. Recommendations will vary depending on your family history and other risks.  Hepatitis C blood test.  Hepatitis B blood test.  Sexually transmitted disease (STD) testing.  Diabetes screening. This is done by checking your blood sugar (glucose) after you have not eaten for a while (fasting). You may have this done every 1-3 years.  Abdominal aortic aneurysm (AAA) screening. You may need this if you are a current or former smoker.  Osteoporosis. You may be screened starting at age 86 if you are at high risk. Talk with your health care provider about your test  results, treatment options, and if necessary, the need for more tests. Vaccines  Your health care provider may recommend certain vaccines, such as:  Influenza vaccine. This is recommended every year.  Tetanus, diphtheria, and acellular pertussis (Tdap, Td) vaccine.  You may need a Td booster every 10 years.  Zoster vaccine. You may need this after age 57.  Pneumococcal 13-valent conjugate (PCV13) vaccine. One dose is recommended after age 37.  Pneumococcal polysaccharide (PPSV23) vaccine. One dose is recommended after age 30. Talk to your health care provider about which screenings and vaccines you need and how often you need them. This information is not intended to replace advice given to you by your health care provider. Make sure you discuss any questions you have with your health care provider. Document Released: 08/20/2015 Document Revised: 04/12/2016 Document Reviewed: 05/25/2015 Elsevier Interactive Patient Education  2017 [place] Prevention in the Home Falls can cause injuries. They can happen to people of all ages. There are many things you can do to make your home safe and to help prevent falls. What can I do on the outside of my home?  Regularly fix the edges of walkways and driveways and fix any cracks.  Remove anything that might make you trip as you walk through a door, such as a raised step or threshold.  Trim any bushes or trees on the path to your home.  Use bright outdoor lighting.  Clear any walking paths of anything that might make someone trip, such as rocks or tools.  Regularly check to see if handrails are loose or broken. Make sure that both sides of any steps have handrails.  Any raised decks and porches should have guardrails on the edges.  Have any leaves, snow, or ice cleared regularly.  Use sand or salt on walking paths during winter.  Clean up any spills in your garage right away. This includes oil or grease spills. What can I do in the bathroom?  Use night lights.  Install grab bars by the toilet and in the tub and shower. Do not use towel bars as grab bars.  Use non-skid mats or decals in the tub or shower.  If you need to sit down in the shower, use a plastic, non-slip stool.  Keep the  floor dry. Clean up any water that spills on the floor as soon as it happens.  Remove soap buildup in the tub or shower regularly.  Attach bath mats securely with double-sided non-slip rug tape.  Do not have throw rugs and other things on the floor that can make you trip. What can I do in the bedroom?  Use night lights.  Make sure that you have a light by your bed that is easy to reach.  Do not use any sheets or blankets that are too big for your bed. They should not hang down onto the floor.  Have a firm chair that has side arms. You can use this for support while you get dressed.  Do not have throw rugs and other things on the floor that can make you trip. What can I do in the kitchen?  Clean up any spills right away.  Avoid walking on wet floors.  Keep items that you use a lot in easy-to-reach places.  If you need to reach something above you, use a strong step stool that has a grab bar.  Keep electrical cords out of the way.  Do not use floor polish or wax that makes  floors slippery. If you must use wax, use non-skid floor wax.  Do not have throw rugs and other things on the floor that can make you trip. What can I do with my stairs?  Do not leave any items on the stairs.  Make sure that there are handrails on both sides of the stairs and use them. Fix handrails that are broken or loose. Make sure that handrails are as long as the stairways.  Check any carpeting to make sure that it is firmly attached to the stairs. Fix any carpet that is loose or worn.  Avoid having throw rugs at the top or bottom of the stairs. If you do have throw rugs, attach them to the floor with carpet tape.  Make sure that you have a light switch at the top of the stairs and the bottom of the stairs. If you do not have them, ask someone to add them for you. What else can I do to help prevent falls?  Wear shoes that:  Do not have high heels.  Have rubber bottoms.  Are comfortable and fit  you well.  Are closed at the toe. Do not wear sandals.  If you use a stepladder:  Make sure that it is fully opened. Do not climb a closed stepladder.  Make sure that both sides of the stepladder are locked into place.  Ask someone to hold it for you, if possible.  Clearly mark and make sure that you can see:  Any grab bars or handrails.  First and last steps.  Where the edge of each step is.  Use tools that help you move around (mobility aids) if they are needed. These include:  Canes.  Walkers.  Scooters.  Crutches.  Turn on the lights when you go into a dark area. Replace any light bulbs as soon as they burn out.  Set up your furniture so you have a clear path. Avoid moving your furniture around.  If any of your floors are uneven, fix them.  If there are any pets around you, be aware of where they are.  Review your medicines with your doctor. Some medicines can make you feel dizzy. This can increase your chance of falling. Ask your doctor what other things that you can do to help prevent falls. This information is not intended to replace advice given to you by your health care provider. Make sure you discuss any questions you have with your health care provider. Document Released: 05/20/2009 Document Revised: 12/30/2015 Document Reviewed: 08/28/2014 Elsevier Interactive Patient Education  2017 [employer].

## 2019-04-11 ENCOUNTER — Ambulatory Visit: Payer: PPO

## 2019-04-11 ENCOUNTER — Encounter: Payer: PPO | Admitting: Family Medicine

## 2019-05-02 ENCOUNTER — Other Ambulatory Visit: Payer: Self-pay

## 2019-05-02 ENCOUNTER — Encounter: Payer: Self-pay | Admitting: Family Medicine

## 2019-05-02 ENCOUNTER — Ambulatory Visit (INDEPENDENT_AMBULATORY_CARE_PROVIDER_SITE_OTHER): Payer: PPO | Admitting: Family Medicine

## 2019-05-02 VITALS — BP 104/60 | HR 67 | Temp 96.2°F | Resp 15 | Ht 66.0 in | Wt 201.2 lb

## 2019-05-02 DIAGNOSIS — E669 Obesity, unspecified: Secondary | ICD-10-CM | POA: Diagnosis not present

## 2019-05-02 DIAGNOSIS — Z23 Encounter for immunization: Secondary | ICD-10-CM | POA: Diagnosis not present

## 2019-05-02 DIAGNOSIS — E785 Hyperlipidemia, unspecified: Secondary | ICD-10-CM

## 2019-05-02 DIAGNOSIS — Z0000 Encounter for general adult medical examination without abnormal findings: Secondary | ICD-10-CM

## 2019-05-02 DIAGNOSIS — Z125 Encounter for screening for malignant neoplasm of prostate: Secondary | ICD-10-CM

## 2019-05-02 DIAGNOSIS — Z86010 Personal history of colonic polyps: Secondary | ICD-10-CM

## 2019-05-02 DIAGNOSIS — I440 Atrioventricular block, first degree: Secondary | ICD-10-CM

## 2019-05-02 DIAGNOSIS — I25118 Atherosclerotic heart disease of native coronary artery with other forms of angina pectoris: Secondary | ICD-10-CM | POA: Diagnosis not present

## 2019-05-02 NOTE — Progress Notes (Signed)
Patient: [name], Male    DOB: [birth year], 68 y.o.   MRN: [id number] Visit Date: 05/02/2019  Today's Provider: [name], MD   Chief Complaint  Patient presents with  . Annual Exam   Subjective:     Complete Physical [name] is a 68 y.o. male. He feels well. He reports exercising occasionally. He reports he is sleeping well.  -----------------------------------------------------------   Review of Systems  Constitutional: Negative for chills, diaphoresis and fever.  HENT: Negative for congestion, ear discharge, ear pain, hearing loss, nosebleeds, sore throat and tinnitus.   Eyes: Negative for photophobia, pain, discharge and redness.  Respiratory: Negative for cough, shortness of breath, wheezing and stridor.   Cardiovascular: Negative for chest pain, palpitations and leg swelling.  Gastrointestinal: Negative for abdominal pain, blood in stool, constipation, diarrhea, nausea and vomiting.  Endocrine: Negative for polydipsia.  Genitourinary: Positive for frequency. Negative for dysuria, flank pain, hematuria and urgency.  Musculoskeletal: Negative for back pain, myalgias and neck pain.  Skin: Negative for rash.  Allergic/Immunologic: Negative for environmental allergies.  Neurological: Negative for dizziness, tremors, seizures, weakness and headaches.  Hematological: Does not bruise/bleed easily.  Psychiatric/Behavioral: Negative for hallucinations and suicidal ideas. The patient is not nervous/anxious.     Social History   Socioeconomic History  . Marital status: Married    Spouse name: Not on file  . Number of children: 1  . Years of education: Not on file  . Highest education level: Associate degree: occupational, Hotel manager, or vocational program  Occupational History  . Occupation: Truck Geophysicist/field seismologist    Comment: part time  Social Needs  . Financial resource strain: Not hard at all  . Food insecurity    Worry: Never true    Inability: Never  true  . Transportation needs    Medical: No    Non-medical: No  Tobacco Use  . Smoking status: Never Smoker  . Smokeless tobacco: Never Used  Substance and Sexual Activity  . Alcohol use: Not Currently    Alcohol/week: 0.0 standard drinks  . Drug use: No  . Sexual activity: Yes    Birth control/protection: None  Lifestyle  . Physical activity    Days per week: 0 days    Minutes per session: 0 min  . Stress: Not at all  Relationships  . Social Herbalist on phone: Patient refused    Gets together: Patient refused    Attends religious service: Patient refused    Active member of club or organization: Patient refused    Attends meetings of clubs or organizations: Patient refused    Relationship status: Patient refused  . Intimate partner violence    Fear of current or ex partner: Patient refused    Emotionally abused: Patient refused    Physically abused: Patient refused    Forced sexual activity: Patient refused  Other Topics Concern  . Not on file  Social History Narrative  . Not on file    Past Medical History:  Diagnosis Date  . CAD (coronary artery disease)    a. CT scan 01/02/2015: extensive coronary atherosclerosis;  b. 04/2015 Inf STEMI/Cath/CABG x 4: LIMA->LAD, VG->Diag, VG->OM2, VG->RPL; c. 09/2015 MV: EF 55-65%, infsept/inf infarct w/o ischemia->Low Risk.  . Colon polyps   . Complete heart block ([place])    a. 04/2015 in setting of inferior STEMI-->resolved.  . Hyperlipidemia   . Hypertension   . Ischemic cardiomyopathy    a. 04/2015 LV gram:  EF 35-40%; b. 09/2015 MV: EF 55-65%.  . Kidney stones    a. May 2016.  . MI (myocardial infarction) ([place]) 04/2015  . Postoperative atrial fibrillation ([place])    a. 04/2015 Post-op CABG AF/AFlutter--> converted with rapid atrial pacing and amio; b. CHA2DS2VASc = 3->Coumadin.  . 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 Abnormal gait 02/21/2019  . Primary localized osteoarthritis  of pelvic region and thigh 02/21/2019  . Osteoarthritis of knee 12/07/2016  . History of adenomatous polyp of colon 09/15/2015  . 1st degree AV block 09/14/2015  . Sciatica of right side 09/14/2015  . Bilateral kidney stones 09/14/2015  . Fam hx-ischem heart disease 09/14/2015  . Episodic lightheadedness 09/14/2015  . History of complete heart block   . Postoperative atrial fibrillation ([place])   . HLD (hyperlipidemia)   . HTN (hypertension)   . Coronary atherosclerosis   . History of acute inferior wall MI 05/03/2015  . S/P CABG x 4 05/03/2015  . Unilateral inguinal hernia without obstruction or gangrene 04/29/2015  . Ureterolithiasis 01/12/2015  . Benign localized prostatic hyperplasia with lower urinary tract symptoms (LUTS) 04/25/2012  . Urinary urgency 04/25/2012    Past Surgical History:  Procedure Laterality Date  . CARDIAC CATHETERIZATION N/A 05/03/2015   Procedure: Left Heart Cath and Coronary Angiography;  Surgeon: Wellington Hampshire, MD;  Location: [place] [facility];  Service: Cardiovascular;  Laterality: N/A;  . CATARACT EXTRACTION, BILATERAL    . CORONARY ARTERY BYPASS GRAFT N/A 05/03/2015   Procedure: CORONARY ARTERY BYPASS GRAFTING (CABG) times four using left internal mammary artery and right saphenous vein.;  Surgeon: [name], MD;  Location: [place];  Service: Open Heart Surgery;  Laterality: N/A;  . HERNIA REPAIR     ventral hernia repair  . JOINT REPLACEMENT    . KNEE SURGERY     arthroscopic knee surgery  . NECK SURGERY     Spinal fusion- neck. Dr. Sherley Bounds of [facility])  . right hip replacement Right 02/04/2014   Dr. Sabra Heck  . TEE WITHOUT CARDIOVERSION N/A 05/03/2015   Procedure: TRANSESOPHAGEAL ECHOCARDIOGRAM (TEE);  Surgeon: [name], MD;  Location: [place];  Service: Open Heart Surgery;  Laterality: N/A;    His family history includes CAD in his father, maternal uncle, and paternal uncle; Heart Problems in  his sister; Heart attack in his brother, brother, brother, brother, father, and sister.   Current Outpatient Medications:  .  aspirin 81 MG EC tablet, Take 1 tablet (81 mg total) by mouth daily., Disp: , Rfl:  .  atorvastatin (LIPITOR) 80 MG tablet, TAKE ONE TABLET BY MOUTH ONCE DAILY AT 6PM, Disp: 90 tablet, Rfl: 3 .  ENSURE PLUS (ENSURE PLUS) LIQD, Take 237 mLs by mouth daily., Disp: , Rfl:  .  lisinopril (PRINIVIL,ZESTRIL) 10 MG tablet, Take 1 tablet (10 mg total) by mouth daily., Disp: 90 tablet, Rfl: 3 .  Multiple Vitamin (MULTIVITAMIN) tablet, Take 1 tablet by mouth daily., Disp: , Rfl:  .  Omega-3 Fatty Acids (FISH OIL) 1200 MG CAPS, Take 1,200 mg by mouth daily., Disp: , Rfl:   Patient Care Team: Birdie Sons, MD as PCP - General (Family Medicine) Wellington Hampshire, MD as PCP - Cardiology (Cardiology) Earnestine Leys, MD as Consulting Physician (Specialist) Abbie Sons, MD (Urology) Ralene Bathe, MD (Dermatology)     Objective:    Vitals: BP 104/60  Pulse 67   Temp (!) 96.2 F (35.7 C) (Oral)   Resp 15   Ht 5\' 6"  (1.676 m)   Wt 201 lb 3.2 oz (91.3 kg)   SpO2 96%   BMI 32.47 kg/m   Physical Exam   General Appearance:    Alert, cooperative, no distress, appears stated age  Head:    Normocephalic, without obvious abnormality, atraumatic  Eyes:    PERRL, conjunctiva/corneas clear, EOM's intact, fundi    benign, both eyes       Ears:    Normal TM's and external ear canals, both ears  Nose:   Nares normal, septum midline, mucosa normal, no drainage   or sinus tenderness  Throat:   Lips, mucosa, and tongue normal; teeth and gums normal  Neck:   Supple, symmetrical, trachea midline, no adenopathy;       thyroid:  No enlargement/tenderness/nodules; no carotid   bruit or JVD  Back:     Symmetric, no curvature, ROM normal, no CVA tenderness  Lungs:     Clear to auscultation bilaterally, respirations unlabored  Chest wall:    No tenderness or deformity   Heart:    Normal heart rate. Normal rhythm. No murmurs, rubs, or gallops.  S1 and S2 normal  Abdomen:     Soft, non-tender, bowel sounds active all four quadrants,    no masses, no organomegaly  Genitalia:    deferred  Rectal:    deferred  Extremities:   All extremities are intact. No cyanosis or edema  Pulses:   2+ and symmetric all extremities  Skin:   Skin color, texture, turgor normal, no rashes or lesions  Lymph nodes:   Cervical, supraclavicular, and axillary nodes normal  Neurologic:   CNII-XII intact. Normal strength, sensation and reflexes      throughout    Activities of Daily Living In your present state of health, do you have any difficulty performing the following activities: 04/10/2019  Hearing? N  Vision? N  Difficulty concentrating or making decisions? N  Walking or climbing stairs? N  Dressing or bathing? N  Doing errands, shopping? N  Preparing Food and eating ? N  Using the Toilet? N  In the past six months, have you accidently leaked urine? Y  Comment Occasionally with urges. Has been seen by a urologist for this.  Do you have problems with loss of bowel control? N  Managing your Medications? N  Managing your Finances? N  Housekeeping or managing your Housekeeping? N  Some recent data might be hidden    Fall Risk Assessment Fall Risk  04/10/2019 04/05/2018 02/23/2017 09/15/2015 06/01/2015  Falls in the past year? 1 No Yes No No  Number falls in past yr: 0 - 1 - -  Comment - - tripped - -  Injury with Fall? 0 - No - -  Follow up Falls prevention discussed - Falls prevention discussed - -     Depression Screen PHQ 2/9 Scores 04/10/2019 04/05/2018 04/05/2018 02/23/2017  PHQ - 2 Score 0 0 0 0  PHQ- 9 Score - 1 - 3    6CIT Screen 02/23/2017  What Year? 0 points  What month? 0 points  What time? 0 points  Count back from 20 0 points  Months in reverse 0 points  Repeat phrase 0 points  Total Score 0       Assessment & Plan:    Annual Physical Reviewed  patient's Family Medical History Reviewed and updated list of patient's medical  providers Assessment of cognitive impairment was done Assessed patient's functional ability Established a written schedule for health screening [place] Completed and Reviewed  Exercise Activities and Dietary recommendations Goals    . Exercise 3x per week (30 min per time)     Recommend to exercise for 3 days a week for at least 30 minutes at a time.     Marland Kitchen LIFESTYLE - DECREASE FALLS RISK     Recommend to remove any items from the home that may cause slips or trips.       Immunization History  Administered Date(s) Administered  . Influenza, High Dose Seasonal PF 06/02/2017, 04/05/2018  . Influenza-Unspecified 05/07/2015  . Pneumococcal Conjugate-13 09/15/2015  . Pneumococcal Polysaccharide-23 02/23/2017  . Tdap 06/19/2008  . Zoster 09/15/2015    Health Maintenance  Topic Date Due  . COLONOSCOPY  01/13/2016  . TETANUS/TDAP  06/19/2018  . INFLUENZA VACCINE  03/08/2019  . Hepatitis C Screening  Completed  . PNA vac Low Risk Adult  Completed     Discussed health benefits of physical activity, and encouraged him to engage in regular exercise appropriate for his age and condition.    ------------------------------------------------------------------------------------------------------------ 1. Annual physical exam   2. Coronary artery disease involving native coronary artery of native heart with other form of angina pectoris ([place]) Asymptomatic. Compliant with medication.  Continue aggressive risk factor modification.  Follow up Dr. Fletcher Anon as scheduled.   3. Obesity (BMI 30.0-34.9) Counseled regarding prudent diet and regular exercise.   4. Need for prophylactic vaccination using tetanus and diphtheria toxoids adsorbed (Td) vaccine Advised to check with pharmacy re part d coverage   5. Prostate cancer screening  - PSA  6. Need for influenza vaccination  - Flu  Vaccine QUAD High Dose(Fluad)  7. Hyperlipidemia, unspecified hyperlipidemia type He is tolerating atorvastatin well with no adverse effects.   - Comprehensive metabolic panel - Lipid panel  8. 1st degree AV block Stable, continue routine cardiology follow up   9. History of adenomatous polyp of colon Advised he is overdue for follow up colonoscopy due to history pre-cancerous polyps last excised in 2012 by Dr. Donnella Sham. He is not interested in doing at this time since last time at was cancelled at the last minute due to not being off of blood thinners long enough. Advised he really should have this done and we could refer to another GI. He is going to hold off for the time being.     [name], MD  [facility]

## 2019-05-02 NOTE — Patient Instructions (Addendum)
.   Please review the attached list of medications and notify my office if there are any errors.   You are due for a Tdap (tetanus-diptheria-pertussis vaccine) which protects you from tetanus and whooping cough. Please check with your insurance plan or pharmacy regarding coverage for this vaccine.   The CDC recommends two doses of Shingrix (the shingles vaccine) separated by 2 to 6 months for adults age 70 years and older. I recommend checking with your insurance plan regarding coverage for this vaccine.    Your are due for a colonoscopy to follow up on pre-cancerous polyp found on your last colonoscopy. You can call back for referral to [facility] at your convenience to have this scheduled   Work on exercising average of 30 minutes a day and reducing portion sizes to lose 4-5 pounds a month to get your weight below 180 in the next 6 months.

## 2019-05-03 LAB — COMPREHENSIVE METABOLIC PANEL
ALT: 21 IU/L (ref 0–44)
AST: 19 IU/L (ref 0–40)
Albumin/Globulin Ratio: 2.1 (ref 1.2–2.2)
Albumin: 4.1 g/dL (ref 3.8–4.8)
Alkaline Phosphatase: 94 IU/L (ref 39–117)
BUN/Creatinine Ratio: 18 (ref 10–24)
BUN: 18 mg/dL (ref 8–27)
Bilirubin Total: 0.9 mg/dL (ref 0.0–1.2)
CO2: 22 mmol/L (ref 20–29)
Calcium: 9.1 mg/dL (ref 8.6–10.2)
Chloride: 104 mmol/L (ref 96–106)
Creatinine, Ser: 1 mg/dL (ref 0.76–1.27)
GFR calc Af Amer: 88 mL/min/{1.73_m2} (ref >59)
GFR calc non Af Amer: 76 mL/min/{1.73_m2} (ref >59)
Globulin, Total: 2 g/dL (ref 1.5–4.5)
Glucose: 109 mg/dL — ABNORMAL HIGH (ref 65–99)
Potassium: 4.6 mmol/L (ref 3.5–5.2)
Sodium: 140 mmol/L (ref 134–144)
Total Protein: 6.1 g/dL (ref 6.0–8.5)

## 2019-05-03 LAB — LIPID PANEL
Chol/HDL Ratio: 5.6 ratio — ABNORMAL HIGH (ref 0.0–5.0)
Cholesterol, Total: 152 mg/dL (ref 100–199)
HDL: 27 mg/dL — ABNORMAL LOW (ref >39)
LDL Chol Calc (NIH): 79 mg/dL (ref 0–99)
Triglycerides: 278 mg/dL — ABNORMAL HIGH (ref 0–149)
VLDL Cholesterol Cal: 46 mg/dL — ABNORMAL HIGH (ref 5–40)

## 2019-05-03 LAB — PSA: Prostate Specific Ag, Serum: 0.8 ng/mL (ref 0.0–4.0)

## 2019-05-13 ENCOUNTER — Telehealth: Payer: Self-pay | Admitting: Family Medicine

## 2019-05-13 ENCOUNTER — Telehealth: Payer: Self-pay

## 2019-05-13 DIAGNOSIS — Z86010 Personal history of colonic polyps: Secondary | ICD-10-CM

## 2019-05-13 NOTE — Telephone Encounter (Signed)
-----   Message from Birdie Sons, MD sent at 05/13/2019  3:15 PM EDT ----- Labs are good, psa is normal. Continue current medications.   Is due for colonoscopy for history of pre-cancerous polyps. Have sent referral order to [facility].

## 2019-05-13 NOTE — Telephone Encounter (Signed)
Tried calling patient. Left message to call back. 

## 2019-05-13 NOTE — Telephone Encounter (Signed)
-----   Message from Birdie Sons, MD sent at 05/02/2019  9:45 AM EDT ----- Regarding: REMIND HE IS DUE FOR COLONOSCOPY FOLLOW UP TUBULAR ADENOMA FROM 2012

## 2019-05-15 NOTE — Telephone Encounter (Signed)
LMTCB 05/15/2019  Thanks,   -Mickel Baas

## 2019-05-16 NOTE — Telephone Encounter (Signed)
Patient has been advised. KW 

## 2019-05-30 ENCOUNTER — Encounter: Payer: Self-pay | Admitting: *Deleted

## 2019-06-26 ENCOUNTER — Telehealth: Payer: Self-pay | Admitting: Cardiovascular Disease

## 2019-06-26 NOTE — Telephone Encounter (Signed)
Scheduled with Sharolyn Douglas 12/4

## 2019-06-26 NOTE — Telephone Encounter (Signed)
Patient was last seen in Dec 2019. He is due for a follow up appt. Patient will need to be seen by Cardiology before the DOT form can be completed and signed. Please call him to schedule an appt.

## 2019-06-26 NOTE — Telephone Encounter (Signed)
Patient dropped off DOT forms to be completed and signed  Please fax to [place] and make patient aware when completed Forms placed in nurse box

## 2019-07-07 ENCOUNTER — Encounter: Payer: PPO | Admitting: Family Medicine

## 2019-07-11 ENCOUNTER — Encounter: Payer: Self-pay | Admitting: Nurse Practitioner

## 2019-07-11 ENCOUNTER — Other Ambulatory Visit: Payer: Self-pay

## 2019-07-11 ENCOUNTER — Ambulatory Visit (INDEPENDENT_AMBULATORY_CARE_PROVIDER_SITE_OTHER): Payer: PPO | Admitting: Nurse Practitioner

## 2019-07-11 VITALS — BP 120/80 | HR 74 | Temp 97.2°F | Ht 66.0 in | Wt 203.1 lb

## 2019-07-11 DIAGNOSIS — E781 Pure hyperglyceridemia: Secondary | ICD-10-CM | POA: Diagnosis not present

## 2019-07-11 DIAGNOSIS — E785 Hyperlipidemia, unspecified: Secondary | ICD-10-CM

## 2019-07-11 DIAGNOSIS — I10 Essential (primary) hypertension: Secondary | ICD-10-CM | POA: Diagnosis not present

## 2019-07-11 DIAGNOSIS — I25810 Atherosclerosis of coronary artery bypass graft(s) without angina pectoris: Secondary | ICD-10-CM | POA: Diagnosis not present

## 2019-07-11 MED ORDER — ICOSAPENT ETHYL 1 G PO CAPS: 2.0000 g | ORAL_CAPSULE | Freq: Two times a day (BID) | ORAL | 3 refills | Status: DC

## 2019-07-11 NOTE — Patient Instructions (Signed)
Medication Instructions:  Your physician has recommended you make the following change in your medication:  1- STOP Fish Oil. 2- Vascepa 2 gm (2 cap) by mouth two times a day.  *If you need a refill on your cardiac medications before your next appointment, please call your pharmacy*  Lab Work: none If you have labs (blood work) drawn today and your tests are completely normal, you will receive your results only by: Marland Kitchen MyChart Message (if you have MyChart) OR . A paper copy in the mail If you have any lab test that is abnormal or we need to change your treatment, we will call you to review the results.  Testing/Procedures: none  Follow-Up: At [facility], you and your health needs are our priority.  As part of our continuing mission to provide you with exceptional heart care, we have created designated Provider Care Teams.  These Care Teams include your primary Cardiologist (physician) and Advanced Practice Providers (APPs -  Physician Assistants and Nurse Practitioners) who all work together to provide you with the care you need, when you need it.  Your next appointment:   12 month(s)  - Call us to come in for stress test in time to have done.   The format for your next appointment:   In Person  Provider:    You may see Kathlyn Sacramento, MD or one of the following Advanced Practice Providers on your designated Care Team:    Murray Hodgkins, NP  Christell Faith, PA-C  Marrianne Mood, PA-C  Medication Samples have been provided to the patient.  Drug name: Vascepa       Strength: 1 gm        Qty: 4 boxes  LOT: [id number]  Exp.Date: 11/2021

## 2019-07-11 NOTE — Progress Notes (Signed)
Office Visit    Patient Name: [name] Date of Encounter: 07/11/2019  Primary Care Provider:  Birdie Sons, MD Primary Cardiologist:  Kathlyn Sacramento, MD  Chief Complaint    68 year old male with a history of CAD status post four-vessel bypass following an inferior STEMI in September 2016, hypertension, and hyperlipidemia, who presents for annual follow-up of his CAD.  Past Medical History    Past Medical History:  Diagnosis Date  . CAD (coronary artery disease)    a. CT scan 01/02/2015: extensive coronary atherosclerosis;  b. 04/2015 Inf STEMI/Cath/CABG x 4: LIMA->LAD, VG->Diag, VG->OM2, VG->RPL; c. 09/2015 MV: EF 55-65%, infsept/inf infarct w/o ischemia->Low Risk.  . Colon polyps   . Complete heart block ([place])    a. 04/2015 in setting of inferior STEMI-->resolved.  . Hyperlipidemia   . Hypertension   . Ischemic cardiomyopathy    a. 04/2015 LV gram: EF 35-40%; b. 09/2015 MV: EF 55-65%.  . Kidney stones    a. May 2016.  . MI (myocardial infarction) ([place]) 04/2015  . Postoperative atrial fibrillation ([place])    a. 04/2015 Post-op CABG AF/AFlutter--> converted with rapid atrial pacing and amio; b. CHA2DS2VASc = 3->Coumadin.  . 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 CARDIAC CATHETERIZATION N/A 05/03/2015   Procedure: Left Heart Cath and Coronary Angiography;  Surgeon: Wellington Hampshire, MD;  Location: [place] [facility];  Service: Cardiovascular;  Laterality: N/A;  . CATARACT EXTRACTION, [place]  . CORONARY ARTERY BYPASS GRAFT N/A 05/03/2015   Procedure: CORONARY ARTERY BYPASS GRAFTING (CABG) times four using left internal mammary artery and right saphenous vein.;  Surgeon: [name], MD;  Location: [facility] [place];  Service: Open Heart Surgery;  Laterality: N/A;  . HERNIA REPAIR     ventral hernia repair  . JOINT REPLACEMENT    . KNEE SURGERY     arthroscopic knee surgery  . NECK SURGERY     Spinal fusion-  neck. Dr. Sherley Bounds of [facility])  . right hip replacement Right 02/04/2014   Dr. Sabra Heck  . TEE WITHOUT CARDIOVERSION N/A 05/03/2015   Procedure: TRANSESOPHAGEAL ECHOCARDIOGRAM (TEE);  Surgeon: [name], MD;  Location: [place];  Service: Open Heart Surgery;  Laterality: N/A;    Allergies  Allergies  Allergen Reactions  . Lovastatin Other (See Comments)    Chest pain  . Pravastatin Other (See Comments)    Nose bleed    History of Present Illness    68 year old male with the above past medical history including CAD status post inferior STEMI in September 2016, hypertension, and hyperlipidemia.  Catheterization at the time of STEMI revealed severe multivessel disease and was also complicated by complete heart block.  He required four-vessel bypass.  He did have postoperative atrial fibrillation and flutter and was on amiodarone and Coumadin for period of time postoperatively.  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  He was last seen in clinic in December 2019, at which time he was doing well.  He has continued to do well over the past year.  He continues to drive a truck and in his spare time enjoys working in his yard.  He says he is quite active in and around his house and denies chest pain or dyspnea.  Further, he denies PND, orthopnea, palpitations, dizziness, syncope, edema, or early satiety.  Home  Medications    Prior to Admission medications   Medication Sig Start Date End Date Taking? Authorizing Provider  aspirin 81 MG EC tablet Take 1 tablet (81 mg total) by mouth daily. 05/17/15   Theora Gianotti, NP  atorvastatin (LIPITOR) 80 MG tablet TAKE ONE TABLET BY MOUTH ONCE DAILY AT 6PM 08/14/18   Theora Gianotti, NP  ENSURE PLUS (ENSURE PLUS) LIQD Take 237 mLs by mouth daily.    [provider]  lisinopril (PRINIVIL,ZESTRIL) 10 MG tablet Take 1 tablet (10 mg total) by  mouth daily. 11/15/18   Birdie Sons, MD  Multiple Vitamin (MULTIVITAMIN) tablet Take 1 tablet by mouth daily.    [provider]  Omega-3 Fatty Acids (FISH OIL) 1200 MG CAPS Take 1,200 mg by mouth daily.    [provider]    Review of Systems    He denies chest pain, palpitations, dyspnea, pnd, orthopnea, n, v, dizziness, syncope, edema, weight gain, or early satiety.  All other systems reviewed and are otherwise negative except as noted above.  Physical Exam    VS:  BP 120/80 (BP Location: Left Arm, Patient Position: Sitting, Cuff Size: Normal)   Pulse 74   Temp (!) 97.2 F (36.2 C)   Ht 5\' 6"  (1.676 m)   Wt 203 lb 2 oz (92.1 kg)   BMI 32.79 kg/m  , BMI Body mass index is 32.79 kg/m. GEN: Well nourished, well developed, in no acute distress. HEENT: normal. Neck: Supple, no JVD, carotid bruits, or masses. Cardiac: RRR, no murmurs, rubs, or gallops. No clubbing, cyanosis, edema.  Radials/PT 2+ and equal bilaterally.  Respiratory:  Respirations regular and unlabored, clear to auscultation bilaterally. GI: Soft, nontender, nondistended, BS + x 4. MS: no deformity or atrophy. Skin: warm and dry, no rash. Neuro:  Strength and sensation are intact. Psych: Normal affect.  Accessory Clinical Findings    ECG personally reviewed by me today -regular sinus rhythm, 74, PVCs, prior inferior infarct- no acute changes.  Lab Results  Component Value Date   WBC 8.0 04/05/2018   HGB 13.5 04/05/2018   HCT 40.9 04/05/2018   MCV 83 04/05/2018   PLT 225 04/05/2018   Lab Results  Component Value Date   CREATININE 1.00 05/02/2019   BUN 18 05/02/2019   NA 140 05/02/2019   K 4.6 05/02/2019   CL 104 05/02/2019   CO2 22 05/02/2019   Lab Results  Component Value Date   ALT 21 05/02/2019   AST 19 05/02/2019   ALKPHOS 94 05/02/2019   BILITOT 0.9 05/02/2019   Lab Results  Component Value Date   CHOL 152 05/02/2019   HDL 27 (L) 05/02/2019   LDLCALC 79 05/02/2019    TRIG 278 (H) 05/02/2019   CHOLHDL 5.6 (H) 05/02/2019    Lab Results  Component Value Date   HGBA1C 6.1 (A) 10/17/2018    Assessment & Plan    1.  CAD: s/p CABG x 4 in 04/2015.  He has continued to do well over the past year and remains active without symptoms or limitations.  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  Given good exercise tolerance and absence of symptoms, he does not require any testing this year and I have signed his paperwork.  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  He is aware of this and will contact us  next September to arrange for his ETT.  In the interim, he remains on aspirin, statin, and ACE inhibitor therapy.  In the setting of ongoing hypertriglyceridemia and LDL not at goal, I am adding Vascepa 2 g twice daily to his regimen.  Samples provided today.  2. Essential HTN: Stable on ACE inhibitor therapy.  Recent normal basic metabolic panel in September.  3.  HL/HTG: TG 279 w/ LDL of 79 in Sept.  LFTs wnl.  Currently on high potency statin and Fish oil.  As above, I am going to discontinue fish oil and have prescribed Vascepa 2 g twice daily.  Samples provided.  We will plan to follow-up lipids and LFTs in about 6 weeks.  4.  Disposition: Follow-up lipids and LFTs in 6 weeks.  He will contact us if he has any trouble getting Vascepa through his insurance.  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  Murray Hodgkins, NP 07/11/2019, 3:20 PM

## 2019-07-15 ENCOUNTER — Telehealth: Payer: Self-pay | Admitting: Cardiovascular Disease

## 2019-07-15 NOTE — Telephone Encounter (Signed)
Pt c/o medication issue:  1. Name of Medication: Vascepa 2 g twice daily  2. How are you currently taking this medication (dosage and times per day)? 2 g bo BID  3. Are you having a reaction (difficulty breathing--STAT)? Nose bleed this morning that took a while to control   4. What is your medication issue?  Patient wife calling for patient .  Call patient cell to discuss decrease or stop dose .

## 2019-07-15 NOTE — Telephone Encounter (Signed)
Spoke with the patient. Patient sts that he has had 2 nose bleeds since starting Vascepa 2g daily on 07/11/19. Patient sts that the the second nose bleed occurred this morning and was difficult to control. Advised the patient that the therapeutic dosage recommended is 2g daily. Patient would like to try reducing the dosage. Advised the patient that he could try taking 1g daily for now. Advised the patient that I will fwd the update to the ordering provider Ignacia Bayley, NP and we will call back with his recommendation.  Patient is agreeable with poc and voiced appreciation for the call back.

## 2019-07-16 MED ORDER — ICOSAPENT ETHYL 1 G PO CAPS: ORAL_CAPSULE | ORAL | Status: DC

## 2019-07-16 NOTE — Telephone Encounter (Signed)
I spoke with the patient and advised him of Ignacia Bayley, NP's recommendations to: 1) Decrease vescepa to 1g once daily, then 2) if tolerating try increasing vescepa to 1g BID  The patient is agreeable with the above recommendations and voices understanding. I have asked him to try 1g daily x 1 week, then increase to 1g BID if tolerating.  He will call us back if any further issues.

## 2019-07-16 NOTE — Telephone Encounter (Signed)
Bleeding is a possible side effect.  He may try a lower dose, though outcomes data is based on the higher dose.  Given his lipid/TG and heart history, it is reasonable to try, with a low threshold to discontinue altogether for any recurrent nose bleeds.

## 2019-07-16 NOTE — Telephone Encounter (Signed)
[name], should he be taking 1g BID or QD for the "lower dose?"

## 2019-07-16 NOTE — Telephone Encounter (Signed)
Sorry for not being clearer.  He can drop to 1g daily and then if stable, increase to 1g bid.

## 2019-08-21 DIAGNOSIS — H26493 Other secondary cataract, bilateral: Secondary | ICD-10-CM | POA: Diagnosis not present

## 2019-09-15 ENCOUNTER — Encounter: Payer: Self-pay | Admitting: Urology

## 2019-09-15 ENCOUNTER — Ambulatory Visit
Admission: RE | Admit: 2019-09-15 | Discharge: 2019-09-15 | Disposition: A | Discharge status: Home / Self Care | Payer: PPO | Source: Ambulatory Visit | Attending: Urology | Admitting: Urology

## 2019-09-15 ENCOUNTER — Ambulatory Visit: Payer: PPO | Admitting: Urology

## 2019-09-15 ENCOUNTER — Other Ambulatory Visit
Admission: RE | Admit: 2019-09-15 | Discharge: 2019-09-15 | Disposition: A | Discharge status: Home / Self Care | Payer: PPO | Source: Ambulatory Visit | Attending: Urology | Admitting: Urology

## 2019-09-15 ENCOUNTER — Telehealth: Payer: Self-pay | Admitting: Radiology

## 2019-09-15 ENCOUNTER — Other Ambulatory Visit: Payer: Self-pay

## 2019-09-15 ENCOUNTER — Other Ambulatory Visit: Payer: Self-pay | Admitting: Radiology

## 2019-09-15 VITALS — BP 180/93 | HR 70 | Ht 66.0 in | Wt 200.0 lb

## 2019-09-15 DIAGNOSIS — N23 Unspecified renal colic: Secondary | ICD-10-CM | POA: Diagnosis not present

## 2019-09-15 DIAGNOSIS — N200 Calculus of kidney: Secondary | ICD-10-CM

## 2019-09-15 DIAGNOSIS — Z01812 Encounter for preprocedural laboratory examination: Secondary | ICD-10-CM | POA: Diagnosis not present

## 2019-09-15 DIAGNOSIS — Z20822 Contact with and (suspected) exposure to covid-19: Secondary | ICD-10-CM | POA: Diagnosis not present

## 2019-09-15 LAB — MICROSCOPIC EXAMINATION
Bacteria, UA: NONE SEEN
RBC, Urine: NONE SEEN /hpf (ref 0–2)

## 2019-09-15 LAB — URINALYSIS, COMPLETE
Bilirubin, UA: NEGATIVE
Glucose, UA: NEGATIVE
Ketones, UA: NEGATIVE
Leukocytes,UA: NEGATIVE
Nitrite, UA: NEGATIVE
Protein,UA: NEGATIVE
RBC, UA: NEGATIVE
Specific Gravity, UA: >1.03 — ABNORMAL HIGH (ref 1.005–1.030)
Urobilinogen, Ur: 0.2 mg/dL (ref 0.2–1.0)
pH, UA: 5 (ref 5.0–7.5)

## 2019-09-15 MED ORDER — CEFAZOLIN SODIUM-DEXTROSE 2-4 GM/100ML-% IV SOLN
2.0000 g | INTRAVENOUS | Status: AC
  Administered 2019-09-16: 2 g via INTRAVENOUS

## 2019-09-15 MED ORDER — ONDANSETRON HCL 4 MG PO TABS: 4.0000 mg | ORAL_TABLET | Freq: Three times a day (TID) | ORAL | 0 refills | Status: DC | PRN

## 2019-09-15 MED ORDER — HYDROCODONE-ACETAMINOPHEN 5-325 MG PO TABS: 1.0000 | ORAL_TABLET | Freq: Four times a day (QID) | ORAL | 0 refills | Status: DC | PRN

## 2019-09-15 NOTE — Telephone Encounter (Signed)
Patient requests pain & nausea medications to be sent to [facility] on [address]

## 2019-09-15 NOTE — Progress Notes (Signed)
09/15/2019 11:01 AM   [name] [birth year] [id number]  Referring provider: Birdie Sons, MD [address] [place],  [place] [address]  Chief Complaint  Patient presents with  . Nephrolithiasis    HPI: 69 y.o. male with a history of recurrent stone disease.  He had an asymptomatic left upper pole renal calculus and I last saw him July 2020.  He states 2 days ago he had onset of left side pain that radiated to the left flank.  It has been associated with nausea/vomiting.  He has had occasional chills but no documented fever.  He has no voiding complaints.  No identifiable precipitating or aggravating factors.  He had an old prescription for hydrocodone which he has been taking which has helped the pain.  KUB performed today was reviewed and the left renal calculus has migrated from the upper pole to the region of the UPJ/proximal ureter.   PMH: Past Medical History:  Diagnosis Date  . CAD (coronary artery disease)    a. CT scan 01/02/2015: extensive coronary atherosclerosis;  b. 04/2015 Inf STEMI/Cath/CABG x 4: LIMA->LAD, VG->Diag, VG->OM2, VG->RPL; c. 09/2015 MV: EF 55-65%, infsept/inf infarct w/o ischemia->Low Risk.  . Colon polyps   . Complete heart block ([place])    a. 04/2015 in setting of inferior STEMI-->resolved.  . Hyperlipidemia   . Hypertension   . Ischemic cardiomyopathy    a. 04/2015 LV gram: EF 35-40%; b. 09/2015 MV: EF 55-65%.  . Kidney stones    a. May 2016.  . MI (myocardial infarction) ([place]) 04/2015  . Postoperative atrial fibrillation ([place])    a. 04/2015 Post-op CABG AF/AFlutter--> converted with rapid atrial pacing and amio; b. CHA2DS2VASc = 3->Coumadin.  . 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 CARDIAC CATHETERIZATION N/A 05/03/2015   Procedure: Left Heart Cath and Coronary Angiography;  Surgeon: Wellington Hampshire, MD;  Location: [facility];  Service: Cardiovascular;  Laterality:  N/A;  . CATARACT EXTRACTION, [place]  . CORONARY ARTERY BYPASS GRAFT N/A 05/03/2015   Procedure: CORONARY ARTERY BYPASS GRAFTING (CABG) times four using left internal mammary artery and right saphenous vein.;  Surgeon: [name], MD;  Location: [facility];  Service: Open Heart Surgery;  Laterality: N/A;  . HERNIA REPAIR     ventral hernia repair  . JOINT REPLACEMENT    . KNEE SURGERY     arthroscopic knee surgery  . NECK SURGERY     Spinal fusion- neck. Dr. Sherley Bounds of [facility])  . right hip replacement Right 02/04/2014   Dr. Sabra Heck  . TEE WITHOUT CARDIOVERSION N/A 05/03/2015   Procedure: TRANSESOPHAGEAL ECHOCARDIOGRAM (TEE);  Surgeon: [name], MD;  Location: [facility];  Service: Open Heart Surgery;  Laterality: N/A;    Home Medications:  Allergies as of 09/15/2019      Reactions   Lovastatin Other (See Comments)   Chest pain   Pravastatin Other (See Comments)   Nose bleed      Medication List       Accurate as of September 15, 2019 11:01 AM. If you have any questions, ask your nurse or doctor.        aspirin 81 MG EC tablet Take 1 tablet (81 mg total) by mouth daily.   atorvastatin 80 MG tablet Commonly known as: LIPITOR TAKE ONE TABLET BY MOUTH ONCE DAILY AT [facility]  Ensure Plus Liqd Take 237 mLs by mouth daily.   icosapent Ethyl 1 g capsule Commonly known as: VASCEPA Take 1 capsule (1 gram) by mouth twice daily   lisinopril 10 MG tablet Commonly known as: ZESTRIL Take 1 tablet (10 mg total) by mouth daily.   multivitamin tablet Take 1 tablet by mouth daily.       Allergies:  Allergies  Allergen Reactions  . Lovastatin Other (See Comments)    Chest pain  . Pravastatin Other (See Comments)    Nose bleed    Family History: Family History  Problem Relation Age of Onset  . CAD Father   . Heart attack Father   . Heart Problems Sister        CABG  . Heart attack Brother   . Heart attack  Brother   . Heart attack Brother   . Heart attack Brother   . Heart attack Sister   . CAD Maternal Uncle   . CAD Paternal Uncle     Social History:  reports that he has never smoked. He has never used smokeless tobacco. He reports previous alcohol use. He reports that he does not use drugs.  ROS: UROLOGY Frequent Urination?: No Hard to postpone urination?: No Burning/pain with urination?: No Get up at night to urinate?: No Leakage of urine?: No Urine stream starts and stops?: No Trouble starting stream?: No Do you have to strain to urinate?: No Blood in urine?: No Urinary tract infection?: No Sexually transmitted disease?: No Injury to kidneys or bladder?: No Painful intercourse?: No Weak stream?: No Erection problems?: No Penile pain?: No  Gastrointestinal Nausea?: Yes Vomiting?: Yes Indigestion/heartburn?: No Diarrhea?: No Constipation?: No  Constitutional Fever: No Night sweats?: No Weight loss?: No Fatigue?: Yes  Skin Skin rash/lesions?: No Itching?: No  Eyes Blurred vision?: No Double vision?: No  Ears/Nose/Throat Sore throat?: No Sinus problems?: No  Hematologic/Lymphatic Swollen glands?: No Easy bruising?: No  Cardiovascular Leg swelling?: No Chest pain?: No  Respiratory Cough?: No Shortness of breath?: No  Endocrine Excessive thirst?: No  Musculoskeletal Back pain?: Yes Joint pain?: No  Neurological Headaches?: No Dizziness?: Yes  Psychologic Depression?: No Anxiety?: No  Physical Exam: BP (!) 180/93   Pulse 70   Ht 5\' 6"  (1.676 m)   Wt 200 lb (90.7 kg)   BMI 32.28 kg/m   Constitutional:  Alert and oriented, No acute distress. HEENT: [place] AT, moist mucus membranes.  Trachea midline, no masses. Cardiovascular: No clubbing, cyanosis, or edema.  RRR Respiratory: Normal respiratory effort, no increased work of breathing.  Clear GI: Abdomen is soft, nontender, nondistended, no abdominal masses GU: Mild left CVA  tenderness Skin: No rashes, bruises or suspicious lesions. Neurologic: Grossly intact, no focal deficits, moving all 4 extremities. Psychiatric: Normal mood and affect.  Laboratory Data:  Urinalysis Dipstick/microscopy negative  Assessment & Plan:    - Renal colic Migration of 7 mm calculus from the upper pole to the region of the UPJ.  Management options were discussed including shockwave lithotripsy and ureteroscopy.  We discussed the pros and cons of each treatment.  We discussed various treatment options for urolithiasis including observation with or without medical expulsive therapy, shockwave lithotripsy (SWL), ureteroscopy and laser lithotripsy with stent placement, and percutaneous nephrolithotomy.  We discussed that management is based on stone size, location, density, patient co-morbidities, and patient preference.   Stones <39mm in size have a >80% spontaneous passage rate. Data surrounding the use of tamsulosin for medical expulsive therapy is  controversial, but meta analyses suggests it is most efficacious for distal stones between 5-8mm in size. Possible side effects include dizziness/lightheadedness, and retrograde ejaculation.  SWL has a lower stone free rate in a single procedure, but also a lower complication rate compared to ureteroscopy and avoids a stent and associated stent related symptoms. Possible complications include renal hematoma, steinstrasse, and need for additional treatment.  Ureteroscopy with laser lithotripsy and stent placement has a higher stone free rate than SWL in a single procedure, however increased complication rate including possible infection, ureteral injury, bleeding, and stent related morbidity. Common stent related symptoms include dysuria, urgency/frequency, and flank pain.  After an extensive discussion of the risks and benefits of the above treatment options, the patient would like to proceed with ureteroscopic removal tomorrow.  The  indications and nature of the planned procedure were discussed as well as the potential  benefits and expected outcome.  Alternatives have been discussed in detail. The most common complications and side effects were discussed including but not limited to infection/sepsis; blood loss; damage to urethra, bladder, ureter, kidney; need for prolonged stent placement as well as general anesthesia risks. Although uncommon he was also informed of the possibility that the calculus may not be able to be treated due to inability to obtain access to the upper ureter. In that event he would require stent placement and a follow-up procedure after a period of stent dilation. All of his questions were answered and he desires to proceed.   Prescriptions for hydrocodone and Zofran were sent to his pharmacy.   Abbie Sons, [place] [address] [place], [place] [address] [phone]

## 2019-09-15 NOTE — H&P (View-Only) (Signed)
09/15/2019 11:01 AM   [name] 28-Jan-1951 [id number]  Referring provider: Birdie Sons, MD [address] [place],  [place] [address]  Chief Complaint  Patient presents with  . Nephrolithiasis    HPI: 69 y.o. male with a history of recurrent stone disease.  He had an asymptomatic left upper pole renal calculus and I last saw him July 2020.  He states 2 days ago he had onset of left side pain that radiated to the left flank.  It has been associated with nausea/vomiting.  He has had occasional chills but no documented fever.  He has no voiding complaints.  No identifiable precipitating or aggravating factors.  He had an old prescription for hydrocodone which he has been taking which has helped the pain.  KUB performed today was reviewed and the left renal calculus has migrated from the upper pole to the region of the UPJ/proximal ureter.   PMH: Past Medical History:  Diagnosis Date  . CAD (coronary artery disease)    a. CT scan 01/02/2015: extensive coronary atherosclerosis;  b. 04/2015 Inf STEMI/Cath/CABG x 4: LIMA->LAD, VG->Diag, VG->OM2, VG->RPL; c. 09/2015 MV: EF 55-65%, infsept/inf infarct w/o ischemia->Low Risk.  . Colon polyps   . Complete heart block ([place])    a. 04/2015 in setting of inferior STEMI-->resolved.  . Hyperlipidemia   . Hypertension   . Ischemic cardiomyopathy    a. 04/2015 LV gram: EF 35-40%; b. 09/2015 MV: EF 55-65%.  . Kidney stones    a. May 2016.  . MI (myocardial infarction) ([place]) 04/2015  . Postoperative atrial fibrillation ([place])    a. 04/2015 Post-op CABG AF/AFlutter--> converted with rapid atrial pacing and amio; b. CHA2DS2VASc = 3->Coumadin.  . 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 CARDIAC CATHETERIZATION N/A 05/03/2015   Procedure: Left Heart Cath and Coronary Angiography;  Surgeon: Wellington Hampshire, MD;  Location: [facility];  Service: Cardiovascular;  Laterality:  N/A;  . CATARACT EXTRACTION, [place]  . CORONARY ARTERY BYPASS GRAFT N/A 05/03/2015   Procedure: CORONARY ARTERY BYPASS GRAFTING (CABG) times four using left internal mammary artery and right saphenous vein.;  Surgeon: [name], MD;  Location: [facility];  Service: Open Heart Surgery;  Laterality: N/A;  . HERNIA REPAIR     ventral hernia repair  . JOINT REPLACEMENT    . KNEE SURGERY     arthroscopic knee surgery  . NECK SURGERY     Spinal fusion- neck. Dr. Sherley Bounds of [facility])  . right hip replacement Right 02/04/2014   Dr. Sabra Heck  . TEE WITHOUT CARDIOVERSION N/A 05/03/2015   Procedure: TRANSESOPHAGEAL ECHOCARDIOGRAM (TEE);  Surgeon: [name], MD;  Location: [facility];  Service: Open Heart Surgery;  Laterality: N/A;    Home Medications:  Allergies as of 09/15/2019      Reactions   Lovastatin Other (See Comments)   Chest pain   Pravastatin Other (See Comments)   Nose bleed      Medication List       Accurate as of September 15, 2019 11:01 AM. If you have any questions, ask your nurse or doctor.        aspirin 81 MG EC tablet Take 1 tablet (81 mg total) by mouth daily.   atorvastatin 80 MG tablet Commonly known as: LIPITOR TAKE ONE TABLET BY MOUTH ONCE DAILY AT [facility]  Ensure Plus Liqd Take 237 mLs by mouth daily.   icosapent Ethyl 1 g capsule Commonly known as: VASCEPA Take 1 capsule (1 gram) by mouth twice daily   lisinopril 10 MG tablet Commonly known as: ZESTRIL Take 1 tablet (10 mg total) by mouth daily.   multivitamin tablet Take 1 tablet by mouth daily.       Allergies:  Allergies  Allergen Reactions  . Lovastatin Other (See Comments)    Chest pain  . Pravastatin Other (See Comments)    Nose bleed    Family History: Family History  Problem Relation Age of Onset  . CAD Father   . Heart attack Father   . Heart Problems Sister        CABG  . Heart attack Brother   . Heart attack  Brother   . Heart attack Brother   . Heart attack Brother   . Heart attack Sister   . CAD Maternal Uncle   . CAD Paternal Uncle     Social History:  reports that he has never smoked. He has never used smokeless tobacco. He reports previous alcohol use. He reports that he does not use drugs.  ROS: UROLOGY Frequent Urination?: No Hard to postpone urination?: No Burning/pain with urination?: No Get up at night to urinate?: No Leakage of urine?: No Urine stream starts and stops?: No Trouble starting stream?: No Do you have to strain to urinate?: No Blood in urine?: No Urinary tract infection?: No Sexually transmitted disease?: No Injury to kidneys or bladder?: No Painful intercourse?: No Weak stream?: No Erection problems?: No Penile pain?: No  Gastrointestinal Nausea?: Yes Vomiting?: Yes Indigestion/heartburn?: No Diarrhea?: No Constipation?: No  Constitutional Fever: No Night sweats?: No Weight loss?: No Fatigue?: Yes  Skin Skin rash/lesions?: No Itching?: No  Eyes Blurred vision?: No Double vision?: No  Ears/Nose/Throat Sore throat?: No Sinus problems?: No  Hematologic/Lymphatic Swollen glands?: No Easy bruising?: No  Cardiovascular Leg swelling?: No Chest pain?: No  Respiratory Cough?: No Shortness of breath?: No  Endocrine Excessive thirst?: No  Musculoskeletal Back pain?: Yes Joint pain?: No  Neurological Headaches?: No Dizziness?: Yes  Psychologic Depression?: No Anxiety?: No  Physical Exam: BP (!) 180/93   Pulse 70   Ht 5\' 6"  (1.676 m)   Wt 200 lb (90.7 kg)   BMI 32.28 kg/m   Constitutional:  Alert and oriented, No acute distress. HEENT: [place] AT, moist mucus membranes.  Trachea midline, no masses. Cardiovascular: No clubbing, cyanosis, or edema.  RRR Respiratory: Normal respiratory effort, no increased work of breathing.  Clear GI: Abdomen is soft, nontender, nondistended, no abdominal masses GU: Mild left CVA tenderness  Skin: No rashes, bruises or suspicious lesions. Neurologic: Grossly intact, no focal deficits, moving all 4 extremities. Psychiatric: Normal mood and affect.  Laboratory Data:  Urinalysis Dipstick/microscopy negative  Assessment & Plan:    - Renal colic Migration of 7 mm calculus from the upper pole to the region of the UPJ.  Management options were discussed including shockwave lithotripsy and ureteroscopy.  We discussed the pros and cons of each treatment.  We discussed various treatment options for urolithiasis including observation with or without medical expulsive therapy, shockwave lithotripsy (SWL), ureteroscopy and laser lithotripsy with stent placement, and percutaneous nephrolithotomy.  We discussed that management is based on stone size, location, density, patient co-morbidities, and patient preference.   Stones <108mm in size have a >80% spontaneous passage rate. Data surrounding the use of tamsulosin for medical expulsive therapy is  controversial, but meta analyses suggests it is most efficacious for distal stones between 5-102mm in size. Possible side effects include dizziness/lightheadedness, and retrograde ejaculation.  SWL has a lower stone free rate in a single procedure, but also a lower complication rate compared to ureteroscopy and avoids a stent and associated stent related symptoms. Possible complications include renal hematoma, steinstrasse, and need for additional treatment.  Ureteroscopy with laser lithotripsy and stent placement has a higher stone free rate than SWL in a single procedure, however increased complication rate including possible infection, ureteral injury, bleeding, and stent related morbidity. Common stent related symptoms include dysuria, urgency/frequency, and flank pain.  After an extensive discussion of the risks and benefits of the above treatment options, the patient would like to proceed with ureteroscopic removal tomorrow.  The indications and  nature of the planned procedure were discussed as well as the potential  benefits and expected outcome.  Alternatives have been discussed in detail. The most common complications and side effects were discussed including but not limited to infection/sepsis; blood loss; damage to urethra, bladder, ureter, kidney; need for prolonged stent placement as well as general anesthesia risks. Although uncommon he was also informed of the possibility that the calculus may not be able to be treated due to inability to obtain access to the upper ureter. In that event he would require stent placement and a follow-up procedure after a period of stent dilation. All of his questions were answered and he desires to proceed.   Prescriptions for hydrocodone and Zofran were sent to his pharmacy.   Abbie Sons, [place] [address] [place], [place] [address] [phone]

## 2019-09-15 NOTE — H&P (View-Only) (Signed)
09/15/2019 11:01 AM   [name] [birth year] [id number]  Referring provider: Birdie Sons, MD [address] [place],  [place] [address]  Chief Complaint  Patient presents with  . Nephrolithiasis    HPI: 68 y.o. male with a history of recurrent stone disease.  He had an asymptomatic left upper pole renal calculus and I last saw him July 2020.  He states 2 days ago he had onset of left side pain that radiated to the left flank.  It has been associated with nausea/vomiting.  He has had occasional chills but no documented fever.  He has no voiding complaints.  No identifiable precipitating or aggravating factors.  He had an old prescription for hydrocodone which he has been taking which has helped the pain.  KUB performed today was reviewed and the left renal calculus has migrated from the upper pole to the region of the UPJ/proximal ureter.   PMH: Past Medical History:  Diagnosis Date  . CAD (coronary artery disease)    a. CT scan 01/02/2015: extensive coronary atherosclerosis;  b. 04/2015 Inf STEMI/Cath/CABG x 4: LIMA->LAD, VG->Diag, VG->OM2, VG->RPL; c. 09/2015 MV: EF 55-65%, infsept/inf infarct w/o ischemia->Low Risk.  . Colon polyps   . Complete heart block ([facility])    a. 04/2015 in setting of inferior STEMI-->resolved.  . Hyperlipidemia   . Hypertension   . Ischemic cardiomyopathy    a. 04/2015 LV gram: EF 35-40%; b. 09/2015 MV: EF 55-65%.  . Kidney stones    a. May 2016.  . MI (myocardial infarction) ([facility]) 04/2015  . Postoperative atrial fibrillation ([place])    a. 04/2015 Post-op CABG AF/AFlutter--> converted with rapid atrial pacing and amio; b. CHA2DS2VASc = 3->Coumadin.  . 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 CARDIAC CATHETERIZATION N/A 05/03/2015   Procedure: Left Heart Cath and Coronary Angiography;  Surgeon: Wellington Hampshire, MD;  Location: [facility];  Service: Cardiovascular;  Laterality:  N/A;  . CATARACT EXTRACTION, [place]  . CORONARY ARTERY BYPASS GRAFT N/A 05/03/2015   Procedure: CORONARY ARTERY BYPASS GRAFTING (CABG) times four using left internal mammary artery and right saphenous vein.;  Surgeon: [name], MD;  Location: [facility];  Service: Open Heart Surgery;  Laterality: N/A;  . HERNIA REPAIR     ventral hernia repair  . JOINT REPLACEMENT    . KNEE SURGERY     arthroscopic knee surgery  . NECK SURGERY     Spinal fusion- neck. Dr. Sherley Bounds of [facility])  . right hip replacement Right 02/04/2014   Dr. Sabra Heck  . TEE WITHOUT CARDIOVERSION N/A 05/03/2015   Procedure: TRANSESOPHAGEAL ECHOCARDIOGRAM (TEE);  Surgeon: [name], MD;  Location: [facility];  Service: Open Heart Surgery;  Laterality: N/A;    Home Medications:  Allergies as of 09/15/2019      Reactions   Lovastatin Other (See Comments)   Chest pain   Pravastatin Other (See Comments)   Nose bleed      Medication List       Accurate as of September 15, 2019 11:01 AM. If you have any questions, ask your nurse or doctor.        aspirin 81 MG EC tablet Take 1 tablet (81 mg total) by mouth daily.   atorvastatin 80 MG tablet Commonly known as: LIPITOR TAKE ONE TABLET BY MOUTH ONCE DAILY AT [facility]  Ensure Plus Liqd Take 237 mLs by mouth daily.   icosapent Ethyl 1 g capsule Commonly known as: VASCEPA Take 1 capsule (1 gram) by mouth twice daily   lisinopril 10 MG tablet Commonly known as: ZESTRIL Take 1 tablet (10 mg total) by mouth daily.   multivitamin tablet Take 1 tablet by mouth daily.       Allergies:  Allergies  Allergen Reactions  . Lovastatin Other (See Comments)    Chest pain  . Pravastatin Other (See Comments)    Nose bleed    Family History: Family History  Problem Relation Age of Onset  . CAD Father   . Heart attack Father   . Heart Problems Sister        CABG  . Heart attack Brother   . Heart attack  Brother   . Heart attack Brother   . Heart attack Brother   . Heart attack Sister   . CAD Maternal Uncle   . CAD Paternal Uncle     Social History:  reports that he has never smoked. He has never used smokeless tobacco. He reports previous alcohol use. He reports that he does not use drugs.  ROS: UROLOGY Frequent Urination?: No Hard to postpone urination?: No Burning/pain with urination?: No Get up at night to urinate?: No Leakage of urine?: No Urine stream starts and stops?: No Trouble starting stream?: No Do you have to strain to urinate?: No Blood in urine?: No Urinary tract infection?: No Sexually transmitted disease?: No Injury to kidneys or bladder?: No Painful intercourse?: No Weak stream?: No Erection problems?: No Penile pain?: No  Gastrointestinal Nausea?: Yes Vomiting?: Yes Indigestion/heartburn?: No Diarrhea?: No Constipation?: No  Constitutional Fever: No Night sweats?: No Weight loss?: No Fatigue?: Yes  Skin Skin rash/lesions?: No Itching?: No  Eyes Blurred vision?: No Double vision?: No  Ears/Nose/Throat Sore throat?: No Sinus problems?: No  Hematologic/Lymphatic Swollen glands?: No Easy bruising?: No  Cardiovascular Leg swelling?: No Chest pain?: No  Respiratory Cough?: No Shortness of breath?: No  Endocrine Excessive thirst?: No  Musculoskeletal Back pain?: Yes Joint pain?: No  Neurological Headaches?: No Dizziness?: Yes  Psychologic Depression?: No Anxiety?: No  Physical Exam: BP (!) 180/93   Pulse 70   Ht 5\' 6"  (1.676 m)   Wt 200 lb (90.7 kg)   BMI 32.28 kg/m   Constitutional:  Alert and oriented, No acute distress. HEENT: [place] AT, moist mucus membranes.  Trachea midline, no masses. Cardiovascular: No clubbing, cyanosis, or edema.  RRR Respiratory: Normal respiratory effort, no increased work of breathing.  Clear GI: Abdomen is soft, nontender, nondistended, no abdominal masses GU: Mild left CVA  tenderness Skin: No rashes, bruises or suspicious lesions. Neurologic: Grossly intact, no focal deficits, moving all 4 extremities. Psychiatric: Normal mood and affect.  Laboratory Data:  Urinalysis Dipstick/microscopy negative  Assessment & Plan:    - Renal colic Migration of 7 mm calculus from the upper pole to the region of the UPJ.  Management options were discussed including shockwave lithotripsy and ureteroscopy.  We discussed the pros and cons of each treatment.  We discussed various treatment options for urolithiasis including observation with or without medical expulsive therapy, shockwave lithotripsy (SWL), ureteroscopy and laser lithotripsy with stent placement, and percutaneous nephrolithotomy.  We discussed that management is based on stone size, location, density, patient co-morbidities, and patient preference.   Stones <52mm in size have a >80% spontaneous passage rate. Data surrounding the use of tamsulosin for medical expulsive therapy is  controversial, but meta analyses suggests it is most efficacious for distal stones between 5-18mm in size. Possible side effects include dizziness/lightheadedness, and retrograde ejaculation.  SWL has a lower stone free rate in a single procedure, but also a lower complication rate compared to ureteroscopy and avoids a stent and associated stent related symptoms. Possible complications include renal hematoma, steinstrasse, and need for additional treatment.  Ureteroscopy with laser lithotripsy and stent placement has a higher stone free rate than SWL in a single procedure, however increased complication rate including possible infection, ureteral injury, bleeding, and stent related morbidity. Common stent related symptoms include dysuria, urgency/frequency, and flank pain.  After an extensive discussion of the risks and benefits of the above treatment options, the patient would like to proceed with ureteroscopic removal tomorrow.  The  indications and nature of the planned procedure were discussed as well as the potential  benefits and expected outcome.  Alternatives have been discussed in detail. The most common complications and side effects were discussed including but not limited to infection/sepsis; blood loss; damage to urethra, bladder, ureter, kidney; need for prolonged stent placement as well as general anesthesia risks. Although uncommon he was also informed of the possibility that the calculus may not be able to be treated due to inability to obtain access to the upper ureter. In that event he would require stent placement and a follow-up procedure after a period of stent dilation. All of his questions were answered and he desires to proceed.   Prescriptions for hydrocodone and Zofran were sent to his pharmacy.   Abbie Sons, [place] [address], [place], [place] [address] [phone]

## 2019-09-16 ENCOUNTER — Ambulatory Visit: Payer: PPO | Admitting: Anesthesiology

## 2019-09-16 ENCOUNTER — Encounter
Admission: RE | Disposition: A | Discharge status: Home / Self Care | Payer: Self-pay | Source: Home / Self Care | Attending: Urology

## 2019-09-16 ENCOUNTER — Encounter: Payer: Self-pay | Admitting: Urology

## 2019-09-16 ENCOUNTER — Ambulatory Visit: Payer: PPO

## 2019-09-16 ENCOUNTER — Ambulatory Visit
Admission: RE | Admit: 2019-09-16 | Discharge: 2019-09-16 | Disposition: A | Discharge status: Home / Self Care | Payer: PPO | Attending: Urology | Admitting: Urology

## 2019-09-16 DIAGNOSIS — N23 Unspecified renal colic: Secondary | ICD-10-CM

## 2019-09-16 DIAGNOSIS — E785 Hyperlipidemia, unspecified: Secondary | ICD-10-CM | POA: Diagnosis not present

## 2019-09-16 DIAGNOSIS — I252 Old myocardial infarction: Secondary | ICD-10-CM | POA: Insufficient documentation

## 2019-09-16 DIAGNOSIS — I10 Essential (primary) hypertension: Secondary | ICD-10-CM | POA: Insufficient documentation

## 2019-09-16 DIAGNOSIS — Z7982 Long term (current) use of aspirin: Secondary | ICD-10-CM | POA: Diagnosis not present

## 2019-09-16 DIAGNOSIS — N200 Calculus of kidney: Secondary | ICD-10-CM | POA: Diagnosis not present

## 2019-09-16 DIAGNOSIS — Z87442 Personal history of urinary calculi: Secondary | ICD-10-CM | POA: Insufficient documentation

## 2019-09-16 DIAGNOSIS — I2510 Atherosclerotic heart disease of native coronary artery without angina pectoris: Secondary | ICD-10-CM | POA: Diagnosis not present

## 2019-09-16 DIAGNOSIS — Z96641 Presence of right artificial hip joint: Secondary | ICD-10-CM | POA: Diagnosis not present

## 2019-09-16 DIAGNOSIS — Z79899 Other long term (current) drug therapy: Secondary | ICD-10-CM | POA: Insufficient documentation

## 2019-09-16 DIAGNOSIS — Z951 Presence of aortocoronary bypass graft: Secondary | ICD-10-CM | POA: Insufficient documentation

## 2019-09-16 DIAGNOSIS — I255 Ischemic cardiomyopathy: Secondary | ICD-10-CM | POA: Insufficient documentation

## 2019-09-16 HISTORY — DX: Failed or difficult intubation, initial encounter: T88.4XXA

## 2019-09-16 HISTORY — PX: CYSTOSCOPY/RETROGRADE/URETEROSCOPY: SHX5316

## 2019-09-16 HISTORY — PX: CYSTOSCOPY WITH STENT PLACEMENT: SHX5790

## 2019-09-16 LAB — CBC
HCT: 41.5 % (ref 39.0–52.0)
Hemoglobin: 13.4 g/dL (ref 13.0–17.0)
MCH: 26.8 pg (ref 26.0–34.0)
MCHC: 32.3 g/dL (ref 30.0–36.0)
MCV: 83 fL (ref 80.0–100.0)
Platelets: 201 10*3/uL (ref 150–400)
RBC: 5 MIL/uL (ref 4.22–5.81)
RDW: 15.6 % — ABNORMAL HIGH (ref 11.5–15.5)
WBC: 15.3 10*3/uL — ABNORMAL HIGH (ref 4.0–10.5)
nRBC: 0 % (ref 0.0–0.2)

## 2019-09-16 LAB — BASIC METABOLIC PANEL
Anion gap: 10 (ref 5–15)
BUN: 20 mg/dL (ref 8–23)
CO2: 26 mmol/L (ref 22–32)
Calcium: 8.7 mg/dL — ABNORMAL LOW (ref 8.9–10.3)
Chloride: 99 mmol/L (ref 98–111)
Creatinine, Ser: 1.46 mg/dL — ABNORMAL HIGH (ref 0.61–1.24)
GFR calc Af Amer: 56 mL/min — ABNORMAL LOW (ref >60)
GFR calc non Af Amer: 48 mL/min — ABNORMAL LOW (ref >60)
Glucose, Bld: 145 mg/dL — ABNORMAL HIGH (ref 70–99)
Potassium: 4.2 mmol/L (ref 3.5–5.1)
Sodium: 135 mmol/L (ref 135–145)

## 2019-09-16 LAB — SARS CORONAVIRUS 2 (TAT 6-24 HRS): SARS Coronavirus 2: NEGATIVE

## 2019-09-16 SURGERY — CYSTOSCOPY/RETROGRADE/URETEROSCOPY
Anesthesia: General | Site: Ureter | Laterality: Left

## 2019-09-16 MED ORDER — DEXAMETHASONE SODIUM PHOSPHATE 10 MG/ML IJ SOLN
INTRAMUSCULAR | Status: AC
  Filled 2019-09-16: qty 1

## 2019-09-16 MED ORDER — LACTATED RINGERS IV SOLN: INTRAVENOUS | Status: DC

## 2019-09-16 MED ORDER — TAMSULOSIN HCL 0.4 MG PO CAPS: 0.4000 mg | ORAL_CAPSULE | Freq: Every day | ORAL | 0 refills | Status: DC

## 2019-09-16 MED ORDER — LIDOCAINE HCL (CARDIAC) PF 100 MG/5ML IV SOSY
PREFILLED_SYRINGE | INTRAVENOUS | Status: DC | PRN
  Administered 2019-09-16: 80 mg via INTRAVENOUS

## 2019-09-16 MED ORDER — ROCURONIUM BROMIDE 100 MG/10ML IV SOLN
INTRAVENOUS | Status: DC | PRN
  Administered 2019-09-16 (×2): 20 mg via INTRAVENOUS

## 2019-09-16 MED ORDER — SUGAMMADEX SODIUM 200 MG/2ML IV SOLN
INTRAVENOUS | Status: DC | PRN
  Administered 2019-09-16: 375 mg via INTRAVENOUS

## 2019-09-16 MED ORDER — IOHEXOL 180 MG/ML  SOLN
INTRAMUSCULAR | Status: DC | PRN
  Administered 2019-09-16: 20 mL

## 2019-09-16 MED ORDER — FENTANYL CITRATE (PF) 100 MCG/2ML IJ SOLN: 25.0000 ug | INTRAMUSCULAR | Status: DC | PRN

## 2019-09-16 MED ORDER — ONDANSETRON HCL 4 MG/2ML IJ SOLN
INTRAMUSCULAR | Status: AC
  Filled 2019-09-16: qty 2

## 2019-09-16 MED ORDER — SUCCINYLCHOLINE CHLORIDE 20 MG/ML IJ SOLN
INTRAMUSCULAR | Status: DC | PRN
  Administered 2019-09-16: 100 mg via INTRAVENOUS

## 2019-09-16 MED ORDER — CEFAZOLIN SODIUM-DEXTROSE 2-4 GM/100ML-% IV SOLN
INTRAVENOUS | Status: AC
  Filled 2019-09-16: qty 100

## 2019-09-16 MED ORDER — DEXAMETHASONE SODIUM PHOSPHATE 10 MG/ML IJ SOLN
INTRAMUSCULAR | Status: DC | PRN
  Administered 2019-09-16: 10 mg via INTRAVENOUS

## 2019-09-16 MED ORDER — FENTANYL CITRATE (PF) 100 MCG/2ML IJ SOLN
INTRAMUSCULAR | Status: AC
  Filled 2019-09-16: qty 2

## 2019-09-16 MED ORDER — LIDOCAINE HCL (PF) 2 % IJ SOLN
INTRAMUSCULAR | Status: AC
  Filled 2019-09-16: qty 5

## 2019-09-16 MED ORDER — PROPOFOL 10 MG/ML IV BOLUS
INTRAVENOUS | Status: AC
  Filled 2019-09-16: qty 20

## 2019-09-16 MED ORDER — FENTANYL CITRATE (PF) 100 MCG/2ML IJ SOLN
INTRAMUSCULAR | Status: DC | PRN
  Administered 2019-09-16: 100 ug via INTRAVENOUS

## 2019-09-16 MED ORDER — OXYBUTYNIN CHLORIDE 5 MG PO TABS: ORAL_TABLET | ORAL | 0 refills | Status: DC

## 2019-09-16 MED ORDER — PROPOFOL 10 MG/ML IV BOLUS
INTRAVENOUS | Status: DC | PRN
  Administered 2019-09-16: 160 mg via INTRAVENOUS

## 2019-09-16 MED ORDER — FAMOTIDINE 20 MG PO TABS
ORAL_TABLET | ORAL | Status: AC
  Filled 2019-09-16: qty 1

## 2019-09-16 MED ORDER — SUGAMMADEX SODIUM 200 MG/2ML IV SOLN
INTRAVENOUS | Status: AC
  Filled 2019-09-16: qty 2

## 2019-09-16 MED ORDER — FAMOTIDINE 20 MG PO TABS
20.0000 mg | ORAL_TABLET | Freq: Once | ORAL | Status: AC
  Administered 2019-09-16: 20 mg via ORAL

## 2019-09-16 MED ORDER — EPHEDRINE SULFATE 50 MG/ML IJ SOLN
INTRAMUSCULAR | Status: AC
  Filled 2019-09-16: qty 1

## 2019-09-16 MED ORDER — PHENYLEPHRINE HCL (PRESSORS) 10 MG/ML IV SOLN
INTRAVENOUS | Status: DC | PRN
  Administered 2019-09-16 (×3): 100 ug via INTRAVENOUS

## 2019-09-16 MED ORDER — EPHEDRINE SULFATE 50 MG/ML IJ SOLN
INTRAMUSCULAR | Status: DC | PRN
  Administered 2019-09-16: 5 mg via INTRAVENOUS

## 2019-09-16 SURGICAL SUPPLY — 27 items
BAG DRAIN CYSTO-URO LG1000N (MISCELLANEOUS) ×4 IMPLANT
BASKET ZERO TIP 1.9FR (BASKET) IMPLANT
BRUSH SCRUB EZ 1% IODOPHOR (MISCELLANEOUS) ×4 IMPLANT
CATH URETL 5X70 OPEN END (CATHETERS) IMPLANT
CNTNR SPEC 2.5X3XGRAD LEK (MISCELLANEOUS)
CONT SPEC 4OZ STER OR WHT (MISCELLANEOUS)
CONTAINER SPEC 2.5X3XGRAD LEK (MISCELLANEOUS) IMPLANT
DRAPE UTILITY 15X26 TOWEL STRL (DRAPES) ×4 IMPLANT
GLOVE BIO SURGEON STRL SZ8 (GLOVE) ×4 IMPLANT
GOWN STRL REUS W/ TWL LRG LVL3 (GOWN DISPOSABLE) ×2 IMPLANT
GOWN STRL REUS W/ TWL XL LVL3 (GOWN DISPOSABLE) ×2 IMPLANT
GOWN STRL REUS W/TWL LRG LVL3 (GOWN DISPOSABLE) ×2
GOWN STRL REUS W/TWL XL LVL3 (GOWN DISPOSABLE) ×2
GUIDEWIRE STR DUAL SENSOR (WIRE) ×7 IMPLANT
INFUSOR MANOMETER BAG 3000ML (MISCELLANEOUS) ×4 IMPLANT
INTRODUCER DILATOR DOUBLE (INTRODUCER) ×3 IMPLANT
KIT TURNOVER CYSTO (KITS) ×4 IMPLANT
PACK CYSTO AR (MISCELLANEOUS) ×4 IMPLANT
SET CYSTO W/LG BORE CLAMP LF (SET/KITS/TRAYS/PACK) ×4 IMPLANT
SHEATH URETERAL 12FRX35CM (MISCELLANEOUS) ×3 IMPLANT
SOL .9 NS 3000ML IRR  AL (IV SOLUTION) ×2
SOL .9 NS 3000ML IRR UROMATIC (IV SOLUTION) ×2 IMPLANT
STENT URET 6FRX24 CONTOUR (STENTS) IMPLANT
STENT URET 6FRX26 CONTOUR (STENTS) ×3 IMPLANT
SURGILUBE 2OZ TUBE FLIPTOP (MISCELLANEOUS) ×4 IMPLANT
VALVE UROSEAL ADJ ENDO (VALVE) ×3 IMPLANT
WATER STERILE IRR 1000ML POUR (IV SOLUTION) ×4 IMPLANT

## 2019-09-16 NOTE — Interval H&P Note (Signed)
History and Physical Interval Note:  09/16/2019 8:41 AM  [name]  has presented today for surgery, with the diagnosis of left nephrolithiasis.  The various methods of treatment have been discussed with the patient and family. After consideration of risks, benefits and other options for treatment, the patient has consented to  Procedure(s): CYSTOSCOPY/URETEROSCOPY/HOLMIUM LASER/STENT PLACEMENT (Left) as a surgical intervention.  The patient's history has been reviewed, patient examined, no change in status, stable for surgery.  I have reviewed the patient's chart and labs.  Questions were answered to the patient's satisfaction.     [place]

## 2019-09-16 NOTE — Anesthesia Preprocedure Evaluation (Signed)
Anesthesia Evaluation  Patient identified by MRN, date of birth, ID band Patient awake    Reviewed: Allergy & Precautions, H&P , NPO status , Patient's Chart, lab work & pertinent test results, reviewed documented beta blocker date and time   History of Anesthesia Complications (+) DIFFICULT AIRWAY and history of anesthetic complications  Airway Mallampati: IV  TM Distance: >3 FB Neck ROM: full  Mouth opening: Limited Mouth Opening  Dental  (+) Caps, Dental Advidsory Given, Missing, Teeth Intact   Pulmonary neg pulmonary ROS,    Pulmonary exam normal        Cardiovascular Exercise Tolerance: Good hypertension, (-) angina+ CAD, + Past MI and + CABG  (-) Cardiac Stents Normal cardiovascular exam+ dysrhythmias Atrial Fibrillation (-) Valvular Problems/Murmurs     Neuro/Psych negative neurological ROS  negative psych ROS   GI/Hepatic negative GI ROS, Neg liver ROS,   Endo/Other  negative endocrine ROS  Renal/GU Renal disease  negative genitourinary   Musculoskeletal   Abdominal   Peds  Hematology negative hematology ROS (+)   Anesthesia Other Findings Past Medical History: No date: CAD (coronary artery disease)     Comment:  a. CT scan 01/02/2015: extensive coronary               atherosclerosis;  b. 04/2015 Inf STEMI/Cath/CABG x 4:               LIMA->LAD, VG->Diag, VG->OM2, VG->RPL; c. 09/2015 MV: EF               55-65%, infsept/inf infarct w/o ischemia->Low Risk. No date: Colon polyps No date: Complete heart block ([facility])     Comment:  a. 04/2015 in setting of inferior STEMI-->resolved. No date: Difficult intubation No date: Hyperlipidemia No date: Hypertension No date: Ischemic cardiomyopathy     Comment:  a. 04/2015 LV gram: EF 35-40%; b. 09/2015 MV: EF 55-65%. No date: Kidney stones     Comment:  a. May 2016. 04/2015: MI (myocardial infarction) ([facility]) No date: Postoperative atrial fibrillation ([facility])     Comment:   a. 04/2015 Post-op CABG AF/AFlutter--> converted with               rapid atrial pacing and amio; b. CHA2DS2VASc =               3->Coumadin. 04/2015: S/P CABG x 4   Reproductive/Obstetrics negative OB ROS                             Anesthesia Physical Anesthesia Plan  ASA: III  Anesthesia Plan: General   Post-op Pain Management:    Induction: Intravenous  PONV Risk Score and Plan: 2 and Ondansetron, Dexamethasone and Treatment may vary due to age or medical condition  Airway Management Planned: LMA and Oral ETT  Additional Equipment:   Intra-op Plan:   Post-operative Plan: Extubation in OR  Informed Consent: I have reviewed the patients History and Physical, chart, labs and discussed the procedure including the risks, benefits and alternatives for the proposed anesthesia with the patient or authorized representative who has indicated his/her understanding and acceptance.     Dental Advisory Given  Plan Discussed with: Anesthesiologist, CRNA and Surgeon  Anesthesia Plan Comments:         Anesthesia Quick Evaluation

## 2019-09-16 NOTE — Discharge Instructions (Addendum)
AMBULATORY SURGERY  DISCHARGE INSTRUCTIONS   1) The drugs that you were given will stay in your system until tomorrow so for the next 24 hours you should not:  A) Drive an automobile B) Make any legal decisions C) Drink any alcoholic beverage   2) You may resume regular meals tomorrow.  Today it is better to start with liquids and gradually work up to solid foods.  You may eat anything you prefer, but it is better to start with liquids, then soup and crackers, and gradually work up to solid foods.   3) Please notify your doctor immediately if you have any unusual bleeding, trouble breathing, redness and pain at the surgery site, drainage, fever, or pain not relieved by medication.  4) Your post-operative visit with Dr.                                     is: Date:                        Time:    Please call to schedule your post-operative visit.  5) Additional Instructions: DISCHARGE INSTRUCTIONS FOR KIDNEY STONE/URETERAL STENT   MEDICATIONS:  1. Resume all your other meds from home.  2.  AZO (over-the-counter) can help with the burning/stinging when you urinate. 3.  Tamsulosin and oxybutynin are for bladder/stent irritation, Rx was sent to your pharmacy.  ACTIVITY:  1. May resume regular activities in 24 hours. 2. No driving while on narcotic pain medications  3. Drink plenty of water  4. Continue to walk at home - you can still get blood clots when you are at home, so keep active, but don't over do it.  5. May return to work/school tomorrow or when you feel ready   BATHING:  1. You can shower.  SIGNS/SYMPTOMS TO CALL:  Please call us if you have a fever greater than 101.5, uncontrolled nausea/vomiting, uncontrolled pain, dizziness, unable to urinate, excessively bloody urine, chest pain, shortness of breath, leg swelling, leg pain, or any other concerns or questions.   Common postoperative symptoms are urinary frequency, urgency, burning and bladder spasms.  Blood in  the urine is normal.  You can reach [place] at [phone].   FOLLOW-UP:  1. You we will be contacted regarding follow-up treatment of your stone

## 2019-09-16 NOTE — Anesthesia Procedure Notes (Signed)
Procedure Name: Intubation Date/Time: 09/16/2019 9:17 AM Performed by: Allean Found, CRNA Pre-anesthesia Checklist: Patient identified, Patient being monitored, Timeout performed, Emergency Drugs available and Suction available Patient Re-evaluated:Patient Re-evaluated prior to induction Oxygen Delivery Method: Circle system utilized Preoxygenation: Pre-oxygenation with 100% oxygen Induction Type: IV induction Ventilation: Mask ventilation without difficulty Laryngoscope Size: McGraph and 4 Grade View: Grade I Tube type: Oral Tube size: 7.5 mm Number of attempts: 1 Airway Equipment and Method: Stylet Placement Confirmation: ETT inserted through vocal cords under direct vision,  positive ETCO2 and breath sounds checked- equal and bilateral Secured at: 22 cm Tube secured with: Tape Dental Injury: Teeth and Oropharynx as per pre-operative assessment  Difficulty Due To: Difficulty was anticipated and Difficult Airway- due to anterior larynx

## 2019-09-16 NOTE — Op Note (Signed)
Preoperative diagnosis:  1. Left nephrolithiasis 2. Renal colic  Postoperative diagnosis:  1. Left nephrolithiasis  Procedure:  1. Cystoscopy 2. Left ureteroscopy 3. Left ureteral stent placement (6FR/26 cm) 4. Left retrograde pyelography with interpretation   Surgeon: Nicki Reaper C. Danyale Ridinger, M.D.  Anesthesia: General  Complications: None  Intraoperative findings:  1.  Narrow mid ureter-unable to advance ureteroscope proximal to this point  2.  Left retrograde pyelogram-no hydronephrosis or extravasation  EBL: Minimal  Specimens: None  Indication: [name] is a 69 y.o. with a long history of a 7 mm left upper pole renal calculus.  He was seen yesterday with a 2-day history of renal colic, nausea and vomiting and KUB remarkable for migration of the stone into the UPJ. After reviewing the management options for treatment, he elected to proceed with the above surgical procedure(s). We have discussed the potential benefits and risks of the procedure, side effects of the proposed treatment, the likelihood of the patient achieving the goals of the procedure, and any potential problems that might occur during the procedure or recuperation. Informed consent has been obtained.  Description of procedure:  The patient was taken to the operating room and general anesthesia was induced.  The patient was placed in the dorsal lithotomy position, prepped and draped in the usual sterile fashion, and preoperative antibiotics were administered. A preoperative time-out was performed.   A 21 French cystoscope was lubricated and passed under direct vision.  The urethra normal in caliber without stricture.  The prostatic urethra was remarkable for mild to moderate lateral lobe enlargement and moderate bladder neck elevation.  The bladder was then systematically examined in its entirety. There was no evidence for any bladder tumors, stones, or other mucosal pathology.    A 0.038 sensor guidewire was  then advanced up the left ureter into the renal pelvis under fluoroscopic guidance.  On fluoroscopy the calculus was no longer in the vicinity of the UPJ but on the lateral portion of the renal pelvis.  The cystoscope was removed and a dual-lumen ureteral catheter was placed over the guidewire and a second sensor wire was placed.  The mid ureter was noted to be tight on placement of the dual-lumen catheter.  The inner stylette of a ureteral access sheath was then placed over the wire and would not advance beyond the distal ureter. A 12/14 French access sheath would also not advance proximally.  A single channel digital ureteroscope was then placed over the working wire.  The distal ureter was easily engaged and advanced towards the mid ureter however could not be advanced through this portion of the ureter.  The ureteroscope was removed and a dual-lumen catheter was replaced after removal of the working wire.  Retrograde pyelogram was performed which showed no ureteral stricture or contrast extravasation.  The calculus was in the lateral portion of the renal pelvis.  It was elected at this point to place a ureteral stent.  A 6 French/26 cm double-J Contour ureteral stent was then placed over the guidewire under fluoroscopic guidance.  There was good curl seen in the renal pelvis and bladder on fluoroscopy.  The cystoscope sheath was placed in the bladder which was emptied.  All instruments were removed.  He was transported to the PACU in stable condition.  Plan: Will discuss with patient options of shockwave lithotripsy of his calculus versus repeat ureteroscopy in 2-3 weeks after period of stent dilation.   John Giovanni, MD

## 2019-09-16 NOTE — Transfer of Care (Cosign Needed)
Immediate Anesthesia Transfer of Care Note  Patient: [name]  Procedure(s) Performed: CYSTOSCOPY/RETROGRADE/URETEROSCOPY (Left Ureter) CYSTOSCOPY WITH STENT PLACEMENT (Left Ureter)  Patient Location: PACU  Anesthesia Type:General  Level of Consciousness: awake, alert , oriented and patient cooperative  Airway & Oxygen Therapy: Patient Spontanous Breathing  Post-op Assessment: Report given to RN  Post vital signs: stable  Last Vitals:  Vitals Value Taken Time  BP 155/78 09/16/19 1006  Temp    Pulse 80 09/16/19 1007  Resp 19 09/16/19 1007  SpO2 100 % 09/16/19 1007  Vitals shown include unvalidated device data.  Last Pain:  Vitals:   09/16/19 0807  TempSrc: Tympanic  PainSc: 0-No pain         Complications: No apparent anesthesia complications

## 2019-09-17 NOTE — Anesthesia Postprocedure Evaluation (Signed)
Anesthesia Post Note  Patient: [name]  Procedure(s) Performed: CYSTOSCOPY/RETROGRADE/URETEROSCOPY (Left Ureter) CYSTOSCOPY WITH STENT PLACEMENT (Left Ureter)  Patient location during evaluation: PACU Anesthesia Type: General Level of consciousness: awake and alert Pain management: pain level controlled Vital Signs Assessment: post-procedure vital signs reviewed and stable Respiratory status: spontaneous breathing, nonlabored ventilation, respiratory function stable and patient connected to nasal cannula oxygen Cardiovascular status: blood pressure returned to baseline and stable Postop Assessment: no apparent nausea or vomiting Anesthetic complications: no     Last Vitals:  Vitals:   09/16/19 1047 09/16/19 1122  BP: (!) 153/74 (!) 150/65  Pulse: 70 75  Resp: 18 18  Temp: 36.6 C   SpO2: 96% 94%    Last Pain:  Vitals:   09/16/19 1122  TempSrc:   PainSc: 0-No pain                 Martha Clan

## 2019-09-18 ENCOUNTER — Telehealth: Payer: Self-pay | Admitting: Radiology

## 2019-09-18 NOTE — Telephone Encounter (Signed)
Patient called to set up follow up surgery. Please advise.

## 2019-09-21 NOTE — Telephone Encounter (Signed)
He had a tight ureter and was unable to access his stone.  Stent was placed.  He needs to be either scheduled for shockwave lithotripsy this week for repeat ureteroscopy next week or the week after that.  If he has any questions let me know.  Can cancel his stent removal appointment on Tuesday

## 2019-09-22 ENCOUNTER — Other Ambulatory Visit: Payer: Self-pay | Admitting: Radiology

## 2019-09-22 DIAGNOSIS — N200 Calculus of kidney: Secondary | ICD-10-CM

## 2019-09-22 NOTE — Telephone Encounter (Signed)
Patient would like to proceed with ureteroscopy. Instructions given. Patient verbalized understanding.

## 2019-09-23 ENCOUNTER — Other Ambulatory Visit: Payer: PPO | Admitting: Urology

## 2019-09-26 ENCOUNTER — Other Ambulatory Visit: Payer: Self-pay | Admitting: *Deleted

## 2019-09-26 DIAGNOSIS — N200 Calculus of kidney: Secondary | ICD-10-CM

## 2019-09-29 ENCOUNTER — Other Ambulatory Visit: Payer: Self-pay

## 2019-09-29 ENCOUNTER — Other Ambulatory Visit: Payer: PPO

## 2019-09-29 DIAGNOSIS — N200 Calculus of kidney: Secondary | ICD-10-CM

## 2019-10-02 ENCOUNTER — Other Ambulatory Visit: Payer: PPO

## 2019-10-03 ENCOUNTER — Other Ambulatory Visit: Payer: Self-pay | Admitting: Radiology

## 2019-10-03 ENCOUNTER — Telehealth: Payer: Self-pay | Admitting: Radiology

## 2019-10-03 ENCOUNTER — Other Ambulatory Visit: Payer: Self-pay | Admitting: Urology

## 2019-10-03 ENCOUNTER — Other Ambulatory Visit
Admission: RE | Admit: 2019-10-03 | Discharge: 2019-10-03 | Disposition: A | Discharge status: Home / Self Care | Payer: PPO | Source: Ambulatory Visit | Attending: Urology | Admitting: Urology

## 2019-10-03 DIAGNOSIS — Z20822 Contact with and (suspected) exposure to covid-19: Secondary | ICD-10-CM | POA: Insufficient documentation

## 2019-10-03 DIAGNOSIS — N200 Calculus of kidney: Secondary | ICD-10-CM

## 2019-10-03 DIAGNOSIS — Z01812 Encounter for preprocedural laboratory examination: Secondary | ICD-10-CM | POA: Diagnosis not present

## 2019-10-03 LAB — CULTURE, URINE COMPREHENSIVE

## 2019-10-03 LAB — SARS CORONAVIRUS 2 (TAT 6-24 HRS): SARS Coronavirus 2: NEGATIVE

## 2019-10-03 MED ORDER — AMOXICILLIN 875 MG PO TABS: 875.0000 mg | ORAL_TABLET | Freq: Two times a day (BID) | ORAL | 0 refills | Status: DC

## 2019-10-03 NOTE — Telephone Encounter (Signed)
-----   Message from Abbie Sons, MD sent at 10/03/2019  3:26 PM EST ----- Preop urine culture growing a low level of bacteria.  I sent in antibiotic Rx to pharmacy.  He should be fine for the procedure on Tuesday if he starts the antibiotic tomorrow.  Please change his preop antibiotic to Levaquin 500 mg

## 2019-10-03 NOTE — Telephone Encounter (Signed)
Notified patient of script sent to pharmacy. preop antibiotic changed to Levaquin 500mg  IV. Patient states he has not yet stopped taking aspirin. Per Dr Bernardo Heater advised patient to discontinue immediately and surgery can proceed. Questions answered. Patient expresses understanding.

## 2019-10-06 MED ORDER — LEVOFLOXACIN IN D5W 500 MG/100ML IV SOLN
500.0000 mg | INTRAVENOUS | Status: AC
  Administered 2019-10-07: 500 mg via INTRAVENOUS

## 2019-10-07 ENCOUNTER — Ambulatory Visit
Admission: RE | Admit: 2019-10-07 | Discharge: 2019-10-07 | Disposition: A | Discharge status: Home / Self Care | Payer: PPO | Attending: Urology | Admitting: Urology

## 2019-10-07 ENCOUNTER — Other Ambulatory Visit: Payer: Self-pay

## 2019-10-07 ENCOUNTER — Encounter
Admission: RE | Disposition: A | Discharge status: Home / Self Care | Payer: Self-pay | Source: Home / Self Care | Attending: Urology

## 2019-10-07 ENCOUNTER — Encounter: Payer: Self-pay | Admitting: Urology

## 2019-10-07 ENCOUNTER — Ambulatory Visit: Payer: PPO

## 2019-10-07 ENCOUNTER — Ambulatory Visit: Payer: PPO | Admitting: Anesthesiology

## 2019-10-07 ENCOUNTER — Telehealth: Payer: Self-pay | Admitting: Urology

## 2019-10-07 DIAGNOSIS — Z79899 Other long term (current) drug therapy: Secondary | ICD-10-CM | POA: Insufficient documentation

## 2019-10-07 DIAGNOSIS — I2510 Atherosclerotic heart disease of native coronary artery without angina pectoris: Secondary | ICD-10-CM | POA: Insufficient documentation

## 2019-10-07 DIAGNOSIS — Z951 Presence of aortocoronary bypass graft: Secondary | ICD-10-CM | POA: Insufficient documentation

## 2019-10-07 DIAGNOSIS — Z7982 Long term (current) use of aspirin: Secondary | ICD-10-CM | POA: Diagnosis not present

## 2019-10-07 DIAGNOSIS — I10 Essential (primary) hypertension: Secondary | ICD-10-CM | POA: Diagnosis not present

## 2019-10-07 DIAGNOSIS — N200 Calculus of kidney: Secondary | ICD-10-CM | POA: Insufficient documentation

## 2019-10-07 DIAGNOSIS — I252 Old myocardial infarction: Secondary | ICD-10-CM | POA: Diagnosis not present

## 2019-10-07 DIAGNOSIS — I255 Ischemic cardiomyopathy: Secondary | ICD-10-CM | POA: Insufficient documentation

## 2019-10-07 DIAGNOSIS — Z96641 Presence of right artificial hip joint: Secondary | ICD-10-CM | POA: Insufficient documentation

## 2019-10-07 DIAGNOSIS — Z466 Encounter for fitting and adjustment of urinary device: Secondary | ICD-10-CM | POA: Diagnosis not present

## 2019-10-07 DIAGNOSIS — E785 Hyperlipidemia, unspecified: Secondary | ICD-10-CM | POA: Insufficient documentation

## 2019-10-07 HISTORY — PX: CYSTOSCOPY/URETEROSCOPY/HOLMIUM LASER/STENT PLACEMENT: SHX6546

## 2019-10-07 SURGERY — CYSTOSCOPY/URETEROSCOPY/HOLMIUM LASER/STENT PLACEMENT
Anesthesia: General | Laterality: Left

## 2019-10-07 MED ORDER — ROCURONIUM BROMIDE 50 MG/5ML IV SOLN
INTRAVENOUS | Status: AC
  Filled 2019-10-07: qty 1

## 2019-10-07 MED ORDER — FENTANYL CITRATE (PF) 100 MCG/2ML IJ SOLN
INTRAMUSCULAR | Status: AC
  Filled 2019-10-07: qty 2

## 2019-10-07 MED ORDER — SUGAMMADEX SODIUM 200 MG/2ML IV SOLN
INTRAVENOUS | Status: AC
  Filled 2019-10-07: qty 2

## 2019-10-07 MED ORDER — ROCURONIUM BROMIDE 100 MG/10ML IV SOLN
INTRAVENOUS | Status: DC | PRN
  Administered 2019-10-07: 50 mg via INTRAVENOUS

## 2019-10-07 MED ORDER — MIDAZOLAM HCL 2 MG/2ML IJ SOLN
INTRAMUSCULAR | Status: DC | PRN
  Administered 2019-10-07: 1 mg via INTRAVENOUS

## 2019-10-07 MED ORDER — PHENYLEPHRINE HCL (PRESSORS) 10 MG/ML IV SOLN
INTRAVENOUS | Status: DC | PRN
  Administered 2019-10-07: 100 ug via INTRAVENOUS

## 2019-10-07 MED ORDER — OXYCODONE HCL 5 MG/5ML PO SOLN: 5.0000 mg | Freq: Once | ORAL | Status: DC | PRN

## 2019-10-07 MED ORDER — IOHEXOL 180 MG/ML  SOLN
INTRAMUSCULAR | Status: DC | PRN
  Administered 2019-10-07: 10 mL

## 2019-10-07 MED ORDER — FAMOTIDINE 20 MG PO TABS: 20.0000 mg | ORAL_TABLET | Freq: Once | ORAL | Status: AC

## 2019-10-07 MED ORDER — ONDANSETRON HCL 4 MG/2ML IJ SOLN
INTRAMUSCULAR | Status: AC
  Filled 2019-10-07: qty 2

## 2019-10-07 MED ORDER — FENTANYL CITRATE (PF) 100 MCG/2ML IJ SOLN: 25.0000 ug | INTRAMUSCULAR | Status: DC | PRN

## 2019-10-07 MED ORDER — FENTANYL CITRATE (PF) 100 MCG/2ML IJ SOLN
INTRAMUSCULAR | Status: DC | PRN
  Administered 2019-10-07: 50 ug via INTRAVENOUS

## 2019-10-07 MED ORDER — PROPOFOL 10 MG/ML IV BOLUS
INTRAVENOUS | Status: DC | PRN
  Administered 2019-10-07: 120 mg via INTRAVENOUS

## 2019-10-07 MED ORDER — PROMETHAZINE HCL 25 MG/ML IJ SOLN: 6.2500 mg | INTRAMUSCULAR | Status: DC | PRN

## 2019-10-07 MED ORDER — MEPERIDINE HCL 50 MG/ML IJ SOLN: 6.2500 mg | INTRAMUSCULAR | Status: DC | PRN

## 2019-10-07 MED ORDER — GLYCOPYRROLATE 0.2 MG/ML IJ SOLN
INTRAMUSCULAR | Status: AC
  Filled 2019-10-07: qty 1

## 2019-10-07 MED ORDER — ONDANSETRON HCL 4 MG/2ML IJ SOLN
INTRAMUSCULAR | Status: DC | PRN
  Administered 2019-10-07: 4 mg via INTRAVENOUS

## 2019-10-07 MED ORDER — DEXAMETHASONE SODIUM PHOSPHATE 10 MG/ML IJ SOLN
INTRAMUSCULAR | Status: AC
  Filled 2019-10-07: qty 1

## 2019-10-07 MED ORDER — DEXAMETHASONE SODIUM PHOSPHATE 10 MG/ML IJ SOLN
INTRAMUSCULAR | Status: DC | PRN
  Administered 2019-10-07: 10 mg via INTRAVENOUS

## 2019-10-07 MED ORDER — PROPOFOL 10 MG/ML IV BOLUS
INTRAVENOUS | Status: AC
  Filled 2019-10-07: qty 20

## 2019-10-07 MED ORDER — MIDAZOLAM HCL 2 MG/2ML IJ SOLN
INTRAMUSCULAR | Status: AC
  Filled 2019-10-07: qty 2

## 2019-10-07 MED ORDER — LEVOFLOXACIN IN D5W 500 MG/100ML IV SOLN
INTRAVENOUS | Status: AC
  Filled 2019-10-07: qty 100

## 2019-10-07 MED ORDER — SUGAMMADEX SODIUM 200 MG/2ML IV SOLN
INTRAVENOUS | Status: DC | PRN
  Administered 2019-10-07: 200 mg via INTRAVENOUS

## 2019-10-07 MED ORDER — LIDOCAINE HCL (PF) 2 % IJ SOLN
INTRAMUSCULAR | Status: AC
  Filled 2019-10-07: qty 5

## 2019-10-07 MED ORDER — LIDOCAINE HCL (CARDIAC) PF 100 MG/5ML IV SOSY
PREFILLED_SYRINGE | INTRAVENOUS | Status: DC | PRN
  Administered 2019-10-07: 100 mg via INTRAVENOUS

## 2019-10-07 MED ORDER — OXYCODONE HCL 5 MG PO TABS: 5.0000 mg | ORAL_TABLET | Freq: Once | ORAL | Status: DC | PRN

## 2019-10-07 MED ORDER — FAMOTIDINE 20 MG PO TABS
ORAL_TABLET | ORAL | Status: AC
  Administered 2019-10-07: 20 mg via ORAL
  Filled 2019-10-07: qty 1

## 2019-10-07 MED ORDER — LACTATED RINGERS IV SOLN: INTRAVENOUS | Status: DC

## 2019-10-07 SURGICAL SUPPLY — 30 items
BAG DRAIN CYSTO-URO LG1000N (MISCELLANEOUS) ×3 IMPLANT
BASKET LASER NITINOL 1.9FR (BASKET) ×2 IMPLANT
BASKET ZERO TIP 1.9FR (BASKET) ×2 IMPLANT
BRUSH SCRUB EZ 1% IODOPHOR (MISCELLANEOUS) ×3 IMPLANT
CATH URETL 5X70 OPEN END (CATHETERS) IMPLANT
CNTNR SPEC 2.5X3XGRAD LEK (MISCELLANEOUS) ×1
CONT SPEC 4OZ STER OR WHT (MISCELLANEOUS) ×2
CONT SPEC 4OZ STRL OR WHT (MISCELLANEOUS) ×1
CONTAINER SPEC 2.5X3XGRAD LEK (MISCELLANEOUS) IMPLANT
DRAPE UTILITY 15X26 TOWEL STRL (DRAPES) ×3 IMPLANT
FIBER LASER TRACTIP 200 (UROLOGICAL SUPPLIES) ×3 IMPLANT
GLOVE BIO SURGEON STRL SZ8 (GLOVE) ×3 IMPLANT
GOWN STRL REUS W/ TWL LRG LVL3 (GOWN DISPOSABLE) ×1 IMPLANT
GOWN STRL REUS W/ TWL XL LVL3 (GOWN DISPOSABLE) ×1 IMPLANT
GOWN STRL REUS W/TWL LRG LVL3 (GOWN DISPOSABLE) ×3
GOWN STRL REUS W/TWL XL LVL3 (GOWN DISPOSABLE) ×3
GUIDEWIRE STR DUAL SENSOR (WIRE) ×5 IMPLANT
INFUSOR MANOMETER BAG 3000ML (MISCELLANEOUS) ×3 IMPLANT
INTRODUCER DILATOR DOUBLE (INTRODUCER) ×2 IMPLANT
KIT TURNOVER CYSTO (KITS) ×3 IMPLANT
PACK CYSTO AR (MISCELLANEOUS) ×3 IMPLANT
SET CYSTO W/LG BORE CLAMP LF (SET/KITS/TRAYS/PACK) ×3 IMPLANT
SHEATH URETERAL 12FRX35CM (MISCELLANEOUS) IMPLANT
SOL .9 NS 3000ML IRR  AL (IV SOLUTION) ×3
SOL .9 NS 3000ML IRR UROMATIC (IV SOLUTION) ×1 IMPLANT
STENT URET 6FRX24 CONTOUR (STENTS) ×2 IMPLANT
STENT URET 6FRX26 CONTOUR (STENTS) IMPLANT
SURGILUBE 2OZ TUBE FLIPTOP (MISCELLANEOUS) ×3 IMPLANT
VALVE UROSEAL ADJ ENDO (VALVE) ×2 IMPLANT
WATER STERILE IRR 1000ML POUR (IV SOLUTION) ×3 IMPLANT

## 2019-10-07 NOTE — Anesthesia Preprocedure Evaluation (Signed)
Anesthesia Evaluation  Patient identified by MRN, date of birth, ID band Patient awake    Reviewed: Allergy & Precautions, NPO status , Patient's Chart, lab work & pertinent test results  History of Anesthesia Complications (+) DIFFICULT AIRWAY and history of anesthetic complications  Airway Mallampati: III  TM Distance: >3 FB Neck ROM: Full    Dental no notable dental hx.    Pulmonary neg pulmonary ROS, neg sleep apnea, neg COPD,    breath sounds clear to auscultation- rhonchi (-) wheezing      Cardiovascular hypertension, Pt. on medications + CAD, + Past MI and + CABG (2016)  + dysrhythmias Atrial Fibrillation  Rhythm:Regular Rate:Normal - Systolic murmurs and - Diastolic murmurs    Neuro/Psych neg Seizures negative neurological ROS  negative psych ROS   GI/Hepatic negative GI ROS, Neg liver ROS,   Endo/Other  negative endocrine ROSneg diabetes  Renal/GU Renal disease: nephrolithiasis.     Musculoskeletal  (+) Arthritis ,   Abdominal (+) + obese,   Peds  Hematology negative hematology ROS (+)   Anesthesia Other Findings Past Medical History: No date: CAD (coronary artery disease)     Comment:  a. CT scan 01/02/2015: extensive coronary               atherosclerosis;  b. 04/2015 Inf STEMI/Cath/CABG x 4:               LIMA->LAD, VG->Diag, VG->OM2, VG->RPL; c. 09/2015 MV: EF               55-65%, infsept/inf infarct w/o ischemia->Low Risk. No date: Colon polyps No date: Complete heart block ([place])     Comment:  a. 04/2015 in setting of inferior STEMI-->resolved. No date: Difficult intubation No date: Hyperlipidemia No date: Hypertension No date: Ischemic cardiomyopathy     Comment:  a. 04/2015 LV gram: EF 35-40%; b. 09/2015 MV: EF 55-65%. No date: Kidney stones     Comment:  a. May 2016. 04/2015: MI (myocardial infarction) ([place]) No date: Postoperative atrial fibrillation ([place])     Comment:  a. 04/2015 Post-op CABG  AF/AFlutter--> converted with               rapid atrial pacing and amio; b. CHA2DS2VASc =               3->Coumadin. 04/2015: S/P CABG x 4   Reproductive/Obstetrics                             Anesthesia Physical Anesthesia Plan  ASA: III  Anesthesia Plan: General   Post-op Pain Management:    Induction: Intravenous  PONV Risk Score and Plan: 1 and Ondansetron and Dexamethasone  Airway Management Planned: Oral ETT  Additional Equipment:   Intra-op Plan:   Post-operative Plan: Extubation in OR  Informed Consent: I have reviewed the patients History and Physical, chart, labs and discussed the procedure including the risks, benefits and alternatives for the proposed anesthesia with the patient or authorized representative who has indicated his/her understanding and acceptance.     Dental advisory given  Plan Discussed with: CRNA and Anesthesiologist  Anesthesia Plan Comments:         Anesthesia Quick Evaluation

## 2019-10-07 NOTE — Telephone Encounter (Signed)
-----   Message from Abbie Sons, MD sent at 10/07/2019 10:21 AM EST ----- Regarding: post op Please schedule postop follow-up with KUB approximately 1 month

## 2019-10-07 NOTE — Op Note (Signed)
Preoperative diagnosis: Left nephrolithiasis   Postoperative diagnosis: Left nephrolithiasis  Procedure:  1. Cystoscopy 2. Left ureteroscopy and stone removal 3. Ureteroscopic laser lithotripsy 4. Left ureteral stent exchange (6FR 24 cm)  5. Left retrograde pyelography with interpretation  Surgeon: Nicki Reaper C. Luis Sami, M.D.  Anesthesia: General  Complications: None  Intraoperative findings:  1.  Left retrograde pyelography post procedure showed no filling defects, stone fragments or contrast extravasation  EBL: Minimal  Specimens: 1. Calculus fragments for analysis   Indication: [name] is a 69 y.o. male previously followed for an asymptomatic 7 mm left upper pole renal calculus.  In early February 2021 he developed left renal colic and KUB showed migration of the upper pole stone to the UPJ.  After discussing treatment options he elected ureteroscopic removal which was performed on 09/16/2019 however due to tightness of the left mid ureter a flexible ureteroscope was unable to access the renal pelvis and he was stented to allow for a period of passive ureteral dilation.  He presents today for follow-up ureteroscopy.  After reviewing the management options for treatment, the patient elected to proceed with the above surgical procedure(s). We have discussed the potential benefits and risks of the procedure, side effects of the proposed treatment, the likelihood of the patient achieving the goals of the procedure, and any potential problems that might occur during the procedure or recuperation. Informed consent has been obtained.  Description of procedure:  The patient was taken to the operating room and general anesthesia was induced.  The patient was placed in the dorsal lithotomy position, prepped and draped in the usual sterile fashion, and preoperative antibiotics were administered. A preoperative time-out was performed.   A 21 French cystoscope was lubricated and passed under  direct vision.  The urethra normal in caliber without stricture.  The prostatic urethra was remarkable for mild to moderate lateral lobe enlargement and moderate bladder neck elevation.  The bladder was then systematically examined in its entirety. There was no evidence for any bladder tumors, stones, or other mucosal pathology.  The stent was grasped with endoscopic forceps and brought out to the urethral meatus.  A 0.038 Sensor wire was then placed through the stent and advanced into the renal pelvis under fluoroscopic guidance.  The stent was removed the cystoscope was removed and a dual-lumen catheter was placed over the sensor wire and a second sensor wire was placed in a similar fashion.  A digital flexible ureteroscope was placed over the working wire and once the distal ureter was engaged the guidewire was removed and the ureteroscope was advanced into the renal pelvis without difficulty.  On fluoroscopy the stone was noted to be within the lower pole calyx.  It was identified and placed in an Escape basket and relocated to an upper pole calyx.    A 200 micron holmium laser fiber was placed through the ureteroscope and the stone was dusted at a setting of 0.2 J and frequency of 20 hz.  Settings were then changed to 0.2/50 and fragments >2 mm in size were further fragmented on the settings.  Approximately 4 remaining fragments were placed and the escape basket and dropped into the bladder.  The ureter was easily engaged with the flexible ureteroscope and advanced into the renal pelvis without difficulty.  Retrograde pyelogram was performed and each calyx was sequentially examined under fluoroscopic guidance and no significant size fragments were identified.  As the ureteroscope was withdrawn no fragments were seen within the ureter.  A  6 FR/24 CM stent was was placed under fluoroscopic guidance.  The wire was then removed with an adequate stent curl noted in the renal pelvis as well as in the  bladder.  The cystoscope sheath was placed in the bladder which was emptied and several small fragments were collected and sent for analysis.  After anesthetic reversal he was transported to the PACU in stable condition.  Plan: The ureteral stent was left attached to a tether which was secured to the dorsum of the penis with Tegaderm.  He was instructed to remove the stent on 10/09/2019   Ralph Giovanni, MD

## 2019-10-07 NOTE — Transfer of Care (Signed)
Immediate Anesthesia Transfer of Care Note  Patient: [name]  Procedure(s) Performed: Procedure(s): CYSTOSCOPY/URETEROSCOPY/HOLMIUM LASER/STENT Exhange (Left)  Patient Location: PACU  Anesthesia Type:General  Level of Consciousness: sedated  Airway & Oxygen Therapy: Patient Spontanous Breathing and Patient connected to face mask oxygen  Post-op Assessment: Report given to RN and Post -op Vital signs reviewed and stable  Post vital signs: Reviewed and stable  Last Vitals:  Vitals:   10/07/19 0728 10/07/19 1001  BP: 133/76 (!) 155/82  Pulse: 68 81  Resp: 16 20  Temp: (!) 36.1 C (!) (P) 36 C  SpO2: [id number]    Complications: No apparent anesthesia complications

## 2019-10-07 NOTE — Discharge Instructions (Signed)
AMBULATORY SURGERY  DISCHARGE INSTRUCTIONS   1) The drugs that you were given will stay in your system until tomorrow so for the next 24 hours you should not:  A) Drive an automobile B) Make any legal decisions C) Drink any alcoholic beverage   2) You may resume regular meals tomorrow.  Today it is better to start with liquids and gradually work up to solid foods.  You may eat anything you prefer, but it is better to start with liquids, then soup and crackers, and gradually work up to solid foods.   3) Please notify your doctor immediately if you have any unusual bleeding, trouble breathing, redness and pain at the surgery site, drainage, fever, or pain not relieved by medication.    4) Additional Instructions:        Please contact your physician with any problems or Same Day Surgery at [phone], Monday through Friday 6 am to 4 pm, or [place] at [facility] number at [phone].DISCHARGE INSTRUCTIONS FOR KIDNEY STONE/URETERAL STENT   MEDICATIONS:  1. Resume all your other meds from home.  2.  AZO (over-the-counter) can help with the burning/stinging when you urinate. 3.  You may take oxybutynin and pain medication as needed  ACTIVITY:  1. May resume regular activities in 24 hours. 2. No driving while on narcotic pain medications  3. Drink plenty of water  4. Continue to walk at home - you can still get blood clots when you are at home, so keep active, but don't over do it.  5. May return to work/school tomorrow or when you feel ready   BATHING:  1. You can shower. 2. You have a string coming from your urethra: The stent string is attached to your ureteral stent. Do not pull on this.   SIGNS/SYMPTOMS TO CALL:  Please call us if you have a fever greater than 101.5, uncontrolled nausea/vomiting, uncontrolled pain, dizziness, unable to urinate, bloody urine, chest pain, shortness of breath, leg swelling, leg pain, or any other concerns or questions.   You can  reach [place] at [phone].   FOLLOW-UP:  1. You we will be contacted for postop follow-up in approximately 1 month. 2. You have a string attached to your stent, you may remove it on Thursday, 10/09/2019. To do this, pull the string until the stent is completely removed. You may feel an odd sensation in your back.

## 2019-10-07 NOTE — Interval H&P Note (Signed)
History and Physical Interval Note: 69 y.o. male who underwent an attempt at left ureteroscopic stone removal on 09/16/2019 however due to anatomy the collecting system could not be accessed with the flexible ureteroscope.  The stent was placed for a period of passive dilation.  He presents today for repeat ureteroscopy.  10/07/2019 8:27 AM  [name]  has presented today for surgery, with the diagnosis of left nephrolithiasis.  The various methods of treatment have been discussed with the patient and family. After consideration of risks, benefits and other options for treatment, the patient has consented to  Procedure(s): CYSTOSCOPY/URETEROSCOPY/HOLMIUM LASER/STENT Exhange (Left) as a surgical intervention.  The patient's history has been reviewed, patient examined, no change in status, stable for surgery.  I have reviewed the patient's chart and labs.  Questions were answered to the patient's satisfaction.     [place]

## 2019-10-07 NOTE — Telephone Encounter (Signed)
App made and mailed to patient 

## 2019-10-07 NOTE — Anesthesia Postprocedure Evaluation (Signed)
Anesthesia Post Note  Patient: [name]  Procedure(s) Performed: CYSTOSCOPY/URETEROSCOPY/HOLMIUM LASER/STENT Exhange (Left )  Patient location during evaluation: PACU Anesthesia Type: General Level of consciousness: awake and alert and oriented Pain management: pain level controlled Vital Signs Assessment: post-procedure vital signs reviewed and stable Respiratory status: spontaneous breathing, nonlabored ventilation and respiratory function stable Cardiovascular status: blood pressure returned to baseline and stable Postop Assessment: no signs of nausea or vomiting Anesthetic complications: no     Last Vitals:  Vitals:   10/07/19 1031 10/07/19 1047  BP: (!) 161/89 (!) 145/72  Pulse: 70 64  Resp: 14 17  Temp: (!) 36.2 C (!) 36.2 C  SpO2: 93% 98%    Last Pain:  Vitals:   10/07/19 1047  TempSrc: Tympanic  PainSc: 0-No pain                 Kunio Cummiskey

## 2019-10-07 NOTE — Anesthesia Procedure Notes (Signed)
Procedure Name: Intubation Date/Time: 10/07/2019 8:32 AM Performed by: Doreen Salvage, CRNA Pre-anesthesia Checklist: Patient identified, Patient being monitored, Timeout performed, Emergency Drugs available and Suction available Patient Re-evaluated:Patient Re-evaluated prior to induction Oxygen Delivery Method: Circle system utilized Preoxygenation: Pre-oxygenation with 100% oxygen Induction Type: IV induction Ventilation: Mask ventilation without difficulty Laryngoscope Size: McGraph and 4 Grade View: Grade II Tube type: Oral Tube size: 7.5 mm Number of attempts: 2 Airway Equipment and Method: Stylet Placement Confirmation: ETT inserted through vocal cords under direct vision,  positive ETCO2 and breath sounds checked- equal and bilateral Secured at: 23 cm Dental Injury: Teeth and Oropharynx as per pre-operative assessment

## 2019-10-14 ENCOUNTER — Other Ambulatory Visit: Payer: Self-pay | Admitting: *Deleted

## 2019-10-14 DIAGNOSIS — N200 Calculus of kidney: Secondary | ICD-10-CM

## 2019-10-15 LAB — CALCULI, WITH PHOTOGRAPH (CLINICAL LAB)
Calcium Oxalate Dihydrate: 20 %
Calcium Oxalate Monohydrate: 80 %
Weight Calculi: 8 mg

## 2019-11-01 ENCOUNTER — Other Ambulatory Visit: Payer: Self-pay | Admitting: Family Medicine

## 2019-11-07 ENCOUNTER — Ambulatory Visit: Payer: PPO | Admitting: Urology

## 2019-11-13 ENCOUNTER — Ambulatory Visit
Admission: RE | Admit: 2019-11-13 | Discharge: 2019-11-13 | Disposition: A | Discharge status: Home / Self Care | Payer: PPO | Source: Ambulatory Visit | Attending: Urology | Admitting: Urology

## 2019-11-13 ENCOUNTER — Other Ambulatory Visit: Payer: Self-pay

## 2019-11-13 ENCOUNTER — Ambulatory Visit
Admission: RE | Admit: 2019-11-13 | Discharge: 2019-11-13 | Disposition: A | Discharge status: Home / Self Care | Payer: PPO | Attending: Urology | Admitting: Urology

## 2019-11-13 ENCOUNTER — Encounter: Payer: Self-pay | Admitting: Urology

## 2019-11-13 ENCOUNTER — Ambulatory Visit (INDEPENDENT_AMBULATORY_CARE_PROVIDER_SITE_OTHER): Payer: PPO | Admitting: Urology

## 2019-11-13 VITALS — BP 122/74 | HR 76 | Ht 66.0 in | Wt 200.0 lb

## 2019-11-13 DIAGNOSIS — N200 Calculus of kidney: Secondary | ICD-10-CM

## 2019-11-13 NOTE — Progress Notes (Signed)
11/13/2019 01:15 PM  [name] Aug 31, 1950 [id number]  Referring provider: Birdie Sons, [place],   [id number]  Chief Complaint  Patient presents with  . Nephrolithiasis    HPI: [name] is a 69 yo male who returns today for postop follow-up  -Left ureteroscopic stone removal 10/07/2019 -Stent removed postoperative day 2 and no postop problems -Stone analysis mixed calcium oxalate -KUB performed today shows no left renal/ureteral calcifications and a calcification overlying the right renal shadow  PMH: Past Medical History:  Diagnosis Date  . CAD (coronary artery disease)    a. CT scan 01/02/2015: extensive coronary atherosclerosis;  b. 04/2015 Inf STEMI/Cath/CABG x 4: LIMA->LAD, VG->Diag, VG->OM2, VG->RPL; c. 09/2015 MV: EF 55-65%, infsept/inf infarct w/o ischemia->Low Risk.  . Colon polyps   . Complete heart block ([place])    a. 04/2015 in setting of inferior STEMI-->resolved.  . Difficult intubation   . Hyperlipidemia   . Hypertension   . Ischemic cardiomyopathy    a. 04/2015 LV gram: EF 35-40%; b. 09/2015 MV: EF 55-65%.  . Kidney stones    a. May 2016.  . MI (myocardial infarction) ([place]) 04/2015  . Postoperative atrial fibrillation ([place])    a. 04/2015 Post-op CABG AF/AFlutter--> converted with rapid atrial pacing and amio; b. CHA2DS2VASc = 3->Coumadin.  . 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 CARDIAC CATHETERIZATION N/A 05/03/2015   Procedure: Left Heart Cath and Coronary Angiography;  Surgeon: Wellington Hampshire, MD;  Location: [facility] [facility];  Service: Cardiovascular;  Laterality: N/A;  . CATARACT EXTRACTION, [place]  . CORONARY ARTERY BYPASS GRAFT N/A 05/03/2015   Procedure: CORONARY ARTERY BYPASS GRAFTING (CABG) times four using left internal mammary artery and right saphenous vein.;  Surgeon: [name], MD;  Location: [facility];  Service: Open Heart Surgery;  Laterality: N/A;  . CYSTOSCOPY WITH STENT PLACEMENT Left 09/16/2019   Procedure: CYSTOSCOPY WITH STENT PLACEMENT;  Surgeon: Abbie Sons, MD;  Location: [facility];  Service: Urology;  Laterality: Left;  . CYSTOSCOPY/RETROGRADE/URETEROSCOPY Left 09/16/2019   Procedure: CYSTOSCOPY/RETROGRADE/URETEROSCOPY;  Surgeon: Abbie Sons, MD;  Location: [facility];  Service: Urology;  Laterality: Left;  . CYSTOSCOPY/URETEROSCOPY/HOLMIUM LASER/STENT PLACEMENT Left 10/07/2019   Procedure: CYSTOSCOPY/URETEROSCOPY/HOLMIUM LASER/STENT Exhange;  Surgeon: Abbie Sons, MD;  Location: [facility];  Service: Urology;  Laterality: Left;  . HERNIA REPAIR     ventral hernia repair  . JOINT REPLACEMENT    . KNEE SURGERY Left    arthroscopic knee surgery  . NECK SURGERY     Spinal fusion- neck. Dr. Sherley Bounds of [facility])  . right hip replacement Right 02/04/2014   Dr. Sabra Heck  . TEE WITHOUT CARDIOVERSION N/A 05/03/2015   Procedure: TRANSESOPHAGEAL ECHOCARDIOGRAM (TEE);  Surgeon: [name], MD;  Location: [facility];  Service: Open Heart Surgery;  Laterality: N/A;    Home Medications:  Allergies as of 11/13/2019      Reactions   Lovastatin Other (See Comments)   Chest pain   Pravastatin Other (See Comments)   Nose bleed      Medication List       Accurate as of November 13, 2019 10:05 PM. If you have any questions, ask your nurse or doctor.        amoxicillin 875 MG tablet Commonly known as: AMOXIL Take 1 tablet (875 mg total)  by mouth every 12 (twelve) hours.   aspirin 81 MG EC tablet Take 1 tablet (81 mg total) by mouth daily.   atorvastatin 80 MG tablet Commonly known as: LIPITOR TAKE ONE TABLET BY MOUTH ONCE DAILY AT 6PM What changed:   how much to take  how to take this  when to take this  additional instructions   Ensure Plus Liqd Take 237 mLs by mouth daily.   HYDROcodone-acetaminophen 5-325 MG tablet Commonly known  as: NORCO/VICODIN Take 1-2 tablets by mouth every 6 (six) hours as needed for moderate pain.   icosapent Ethyl 1 g capsule Commonly known as: VASCEPA Take 1 capsule (1 gram) by mouth twice daily What changed:   how much to take  how to take this  when to take this  additional instructions   lisinopril 10 MG tablet Commonly known as: ZESTRIL Take 1 tablet by mouth once daily   multivitamin tablet Take 1 tablet by mouth every evening.   ondansetron 4 MG tablet Commonly known as: Zofran Take 1 tablet (4 mg total) by mouth every 8 (eight) hours as needed for nausea or vomiting.   oxybutynin 5 MG tablet Commonly known as: DITROPAN 1 tab tid prn frequency,urgency, bladder spasm   tamsulosin 0.4 MG Caps capsule Commonly known as: FLOMAX Take 1 capsule (0.4 mg total) by mouth daily after breakfast.       Allergies:  Allergies  Allergen Reactions  . Lovastatin Other (See Comments)    Chest pain  . Pravastatin Other (See Comments)    Nose bleed    Family History: Family History  Problem Relation Age of Onset  . CAD Father   . Heart attack Father   . Heart Problems Sister        CABG  . Heart attack Brother   . Heart attack Brother   . Heart attack Brother   . Heart attack Brother   . Heart attack Sister   . CAD Maternal Uncle   . CAD Paternal Uncle     Social History:  reports that he has never smoked. He has never used smokeless tobacco. He reports previous alcohol use. He reports that he does not use drugs.   Physical Exam: BP 122/74   Pulse 76   Ht 5\' 6"  (1.676 m)   Wt 200 lb (90.7 kg)   BMI 32.28 kg/m   Constitutional:  Alert and oriented, No acute distress. HEENT: [place] AT, moist mucus membranes.  Trachea midline, no masses. Cardiovascular: No clubbing, cyanosis, or edema. Respiratory: Normal respiratory effort, no increased work of breathing. Skin: No rashes, bruises or suspicious lesions. Neurologic: Grossly intact, no focal deficits, moving all  4 extremities. Psychiatric: Normal mood and affect.  Pertinent Imaging: I have reviewed KUB, see Epic.  Assessment & Plan:   1. Nephrolithiasis We discussed general stone prevention techniques including drinking plenty water with goal of producing 2.5 L urine daily, increased citric acid intake, avoidance of high oxalate containing foods, and decreased salt intake.  Information about dietary recommendations given today.  He did not desire to pursue a formal metabolic evaluation  -6 month KUB f/u   Abbie Sons, MD  [place] [address], [place], [place] [address] [phone]  I, Noland Fordyce, am acting as a scribe for Dr. John Giovanni.  I have reviewed the above documentation for accuracy and completeness, and I agree with the above.   Abbie Sons, MD

## 2019-11-17 ENCOUNTER — Telehealth: Payer: Self-pay | Admitting: Family Medicine

## 2019-11-18 ENCOUNTER — Other Ambulatory Visit: Payer: Self-pay

## 2019-11-18 MED ORDER — ATORVASTATIN CALCIUM 80 MG PO TABS: ORAL_TABLET | ORAL | 3 refills | Status: DC

## 2019-11-26 ENCOUNTER — Other Ambulatory Visit: Payer: Self-pay | Admitting: *Deleted

## 2019-11-26 MED ORDER — ATORVASTATIN CALCIUM 80 MG PO TABS: ORAL_TABLET | ORAL | 0 refills | Status: DC

## 2020-04-14 NOTE — Progress Notes (Signed)
Subjective:   [name] is a 69 y.o. male who presents for an Initial Medicare Annual Wellness Visit.  I connected with Tonita Cong today by telephone and verified that I am speaking with the correct person using two identifiers. Location patient: home Location provider: work Persons participating in the virtual visit: patient, provider.   I discussed the limitations, risks, security and privacy concerns of performing an evaluation and management service by telephone and the availability of in person appointments. I also discussed with the patient that there may be a patient responsible charge related to this service. The patient expressed understanding and verbally consented to this telephonic visit.    Interactive audio and video telecommunications were attempted between this provider and patient, however failed, due to patient having technical difficulties OR patient did not have access to video capability.  We continued and completed visit with audio only.   Review of Systems    N/A  Cardiac Risk Factors include: advanced age (>24men, >48 women);dyslipidemia;male gender;hypertension     Objective:    There were no vitals filed for this visit. There is no height or weight on file to calculate BMI.  Advanced Directives 04/15/2020 10/07/2019 09/16/2019 04/10/2019 04/05/2018 02/23/2017 06/01/2015  Does Patient Have a Medical Advance Directive? No No No No No No Yes;No  Does patient want to make changes to medical advance directive? - - - - - - Yes - information given  Would patient like information on creating a medical advance directive? No - Patient declined No - Patient declined No - Patient declined No - Patient declined No - Patient declined No - Patient declined Yes - Educational materials given    Current Medications (verified) Outpatient Encounter Medications as of 04/15/2020  Medication Sig   aspirin 81 MG EC tablet Take 1 tablet (81 mg total) by mouth daily.    atorvastatin (LIPITOR) 80 MG tablet TAKE ONE TABLET BY MOUTH ONCE DAILY AT 6PM   ENSURE PLUS (ENSURE PLUS) LIQD Take 237 mLs by mouth daily.   HYDROcodone-acetaminophen (NORCO/VICODIN) 5-325 MG tablet Take 1-2 tablets by mouth every 6 (six) hours as needed for moderate pain.   icosapent Ethyl (VASCEPA) 1 g capsule Take 1 capsule (1 gram) by mouth twice daily (Patient taking differently: Take 1 g by mouth every evening. )   lisinopril (ZESTRIL) 10 MG tablet Take 1 tablet by mouth once daily   Multiple Vitamin (MULTIVITAMIN) tablet Take 1 tablet by mouth every evening.    ondansetron (ZOFRAN) 4 MG tablet Take 1 tablet (4 mg total) by mouth every 8 (eight) hours as needed for nausea or vomiting.   oxybutynin (DITROPAN) 5 MG tablet 1 tab tid prn frequency,urgency, bladder spasm   amoxicillin (AMOXIL) 875 MG tablet Take 1 tablet (875 mg total) by mouth every 12 (twelve) hours. (Patient not taking: Reported on 11/13/2019)   tamsulosin (FLOMAX) 0.4 MG CAPS capsule Take 1 capsule (0.4 mg total) by mouth daily after breakfast. (Patient not taking: Reported on 04/15/2020)   No facility-administered encounter medications on file as of 04/15/2020.    Allergies (verified) Lovastatin and Pravastatin   History: Past Medical History:  Diagnosis Date   CAD (coronary artery disease)    a. CT scan 01/02/2015: extensive coronary atherosclerosis;  b. 04/2015 Inf STEMI/Cath/CABG x 4: LIMA->LAD, VG->Diag, VG->OM2, VG->RPL; c. 09/2015 MV: EF 55-65%, infsept/inf infarct w/o ischemia->Low Risk.   Colon polyps    Complete heart block ([place])    a. 04/2015 in setting of inferior STEMI-->resolved.  Difficult intubation    Hyperlipidemia    Hypertension    Ischemic cardiomyopathy    a. 04/2015 LV gram: EF 35-40%; b. 09/2015 MV: EF 55-65%.   Kidney stones    a. May 2016.   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 04/2015 Post-op CABG AF/AFlutter--> converted  with rapid atrial pacing and amio; b. CHA2DS2VASc = 3->Coumadin.   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   Past Surgical History:  Procedure Laterality Date   CARDIAC CATHETERIZATION N/A 05/03/2015   Procedure: Left Heart Cath and Coronary Angiography;  Surgeon: Wellington Hampshire, MD;  Location: [facility];  Service: Cardiovascular;  Laterality: N/A;   CATARACT EXTRACTION, [place]   CORONARY ARTERY BYPASS GRAFT N/A 05/03/2015   Procedure: CORONARY ARTERY BYPASS GRAFTING (CABG) times four using left internal mammary artery and right saphenous vein.;  Surgeon: [name], MD;  Location: [facility];  Service: Open Heart Surgery;  Laterality: N/A;   CYSTOSCOPY WITH STENT PLACEMENT Left 09/16/2019   Procedure: CYSTOSCOPY WITH STENT PLACEMENT;  Surgeon: Abbie Sons, MD;  Location: [facility];  Service: Urology;  Laterality: Left;   CYSTOSCOPY/RETROGRADE/URETEROSCOPY Left 09/16/2019   Procedure: CYSTOSCOPY/RETROGRADE/URETEROSCOPY;  Surgeon: Abbie Sons, MD;  Location: [facility];  Service: Urology;  Laterality: Left;   CYSTOSCOPY/URETEROSCOPY/HOLMIUM LASER/STENT PLACEMENT Left 10/07/2019   Procedure: CYSTOSCOPY/URETEROSCOPY/HOLMIUM LASER/STENT Exhange;  Surgeon: Abbie Sons, MD;  Location: [facility];  Service: Urology;  Laterality: Left;   HERNIA REPAIR     ventral hernia repair   JOINT REPLACEMENT     KNEE SURGERY Left    arthroscopic knee surgery   NECK SURGERY     Spinal fusion- neck. Dr. Sherley Bounds of [place] [facility])   right hip replacement Right 02/04/2014   Dr. Sabra Heck   TEE WITHOUT CARDIOVERSION N/A 05/03/2015   Procedure: TRANSESOPHAGEAL ECHOCARDIOGRAM (TEE);  Surgeon: [name], MD;  Location: [facility];  Service: Open Heart Surgery;  Laterality: N/A;   Family History  Problem Relation Age of Onset   CAD Father    Heart attack Father    Heart Problems Sister        CABG   Heart attack Brother    Heart attack  Brother    Heart attack Brother    Heart attack Brother    Heart attack Sister    CAD Maternal Uncle    CAD Paternal Uncle    Social History   Socioeconomic History   Marital status: Married    Spouse name: Not on file   Number of children: 1   Years of education: Not on file   Highest education level: Associate degree: occupational, Hotel manager, or vocational program  Occupational History   Occupation: Administrator    Comment: part time  Tobacco Use   Smoking status: Never Smoker   Smokeless tobacco: Never Used  Scientific laboratory technician Use: Never used  Substance and Sexual Activity   Alcohol use: Not Currently    Alcohol/week: 0.0 standard drinks    Comment: rarely   Drug use: No   Sexual activity: Yes    Birth control/protection: None  Other Topics Concern   Not on file  Social History Narrative   Not on file   Social Determinants of Health   Financial Resource Strain: Low Risk    Difficulty of Paying Living Expenses: Not hard at all  Food Insecurity: No Food  Insecurity   Worried About Charity fundraiser in the Last Year: Never true   [place] in the Last Year: Never true  Transportation Needs: No Transportation Needs   Lack of Transportation (Medical): No   Lack of Transportation (Non-Medical): No  Physical Activity: Inactive   Days of Exercise per Week: 0 days   Minutes of Exercise per Session: 0 min  Stress: No Stress Concern Present   Feeling of Stress : Only a little  Social Connections: Moderately Isolated   Frequency of Communication with Friends and Family: More than three times a week   Frequency of Social Gatherings with Friends and Family: More than three times a week   Attends Religious Services: Never   Marine scientist or Organizations: No   Attends Music therapist: Never   Marital Status: Married    Tobacco Counseling Counseling given: Not Answered   Clinical Intake:  Pre-visit  preparation completed: Yes  Pain : No/denies pain     Nutritional Risks: None Diabetes: No  How often do you need to have someone help you when you read instructions, pamphlets, or other written materials from your doctor or pharmacy?: 1 - Never  Diabetic? No  Interpreter Needed?: No  Information entered by :: [facility], LPN   Activities of Daily Living In your present state of health, do you have any difficulty performing the following activities: 04/15/2020 09/16/2019  Hearing? N N  Vision? N N  Comment Wears eye glasses. -  Difficulty concentrating or making decisions? N N  Walking or climbing stairs? N N  Dressing or bathing? N N  Doing errands, shopping? N N  Preparing Food and eating ? N -  Using the Toilet? N -  In the past six months, have you accidently leaked urine? Y -  Comment Occasionally due to waiting too long to empty bladder. -  Do you have problems with loss of bowel control? N -  Managing your Medications? N -  Managing your Finances? N -  Housekeeping or managing your Housekeeping? N -  Some recent data might be hidden    Patient Care Team: Birdie Sons, MD as PCP - General (Family Medicine) Wellington Hampshire, MD as PCP - Cardiology (Cardiology) Earnestine Leys, MD as Consulting Physician (Specialist) Abbie Sons, MD (Urology) Ralene Bathe, MD (Dermatology) [place], [place] [facility])  Indicate any recent Medical Services you may have received from other than [facility] providers in the past year (date may be approximate).     Assessment:   This is a routine wellness examination for Safi.  Hearing/Vision screen No exam data present  Dietary issues and exercise activities discussed: Current Exercise Habits: The patient does not participate in regular exercise at present, Exercise limited by: None identified  Goals     Exercise 3x per week (30 min per time)     Recommend to exercise for 3 days a week for at least 30 minutes at a  time.       Depression Screen PHQ 2/9 Scores 04/15/2020 04/10/2019 04/05/2018 04/05/2018 02/23/2017 02/23/2017 09/15/2015  PHQ - 2 Score 0 0 0 0 0 0 0  PHQ- 9 Score - - 1 - 3 - -    Fall Risk Fall Risk  04/15/2020 04/10/2019 04/05/2018 02/23/2017 09/15/2015  Falls in the past year? 0 1 No Yes No  Number falls in past yr: 0 0 - 1 -  Comment - - - tripped -  Injury with Fall? 0 0 - No -  Follow up - Falls prevention discussed - Falls prevention discussed -    Any stairs in or around the home? Yes  If so, are there any without handrails? No  Home free of loose throw rugs in walkways, pet beds, electrical cords, etc? Yes  Adequate lighting in your home to reduce risk of falls? Yes   ASSISTIVE DEVICES UTILIZED TO PREVENT FALLS:  Life alert? No  Use of a cane, walker or w/c? No  Grab bars in the bathroom? Yes  Shower chair or bench in shower? Yes  Elevated toilet seat or a handicapped toilet? Yes    Cognitive Function: Declined today.     6CIT Screen 02/23/2017  What Year? 0 points  What month? 0 points  What time? 0 points  Count back from 20 0 points  Months in reverse 0 points  Repeat phrase 0 points  Total Score 0    Immunizations Immunization History  Administered Date(s) Administered   Fluad Quad(high Dose 65+) 05/02/2019   Influenza, High Dose Seasonal PF 06/02/2017, 04/05/2018   Influenza-Unspecified 05/07/2015   Pneumococcal Conjugate-13 09/15/2015   Pneumococcal Polysaccharide-23 02/23/2017   Tdap 06/19/2008   Zoster 09/15/2015    TDAP status: Due, Education has been provided regarding the importance of this vaccine. Advised may receive this vaccine at local pharmacy or Health Dept. Aware to provide a copy of the vaccination record if obtained from local pharmacy or Health Dept. Verbalized acceptance and understanding. Flu Vaccine status: Due fall 2021 Pneumococcal vaccine status: Up to date Covid-19 vaccine status: Declined, Education has been provided regarding  the importance of this vaccine but patient still declined. Advised may receive this vaccine at local pharmacy or Health Dept.or vaccine clinic. Aware to provide a copy of the vaccination record if obtained from local pharmacy or Health Dept. Verbalized acceptance and understanding.  Qualifies for Shingles Vaccine? Yes   Zostavax completed Yes   Shingrix Completed?: No.    Education has been provided regarding the importance of this vaccine. Patient has been advised to call insurance company to determine out of pocket expense if they have not yet received this vaccine. Advised may also receive vaccine at local pharmacy or Health Dept. Verbalized acceptance and understanding.  Screening Tests Health Maintenance  Topic Date Due   COVID-19 Vaccine (1) Never done   COLONOSCOPY  01/13/2016   INFLUENZA VACCINE  03/07/2020   TETANUS/TDAP  04/15/2021 (Originally 06/19/2018)   Hepatitis C Screening  Completed   PNA vac Low Risk Adult  Completed    Health Maintenance  Health Maintenance Due  Topic Date Due   COVID-19 Vaccine (1) Never done   COLONOSCOPY  01/13/2016   INFLUENZA VACCINE  03/07/2020    Colorectal cancer screening: Currently due. Declined referral and cologuard order at this time.   Lung Cancer Screening: (Low Dose CT Chest recommended if Age 39-80 years, 30 pack-year currently smoking OR have quit w/in 15years.) does not qualify.    Additional Screening:  Hepatitis C Screening: Up to date  Vision Screening: Recommended annual ophthalmology exams for early detection of glaucoma and other disorders of the eye. Is the patient up to date with their annual eye exam?  Yes  Who is the provider or what is the name of the office in which the patient attends annual eye exams? [facility] If pt is not established with a provider, would they like to be referred to a provider to establish care?  No .   Dental Screening: Recommended annual dental exams for proper oral  hygiene  Community Resource Referral / Chronic Care Management: CRR required this visit?  No   CCM required this visit?  No      Plan:     I have personally reviewed and noted the following in the patients chart:    Medical and social history  Use of alcohol, tobacco or illicit drugs   Current medications and supplements  Functional ability and status  Nutritional status  Physical activity  Advanced directives  List of other physicians  Hospitalizations, surgeries, and ER visits in previous 12 months  Vitals  Screenings to include cognitive, depression, and falls  Referrals and appointments  In addition, I have reviewed and discussed with patient certain preventive protocols, quality metrics, and best practice recommendations. A written personalized care plan for preventive services as well as general preventive health recommendations were provided to patient.     Aadhira Heffernan [place], [place]   08/15/5907   Nurse Notes: Flu shot due this fall. Pt declined a colonoscopy referral, cologuard order and a Covid vaccine at this time.

## 2020-04-15 ENCOUNTER — Ambulatory Visit (INDEPENDENT_AMBULATORY_CARE_PROVIDER_SITE_OTHER): Payer: PPO

## 2020-04-15 ENCOUNTER — Other Ambulatory Visit: Payer: Self-pay

## 2020-04-15 DIAGNOSIS — Z0000 Encounter for general adult medical examination without abnormal findings: Secondary | ICD-10-CM

## 2020-04-15 NOTE — Patient Instructions (Signed)
[name] , Thank you for taking time to come for your Medicare Wellness Visit. I appreciate your ongoing commitment to your health goals. Please review the following plan we discussed and let me know if I can assist you in the future.   Screening recommendations/referrals: Colonoscopy: Currently due. Declined referral and cologuard order at this time.  Recommended yearly ophthalmology/optometry visit for glaucoma screening and checkup Recommended yearly dental visit for hygiene and checkup  Vaccinations: Influenza vaccine: Due fall 2021 Pneumococcal vaccine: Completed series Tdap vaccine: Currently due, declined at this time. Shingles vaccine: Shingrix discussed. Please contact your pharmacy for coverage information.     Advanced directives: Advance directive discussed with you today. Even though you declined this today please call our office should you change your mind and we can give you the proper paperwork for you to fill out.  Conditions/risks identified: Recommend to exercise for 3 days a week for at least 30 minutes at a time.   Next appointment: 04/21/21 @ 8:20 AM for an AWV. Declined scheduling a follow up with PCP at this time.   Preventive Care 71 Years and Older, Male Preventive care refers to lifestyle choices and visits with your health care provider that can promote health and wellness. What does preventive care include?  A yearly physical exam. This is also called an annual well check.  Dental exams once or twice a year.  Routine eye exams. Ask your health care provider how often you should have your eyes checked.  Personal lifestyle choices, including:  Daily care of your teeth and gums.  Regular physical activity.  Eating a healthy diet.  Avoiding tobacco and drug use.  Limiting alcohol use.  Practicing safe sex.  Taking low doses of aspirin every day.  Taking vitamin and mineral supplements as recommended by your health care provider. What happens  during an annual well check? The services and screenings done by your health care provider during your annual well check will depend on your age, overall health, lifestyle risk factors, and family history of disease. Counseling  Your health care provider may ask you questions about your:  Alcohol use.  Tobacco use.  Drug use.  Emotional well-being.  Home and relationship well-being.  Sexual activity.  Eating habits.  History of falls.  Memory and ability to understand (cognition).  Work and work Statistician. Screening  You may have the following tests or measurements:  Height, weight, and BMI.  Blood pressure.  Lipid and cholesterol levels. These may be checked every 5 years, or more frequently if you are over 71 years old.  Skin check.  Lung cancer screening. You may have this screening every year starting at age 71 if you have a 30-pack-year history of smoking and currently smoke or have quit within the past 15 years.  Fecal occult blood test (FOBT) of the stool. You may have this test every year starting at age 71.  Flexible sigmoidoscopy or colonoscopy. You may have a sigmoidoscopy every 5 years or a colonoscopy every 10 years starting at age 71.  Prostate cancer screening. Recommendations will vary depending on your family history and other risks.  Hepatitis C blood test.  Hepatitis B blood test.  Sexually transmitted disease (STD) testing.  Diabetes screening. This is done by checking your blood sugar (glucose) after you have not eaten for a while (fasting). You may have this done every 1-3 years.  Abdominal aortic aneurysm (AAA) screening. You may need this if you are a current or former  smoker.  Osteoporosis. You may be screened starting at age 71 if you are at high risk. Talk with your health care provider about your test results, treatment options, and if necessary, the need for more tests. Vaccines  Your health care provider may recommend certain  vaccines, such as:  Influenza vaccine. This is recommended every year.  Tetanus, diphtheria, and acellular pertussis (Tdap, Td) vaccine. You may need a Td booster every 10 years.  Zoster vaccine. You may need this after age 71.  Pneumococcal 13-valent conjugate (PCV13) vaccine. One dose is recommended after age 71.  Pneumococcal polysaccharide (PPSV23) vaccine. One dose is recommended after age 71. Talk to your health care provider about which screenings and vaccines you need and how often you need them. This information is not intended to replace advice given to you by your health care provider. Make sure you discuss any questions you have with your health care provider. Document Released: 08/20/2015 Document Revised: 04/12/2016 Document Reviewed: 05/25/2015 Elsevier Interactive Patient Education  2017 [place] Prevention in the Home Falls can cause injuries. They can happen to people of all ages. There are many things you can do to make your home safe and to help prevent falls. What can I do on the outside of my home?  Regularly fix the edges of walkways and driveways and fix any cracks.  Remove anything that might make you trip as you walk through a door, such as a raised step or threshold.  Trim any bushes or trees on the path to your home.  Use bright outdoor lighting.  Clear any walking paths of anything that might make someone trip, such as rocks or tools.  Regularly check to see if handrails are loose or broken. Make sure that both sides of any steps have handrails.  Any raised decks and porches should have guardrails on the edges.  Have any leaves, snow, or ice cleared regularly.  Use sand or salt on walking paths during winter.  Clean up any spills in your garage right away. This includes oil or grease spills. What can I do in the bathroom?  Use night lights.  Install grab bars by the toilet and in the tub and shower. Do not use towel bars as grab  bars.  Use non-skid mats or decals in the tub or shower.  If you need to sit down in the shower, use a plastic, non-slip stool.  Keep the floor dry. Clean up any water that spills on the floor as soon as it happens.  Remove soap buildup in the tub or shower regularly.  Attach bath mats securely with double-sided non-slip rug tape.  Do not have throw rugs and other things on the floor that can make you trip. What can I do in the bedroom?  Use night lights.  Make sure that you have a light by your bed that is easy to reach.  Do not use any sheets or blankets that are too big for your bed. They should not hang down onto the floor.  Have a firm chair that has side arms. You can use this for support while you get dressed.  Do not have throw rugs and other things on the floor that can make you trip. What can I do in the kitchen?  Clean up any spills right away.  Avoid walking on wet floors.  Keep items that you use a lot in easy-to-reach places.  If you need to reach something above you, use a  strong step stool that has a grab bar.  Keep electrical cords out of the way.  Do not use floor polish or wax that makes floors slippery. If you must use wax, use non-skid floor wax.  Do not have throw rugs and other things on the floor that can make you trip. What can I do with my stairs?  Do not leave any items on the stairs.  Make sure that there are handrails on both sides of the stairs and use them. Fix handrails that are broken or loose. Make sure that handrails are as long as the stairways.  Check any carpeting to make sure that it is firmly attached to the stairs. Fix any carpet that is loose or worn.  Avoid having throw rugs at the top or bottom of the stairs. If you do have throw rugs, attach them to the floor with carpet tape.  Make sure that you have a light switch at the top of the stairs and the bottom of the stairs. If you do not have them, ask someone to add them for  you. What else can I do to help prevent falls?  Wear shoes that:  Do not have high heels.  Have rubber bottoms.  Are comfortable and fit you well.  Are closed at the toe. Do not wear sandals.  If you use a stepladder:  Make sure that it is fully opened. Do not climb a closed stepladder.  Make sure that both sides of the stepladder are locked into place.  Ask someone to hold it for you, if possible.  Clearly mark and make sure that you can see:  Any grab bars or handrails.  First and last steps.  Where the edge of each step is.  Use tools that help you move around (mobility aids) if they are needed. These include:  Canes.  Walkers.  Scooters.  Crutches.  Turn on the lights when you go into a dark area. Replace any light bulbs as soon as they burn out.  Set up your furniture so you have a clear path. Avoid moving your furniture around.  If any of your floors are uneven, fix them.  If there are any pets around you, be aware of where they are.  Review your medicines with your doctor. Some medicines can make you feel dizzy. This can increase your chance of falling. Ask your doctor what other things that you can do to help prevent falls. This information is not intended to replace advice given to you by your health care provider. Make sure you discuss any questions you have with your health care provider. Document Released: 05/20/2009 Document Revised: 12/30/2015 Document Reviewed: 08/28/2014 Elsevier Interactive Patient Education  2017 [employer].

## 2020-04-25 ENCOUNTER — Other Ambulatory Visit: Payer: Self-pay | Admitting: Family Medicine

## 2020-04-25 NOTE — Telephone Encounter (Signed)
Requested medications are due for refill today?  Yes  Requested medications are on active medication list?  Yes  Last Refill:   11/01/2019  # 90 with one refill  Future visit scheduled?  No   Notes to Clinic:  Medication failed Rx refill protocol due to no valid encounter in the past 6 months and no labs within the past 180 days.  Last office visit was 11 months ago.

## 2020-05-11 ENCOUNTER — Other Ambulatory Visit: Payer: Self-pay | Admitting: *Deleted

## 2020-05-11 DIAGNOSIS — N200 Calculus of kidney: Secondary | ICD-10-CM

## 2020-05-14 ENCOUNTER — Ambulatory Visit: Payer: Self-pay | Admitting: Urology

## 2020-05-21 ENCOUNTER — Telehealth: Payer: Self-pay | Admitting: Nurse Practitioner

## 2020-05-21 ENCOUNTER — Ambulatory Visit
Admission: RE | Admit: 2020-05-21 | Discharge: 2020-05-21 | Disposition: A | Discharge status: Home / Self Care | Payer: PPO | Source: Ambulatory Visit | Attending: Urology | Admitting: Urology

## 2020-05-21 ENCOUNTER — Telehealth: Payer: Self-pay | Admitting: Cardiovascular Disease

## 2020-05-21 ENCOUNTER — Ambulatory Visit
Admission: RE | Admit: 2020-05-21 | Discharge: 2020-05-21 | Disposition: A | Discharge status: Home / Self Care | Payer: PPO | Attending: Urology | Admitting: Urology

## 2020-05-21 DIAGNOSIS — N200 Calculus of kidney: Secondary | ICD-10-CM | POA: Insufficient documentation

## 2020-05-21 DIAGNOSIS — I25810 Atherosclerosis of coronary artery bypass graft(s) without angina pectoris: Secondary | ICD-10-CM

## 2020-05-21 DIAGNOSIS — M47816 Spondylosis without myelopathy or radiculopathy, lumbar region: Secondary | ICD-10-CM | POA: Diagnosis not present

## 2020-05-21 DIAGNOSIS — Z0289 Encounter for other administrative examinations: Secondary | ICD-10-CM

## 2020-05-21 NOTE — Telephone Encounter (Signed)
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

## 2020-05-21 NOTE — Telephone Encounter (Signed)
Patient last saw Ignacia Bayley, NP last year 07/11/2019: 4.  Disposition: Follow-up lipids and LFTs in 6 weeks.  He will contact us if he has any trouble getting Vascepa through his insurance.  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   Routing to [place], NP to make sure ok to go ahead and schedule. Patient is scheduled to come in to see him on 05/27/20.

## 2020-05-21 NOTE — Telephone Encounter (Signed)
Called patient's home and wife answered. Patient is currently out mowing the grass.  She will have the patient call back.

## 2020-05-21 NOTE — Telephone Encounter (Signed)
As long as he has not been having any new symptoms (chest pain, increasing shortness of breath), it's ok to arrange for outpatient exercise treadmill test in the office, prior to office visit.  He will still need to keep office visit. Any chance that they can be done on the same day (ETT followed by visit)?

## 2020-05-21 NOTE — Telephone Encounter (Signed)
Patient returning call.

## 2020-05-24 NOTE — Telephone Encounter (Signed)
Spoke with patients wife per release form. She states that he could do only Thursday or Friday with the ETT here in the office. Confirmed patient will need to keep scheduled appointment and she verbalized understanding. Advised that someone would call regarding appointment for ETT and when that is scheduled we can then review instructions for testing. She verbalized understanding with no further questions at this time.

## 2020-05-27 ENCOUNTER — Ambulatory Visit: Payer: PPO | Admitting: Nurse Practitioner

## 2020-05-27 ENCOUNTER — Other Ambulatory Visit: Payer: Self-pay

## 2020-05-27 ENCOUNTER — Encounter: Payer: Self-pay | Admitting: Nurse Practitioner

## 2020-05-27 VITALS — BP 110/68 | HR 66 | Ht 66.0 in | Wt 197.0 lb

## 2020-05-27 DIAGNOSIS — I10 Essential (primary) hypertension: Secondary | ICD-10-CM | POA: Diagnosis not present

## 2020-05-27 DIAGNOSIS — E785 Hyperlipidemia, unspecified: Secondary | ICD-10-CM

## 2020-05-27 DIAGNOSIS — I25810 Atherosclerosis of coronary artery bypass graft(s) without angina pectoris: Secondary | ICD-10-CM | POA: Diagnosis not present

## 2020-05-27 NOTE — Progress Notes (Signed)
05/28/2020 9:51 AM   [name] 04-24-1951 [id number]  Referring provider: Birdie Sons, MD [address] [place],  [place] [address] Chief Complaint  Patient presents with  . Nephrolithiasis    HPI: [name] is a 69 y.o. male who returns for a 6 month follow up of nephrolithiasis.   -Left ureteroscopic stone removal 10/07/2019 -Stone analysis mixed calcium oxalate -KUB on 05/21/2020 noted persistent right-sided nephrolithiasis, not significantly changed from prior study. -Today the patient is doing well.  -No hematuria or dysuria. -No flank, abdominal or pelvic pain.   PMH: Past Medical History:  Diagnosis Date  . CAD (coronary artery disease)    a. CT scan 01/02/2015: extensive coronary atherosclerosis;  b. 04/2015 Inf STEMI/Cath/CABG x 4: LIMA->LAD, VG->Diag, VG->OM2, VG->RPL; c. 09/2015 MV: EF 55-65%, infsept/inf infarct w/o ischemia->Low Risk.  . Colon polyps   . Complete heart block ([place])    a. 04/2015 in setting of inferior STEMI-->resolved.  . Difficult intubation   . Hyperlipidemia   . Hypertension   . Ischemic cardiomyopathy    a. 04/2015 LV gram: EF 35-40%; b. 09/2015 MV: EF 55-65%.  . Kidney stones    a. May 2016.  . MI (myocardial infarction) ([place] [facility]) 04/2015  . Postoperative atrial fibrillation ([place])    a. 04/2015 Post-op CABG AF/AFlutter--> converted with rapid atrial pacing and amio; b. CHA2DS2VASc = 3->Coumadin.  . 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 CARDIAC CATHETERIZATION N/A 05/03/2015   Procedure: Left Heart Cath and Coronary Angiography;  Surgeon: Wellington Hampshire, MD;  Location: [facility];  Service: Cardiovascular;  Laterality: N/A;  . CATARACT EXTRACTION, [place]  . CORONARY ARTERY BYPASS GRAFT N/A 05/03/2015   Procedure: CORONARY ARTERY BYPASS GRAFTING (CABG) times four using left internal mammary artery and right saphenous vein.;   Surgeon: [name], MD;  Location: [facility];  Service: Open Heart Surgery;  Laterality: N/A;  . CYSTOSCOPY WITH STENT PLACEMENT Left 09/16/2019   Procedure: CYSTOSCOPY WITH STENT PLACEMENT;  Surgeon: Abbie Sons, MD;  Location: [facility];  Service: Urology;  Laterality: Left;  . CYSTOSCOPY/RETROGRADE/URETEROSCOPY Left 09/16/2019   Procedure: CYSTOSCOPY/RETROGRADE/URETEROSCOPY;  Surgeon: Abbie Sons, MD;  Location: [facility];  Service: Urology;  Laterality: Left;  . CYSTOSCOPY/URETEROSCOPY/HOLMIUM LASER/STENT PLACEMENT Left 10/07/2019   Procedure: CYSTOSCOPY/URETEROSCOPY/HOLMIUM LASER/STENT Exhange;  Surgeon: Abbie Sons, MD;  Location: [facility];  Service: Urology;  Laterality: Left;  . HERNIA REPAIR     ventral hernia repair  . JOINT REPLACEMENT    . KNEE SURGERY Left    arthroscopic knee surgery  . NECK SURGERY     Spinal fusion- neck. Dr. Sherley Bounds of [facility])  . right hip replacement Right 02/04/2014   Dr. Sabra Heck  . TEE WITHOUT CARDIOVERSION N/A 05/03/2015   Procedure: TRANSESOPHAGEAL ECHOCARDIOGRAM (TEE);  Surgeon: [name], MD;  Location: [facility];  Service: Open Heart Surgery;  Laterality: N/A;    Home Medications:  Allergies as of 05/28/2020      Reactions   Lovastatin Other (See Comments)   Chest pain   Pravastatin Other (See Comments)   Nose bleed      Medication List       Accurate as of May 28, 2020  9:51 AM. If you have any questions, ask your nurse or doctor.        STOP taking  these medications   amoxicillin 875 MG tablet Commonly known as: AMOXIL Stopped by: Abbie Sons, MD   HYDROcodone-acetaminophen 5-325 MG tablet Commonly known as: NORCO/VICODIN Stopped by: Abbie Sons, MD   ondansetron 4 MG tablet Commonly known as: Zofran Stopped by: Abbie Sons, MD   oxybutynin 5 MG tablet Commonly known as: DITROPAN Stopped by: Abbie Sons, MD   tamsulosin 0.4 MG Caps capsule Commonly  known as: FLOMAX Stopped by: Abbie Sons, MD     TAKE these medications   aspirin 81 MG EC tablet Take 1 tablet (81 mg total) by mouth daily.   atorvastatin 80 MG tablet Commonly known as: LIPITOR TAKE ONE TABLET BY MOUTH ONCE DAILY AT 6PM   Ensure Plus Liqd Take 237 mLs by mouth daily.   Vascepa 1 g capsule Generic drug: icosapent Ethyl Take 2 g by mouth daily. What changed: Another medication with the same name was changed. Make sure you understand how and when to take each.   icosapent Ethyl 1 g capsule Commonly known as: VASCEPA Take 1 capsule (1 gram) by mouth twice daily What changed:   how much to take  how to take this  when to take this  additional instructions   lisinopril 10 MG tablet Commonly known as: ZESTRIL Take 1 tablet by mouth once daily   multivitamin tablet Take 1 tablet by mouth every evening.       Allergies:  Allergies  Allergen Reactions  . Lovastatin Other (See Comments)    Chest pain  . Pravastatin Other (See Comments)    Nose bleed    Family History: Family History  Problem Relation Age of Onset  . CAD Father   . Heart attack Father   . Heart Problems Sister        CABG  . Heart attack Brother   . Heart attack Brother   . Heart attack Brother   . Heart attack Brother   . Heart attack Sister   . CAD Maternal Uncle   . CAD Paternal Uncle     Social History:  reports that he has never smoked. He has never used smokeless tobacco. He reports previous alcohol use. He reports that he does not use drugs.   Physical Exam: BP (!) 152/78   Pulse 72   Ht 5\' 6"  (1.676 m)   Wt 197 lb (89.4 kg)   BMI 31.80 kg/m   Constitutional:  Alert and oriented, No acute distress. HEENT: [place] AT, moist mucus membranes.  Trachea midline, no masses. Cardiovascular: No clubbing, cyanosis, or edema. Respiratory: Normal respiratory effort, no increased work of breathing. Skin: No rashes, bruises or suspicious lesions. Neurologic: Grossly  intact, no focal deficits, moving all 4 extremities. Psychiatric: Normal mood and affect.  Laboratory Data:  Lab Results  Component Value Date   CREATININE 1.46 (H) 09/16/2019    Pertinent Imaging:  Abdomen 1 view (KUB)  Narrative CLINICAL DATA:  Kidney stones.  EXAM: ABDOMEN - 1 VIEW  COMPARISON:  11/13/2019  FINDINGS: There is persistent right-sided nephrolithiasis, unchanged from prior study. There is a questionable stone projecting over the upper pole the left kidney. The bowel gas pattern is nonobstructive. Multiple calcifications project over the patient's pelvis. These are essentially stable from prior study and are favored to represent phleboliths. The patient is status post total hip arthroplasty on the right. There are degenerative changes throughout the lumbar spine.  IMPRESSION: Persistent right-sided nephrolithiasis, not significantly changed from prior study.  Electronically Signed By: Constance Holster M.D. On: 05/22/2020 20:26  I have personally reviewed the images and agree with radiologist interpretation.    Assessment & Plan:    1. Nephrolithiasis  Asymptomatic KUB showed stable right nephrolithiasis If KUB is remains stable next year will consider follow ups every 2 years.  Follow up in 1 year with KUB.    [facility] [address], [place], [place] [address] [phone]  I, Selena Batten, am acting as a scribe for Dr. Nicki Reaper C. Emanuel Dowson,  I have reviewed the above documentation for accuracy and completeness, and I agree with the above.   Abbie Sons, MD

## 2020-05-27 NOTE — Progress Notes (Signed)
Office Visit    Patient Name: [name] Date of Encounter: 05/27/2020  Primary Care Provider:  Birdie Sons, MD Primary Cardiologist:  Kathlyn Sacramento, MD  Chief Complaint    69 y/o male with a history of CAD s/p STEMI/CABG x4 (2016), hypertension, and hyperlipidemia, who presents for annual follow-up of CAD.   Past Medical History    Past Medical History:  Diagnosis Date  . CAD (coronary artery disease)    a. CT scan 01/02/2015: extensive coronary atherosclerosis;  b. 04/2015 Inf STEMI/Cath/CABG x 4: LIMA->LAD, VG->Diag, VG->OM2, VG->RPL; c. 09/2015 MV: EF 55-65%, infsept/inf infarct w/o ischemia->Low Risk.  . Colon polyps   . Complete heart block ([place])    a. 04/2015 in setting of inferior STEMI-->resolved.  . Difficult intubation   . Hyperlipidemia   . Hypertension   . Ischemic cardiomyopathy    a. 04/2015 LV gram: EF 35-40%; b. 09/2015 MV: EF 55-65%.  . Kidney stones    a. May 2016.  . MI (myocardial infarction) ([place]) 04/2015  . Postoperative atrial fibrillation ([place])    a. 04/2015 Post-op CABG AF/AFlutter--> converted with rapid atrial pacing and amio; b. CHA2DS2VASc = 3->Coumadin.  . 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 CARDIAC CATHETERIZATION N/A 05/03/2015   Procedure: Left Heart Cath and Coronary Angiography;  Surgeon: Wellington Hampshire, MD;  Location: [facility];  Service: Cardiovascular;  Laterality: N/A;  . CATARACT EXTRACTION, [place]  . CORONARY ARTERY BYPASS GRAFT N/A 05/03/2015   Procedure: CORONARY ARTERY BYPASS GRAFTING (CABG) times four using left internal mammary artery and right saphenous vein.;  Surgeon: [name], MD;  Location: [facility];  Service: Open Heart Surgery;  Laterality: N/A;  . CYSTOSCOPY WITH STENT PLACEMENT Left 09/16/2019   Procedure: CYSTOSCOPY WITH STENT PLACEMENT;  Surgeon: Abbie Sons, MD;  Location: [facility];  Service: Urology;  Laterality: Left;    . CYSTOSCOPY/RETROGRADE/URETEROSCOPY Left 09/16/2019   Procedure: CYSTOSCOPY/RETROGRADE/URETEROSCOPY;  Surgeon: Abbie Sons, MD;  Location: [facility];  Service: Urology;  Laterality: Left;  . CYSTOSCOPY/URETEROSCOPY/HOLMIUM LASER/STENT PLACEMENT Left 10/07/2019   Procedure: CYSTOSCOPY/URETEROSCOPY/HOLMIUM LASER/STENT Exhange;  Surgeon: Abbie Sons, MD;  Location: [facility];  Service: Urology;  Laterality: Left;  . HERNIA REPAIR     ventral hernia repair  . JOINT REPLACEMENT    . KNEE SURGERY Left    arthroscopic knee surgery  . NECK SURGERY     Spinal fusion- neck. Dr. Sherley Bounds of [facility])  . right hip replacement Right 02/04/2014   Dr. Sabra Heck  . TEE WITHOUT CARDIOVERSION N/A 05/03/2015   Procedure: TRANSESOPHAGEAL ECHOCARDIOGRAM (TEE);  Surgeon: [name], MD;  Location: [facility];  Service: Open Heart Surgery;  Laterality: N/A;    Allergies  Allergies  Allergen Reactions  . Lovastatin Other (See Comments)    Chest pain  . Pravastatin Other (See Comments)    Nose bleed    History of Present Illness    69 y/o male with the above past medical history including CAD s/p STEMI/CABG x4 (2016), hypertension, and hyperlipidemia. Catheterization at the time of STEMI revealed severe multivessel disease and was complicated by complete heart block. He required four-vessel bypass. He did have postop atrial fibrillation and flutter and was on amiodarone and Coumadin for a period of time postoperatively. 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   He was last seen in clinic in December 2020 for his annual follow-up at which time he was doing well. He has continued to do well. He does report some anxiety over the past year and rare fleeting "twinges" in his chest, that occur approximately 1-2x/month, and last only a few seconds before resolving spontaneously. He also has occasional nocturnal leg cramps, which  improve with ambulation. 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 Given that his CABG was over five years ago, he is due for an ETT in order to renew his license and would like to schedule this as soon as possible. He denies chest pain, palpitations, dyspnea, pnd, orthopnea, n, v, dizziness, syncope, edema, weight gain, or early satiety.   Home Medications    Prior to Admission medications   Medication Sig Start Date End Date Taking? Authorizing Provider  amoxicillin (AMOXIL) 875 MG tablet Take 1 tablet (875 mg total) by mouth every 12 (twelve) hours. Patient not taking: Reported on 11/13/2019 10/03/19   Abbie Sons, MD  aspirin 81 MG EC tablet Take 1 tablet (81 mg total) by mouth daily. 05/17/15   Theora Gianotti, NP  atorvastatin (LIPITOR) 80 MG tablet TAKE ONE TABLET BY MOUTH ONCE DAILY AT 6PM 11/26/19   Theora Gianotti, NP  ENSURE PLUS (ENSURE PLUS) LIQD Take 237 mLs by mouth daily.    [provider]  HYDROcodone-acetaminophen (NORCO/VICODIN) 5-325 MG tablet Take 1-2 tablets by mouth every 6 (six) hours as needed for moderate pain. 09/15/19   Stoioff, Ronda Fairly, MD  icosapent Ethyl (VASCEPA) 1 g capsule Take 1 capsule (1 gram) by mouth twice daily Patient taking differently: Take 1 g by mouth every evening.  07/16/19   Theora Gianotti, NP  lisinopril (ZESTRIL) 10 MG tablet Take 1 tablet by mouth once daily 04/26/20   Birdie Sons, MD  Multiple Vitamin (MULTIVITAMIN) tablet Take 1 tablet by mouth every evening.     [provider]  ondansetron (ZOFRAN) 4 MG tablet Take 1 tablet (4 mg total) by mouth every 8 (eight) hours as needed for nausea or vomiting. 09/15/19   Stoioff, Ronda Fairly, MD  oxybutynin (DITROPAN) 5 MG tablet 1 tab tid prn frequency,urgency, bladder spasm 09/16/19   Stoioff, Ronda Fairly, MD  tamsulosin (FLOMAX) 0.4 MG CAPS capsule Take 1 capsule (0.4 mg  total) by mouth daily after breakfast. Patient not taking: Reported on 04/15/2020 09/16/19   Abbie Sons, MD    Review of Systems    Increased mild anxiety over the past year, rare fleeting "twinges" in his chest, and occasional nocturnal leg cramps. He denies angina, palpitations, dyspnea, pnd, orthopnea, n, v, dizziness, syncope, edema, weight gain, or early satiety. All other systems reviewed and are otherwise negative except as noted above.  Physical Exam    VS:  BP 110/68 (BP Location: Left Arm, Patient Position: Sitting, Cuff Size: Normal)   Pulse 66   Ht 5\' 6"  (1.676 m)   Wt 197 lb (89.4 kg)   SpO2 97%   BMI 31.80 kg/m   GEN: Well nourished, well developed, in no acute distress. HEENT: normal. Neck: Supple, no JVD, carotid bruits, or masses. Cardiac: RRR, no murmurs, rubs, or gallops. No clubbing, cyanosis, edema.  PT 2+ and equal bilaterally.  Respiratory:  Respirations regular and unlabored, clear to auscultation bilaterally. GI: Soft, nontender, nondistended, BS +  x 4. MS: no deformity or atrophy. Skin: warm and dry, no rash. Neuro:  Strength and sensation are intact. Psych: Normal affect.  Accessory Clinical Findings    ECG personally reviewed by me today - Sinus rhythm with 1st degree AV block, 66, prior inferior infarct - no acute changes.  Lab Results  Component Value Date   WBC 15.3 (H) 09/16/2019   HGB 13.4 09/16/2019   HCT 41.5 09/16/2019   MCV 83.0 09/16/2019   PLT 201 09/16/2019   Lab Results  Component Value Date   CREATININE 1.46 (H) 09/16/2019   BUN 20 09/16/2019   NA 135 09/16/2019   K 4.2 09/16/2019   CL 99 09/16/2019   CO2 26 09/16/2019   Lab Results  Component Value Date   ALT 21 05/02/2019   AST 19 05/02/2019   ALKPHOS 94 05/02/2019   BILITOT 0.9 05/02/2019   Lab Results  Component Value Date   CHOL 152 05/02/2019   HDL 27 (L) 05/02/2019   LDLCALC 79 05/02/2019   TRIG 278 (H) 05/02/2019   CHOLHDL 5.6 (H) 05/02/2019    Lab  Results  Component Value Date   HGBA1C 6.1 (A) 10/17/2018    Assessment & Plan    1.  CAD: S/p CABG x4 in 04/2015. He has done well over the past year and remains active without symptoms or limitations. He does report rare fleeting "twinges" in his chest, that occur approximately 1-2x/month, and last only a few seconds before resolving spontaneously. His symptoms do not seem cardiac in nature. 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 He is also due for a repeat lipid panel/LFTs. He remains on aspirin, statin, and ACE inhibitor therapy. Will schedule ETT to be completed prior to his November 4th deadline, and arrange for repeat lipids, LFTS to be completed at that time.   2. Essential hypertension: Stable on ACE inhibitor therapy. Continue same.   3. Hyperlipidemia: LDL of 79 in September 2020, TG 279. Currently on high potency statin and Vascepa 1 g daily. Repeat lipids, LFTs.   4. Disposition: Schedule ETT prior to November 4th. Repeat lipid panel, LFTs. Follow-up in 1 year.    Murray Hodgkins, NP 05/27/2020, 7:34 AM

## 2020-05-27 NOTE — Patient Instructions (Signed)
Medication Instructions:  Your physician recommends that you continue on your current medications as directed. Please refer to the Current Medication list given to you today.  *If you need a refill on your cardiac medications before your next appointment, please call your pharmacy*   Lab Work: Your physician recommends that you return for lab work in: at your convenience for Lipid and liver profiles.  - You will need to be fasting. Please do not have anything to eat or drink after midnight the morning you have the lab work. You may only have water or black coffee with no cream or sugar. - Please go to the [facility]. You will check in at the front desk to the right as you walk into the atrium. Valet Parking is offered if needed. - No appointment needed. You may go any day between 7 am and 6 pm.  If you have labs (blood work) drawn today and your tests are completely normal, you will receive your results only by: Marland Kitchen MyChart Message (if you have MyChart) OR . A paper copy in the mail If you have any lab test that is abnormal or we need to change your treatment, we will call you to review the results.   Testing/Procedures:  Needs sometime next Thursday (morning) or Friday. Your physician has requested that you have an exercise tolerance test. For further information please visit [email or web address]. Please also follow instruction sheet, as given.   DO NOT drink or eat foods with caffeine for 24 hours before the test. (Chocolate, coffee, tea, decaf coffee/tea, or energy drinks)  DO NOT smoke for 4 hours before your test.  If you use an inhaler, bring it with you to the test.  Wear comfortable shoes and clothing.   Follow-Up: At [facility], you and your health needs are our priority.  As part of our continuing mission to provide you with exceptional heart care, we have created designated Provider Care Teams.  These Care Teams include your primary Cardiologist (physician) and  Advanced Practice Providers (APPs -  Physician Assistants and Nurse Practitioners) who all work together to provide you with the care you need, when you need it.  We recommend signing up for the patient portal called "MyChart".  Sign up information is provided on this After Visit Summary.  MyChart is used to connect with patients for Virtual Visits (Telemedicine).  Patients are able to view lab/test results, encounter notes, upcoming appointments, etc.  Non-urgent messages can be sent to your provider as well.   To learn more about what you can do with MyChart, go to [email or web address].    Your next appointment:   12 month(s)  The format for your next appointment:   In Person  Provider:   You may see Kathlyn Sacramento, MD or one of the following Advanced Practice Providers on your designated Care Team:    Murray Hodgkins, NP  Christell Faith, PA-C  Marrianne Mood, PA-C  Cadence [place], [place]

## 2020-05-28 ENCOUNTER — Other Ambulatory Visit
Admission: RE | Admit: 2020-05-28 | Discharge: 2020-05-28 | Disposition: A | Discharge status: Home / Self Care | Payer: PPO | Attending: Nurse Practitioner | Admitting: Nurse Practitioner

## 2020-05-28 ENCOUNTER — Encounter: Payer: Self-pay | Admitting: Urology

## 2020-05-28 ENCOUNTER — Ambulatory Visit: Payer: PPO | Admitting: Urology

## 2020-05-28 VITALS — BP 152/78 | HR 72 | Ht 66.0 in | Wt 197.0 lb

## 2020-05-28 DIAGNOSIS — E785 Hyperlipidemia, unspecified: Secondary | ICD-10-CM | POA: Insufficient documentation

## 2020-05-28 DIAGNOSIS — N200 Calculus of kidney: Secondary | ICD-10-CM

## 2020-05-28 DIAGNOSIS — I25810 Atherosclerosis of coronary artery bypass graft(s) without angina pectoris: Secondary | ICD-10-CM | POA: Insufficient documentation

## 2020-05-28 LAB — HEPATIC FUNCTION PANEL
ALT: 22 U/L (ref 0–44)
AST: 20 U/L (ref 15–41)
Albumin: 3.7 g/dL (ref 3.5–5.0)
Alkaline Phosphatase: 87 U/L (ref 38–126)
Bilirubin, Direct: 0.1 mg/dL (ref 0.0–0.2)
Indirect Bilirubin: 1.1 mg/dL — ABNORMAL HIGH (ref 0.3–0.9)
Total Bilirubin: 1.2 mg/dL (ref 0.3–1.2)
Total Protein: 6.5 g/dL (ref 6.5–8.1)

## 2020-05-28 LAB — LIPID PANEL
Cholesterol: 132 mg/dL (ref 0–200)
HDL: 26 mg/dL — ABNORMAL LOW (ref >40)
LDL Cholesterol: 58 mg/dL (ref 0–99)
Total CHOL/HDL Ratio: 5.1 RATIO
Triglycerides: 240 mg/dL — ABNORMAL HIGH (ref <150)
VLDL: 48 mg/dL — ABNORMAL HIGH (ref 0–40)

## 2020-06-03 ENCOUNTER — Other Ambulatory Visit
Admission: RE | Admit: 2020-06-03 | Discharge: 2020-06-03 | Disposition: A | Discharge status: Home / Self Care | Payer: PPO | Source: Ambulatory Visit | Attending: Cardiovascular Disease | Admitting: Cardiovascular Disease

## 2020-06-03 ENCOUNTER — Other Ambulatory Visit: Payer: Self-pay

## 2020-06-03 DIAGNOSIS — Z01812 Encounter for preprocedural laboratory examination: Secondary | ICD-10-CM | POA: Diagnosis not present

## 2020-06-03 DIAGNOSIS — Z20822 Contact with and (suspected) exposure to covid-19: Secondary | ICD-10-CM | POA: Insufficient documentation

## 2020-06-04 ENCOUNTER — Ambulatory Visit (INDEPENDENT_AMBULATORY_CARE_PROVIDER_SITE_OTHER): Payer: PPO

## 2020-06-04 DIAGNOSIS — I25810 Atherosclerosis of coronary artery bypass graft(s) without angina pectoris: Secondary | ICD-10-CM | POA: Diagnosis not present

## 2020-06-04 DIAGNOSIS — Z0289 Encounter for other administrative examinations: Secondary | ICD-10-CM

## 2020-06-04 LAB — SARS CORONAVIRUS 2 (TAT 6-24 HRS): SARS Coronavirus 2: NEGATIVE

## 2020-06-09 ENCOUNTER — Encounter: Payer: Self-pay | Admitting: Physician Assistant

## 2020-06-09 ENCOUNTER — Telehealth: Payer: Self-pay | Admitting: Cardiovascular Disease

## 2020-06-09 LAB — EXERCISE TOLERANCE TEST
Estimated workload: 10.4 METS
Exercise duration (min): 9 min
Exercise duration (sec): 12 s
MPHR: 150 {beats}/min
Peak HR: 139 {beats}/min
Percent HR: 92 %
RPE: 15
Rest HR: 75 {beats}/min

## 2020-06-09 NOTE — Telephone Encounter (Signed)
Patient walked in to the office requesting his DOT clearnece be expedited.  ETT read by Dr. Garen Lah. DOT clearance letter drafted by Christell Faith, PA.  Patient was handed the letter and was very appreciative for the assistance.  No further action required. Closing this encounter.

## 2020-06-09 NOTE — Telephone Encounter (Signed)
Patient would like to be called with any updates on his cell phone

## 2020-06-09 NOTE — Telephone Encounter (Signed)
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

## 2020-06-09 NOTE — Telephone Encounter (Signed)
Patient did have his DOT ETT on 06/04/20. There was an issue with routing the results to the provider. The results are now available for Dr. Fletcher Anon to review. Will fwd the msg to Dr. Fletcher Anon.

## 2020-08-17 ENCOUNTER — Other Ambulatory Visit: Payer: Self-pay | Admitting: Family Medicine

## 2020-08-27 ENCOUNTER — Ambulatory Visit: Payer: PPO | Admitting: Urology

## 2020-08-27 ENCOUNTER — Ambulatory Visit
Admission: RE | Admit: 2020-08-27 | Discharge: 2020-08-27 | Disposition: A | Discharge status: Home / Self Care | Payer: PPO | Source: Ambulatory Visit | Attending: Urology | Admitting: Urology

## 2020-08-27 ENCOUNTER — Encounter: Payer: Self-pay | Admitting: Urology

## 2020-08-27 ENCOUNTER — Other Ambulatory Visit: Payer: Self-pay

## 2020-08-27 VITALS — BP 123/76 | HR 76 | Ht 66.0 in | Wt 200.0 lb

## 2020-08-27 DIAGNOSIS — N401 Enlarged prostate with lower urinary tract symptoms: Secondary | ICD-10-CM | POA: Diagnosis not present

## 2020-08-27 DIAGNOSIS — N200 Calculus of kidney: Secondary | ICD-10-CM

## 2020-08-27 NOTE — Progress Notes (Signed)
08/27/2020 10:11 AM   [name] 03/02/1951 [id number]  Referring provider: Birdie Sons, MD [address] [place],  [place] [address]  Chief Complaint  Patient presents with  . Nephrolithiasis    Urologic history: 1. Nephrolithiasis -Left ureteroscopic stone removal 10/07/2019 -Stone analysis mixed calcium oxalate -Right nephrolithiasis  HPI: 70 y.o. male presents for follow-up of nephrolithiasis.   Since last visit denies flank, abdominal, pelvic pain  No dysuria or gross hematuria  Does note urinary frequency, urgency and occasional episodes urge incontinence when bladder is over full   PMH: Past Medical History:  Diagnosis Date  . CAD (coronary artery disease)    a. CT scan 01/02/2015: extensive coronary atherosclerosis;  b. 04/2015 Inf STEMI/Cath/CABG x 4: LIMA->LAD, VG->Diag, VG->OM2, VG->RPL; c. 09/2015 MV: EF 55-65%, infsept/inf infarct w/o ischemia->Low Risk.  . Colon polyps   . Complete heart block ([place])    a. 04/2015 in setting of inferior STEMI-->resolved.  . Difficult intubation   . Hyperlipidemia   . Hypertension   . Ischemic cardiomyopathy    a. 04/2015 LV gram: EF 35-40%; b. 09/2015 MV: EF 55-65%.  . Kidney stones    a. May 2016.  . MI (myocardial infarction) ([place]) 04/2015  . Postoperative atrial fibrillation ([place])    a. 04/2015 Post-op CABG AF/AFlutter--> converted with rapid atrial pacing and amio; b. CHA2DS2VASc = 3->Coumadin.  . 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 CARDIAC CATHETERIZATION N/A 05/03/2015   Procedure: Left Heart Cath and Coronary Angiography;  Surgeon: Wellington Hampshire, MD;  Location: [facility];  Service: Cardiovascular;  Laterality: N/A;  . CATARACT EXTRACTION, [place]  . CORONARY ARTERY BYPASS GRAFT N/A 05/03/2015   Procedure: CORONARY ARTERY BYPASS GRAFTING (CABG) times four using left internal mammary artery and  right saphenous vein.;  Surgeon: [name], MD;  Location: [facility];  Service: Open Heart Surgery;  Laterality: N/A;  . CYSTOSCOPY WITH STENT PLACEMENT Left 09/16/2019   Procedure: CYSTOSCOPY WITH STENT PLACEMENT;  Surgeon: Abbie Sons, MD;  Location: [facility];  Service: Urology;  Laterality: Left;  . CYSTOSCOPY/RETROGRADE/URETEROSCOPY Left 09/16/2019   Procedure: CYSTOSCOPY/RETROGRADE/URETEROSCOPY;  Surgeon: Abbie Sons, MD;  Location: [facility];  Service: Urology;  Laterality: Left;  . CYSTOSCOPY/URETEROSCOPY/HOLMIUM LASER/STENT PLACEMENT Left 10/07/2019   Procedure: CYSTOSCOPY/URETEROSCOPY/HOLMIUM LASER/STENT Exhange;  Surgeon: Abbie Sons, MD;  Location: [facility];  Service: Urology;  Laterality: Left;  . HERNIA REPAIR     ventral hernia repair  . JOINT REPLACEMENT    . KNEE SURGERY Left    arthroscopic knee surgery  . NECK SURGERY     Spinal fusion- neck. Dr. Sherley Bounds of [facility])  . right hip replacement Right 02/04/2014   Dr. Sabra Heck  . TEE WITHOUT CARDIOVERSION N/A 05/03/2015   Procedure: TRANSESOPHAGEAL ECHOCARDIOGRAM (TEE);  Surgeon: [name], MD;  Location: [facility];  Service: Open Heart Surgery;  Laterality: N/A;    Home Medications:  Allergies as of 08/27/2020      Reactions   Lovastatin Other (See Comments)   Chest pain   Pravastatin Other (See Comments)   Nose bleed      Medication List       Accurate as of August 27, 2020 10:11 AM. If you have any questions, ask your nurse or doctor.        aspirin 81 MG EC tablet  Take 1 tablet (81 mg total) by mouth daily.   atorvastatin 80 MG tablet Commonly known as: LIPITOR TAKE ONE TABLET BY MOUTH ONCE DAILY AT 6PM   Ensure Plus Liqd Take 237 mLs by mouth daily.   icosapent Ethyl 1 g capsule Commonly known as: VASCEPA Take 2 g by mouth daily. What changed: Another medication with the same name was changed. Make sure you understand how and when to take each.    icosapent Ethyl 1 g capsule Commonly known as: VASCEPA Take 1 capsule (1 gram) by mouth twice daily What changed:   how much to take  how to take this  when to take this  additional instructions   lisinopril 10 MG tablet Commonly known as: ZESTRIL TAKE 1 TABLET BY MOUTH ONCE DAILY PATIENT  NEEDS  TO  SCHEDULE  OFFICE  VISIT  FOR  FOLLOW  UP   multivitamin tablet Take 1 tablet by mouth every evening.       Allergies:  Allergies  Allergen Reactions  . Lovastatin Other (See Comments)    Chest pain  . Pravastatin Other (See Comments)    Nose bleed    Family History: Family History  Problem Relation Age of Onset  . CAD Father   . Heart attack Father   . Heart Problems Sister        CABG  . Heart attack Brother   . Heart attack Brother   . Heart attack Brother   . Heart attack Brother   . Heart attack Sister   . CAD Maternal Uncle   . CAD Paternal Uncle     Social History:  reports that he has never smoked. He has never used smokeless tobacco. He reports previous alcohol use. He reports that he does not use drugs.   Physical Exam: BP 123/76   Pulse 76   Ht 5\' 6"  (1.676 m)   Wt 200 lb (90.7 kg)   BMI 32.28 kg/m   Constitutional:  Alert and oriented, No acute distress. HEENT: [place] AT, moist mucus membranes.  Trachea midline, no masses. Cardiovascular: No clubbing, cyanosis, or edema. Respiratory: Normal respiratory effort, no increased work of breathing. Skin: No rashes, bruises or suspicious lesions. Neurologic: Grossly intact, no focal deficits, moving all 4 extremities. Psychiatric: Normal mood and affect.   Pertinent Imaging:  KUB performed today was personally reviewed and interpreted.  Small, stable right upper pole calcification   Assessment & Plan:    1.  Nephrolithiasis  Stable right renal calculus  Follow-up 2 years with KUB  2. BPH with LUTS  States his symptoms are not bothersome enough that he desires a trial of medical  management   Abbie Sons, MD  [place] [address] [place], [place] [address] [phone]

## 2020-09-23 ENCOUNTER — Telehealth: Payer: Self-pay | Admitting: *Deleted

## 2020-09-23 NOTE — Telephone Encounter (Signed)
Need to contact patient to get clarification as to how he is currently taking this medication. Received faxed request for Vascepa 1 gm with directions stating take 2 capsules by mouth BID.

## 2020-09-24 MED ORDER — ICOSAPENT ETHYL 1 G PO CAPS: 1.0000 g | ORAL_CAPSULE | Freq: Every day | ORAL | 2 refills | Status: DC

## 2020-09-24 NOTE — Telephone Encounter (Signed)
Called and spoke with patient, per patient he is taking the Vascepa once a day. Rx sent to [facility] on file per per patient's request.

## 2020-09-28 NOTE — Telephone Encounter (Addendum)
Patient made aware of Ignacia Bayley, NP response and recommendation. He will try the OTC fish oil.

## 2020-09-28 NOTE — Telephone Encounter (Signed)
Patients wife calling in stating with new Rx patient is upset at the cost of the new way the script has been written. Wife was made aware that his old script was giving him double the amount of medication he was truly taking and that's why the medication was lasting longer and seeming cheaper. Wife would like to know if there is an alternative to this medication that may be cheaper  Please advise

## 2020-09-28 NOTE — Telephone Encounter (Signed)
Spoke with the patient. Patient sts that Vascepa would cost > $80 and he is unable to afford it. Advised the patient that I do not think there is a comparable medication to Vascepa.  Adv the patient that I will fwd the update to Ignacia Bayley, NP for his recommendation.

## 2020-09-28 NOTE — Telephone Encounter (Signed)
Thank you Lattie Haw.  You are correct re: comparable medicine.  Otc Fish Oil (omega 3 acid ethyl esters) has not been found to be as effective as Vascepa in reducing cardiac events in pts with heart disease on statin therapy, but have traditionally been used for treatment of high triglycerides.  If vascepa is cost prohibitive, he is welcome to change to Fish Oil 4g daily, but the data is not as strong for its protective benefits.

## 2020-09-28 NOTE — Addendum Note (Signed)
Addended by: Lamar Laundry on: 09/28/2020 05:13 PM   Modules accepted: Orders

## 2020-11-12 ENCOUNTER — Ambulatory Visit (INDEPENDENT_AMBULATORY_CARE_PROVIDER_SITE_OTHER): Payer: PPO | Admitting: Family Medicine

## 2020-11-12 ENCOUNTER — Other Ambulatory Visit: Payer: Self-pay

## 2020-11-12 ENCOUNTER — Encounter: Payer: Self-pay | Admitting: Family Medicine

## 2020-11-12 VITALS — BP 138/70 | HR 61 | Temp 97.5°F | Resp 16 | Ht 66.0 in | Wt 198.0 lb

## 2020-11-12 DIAGNOSIS — Z1211 Encounter for screening for malignant neoplasm of colon: Secondary | ICD-10-CM

## 2020-11-12 DIAGNOSIS — Z125 Encounter for screening for malignant neoplasm of prostate: Secondary | ICD-10-CM | POA: Diagnosis not present

## 2020-11-12 DIAGNOSIS — E785 Hyperlipidemia, unspecified: Secondary | ICD-10-CM

## 2020-11-12 DIAGNOSIS — Z289 Immunization not carried out for unspecified reason: Secondary | ICD-10-CM

## 2020-11-12 DIAGNOSIS — I2510 Atherosclerotic heart disease of native coronary artery without angina pectoris: Secondary | ICD-10-CM | POA: Diagnosis not present

## 2020-11-12 DIAGNOSIS — I10 Essential (primary) hypertension: Secondary | ICD-10-CM

## 2020-11-12 MED ORDER — SHINGRIX 50 MCG/0.5ML IM SUSR: 0.5000 mL | Freq: Once | INTRAMUSCULAR | 0 refills | Status: AC

## 2020-11-12 NOTE — Progress Notes (Signed)
Established patient visit   Patient: [name]   DOB: [birth year]   70 y.o. Male  MRN: [id number] Visit Date: 11/12/2020  Today's healthcare provider: [name], MD   Chief Complaint  Patient presents with  . Hypertension  . Hyperlipidemia   Subjective    HPI  Hypertension, follow-up  BP Readings from Last 3 Encounters:  11/12/20 (!) 149/77  08/27/20 123/76  05/28/20 (!) 152/78   Wt Readings from Last 3 Encounters:  11/12/20 198 lb (89.8 kg)  08/27/20 200 lb (90.7 kg)  05/28/20 197 lb (89.4 kg)     He was last seen for hypertension 2 years ago.  BP at that visit was 110/58. Management since that visit includes no changes.  He reports good compliance with treatment. He is not having side effects.  He is following a Regular diet. He is not exercising. He does not smoke.  Use of agents associated with hypertension: NSAIDS.   Outside blood pressures are not checked. Symptoms: No chest pain No chest pressure  Yes palpitations (s/p Cardiology work up- unchanged from baseline). No syncope  No dyspnea No orthopnea  No paroxysmal nocturnal dyspnea No lower extremity edema   Pertinent labs: Lab Results  Component Value Date   CHOL 132 05/28/2020   HDL 26 (L) 05/28/2020   LDLCALC 58 05/28/2020   TRIG 240 (H) 05/28/2020   CHOLHDL 5.1 05/28/2020   Lab Results  Component Value Date   NA 135 09/16/2019   K 4.2 09/16/2019   CREATININE 1.46 (H) 09/16/2019   GFRNONAA 48 (L) 09/16/2019   GFRAA 56 (L) 09/16/2019   GLUCOSE 145 (H) 09/16/2019     The 10-year ASCVD risk score Mikey Bussing DC Jr., et al., 2013) is: 30.3%   --------------------------------------------------------------------------------------------------- Lipid/Cholesterol, Follow-up  Last lipid panel Other pertinent labs  Lab Results  Component Value Date   CHOL 132 05/28/2020   HDL 26 (L) 05/28/2020   LDLCALC 58 05/28/2020   TRIG 240 (H) 05/28/2020   CHOLHDL 5.1 05/28/2020   Lab Results   Component Value Date   ALT 22 05/28/2020   AST 20 05/28/2020   PLT 201 09/16/2019   TSH 1.27 12/31/2014     He was last seen for this 1 years ago.  Management since that visit includes check labs, continue current medications.  He reports good compliance with treatment. He is not having side effects.   Symptoms: No chest pain No chest pressure/discomfort  No dyspnea No lower extremity edema  No numbness or tingling of extremity No orthopnea  No palpitations No paroxysmal nocturnal dyspnea  No speech difficulty No syncope    The 10-year ASCVD risk score Mikey Bussing DC Jr., et al., 2013) is: 30.3%   Allergies  Allergen Reactions  . Lovastatin Other (See Comments)    Chest pain  . Pravastatin Other (See Comments)    Nose bleed       Medications: Outpatient Medications Prior to Visit  Medication Sig  . aspirin 81 MG EC tablet Take 1 tablet (81 mg total) by mouth daily.  Marland Kitchen atorvastatin (LIPITOR) 80 MG tablet TAKE ONE TABLET BY MOUTH ONCE DAILY AT 6PM  . ENSURE PLUS (ENSURE PLUS) LIQD Take 237 mLs by mouth daily.  Marland Kitchen lisinopril (ZESTRIL) 10 MG tablet TAKE 1 TABLET BY MOUTH ONCE DAILY PATIENT  NEEDS  TO  SCHEDULE  OFFICE  VISIT  FOR  FOLLOW  UP  . Multiple Vitamin (MULTIVITAMIN) tablet Take 1 tablet by mouth every evening.   Marland Kitchen  Omega-3 Fatty Acids (FISH OIL) 1000 MG CAPS Take 4,000 mg by mouth.   No facility-administered medications prior to visit.    Review of Systems  Constitutional: Negative for appetite change, chills and fever.  Respiratory: Negative for chest tightness, shortness of breath and wheezing.   Cardiovascular: Positive for palpitations (s/p Cardiology work up- unchanged from baseline.. Negative for chest pain.  Gastrointestinal: Negative for abdominal pain, nausea and vomiting.    Last thyroid functions Lab Results  Component Value Date   TSH 1.27 12/31/2014       Objective    BP 138/70   Pulse 61   Temp (!) 97.5 F (36.4 C) (Temporal)   Resp 16    Ht 5\' 6"  (1.676 m)   Wt 198 lb (89.8 kg)   BMI 31.96 kg/m    Physical Exam   General: Appearance:    Overweight male in no acute distress  Eyes:    PERRL, conjunctiva/corneas clear, EOM's intact       Lungs:     Clear to auscultation bilaterally, respirations unlabored  Heart:    Normal heart rate. Normal rhythm. No murmurs, rubs, or gallops.   MS:   All extremities are intact.   Neurologic:   Awake, alert, oriented x 3. No apparent focal neurological           defect.          Assessment & Plan     1. Hyperlipidemia, unspecified hyperlipidemia type He is tolerating atorvastatin well with no adverse effects.   - Comprehensive metabolic panel - Lipid panel  2. Primary hypertension Relatively well controlled. Continue current medications.   - CBC - TSH  3. Coronary artery disease involving native coronary artery of native heart without angina pectoris Asymptomatic. Compliant with medication.  Continue aggressive risk factor modification. . Continue routine follow up with cardiology.   4. Prostate cancer screening  - PSA Total (Reflex To Free) ([facility] only)  5. Prescription for Shingrix. Vaccine not administered in office.   - Zoster Vaccine Adjuvanted [facility]) injection; Inject 0.5 mLs into the muscle once for 1 dose.  Dispense: 0.5 mL; Refill: 0  6. Colon cancer screening Previous patient of Dr. Donnella Sham but no screening since polyp removed in 2012.  - Ambulatory referral to gastroenterology for colonoscopy        The entirety of the information documented in the History of Present Illness, Review of Systems and Physical Exam were personally obtained by me. Portions of this information were initially documented by the CMA and reviewed by me for thoroughness and accuracy.      [name], MD  [facility] [phone] (phone) [phone] (fax)  [place]

## 2020-11-12 NOTE — Patient Instructions (Signed)

## 2020-11-13 LAB — COMPREHENSIVE METABOLIC PANEL
ALT: 23 IU/L (ref 0–44)
AST: 22 IU/L (ref 0–40)
Albumin/Globulin Ratio: 2.1 (ref 1.2–2.2)
Albumin: 4.4 g/dL (ref 3.7–4.7)
Alkaline Phosphatase: 118 IU/L (ref 44–121)
BUN/Creatinine Ratio: 15 (ref 10–24)
BUN: 16 mg/dL (ref 8–27)
Bilirubin Total: 1 mg/dL (ref 0.0–1.2)
CO2: 23 mmol/L (ref 20–29)
Calcium: 9.7 mg/dL (ref 8.6–10.2)
Chloride: 101 mmol/L (ref 96–106)
Creatinine, Ser: 1.07 mg/dL (ref 0.76–1.27)
Globulin, Total: 2.1 g/dL (ref 1.5–4.5)
Glucose: 113 mg/dL — ABNORMAL HIGH (ref 65–99)
Potassium: 5 mmol/L (ref 3.5–5.2)
Sodium: 138 mmol/L (ref 134–144)
Total Protein: 6.5 g/dL (ref 6.0–8.5)
eGFR: 74 mL/min/{1.73_m2} (ref >59)

## 2020-11-13 LAB — LIPID PANEL
Chol/HDL Ratio: 4.9 ratio (ref 0.0–5.0)
Cholesterol, Total: 137 mg/dL (ref 100–199)
HDL: 28 mg/dL — ABNORMAL LOW (ref >39)
LDL Chol Calc (NIH): 73 mg/dL (ref 0–99)
Triglycerides: 213 mg/dL — ABNORMAL HIGH (ref 0–149)
VLDL Cholesterol Cal: 36 mg/dL (ref 5–40)

## 2020-11-13 LAB — CBC
Hematocrit: 43.5 % (ref 37.5–51.0)
Hemoglobin: 14.7 g/dL (ref 13.0–17.7)
MCH: 27.4 pg (ref 26.6–33.0)
MCHC: 33.8 g/dL (ref 31.5–35.7)
MCV: 81 fL (ref 79–97)
Platelets: 210 10*3/uL (ref 150–450)
RBC: 5.37 x10E6/uL (ref 4.14–5.80)
RDW: 14.7 % (ref 11.6–15.4)
WBC: 7.1 10*3/uL (ref 3.4–10.8)

## 2020-11-13 LAB — PSA TOTAL (REFLEX TO FREE): Prostate Specific Ag, Serum: 0.8 ng/mL (ref 0.0–4.0)

## 2020-11-13 LAB — TSH: TSH: 1.1 u[IU]/mL (ref 0.450–4.500)

## 2020-11-15 ENCOUNTER — Other Ambulatory Visit: Payer: Self-pay

## 2020-11-15 DIAGNOSIS — I10 Essential (primary) hypertension: Secondary | ICD-10-CM

## 2020-11-15 NOTE — Telephone Encounter (Signed)
LMTCB 11/15/2020.  PEC please advise pt of lab results when he calls back.   Thanks,   -Mickel Baas

## 2020-11-15 NOTE — Telephone Encounter (Signed)
-----   Message from Birdie Sons, MD sent at 11/15/2020  7:47 AM EDT ----- Labs all good. Psa normal. Cholesterol well controlled at 137. Continue current medications.  Check yearly.

## 2020-11-16 MED ORDER — LISINOPRIL 10 MG PO TABS: ORAL_TABLET | ORAL | 4 refills | Status: DC

## 2020-11-16 NOTE — Telephone Encounter (Signed)
Patient's wife [name] advised and verbalized understanding (OK per DPR). She wants to know if he needs to continue taking Lisinoril? If so, he needs a refill for a 90 day supply sent into [address].   Dianne also wants to know when does patient need to come back in for a follow up appointment?

## 2020-12-09 ENCOUNTER — Telehealth: Payer: PPO

## 2020-12-09 ENCOUNTER — Telehealth: Payer: Self-pay

## 2020-12-09 NOTE — Telephone Encounter (Signed)
Patient was contacted to schedule his colonoscopy this morning.  He stated that its not a good time for him to schedule this.  I told him I understood and he may call the office back when he ready to schedule.  Thanks,  Sharyn Lull

## 2021-01-10 ENCOUNTER — Other Ambulatory Visit: Payer: Self-pay

## 2021-01-10 MED ORDER — ATORVASTATIN CALCIUM 80 MG PO TABS: ORAL_TABLET | ORAL | 0 refills | Status: DC

## 2021-04-03 ENCOUNTER — Other Ambulatory Visit: Payer: Self-pay | Admitting: Nurse Practitioner

## 2021-05-19 ENCOUNTER — Other Ambulatory Visit: Payer: Self-pay

## 2021-05-19 ENCOUNTER — Ambulatory Visit: Payer: PPO | Admitting: Cardiovascular Disease

## 2021-05-19 ENCOUNTER — Encounter: Payer: Self-pay | Admitting: Cardiovascular Disease

## 2021-05-19 VITALS — BP 148/70 | HR 68 | Ht 66.0 in | Wt 201.2 lb

## 2021-05-19 DIAGNOSIS — E782 Mixed hyperlipidemia: Secondary | ICD-10-CM | POA: Diagnosis not present

## 2021-05-19 DIAGNOSIS — I2510 Atherosclerotic heart disease of native coronary artery without angina pectoris: Secondary | ICD-10-CM

## 2021-05-19 DIAGNOSIS — I10 Essential (primary) hypertension: Secondary | ICD-10-CM

## 2021-05-19 DIAGNOSIS — Z951 Presence of aortocoronary bypass graft: Secondary | ICD-10-CM

## 2021-05-19 MED ORDER — LISINOPRIL 20 MG PO TABS: ORAL_TABLET | ORAL | 3 refills | Status: DC

## 2021-05-19 MED ORDER — ATORVASTATIN CALCIUM 40 MG PO TABS: 40.0000 mg | ORAL_TABLET | Freq: Every day | ORAL | 3 refills | Status: DC

## 2021-05-19 NOTE — Progress Notes (Signed)
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 Meds reviewed verbally with pt.      History of Present Illness: [name] is a 70 y.o. male who presents for a follow-up visit regarding coronary artery disease status post CABG September 2016. He presented then with inferior ST elevation myocardial infarction. Cardiac catheterization showed severe three-vessel coronary artery disease. The culprit was an occluded distal RCA with heavy thrombus burden. Ejection fraction was 35-40%. Aspiration thrombectomy and balloon angioplasty was performed with restoration of TIMI-3 flow. However, the results were overall suboptimal and thus the patient underwent urgent CABG by Dr. Roxan Hockey.  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  He is known to have normal ejection fraction.   Most recent treadmill stress test in October 2021 was negative for ischemia. He has been doing well and denies any chest pain, shortness of breath or palpitations.  He takes his medications regularly.  Past Medical History:  Diagnosis Date   CAD (coronary artery disease)    a. CT scan 01/02/2015: extensive coronary atherosclerosis;  b. 04/2015 Inf STEMI/Cath/CABG x 4: LIMA->LAD, VG->Diag, VG->OM2, VG->RPL; c. 09/2015 MV: EF 55-65%, infsept/inf infarct w/o ischemia->Low Risk.   Colon polyps    Complete heart block ([place])    a. 04/2015 in setting of inferior STEMI-->resolved.   Difficult intubation    Hyperlipidemia    Hypertension    Ischemic cardiomyopathy    a. 04/2015 LV gram: EF 35-40%; b. 09/2015 MV: EF 55-65%.   Kidney stones    a. May 2016.   MI (myocardial infarction) ([place]) 04/2015   Postoperative atrial fibrillation ([place])    a. 04/2015 Post-op CABG AF/AFlutter--> converted with  rapid atrial pacing and amio; b. CHA2DS2VASc = 3->Coumadin.   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    Past Surgical History:  Procedure Laterality Date   CARDIAC CATHETERIZATION N/A 05/03/2015   Procedure: Left Heart Cath and Coronary Angiography;  Surgeon: Wellington Hampshire, MD;  Location: [facility];  Service: Cardiovascular;  Laterality: N/A;   CATARACT EXTRACTION, [place]   CORONARY ARTERY BYPASS GRAFT N/A 05/03/2015   Procedure: CORONARY ARTERY BYPASS GRAFTING (CABG) times four using left internal mammary artery and right saphenous vein.;  Surgeon: [name], MD;  Location: [facility];  Service: Open Heart Surgery;  Laterality: N/A;   CYSTOSCOPY WITH STENT PLACEMENT Left 09/16/2019   Procedure: CYSTOSCOPY WITH STENT PLACEMENT;  Surgeon: Abbie Sons, MD;  Location: [facility];  Service: Urology;  Laterality: Left;   CYSTOSCOPY/RETROGRADE/URETEROSCOPY Left 09/16/2019   Procedure: CYSTOSCOPY/RETROGRADE/URETEROSCOPY;  Surgeon: Abbie Sons, MD;  Location: [facility];  Service: Urology;  Laterality: Left;   CYSTOSCOPY/URETEROSCOPY/HOLMIUM LASER/STENT PLACEMENT Left 10/07/2019   Procedure: CYSTOSCOPY/URETEROSCOPY/HOLMIUM LASER/STENT Exhange;  Surgeon: Abbie Sons, MD;  Location: [facility];  Service: Urology;  Laterality: Left;   HERNIA REPAIR     ventral hernia repair   JOINT REPLACEMENT     KNEE SURGERY Left    arthroscopic knee surgery   NECK SURGERY     Spinal fusion- neck. Dr. Sherley Bounds of [facility])   right hip replacement Right 02/04/2014   Dr. Sabra Heck  TEE WITHOUT CARDIOVERSION N/A 05/03/2015   Procedure: TRANSESOPHAGEAL ECHOCARDIOGRAM (TEE);  Surgeon: [name], MD;  Location: [facility];  Service: Open Heart Surgery;  Laterality: N/A;     Current Outpatient Medications  Medication Sig Dispense Refill   aspirin 81 MG EC tablet Take 1 tablet (81 mg total) by mouth daily.     atorvastatin (LIPITOR) 80 MG tablet Take 80 mg  by mouth daily.     ENSURE PLUS (ENSURE PLUS) LIQD Take 237 mLs by mouth daily.     lisinopril (ZESTRIL) 10 MG tablet TAKE 1 TABLET BY MOUTH ONCE DAILY PATIENT 90 tablet 4   Multiple Vitamin (MULTIVITAMIN) tablet Take 1 tablet by mouth every evening.      Omega-3 Fatty Acids (FISH OIL) 1000 MG CAPS Take 4,000 mg by mouth.     No current facility-administered medications for this visit.    Allergies:   Lovastatin and Pravastatin    Social History:  The patient  reports that he has never smoked. He has never used smokeless tobacco. He reports that he does not currently use alcohol. He reports that he does not use drugs.   Family History:  The patient's family history includes CAD in his father, maternal uncle, and paternal uncle; Heart Problems in his sister; Heart attack in his brother, brother, brother, brother, father, and sister.    ROS:  Please see the history of present illness.   Otherwise, review of systems are positive for none.   All other systems are reviewed and negative.    PHYSICAL EXAM: VS:  BP (!) 148/70 (BP Location: Left Arm, Patient Position: Sitting, Cuff Size: Normal)   Pulse 68   Ht 5\' 6"  (1.676 m)   Wt 201 lb 4 oz (91.3 kg)   SpO2 97%   BMI 32.48 kg/m  , BMI Body mass index is 32.48 kg/m. GEN: Well nourished, well developed, in no acute distress  HEENT: normal  Neck: no JVD, carotid bruits, or masses Cardiac: RRR; no murmurs, rubs, or gallops,no edema  Respiratory:  clear to auscultation bilaterally, normal work of breathing GI: soft, nontender, nondistended, + BS MS: no deformity or atrophy  Skin: warm and dry, no rash Neuro:  Strength and sensation are intact Psych: euthymic mood, full affect   EKG:  EKG is ordered today. The ekg ordered today demonstrates sinus rhythm with first-degree AV block and possible old inferior infarct.   Recent Labs: 11/12/2020: ALT 23; BUN 16; Creatinine, Ser 1.07; Hemoglobin 14.7; Platelets 210; Potassium 5.0; Sodium  138; TSH 1.100    Lipid Panel    Component Value Date/Time   CHOL 137 11/12/2020 1103   TRIG 213 (H) 11/12/2020 1103   HDL 28 (L) 11/12/2020 1103   CHOLHDL 4.9 11/12/2020 1103   CHOLHDL 5.1 05/28/2020 1011   VLDL 48 (H) 05/28/2020 1011   LDLCALC 73 11/12/2020 1103      Wt Readings from Last 3 Encounters:  05/19/21 201 lb 4 oz (91.3 kg)  11/12/20 198 lb (89.8 kg)  08/27/20 200 lb (90.7 kg)      No flowsheet data found.    ASSESSMENT AND PLAN:  1.  Coronary artery disease involving native coronary arteries without angina: He is doing extremely well with no anginal symptoms. Continue medical therapy.  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  2.  Mixed hyperlipidemia: I reviewed most recent lipid profile done in April which showed an LDL of 73 which is close to target  and triglyceride of 213.  He has chronically elevated triglyceride and he does take high-dose over-the-counter fish oil.  His triglycerides did improve from before.  He does admit to eating unhealthy food especially when he drives.  I discussed with him the importance of healthy diet.  Continue high-dose fish oil.  3. Essential hypertension: His blood pressure is elevated.  I increase lisinopril to 20 mg daily.   Disposition:   FU with me in 12 months  Signed,  Kathlyn Sacramento, MD  05/19/2021 9:45 AM    [place]

## 2021-05-19 NOTE — Patient Instructions (Addendum)
Medication Instructions:  Your physician has recommended you make the following change in your medication:   INCREASE Lisinopril to 20 mg daily. An Rx has been sent to your pharmacy.   *If you need a refill on your cardiac medications before your next appointment, please call your pharmacy*   Lab Work: None ordered  If you have labs (blood work) drawn today and your tests are completely normal, you will receive your results only by: [place] (if you have MyChart) OR A paper copy in the mail If you have any lab test that is abnormal or we need to change your treatment, we will call you to review the results.   Testing/Procedures: Your physician has requested that you have an exercise tolerance test. For further information please visit [email or web address]. Please also follow instruction sheet, as given.    Follow-Up: At [facility], you and your health needs are our priority.  As part of our continuing mission to provide you with exceptional heart care, we have created designated Provider Care Teams.  These Care Teams include your primary Cardiologist (physician) and Advanced Practice Providers (APPs -  Physician Assistants and Nurse Practitioners) who all work together to provide you with the care you need, when you need it.  We recommend signing up for the patient portal called "MyChart".  Sign up information is provided on this After Visit Summary.  MyChart is used to connect with patients for Virtual Visits (Telemedicine).  Patients are able to view lab/test results, encounter notes, upcoming appointments, etc.  Non-urgent messages can be sent to your provider as well.   To learn more about what you can do with MyChart, go to [email or web address].    Your next appointment:   Your physician wants you to follow-up in: 1 year You will receive a reminder letter in the mail two months in advance. If you don't receive a letter, please call our office to schedule the follow-up  appointment.   The format for your next appointment:   In Person  Provider:   You may see Kathlyn Sacramento, MD or one of the following Advanced Practice Providers on your designated Care Team:   Murray Hodgkins, NP Christell Faith, PA-C Marrianne Mood, PA-C Cadence Kathlen Mody, [place]   Other Instructions  Please follow the pre-test instructions below:  - you may eat a light breakfast/ lunch prior to your procedure - no caffeine for 24 hours prior to your test (coffee, tea, soft drinks, or chocolate)  - no smoking/ vaping for 4 hours prior to your test - you may take your regular medications the day of your test  - bring any inhalers with you to your test - wear comfortable clothing & tennis/ non-skid shoes to walk on the treadmill

## 2021-05-24 ENCOUNTER — Telehealth: Payer: Self-pay | Admitting: *Deleted

## 2021-05-24 NOTE — Telephone Encounter (Signed)
Called and spoke to pt's wife [place], [place] approved.  Reviewed pt's instructions for treadmill stress test in our office tomorrow 05/25/21 @ 10 AM.  Durward Fortes that pt arrive 15 min prior to appt time.  Pt does not take beta blocker.  Attestation form has been signed.  Pt's wife has no further questions at this time.

## 2021-05-25 ENCOUNTER — Other Ambulatory Visit: Payer: Self-pay

## 2021-05-25 ENCOUNTER — Ambulatory Visit (INDEPENDENT_AMBULATORY_CARE_PROVIDER_SITE_OTHER): Payer: PPO

## 2021-05-25 DIAGNOSIS — I2510 Atherosclerotic heart disease of native coronary artery without angina pectoris: Secondary | ICD-10-CM

## 2021-05-26 LAB — EXERCISE TOLERANCE TEST
Angina Index: 0
Base ST Depression (mm): 0 mm
Duke Treadmill Score: 9
Estimated workload: 10.1
Exercise duration (min): 8 min
Exercise duration (sec): 59 s
MPHR: 149 {beats}/min
Peak HR: 131 {beats}/min
Percent HR: 87 %
RPE: 15
Rest HR: 72 {beats}/min
ST Depression (mm): 0 mm

## 2021-06-01 ENCOUNTER — Ambulatory Visit: Payer: Self-pay | Admitting: Urology

## 2021-06-02 ENCOUNTER — Ambulatory Visit: Payer: PPO | Admitting: Cardiovascular Disease

## 2021-06-03 ENCOUNTER — Telehealth: Payer: Self-pay

## 2021-06-03 DIAGNOSIS — Z79899 Other long term (current) drug therapy: Secondary | ICD-10-CM

## 2021-06-03 DIAGNOSIS — I10 Essential (primary) hypertension: Secondary | ICD-10-CM

## 2021-06-03 NOTE — Telephone Encounter (Signed)
Attempted to reach pt regarding stress test, unable to make contact with pt LMTCB for resutls  Inform patient that  stress test was normal.  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  His blood pressure increased significantly during stress test and thus, recommend increasing lisinopril to 40 mg once daily and check basic metabolic profile in 1 week.

## 2021-06-07 MED ORDER — LISINOPRIL 40 MG PO TABS: ORAL_TABLET | ORAL | 3 refills | Status: DC

## 2021-06-07 NOTE — Telephone Encounter (Signed)
Able to return pt's call regarding recent stress test, Dr. Fletcher Anon had a chance to review his results and advised     Ralph Hampshire, MD  06/01/2021  4:09 PM EDT     Inform patient that  stress test was normal.  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 His blood pressure increased significantly during stress test and thus, recommend increasing lisinopril to 40 mg once daily and check basic metabolic profile in 1 week.     [name] understands to increase his Lisinopril to 40 mg daily, will pick up newly order medications at their local pharmacy, BMP schedule for 11/11 at 1:30 pm, otherwise all questions or concerns were address and no additional concerns at this time. Agreeable to plan, will call back for anything further.    Printed report of stress test, pt will pick up on Thursday to take to DOT physical.

## 2021-06-07 NOTE — Telephone Encounter (Signed)
Patient returning call for resul

## 2021-06-07 NOTE — Addendum Note (Signed)
Addended by: Wynema Birch on: 06/07/2021 01:38 PM   Modules accepted: Orders

## 2021-06-10 ENCOUNTER — Ambulatory Visit: Payer: PPO | Admitting: Urology

## 2021-06-17 ENCOUNTER — Other Ambulatory Visit: Payer: Self-pay

## 2021-06-17 ENCOUNTER — Other Ambulatory Visit (INDEPENDENT_AMBULATORY_CARE_PROVIDER_SITE_OTHER): Payer: PPO

## 2021-06-17 DIAGNOSIS — Z79899 Other long term (current) drug therapy: Secondary | ICD-10-CM

## 2021-06-18 LAB — BASIC METABOLIC PANEL
BUN/Creatinine Ratio: 15 (ref 10–24)
BUN: 15 mg/dL (ref 8–27)
CO2: 23 mmol/L (ref 20–29)
Calcium: 9 mg/dL (ref 8.6–10.2)
Chloride: 109 mmol/L — ABNORMAL HIGH (ref 96–106)
Creatinine, Ser: 0.97 mg/dL (ref 0.76–1.27)
Glucose: 98 mg/dL (ref 70–99)
Potassium: 4.8 mmol/L (ref 3.5–5.2)
Sodium: 144 mmol/L (ref 134–144)
eGFR: 83 mL/min/{1.73_m2} (ref >59)

## 2021-07-01 ENCOUNTER — Other Ambulatory Visit: Payer: Self-pay | Admitting: Nurse Practitioner

## 2021-07-27 ENCOUNTER — Encounter: Payer: Self-pay | Admitting: Family Medicine

## 2021-07-27 ENCOUNTER — Other Ambulatory Visit: Payer: Self-pay

## 2021-07-27 ENCOUNTER — Ambulatory Visit (INDEPENDENT_AMBULATORY_CARE_PROVIDER_SITE_OTHER): Payer: PPO | Admitting: Family Medicine

## 2021-07-27 VITALS — Temp 99.0°F

## 2021-07-27 DIAGNOSIS — R5381 Other malaise: Secondary | ICD-10-CM | POA: Insufficient documentation

## 2021-07-27 DIAGNOSIS — R058 Other specified cough: Secondary | ICD-10-CM | POA: Insufficient documentation

## 2021-07-27 DIAGNOSIS — R5383 Other fatigue: Secondary | ICD-10-CM | POA: Diagnosis not present

## 2021-07-27 DIAGNOSIS — U071 COVID-19: Secondary | ICD-10-CM | POA: Insufficient documentation

## 2021-07-27 MED ORDER — GUAIFENESIN-CODEINE 100-10 MG/5ML PO SOLN
10.0000 mL | Freq: Every evening | ORAL | 0 refills | Status: DC | PRN
Start: 2021-07-27 — End: 2021-08-19

## 2021-07-27 MED ORDER — NIRMATRELVIR/RITONAVIR (PAXLOVID) TABLET (RENAL DOSING): 2.0000 | ORAL_TABLET | Freq: Two times a day (BID) | ORAL | 0 refills | Status: AC

## 2021-07-27 MED ORDER — GUAIFENESIN-DM 100-10 MG/5ML PO SYRP: 10.0000 mL | ORAL_SOLUTION | ORAL | 1 refills | Status: DC | PRN

## 2021-07-27 NOTE — Assessment & Plan Note (Signed)
Reinforce 5 day quarantine and masking given Covid confirmation of diagnosis

## 2021-07-27 NOTE — Assessment & Plan Note (Signed)
Renal dose anti-viral therapy Encouraged increase of hydration to prevent further kidney/renal damage

## 2021-07-27 NOTE — Assessment & Plan Note (Signed)
Reinforced need for hydration with power ade or similar and water Cough syrup provided for AM and PM Driving precautions provided for codeine cough syrup

## 2021-07-27 NOTE — Progress Notes (Signed)
Virtual telephone visit    Virtual Visit via Telephone Note   This visit type was conducted due to national recommendations for restrictions regarding the COVID-19 Pandemic (e.g. social distancing) in an effort to limit this patient's exposure and mitigate transmission in our community. Due to his co-morbid illnesses, this patient is at least at moderate risk for complications without adequate follow up. This format is felt to be most appropriate for this patient at this time. The patient did not have access to video technology or had technical difficulties with video requiring transitioning to audio format only (telephone). Physical exam was limited to content and character of the telephone converstion.    Patient location: home   Provider location: [facility]   I discussed the limitations of evaluation and management by telemedicine and the availability of in person appointments. The patient expressed understanding and agreed to proceed.   Visit Date: 07/27/2021  Today's healthcare provider: Gwyneth Sprout, FNP   Chief Complaint  Patient presents with   Covid Positive   Subjective    HPI HPI   Patient presents today after testing positive for Covid this morning. Patient stats that symptoms began yesterday 07/26/21. Patient complains of congestion, fatigue, myalgia, sore throat and dry cough. Patient states that he has been running a temperature of 99 but no fever. Patient has been taking otc Mucinex for relief.  Last edited by Minette Headland, CMA on 07/27/2021  9:17 AM.         Medications: Outpatient Medications Prior to Visit  Medication Sig   aspirin 81 MG EC tablet Take 1 tablet (81 mg total) by mouth daily.   atorvastatin (LIPITOR) 80 MG tablet Take 1 tablet (80 mg total) by mouth daily.   ENSURE PLUS (ENSURE PLUS) LIQD Take 237 mLs by mouth daily.   lisinopril (ZESTRIL) 40 MG tablet TAKE 1 TABLET BY MOUTH ONCE DAILY PATIENT   Multiple Vitamin (MULTIVITAMIN) tablet  Take 1 tablet by mouth every evening.    Omega-3 Fatty Acids (FISH OIL) 1000 MG CAPS Take 4,000 mg by mouth.   No facility-administered medications prior to visit.    Review of Systems    Objective    Temp 99 F (37.2 C) (Oral)      Assessment & Plan     Problem List Items Addressed This Visit       Other   COVID-19 - Primary    Renal dose anti-viral therapy Encouraged increase of hydration to prevent further kidney/renal damage      Relevant Medications   nirmatrelvir/ritonavir EUA, renal dosing, (PAXLOVID) 10 x 150 MG & 10 x 100MG  TABS   Productive cough    Reinforced need for hydration with power ade or similar and water Cough syrup provided for AM and PM Driving precautions provided for codeine cough syrup      Relevant Medications   guaiFENesin-codeine 100-10 MG/5ML syrup   guaiFENesin-dextromethorphan (ROBITUSSIN DM) 100-10 MG/5ML syrup   Malaise and fatigue    Reinforce 5 day quarantine and masking given Covid confirmation of diagnosis        No follow-ups on file.    I discussed the assessment and treatment plan with the patient. The patient was provided an opportunity to ask questions and all were answered. The patient agreed with the plan and demonstrated an understanding of the instructions.   The patient was advised to call back or seek an in-person evaluation if the symptoms worsen or if the condition fails to improve  as anticipated.  I provided 7 minutes of non-face-to-face time during this encounter.  Vonna Kotyk, FNP, have reviewed all documentation for this visit. The documentation on 07/27/21 for the exam, diagnosis, procedures, and orders are all accurate and complete.   Gwyneth Sprout, [place] [phone] (phone) [phone] (fax)  [place]

## 2021-08-15 ENCOUNTER — Telehealth: Payer: Self-pay | Admitting: *Deleted

## 2021-08-15 NOTE — Telephone Encounter (Signed)
Patient requesting a follow up with his blood pressure reading with Dr. Caryn Section. He is unsure if with the physician or nurse.   Patient needs a Thursday or Friday appointment.  No availability found thru March '23. Routing to office to contact with appointment date.

## 2021-08-15 NOTE — Telephone Encounter (Signed)
Please review.  Will you be able to work him in on Friday?  Thanks,   -Mickel Baas

## 2021-08-15 NOTE — Telephone Encounter (Signed)
Appt scheduled for 1pm

## 2021-08-15 NOTE — Telephone Encounter (Signed)
He can have the 1pm or 1:20pm on Friday.

## 2021-08-19 ENCOUNTER — Ambulatory Visit (INDEPENDENT_AMBULATORY_CARE_PROVIDER_SITE_OTHER): Payer: PPO | Admitting: Family Medicine

## 2021-08-19 ENCOUNTER — Encounter: Payer: Self-pay | Admitting: Family Medicine

## 2021-08-19 ENCOUNTER — Other Ambulatory Visit: Payer: Self-pay

## 2021-08-19 VITALS — BP 122/66 | HR 65 | Temp 97.8°F | Resp 16 | Wt 201.0 lb

## 2021-08-19 DIAGNOSIS — Z1211 Encounter for screening for malignant neoplasm of colon: Secondary | ICD-10-CM

## 2021-08-19 DIAGNOSIS — I10 Essential (primary) hypertension: Secondary | ICD-10-CM

## 2021-08-19 NOTE — Progress Notes (Signed)
Established patient visit   Patient: [name]   DOB: 1950/11/14   71 y.o. Male  MRN: [id number] Visit Date: 08/19/2021  Today's healthcare provider: [name], MD   Chief Complaint  Patient presents with   Hypertension   I,Sulibeya S Dimas,acting as a scribe for [name], MD.,have documented all relevant documentation on the behalf of [name], MD,as directed by  [name], MD while in the presence of [name], MD.  Subjective    HPI  Hypertension, follow-up  BP Readings from Last 3 Encounters:  08/19/21 122/66  05/19/21 (!) 148/70  11/12/20 138/70   Wt Readings from Last 3 Encounters:  08/19/21 201 lb (91.2 kg)  05/19/21 201 lb 4 oz (91.3 kg)  11/12/20 198 lb (89.8 kg)     He was last seen for hypertension 6 months ago.  BP at that visit was 138/70. Management since that visit include patient was seen on: 06/01/21 by Dr. Fletcher Anon and lisinopril was increased to 77m dialy.  He reports excellent compliance with treatment. He is not having side effects.  He is following a Regular diet. He is not exercising. He does not smoke.  Use of agents associated with hypertension: none.   Outside blood pressures are stable. Symptoms: No chest pain No chest pressure  No palpitations No syncope  No dyspnea No orthopnea  No paroxysmal nocturnal dyspnea No lower extremity edema   Pertinent labs: Lab Results  Component Value Date   CHOL 137 11/12/2020   HDL 28 (L) 11/12/2020   LDLCALC 73 11/12/2020   TRIG 213 (H) 11/12/2020   CHOLHDL 4.9 11/12/2020   Lab Results  Component Value Date   NA 144 06/17/2021   K 4.8 06/17/2021   CREATININE 0.97 06/17/2021   EGFR 83 06/17/2021   GLUCOSE 98 06/17/2021   TSH 1.100 11/12/2020     The 10-year ASCVD risk score (Arnett DK, et al., 2019) is: 23.4%   ---------------------------------------------------------------------------------------------------   Medications: Outpatient Medications Prior  to Visit  Medication Sig   aspirin 81 MG EC tablet Take 1 tablet (81 mg total) by mouth daily.   atorvastatin (LIPITOR) 80 MG tablet Take 1 tablet (80 mg total) by mouth daily.   ENSURE PLUS (ENSURE PLUS) LIQD Take 237 mLs by mouth daily.   guaiFENesin-codeine 100-10 MG/5ML syrup Take 10 mLs by mouth at bedtime and may repeat dose one time if needed.   guaiFENesin-dextromethorphan (ROBITUSSIN DM) 100-10 MG/5ML syrup Take 10 mLs by mouth every 4 (four) hours as needed for cough.   lisinopril (ZESTRIL) 40 MG tablet TAKE 1 TABLET BY MOUTH ONCE DAILY PATIENT   Multiple Vitamin (MULTIVITAMIN) tablet Take 1 tablet by mouth every evening.    Omega-3 Fatty Acids (FISH OIL) 1000 MG CAPS Take 4,000 mg by mouth.   No facility-administered medications prior to visit.    Review of Systems  Constitutional:  Negative for appetite change and fatigue.  Respiratory:  Negative for shortness of breath.   Cardiovascular:  Negative for chest pain, palpitations and leg swelling.       Objective    BP 122/66 (BP Location: Left Arm, Patient Position: Sitting, Cuff Size: Large)    Pulse 65    Temp 97.8 F (36.6 C) (Temporal)    Resp 16    Wt 201 lb (91.2 kg)    SpO2 97%    BMI 32.44 kg/m  BP Readings from Last 3 Encounters:  08/19/21 122/66  05/19/21 (!) 148/70  11/12/20 138/70   Wt Readings from Last 3 Encounters:  08/19/21 201 lb (91.2 kg)  05/19/21 201 lb 4 oz (91.3 kg)  11/12/20 198 lb (89.8 kg)      Physical Exam  General appearance: Obese male, cooperative and in no acute distress Head: Normocephalic, without obvious abnormality, atraumatic Respiratory: Respirations even and unlabored, normal respiratory rate     Assessment & Plan     1. Primary hypertension Well controlled, tolerating increased does of lisinopril. Follow up labs ordered by Dr. Fletcher Anon were normal. Continue current medications.    2. Colon cancer screening Due for  - Ambulatory referral to Gastroenterology   Flu  vaccine was recommended but refused by patient  Future Appointments  Date Time Provider [place]  01/20/2022 10:00 AM Birdie Sons, MD [facility]  06/12/2022  1:00 PM Stoioff, Ronda Fairly, MD [facility] None         The entirety of the information documented in the History of Present Illness, Review of Systems and Physical Exam were personally obtained by me. Portions of this information were initially documented by the CMA and reviewed by me for thoroughness and accuracy.     [name], MD  [facility] [phone] (phone) [phone] (fax)  [place]

## 2022-01-18 NOTE — Progress Notes (Signed)
I,[name],acting as a scribe for Ralph Huh, MD.,have documented all relevant documentation on the behalf of Ralph Huh, MD,as directed by  Ralph Huh, MD while in the presence of Ralph Huh, MD.   Complete Physical Exam      Patient: [name], Male    DOB: Dec 10, 1950, 71 y.o.   MRN: [id number] Visit Date: 01/20/2022  Today's Provider: [name], MD    Subjective    [name] is a 71 y.o. male who presents today for his complete physical examination  He reports consuming a general diet. The patient does not participate in regular exercise at present. He generally feels fairly well. He reports sleeping fairly well. He does have additional problems to discuss today (hears crunching sound in left ear).   HPI Hypertension, follow-up  BP Readings from Last 3 Encounters:  01/20/22 106/69  08/19/21 122/66  05/19/21 (!) 148/70   Wt Readings from Last 3 Encounters:  01/20/22 200 lb (90.7 kg)  08/19/21 201 lb (91.2 kg)  05/19/21 201 lb 4 oz (91.3 kg)     He was last seen for hypertension 5 months ago.  BP at that visit was 122/66. Management since that visit includes continue same medications.  He reports good compliance with treatment. He is not having side effects.  He is following a Regular diet. He is not exercising. He does not smoke.  Use of agents associated with hypertension: NSAIDS.   Outside blood pressures are checked occasionally. Symptoms: No chest pain No chest pressure  No palpitations No syncope  No dyspnea No orthopnea  No paroxysmal nocturnal dyspnea No lower extremity edema   Pertinent labs Lab Results  Component Value Date   CHOL 137 11/12/2020   HDL 28 (L) 11/12/2020   LDLCALC 73 11/12/2020   TRIG 213 (H) 11/12/2020   CHOLHDL 4.9 11/12/2020   Lab Results  Component Value Date   NA 144 06/17/2021   K 4.8 06/17/2021   CREATININE 0.97 06/17/2021   EGFR 83 06/17/2021   GLUCOSE 98 06/17/2021   TSH 1.100  11/12/2020     The 10-year ASCVD risk score (Arnett DK, et al., 2019) is: 18.7%  ---------------------------------------------------------------------------------------------------   Lipid/Cholesterol, Follow-up  Last lipid panel Other pertinent labs  Lab Results  Component Value Date   CHOL 137 11/12/2020   HDL 28 (L) 11/12/2020   LDLCALC 73 11/12/2020   TRIG 213 (H) 11/12/2020   CHOLHDL 4.9 11/12/2020   Lab Results  Component Value Date   ALT 23 11/12/2020   AST 22 11/12/2020   PLT 210 11/12/2020   TSH 1.100 11/12/2020     He was last seen for this 1  year  ago.  Management since that visit includes continuing same medication.  He reports good compliance with treatment. He is not having side effects.   Symptoms: No chest pain No chest pressure/discomfort  No dyspnea No lower extremity edema  No numbness or tingling of extremity No orthopnea  No palpitations No paroxysmal nocturnal dyspnea  No speech difficulty No syncope   Current diet: in general, an "unhealthy" diet Current exercise: none  The 10-year ASCVD risk score (Arnett DK, et al., 2019) is: 18.7%  ---------------------------------------------------------------------------------------------------    Medications: Outpatient Medications Prior to Visit  Medication Sig   aspirin 81 MG EC tablet Take 1 tablet (81 mg total) by mouth daily.   atorvastatin (LIPITOR) 80 MG tablet Take 1 tablet (80 mg total) by mouth daily.   ENSURE  PLUS (ENSURE PLUS) LIQD Take 237 mLs by mouth daily.   lisinopril (ZESTRIL) 40 MG tablet TAKE 1 TABLET BY MOUTH ONCE DAILY PATIENT   Multiple Vitamin (MULTIVITAMIN) tablet Take 1 tablet by mouth every evening.    Omega-3 Fatty Acids (FISH OIL) 1000 MG CAPS Take 4,000 mg by mouth.   No facility-administered medications prior to visit.    Allergies  Allergen Reactions   Lovastatin Other (See Comments)    Chest pain   Pravastatin Other (See Comments)    Nose bleed    Patient  Care Team: Birdie Sons, MD as PCP - General (Family Medicine) Wellington Hampshire, MD as PCP - Cardiology (Cardiology) Earnestine Leys, MD as Consulting Physician (Specialist) Abbie Sons, MD (Urology) Ralene Bathe, MD (Dermatology) [place], [facility])  Review of Systems  Constitutional:  Negative for appetite change, chills, fatigue and fever.  HENT:  Positive for tinnitus. Negative for congestion, ear pain, hearing loss, nosebleeds and trouble swallowing.   Eyes:  Negative for pain and visual disturbance.  Respiratory:  Negative for cough, chest tightness and shortness of breath.   Cardiovascular:  Negative for chest pain, palpitations and leg swelling.  Gastrointestinal:  Negative for abdominal pain, blood in stool, constipation, diarrhea, nausea and vomiting.  Endocrine: Negative for polydipsia, polyphagia and polyuria.  Genitourinary:  Positive for enuresis and frequency. Negative for dysuria and flank pain.  Musculoskeletal:  Positive for arthralgias and myalgias. Negative for back pain, joint swelling and neck stiffness.  Skin:  Negative for color change, rash and wound.  Neurological:  Negative for dizziness, tremors, seizures, speech difficulty, weakness, light-headedness and headaches.  Psychiatric/Behavioral:  Negative for behavioral problems, confusion, decreased concentration, dysphoric mood and sleep disturbance. The patient is not nervous/anxious.   All other systems reviewed and are negative.       Objective    Vitals: BP 106/69 (BP Location: Right Arm, Patient Position: Sitting, Cuff Size: Large)   Pulse 69   Temp 97.8 F (36.6 C) (Oral)   Resp 14   Ht _0  (1.676 m)   Wt 200 lb (90.7 kg)   SpO2 99% Comment: room air  BMI 32.28 kg/m   Today's Vitals   01/20/22 0959 01/20/22 1008  BP: (!) 100/58 106/69  Pulse: 69   Resp: 14   Temp: 97.8 F (36.6 C)   TempSrc: Oral   SpO2: 99%   Weight: 200 lb (90.7 kg)   Height: _1  (1.676  m)    Body mass index is 32.28 kg/m.   Physical Exam  General Appearance:    Mildly obese male. Alert, cooperative, in no acute distress, appears stated age  Head:    Normocephalic, without obvious abnormality, atraumatic  Eyes:    PERRL, conjunctiva/corneas clear, EOM's intact, fundi    benign, both eyes       Ears:    Normal TM's and external ear canals, both ears  Nose:   Nares normal, septum midline, mucosa normal, no drainage   or sinus tenderness  Throat:   Lips, mucosa, and tongue normal; teeth and gums normal  Neck:   Supple, symmetrical, trachea midline, no adenopathy;       thyroid:  No enlargement/tenderness/nodules; no carotid   bruit or JVD  Back:     Symmetric, no curvature, ROM normal, no CVA tenderness  Lungs:     Clear to auscultation bilaterally, respirations unlabored  Chest wall:    No tenderness or deformity  Heart:  Normal heart rate. Normal rhythm. No murmurs, rubs, or gallops.  S1 and S2 normal  Abdomen:     Soft, non-tender, bowel sounds active all four quadrants,    no masses, no organomegaly  Genitalia:    deferred  Rectal:    deferred  Extremities:   All extremities are intact. No cyanosis or edema  Pulses:   2+ and symmetric all extremities  Skin:   Skin color, texture, turgor normal, no rashes or lesions  Lymph nodes:   Cervical, supraclavicular, and axillary nodes normal  Neurologic:   CNII-XII intact. Normal strength, sensation and reflexes      throughout     Assessment & Plan    1. Annual physical exam  2. Personal history of colonic polyps  Is scheduled for colonoscopy in July  3. Prostate cancer screening  - PSA Total (Reflex To Free) ([facility] only)  4. Acute otitis externa of left ear, unspecified type  - neomycin-polymyxin-hydrocortisone (CORTISPORIN) OTIC solution; Place 3 drops into the left ear 4 (four) times daily for 7 days.  Dispense: 10 mL; Refill: 0   5. Primary hypertension Well controlled.  Continue current  medications.   - CBC - Comprehensive metabolic panel - Lipid panel - TSH  6. Coronary artery disease involving native coronary artery of native heart without angina pectoris Asymptomatic. Compliant with medication.  Continue aggressive risk factor modification.  Continue routine follow Dr. Fletcher Anon.   7. Mixed hyperlipidemia He is tolerating atorvastatin well with no adverse effects.        The entirety of the information documented in the History of Present Illness, Review of Systems and Physical Exam were personally obtained by me. Portions of this information were initially documented by the CMA and reviewed by me for thoroughness and accuracy.     Ralph Huh, MD  [facility] [phone] (phone) [phone] (fax)  [place]

## 2022-01-20 ENCOUNTER — Encounter: Payer: Self-pay | Admitting: Family Medicine

## 2022-01-20 ENCOUNTER — Ambulatory Visit (INDEPENDENT_AMBULATORY_CARE_PROVIDER_SITE_OTHER): Payer: PPO | Admitting: Family Medicine

## 2022-01-20 VITALS — BP 106/69 | HR 69 | Temp 97.8°F | Resp 14 | Ht 66.0 in | Wt 200.0 lb

## 2022-01-20 DIAGNOSIS — I10 Essential (primary) hypertension: Secondary | ICD-10-CM | POA: Diagnosis not present

## 2022-01-20 DIAGNOSIS — Z125 Encounter for screening for malignant neoplasm of prostate: Secondary | ICD-10-CM | POA: Diagnosis not present

## 2022-01-20 DIAGNOSIS — Z86010 Personal history of colonic polyps: Secondary | ICD-10-CM

## 2022-01-20 DIAGNOSIS — I2510 Atherosclerotic heart disease of native coronary artery without angina pectoris: Secondary | ICD-10-CM | POA: Diagnosis not present

## 2022-01-20 DIAGNOSIS — H60502 Unspecified acute noninfective otitis externa, left ear: Secondary | ICD-10-CM

## 2022-01-20 DIAGNOSIS — E782 Mixed hyperlipidemia: Secondary | ICD-10-CM

## 2022-01-20 DIAGNOSIS — Z1211 Encounter for screening for malignant neoplasm of colon: Secondary | ICD-10-CM

## 2022-01-20 DIAGNOSIS — Z0000 Encounter for general adult medical examination without abnormal findings: Secondary | ICD-10-CM

## 2022-01-20 MED ORDER — NEOMYCIN-POLYMYXIN-HC 3.5-10000-1 OT SOLN: 3.0000 [drp] | Freq: Four times a day (QID) | OTIC | 0 refills | Status: AC

## 2022-01-20 NOTE — Patient Instructions (Signed)
Please review the attached list of medications and notify my office if there are any errors.   You are overdue for a Tdap (tetanus-diptheria-pertussis vaccine) which protects you from tetanus and whooping cough. Please check with your insurance plan or pharmacy regarding coverage for this vaccine.   The CDC recommends two doses of Shingrix (the shingles vaccine) separated by 2 to 6 months for adults age 73 years and older. I recommend checking with your insurance plan regarding coverage for this vaccine.

## 2022-01-20 NOTE — Progress Notes (Signed)
Annual Wellness Visit     Patient: [name], Male    DOB: [birth year], 71 y.o.   MRN: [id number] Visit Date: 01/20/2022  Today's Provider: [name], MD    Subjective    [name] is a 71 y.o. male who presents today for his Annual Wellness Visit.  Medications: Outpatient Medications Prior to Visit  Medication Sig   aspirin 81 MG EC tablet Take 1 tablet (81 mg total) by mouth daily.   atorvastatin (LIPITOR) 80 MG tablet Take 1 tablet (80 mg total) by mouth daily.   ENSURE PLUS (ENSURE PLUS) LIQD Take 237 mLs by mouth daily.   lisinopril (ZESTRIL) 40 MG tablet TAKE 1 TABLET BY MOUTH ONCE DAILY PATIENT   Multiple Vitamin (MULTIVITAMIN) tablet Take 1 tablet by mouth every evening.    Omega-3 Fatty Acids (FISH OIL) 1000 MG CAPS Take 4,000 mg by mouth.   No facility-administered medications prior to visit.    Allergies  Allergen Reactions   Lovastatin Other (See Comments)    Chest pain   Pravastatin Other (See Comments)    Nose bleed    Patient Care Team: Birdie Sons, MD as PCP - General (Family Medicine) Wellington Hampshire, MD as PCP - Cardiology (Cardiology) Earnestine Leys, MD as Consulting Physician (Specialist) Abbie Sons, MD (Urology) Ralene Bathe, MD (Dermatology) [place], [place] (Optometry)        Objective     Most recent functional status assessment:    01/20/2022   10:01 AM  In your present state of health, do you have any difficulty performing the following activities:  Hearing? 0  Vision? 0  Difficulty concentrating or making decisions? 0  Walking or climbing stairs? 1  Dressing or bathing? 0  Doing errands, shopping? 0   Most recent fall risk assessment:    01/20/2022   10:01 AM  Fall Risk   Falls in the past year? 1  Number falls in past yr: 1  Injury with Fall? 0  Risk for fall due to : No Fall Risks  Follow up Falls evaluation completed    Most recent depression screenings:    01/20/2022    10:00 AM 11/12/2020   10:42 AM  PHQ 2/9 Scores  PHQ - 2 Score 0 0  PHQ- 9 Score 5 0   Most recent cognitive screening:    02/23/2017    9:15 AM  6CIT Screen  What Year? 0 points  What month? 0 points  What time? 0 points  Count back from 20 0 points  Months in reverse 0 points  Repeat phrase 0 points  Total Score 0 points   Most recent Audit-C alcohol use screening    01/20/2022   10:02 AM  Alcohol Use Disorder Test (AUDIT)  1. How often do you have a drink containing alcohol? 1  2. How many drinks containing alcohol do you have on a typical day when you are drinking? 0  3. How often do you have six or more drinks on one occasion? 0  AUDIT-C Score 1   A score of 3 or more in women, and 4 or more in men indicates increased risk for alcohol abuse, EXCEPT if all of the points are from question 1   No results found for any visits on 01/20/22.  Assessment & Plan     Annual wellness visit done today including the all of the following: Reviewed patient's Family Medical History Reviewed and updated  list of patient's medical providers Assessment of cognitive impairment was done Assessed patient's functional ability Established a written schedule for health screening [place] Completed and Reviewed  Exercise Activities and Dietary recommendations  Goals      Exercise 3x per week (30 min per time)     Recommend to exercise for 3 days a week for at least 30 minutes at a time.         Immunization History  Administered Date(s) Administered   Fluad Quad(high Dose 65+) 05/02/2019   Influenza, High Dose Seasonal PF 06/02/2017, 04/05/2018   Influenza-Unspecified 05/07/2015   PFIZER(Purple Top)SARS-COV-2 Vaccination 05/01/2020   Pneumococcal Conjugate-13 09/15/2015   Pneumococcal Polysaccharide-23 02/23/2017   Tdap 06/19/2008   Zoster, Live 09/15/2015    Health Maintenance  Topic Date Due   Zoster Vaccines- Shingrix (1 of 2) Never done   COLONOSCOPY  (Pts 45-52yr Insurance coverage will need to be confirmed)  01/13/2016   TETANUS/TDAP  06/19/2018   COVID-19 Vaccine (2 - Pfizer risk series) 05/22/2020   INFLUENZA VACCINE  03/07/2022   Pneumonia Vaccine 71 Years old  Completed   Hepatitis C Screening  Completed   HPV VACCINES  Aged Out     Discussed health benefits of physical activity, and encouraged him to engage in regular exercise appropriate for his age and condition.        The entirety of the information documented in the History of Present Illness, Review of Systems and Physical Exam were personally obtained by me. Portions of this information were initially documented by the CMA and reviewed by me for thoroughness and accuracy.     DLelon Huh MD  [phone](phone) [phone](fax)  [place]

## 2022-01-21 LAB — COMPREHENSIVE METABOLIC PANEL
ALT: 33 IU/L (ref 0–44)
AST: 25 IU/L (ref 0–40)
Albumin/Globulin Ratio: 1.8 (ref 1.2–2.2)
Albumin: 4.2 g/dL (ref 3.7–4.7)
Alkaline Phosphatase: 99 IU/L (ref 44–121)
BUN/Creatinine Ratio: 17 (ref 10–24)
BUN: 18 mg/dL (ref 8–27)
Bilirubin Total: 1.3 mg/dL — ABNORMAL HIGH (ref 0.0–1.2)
CO2: 24 mmol/L (ref 20–29)
Calcium: 9.2 mg/dL (ref 8.6–10.2)
Chloride: 102 mmol/L (ref 96–106)
Creatinine, Ser: 1.08 mg/dL (ref 0.76–1.27)
Globulin, Total: 2.3 g/dL (ref 1.5–4.5)
Glucose: 139 mg/dL — ABNORMAL HIGH (ref 70–99)
Potassium: 5 mmol/L (ref 3.5–5.2)
Sodium: 139 mmol/L (ref 134–144)
Total Protein: 6.5 g/dL (ref 6.0–8.5)
eGFR: 73 mL/min/{1.73_m2} (ref >59)

## 2022-01-21 LAB — CBC
Hematocrit: 42.2 % (ref 37.5–51.0)
Hemoglobin: 13.9 g/dL (ref 13.0–17.7)
MCH: 27.4 pg (ref 26.6–33.0)
MCHC: 32.9 g/dL (ref 31.5–35.7)
MCV: 83 fL (ref 79–97)
Platelets: 207 10*3/uL (ref 150–450)
RBC: 5.07 x10E6/uL (ref 4.14–5.80)
RDW: 14.5 % (ref 11.6–15.4)
WBC: 8.2 10*3/uL (ref 3.4–10.8)

## 2022-01-21 LAB — LIPID PANEL
Chol/HDL Ratio: 5.6 ratio — ABNORMAL HIGH (ref 0.0–5.0)
Cholesterol, Total: 145 mg/dL (ref 100–199)
HDL: 26 mg/dL — ABNORMAL LOW (ref >39)
LDL Chol Calc (NIH): 70 mg/dL (ref 0–99)
Triglycerides: 301 mg/dL — ABNORMAL HIGH (ref 0–149)
VLDL Cholesterol Cal: 49 mg/dL — ABNORMAL HIGH (ref 5–40)

## 2022-01-21 LAB — PSA TOTAL (REFLEX TO FREE): Prostate Specific Ag, Serum: 0.8 ng/mL (ref 0.0–4.0)

## 2022-01-21 LAB — TSH: TSH: 0.866 u[IU]/mL (ref 0.450–4.500)

## 2022-01-24 ENCOUNTER — Other Ambulatory Visit: Payer: Self-pay

## 2022-01-24 ENCOUNTER — Telehealth: Payer: Self-pay

## 2022-01-24 MED ORDER — OMEGA-3-ACID ETHYL ESTERS 1 G PO CAPS: 2.0000 g | ORAL_CAPSULE | Freq: Two times a day (BID) | ORAL | 0 refills | Status: DC

## 2022-01-24 NOTE — Telephone Encounter (Addendum)
-----   Message from April M Miller, [place] sent at 01/24/2022  9:01 AM EDT -----  ----- Message ----- From: Elliot Cousin, RN Sent: 01/24/2022   8:44 AM EDT To: Gila Pool  Result note read to pt, verbalizes understanding. 3 month F/U secured for 05/05/22. Pt agreeable to Rx med.   ----------------------------------------------------------------------------------------------  Alanson Puls, [place]  01/23/2022  8:36 AM EDT   [facility] to review lab results.  Okay for [facility] triage to advise.     -------------------------------------------------------------------------------------------------   Birdie Sons, MD  01/21/2022  7:38 AM EDT   Triglycerides are high and hdl cholesterol is low. Recommend he change from OTC fish oil to Lovaza '1000mg'$  2 tablets twice a day. Please send in prescription for #120 rf x x and schedule follow up in 3 months. Continue '80mg'$  atorvastatin.

## 2022-01-24 NOTE — Telephone Encounter (Signed)
Prescription sent into pharmacy

## 2022-02-24 DIAGNOSIS — Z86010 Personal history of colonic polyps: Secondary | ICD-10-CM | POA: Diagnosis not present

## 2022-02-24 DIAGNOSIS — M25559 Pain in unspecified hip: Secondary | ICD-10-CM | POA: Insufficient documentation

## 2022-05-04 NOTE — Progress Notes (Signed)
I,[name],acting as a scribe for Ralph Huh, MD.,have documented all relevant documentation on the behalf of Ralph Huh, MD,as directed by  Ralph Huh, MD while in the presence of Ralph Huh, MD.   Established patient visit   Patient: [name]   DOB: 1950-12-16   71 y.o. Male  MRN: [id number] Visit Date: 05/05/2022  Today's healthcare provider: [name], MD   Chief Complaint  Patient presents with   Hypertension   Hyperlipidemia   Subjective    HPI  Hypertension, follow-up  BP Readings from Last 3 Encounters:  05/05/22 120/63  01/20/22 106/69  08/19/21 122/66   Wt Readings from Last 3 Encounters:  05/05/22 201 lb (91.2 kg)  01/20/22 200 lb (90.7 kg)  08/19/21 201 lb (91.2 kg)     He was last seen for hypertension 3 months ago.  BP at that visit was 106/69. Management since that visit includes continue same medication.  He reports good compliance with treatment. He is not having side effects.  He is following a Regular diet. He is exercising. He does not smoke.  Use of agents associated with hypertension: NSAIDS.   Outside blood pressures are checked occasionally. Symptoms: No chest pain No chest pressure  No palpitations No syncope  No dyspnea No orthopnea  No paroxysmal nocturnal dyspnea No lower extremity edema    The 10-year ASCVD risk score (Arnett DK, et al., 2019) is: 24%  ---------------------------------------------------------------------------------------------------   Lipid/Cholesterol, Follow-up  Last lipid panel Other pertinent labs  Lab Results  Component Value Date   CHOL 145 01/20/2022   HDL 26 (L) 01/20/2022   LDLCALC 70 01/20/2022   TRIG 301 (H) 01/20/2022   CHOLHDL 5.6 (H) 01/20/2022   Lab Results  Component Value Date   ALT 33 01/20/2022   AST 25 01/20/2022   PLT 207 01/20/2022   TSH 0.866 01/20/2022     He was last seen for this 3 months ago.  Management since that visit includes  changing from OTC fish oil to Lovaza '1000mg'$  2 tablets twice a day. Continue '80mg'$  atorvastatin.   He reports fair compliance with treatment. Tried taking Lovaza, but changed back to OTC Omega 3 fish oil due to cost. He is not having side effects.   Symptoms: No chest pai No chest pressure/discomfort  No dyspnea No lower extremity edema  No numbness or tingling of extremity No orthopnea  No palpitations No paroxysmal nocturnal dyspnea  No speech difficulty No syncope   Current diet: well balanced Current exercise: none  The 10-year ASCVD risk score (Arnett DK, et al., 2019) is: 24%  ---------------------------------------------------------------------------------------------------  Also to check A1c today due to elevated glucose when last checked.   Medications: Outpatient Medications Prior to Visit  Medication Sig   aspirin 81 MG EC tablet Take 1 tablet (81 mg total) by mouth daily.   atorvastatin (LIPITOR) 80 MG tablet Take 1 tablet (80 mg total) by mouth daily.   ENSURE PLUS (ENSURE PLUS) LIQD Take 237 mLs by mouth daily.   lisinopril (ZESTRIL) 40 MG tablet TAKE 1 TABLET BY MOUTH ONCE DAILY PATIENT   Multiple Vitamin (MULTIVITAMIN) tablet Take 1 tablet by mouth every evening.    Omega-3 Fatty Acids (FISH OIL) 1000 MG CAPS Take 4,000 mg by mouth.   omega-3 acid ethyl esters (LOVAZA) 1 g capsule Take 2 capsules (2 g total) by mouth 2 (two) times daily. (Patient not taking: Reported on 05/05/2022)   No facility-administered medications prior  to visit.    Review of Systems  Constitutional:  Negative for appetite change, chills and fever.  Respiratory:  Negative for chest tightness, shortness of breath and wheezing.   Cardiovascular:  Negative for chest pain and palpitations.  Gastrointestinal:  Negative for abdominal pain, nausea and vomiting.       Objective    BP 120/63 (BP Location: Left Arm, Patient Position: Sitting, Cuff Size: Large)   Pulse 66   Temp 97.6 F (36.4 C)  (Oral)   Wt 201 lb (91.2 kg)   SpO2 100% Comment: room air  BMI 32.44 kg/m    Physical Exam  General appearance: Mildly obese male, cooperative and in no acute distress Head: Normocephalic, without obvious abnormality, atraumatic Respiratory: Respirations even and unlabored, normal respiratory rate Extremities: All extremities are intact.  Skin: Skin color, texture, turgor normal. No rashes seen  Psych: Appropriate mood and affect. Neurologic: Mental status: Alert, oriented to person, place, and time, thought content appropriate.   Assessment & Plan     1. Mixed hyperlipidemia He briefly changed from OTC fish oil to Lovaza but states insurance did not cover medication so is no back on OTC fish oil. He is agreeable to trying generic Vascepa if triglycerides not much better.  - Lipid panel  2. Hyperglycemia  - Hemoglobin A1c  3. Coronary artery disease involving native coronary artery of native heart without angina pectoris Asymptomatic. Compliant with medication.  Continue aggressive risk factor modification.  Follow up cardiology as scheduled.  - Lipid panel  4. Bunion, right Has been bothering several months, but not currently interested in seeing podiatry. Advised he could call back for referral if he changes his mind.        The entirety of the information documented in the History of Present Illness, Review of Systems and Physical Exam were personally obtained by me. Portions of this information were initially documented by the CMA and reviewed by me for thoroughness and accuracy.     Ralph Huh, MD  [facility] [phone] (phone) [phone] (fax)  [place]

## 2022-05-05 ENCOUNTER — Encounter: Payer: Self-pay | Admitting: Family Medicine

## 2022-05-05 ENCOUNTER — Ambulatory Visit (INDEPENDENT_AMBULATORY_CARE_PROVIDER_SITE_OTHER): Payer: PPO | Admitting: Family Medicine

## 2022-05-05 VITALS — BP 120/63 | HR 66 | Temp 97.6°F | Wt 201.0 lb

## 2022-05-05 DIAGNOSIS — M21611 Bunion of right foot: Secondary | ICD-10-CM

## 2022-05-05 DIAGNOSIS — I2510 Atherosclerotic heart disease of native coronary artery without angina pectoris: Secondary | ICD-10-CM

## 2022-05-05 DIAGNOSIS — R739 Hyperglycemia, unspecified: Secondary | ICD-10-CM

## 2022-05-05 DIAGNOSIS — E782 Mixed hyperlipidemia: Secondary | ICD-10-CM

## 2022-05-06 LAB — HEMOGLOBIN A1C
Est. average glucose Bld gHb Est-mCnc: 146 mg/dL
Hgb A1c MFr Bld: 6.7 % — ABNORMAL HIGH (ref 4.8–5.6)

## 2022-05-06 LAB — LIPID PANEL
Chol/HDL Ratio: 5.6 ratio — ABNORMAL HIGH (ref 0.0–5.0)
Cholesterol, Total: 151 mg/dL (ref 100–199)
HDL: 27 mg/dL — ABNORMAL LOW (ref >39)
LDL Chol Calc (NIH): 78 mg/dL (ref 0–99)
Triglycerides: 283 mg/dL — ABNORMAL HIGH (ref 0–149)
VLDL Cholesterol Cal: 46 mg/dL — ABNORMAL HIGH (ref 5–40)

## 2022-05-09 ENCOUNTER — Telehealth: Payer: Self-pay

## 2022-05-09 ENCOUNTER — Other Ambulatory Visit: Payer: Self-pay

## 2022-05-09 MED ORDER — ICOSAPENT ETHYL 1 G PO CAPS: 2.0000 g | ORAL_CAPSULE | Freq: Two times a day (BID) | ORAL | 3 refills | Status: DC

## 2022-05-09 NOTE — Telephone Encounter (Signed)
Wife called for lab results. Shared provider's note with wife.    Triglycerides are still high at 283, need to be under 150. Average sugar is also high at 146 which is in diabetic range.  Need to stop OTC fish oil and start generic icosapent ethyl (Vascepa) 1,'000mg'$ , 2 caps twice a day.  Please send in prescription for #120 with 3 refills.  Need to strictly avoid sweets and starchy foods.  Please schedule follow up to check A1c and lipids in 3 months.   Wife will help pt to make dietary changes. Pt may need a nutritionist consult to help understand what he should be eating.  Pt will pick up prescription.   Make follow up appt for Jan 5th. Please let pt know if these labs need to be fasting.

## 2022-05-29 ENCOUNTER — Telehealth: Payer: Self-pay | Admitting: Cardiovascular Disease

## 2022-05-29 NOTE — Telephone Encounter (Signed)
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 He is requesting to be worked in before then so he is able to continue driving. He is unable to do Friday due to being scheduled for a colonoscopy that day. Please advise.

## 2022-05-29 NOTE — Telephone Encounter (Signed)
Please follow up with patient in regards to scheduling

## 2022-05-29 NOTE — Telephone Encounter (Signed)
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 She says he needs this done soon, but the order is not in yet. She also would like to know if the patient could be worked in sooner than 06/20/22. He has also been added to the waitlist.

## 2022-05-29 NOTE — Telephone Encounter (Signed)
Advised wife pt would have need a follow up appointment prior to ordering test. Wife verbalized understanding and appointment move up to 11/1.

## 2022-06-02 ENCOUNTER — Encounter
Admission: RE | Disposition: A | Discharge status: Home / Self Care | Payer: Self-pay | Source: Ambulatory Visit | Attending: Gastroenterology

## 2022-06-02 ENCOUNTER — Encounter: Payer: Self-pay | Admitting: *Deleted

## 2022-06-02 ENCOUNTER — Ambulatory Visit
Admission: RE | Admit: 2022-06-02 | Discharge: 2022-06-02 | Disposition: A | Discharge status: Home / Self Care | Payer: PPO | Source: Ambulatory Visit | Attending: Gastroenterology | Admitting: Gastroenterology

## 2022-06-02 ENCOUNTER — Ambulatory Visit: Payer: PPO | Admitting: Anesthesiology

## 2022-06-02 DIAGNOSIS — I252 Old myocardial infarction: Secondary | ICD-10-CM | POA: Diagnosis not present

## 2022-06-02 DIAGNOSIS — D128 Benign neoplasm of rectum: Secondary | ICD-10-CM | POA: Diagnosis not present

## 2022-06-02 DIAGNOSIS — K640 First degree hemorrhoids: Secondary | ICD-10-CM | POA: Diagnosis not present

## 2022-06-02 DIAGNOSIS — I10 Essential (primary) hypertension: Secondary | ICD-10-CM | POA: Insufficient documentation

## 2022-06-02 DIAGNOSIS — Z86010 Personal history of colonic polyps: Secondary | ICD-10-CM | POA: Insufficient documentation

## 2022-06-02 DIAGNOSIS — Z951 Presence of aortocoronary bypass graft: Secondary | ICD-10-CM | POA: Diagnosis not present

## 2022-06-02 DIAGNOSIS — E785 Hyperlipidemia, unspecified: Secondary | ICD-10-CM | POA: Diagnosis not present

## 2022-06-02 DIAGNOSIS — K621 Rectal polyp: Secondary | ICD-10-CM | POA: Diagnosis not present

## 2022-06-02 DIAGNOSIS — Z96641 Presence of right artificial hip joint: Secondary | ICD-10-CM | POA: Diagnosis not present

## 2022-06-02 DIAGNOSIS — I255 Ischemic cardiomyopathy: Secondary | ICD-10-CM | POA: Insufficient documentation

## 2022-06-02 DIAGNOSIS — D126 Benign neoplasm of colon, unspecified: Secondary | ICD-10-CM | POA: Diagnosis not present

## 2022-06-02 DIAGNOSIS — I2510 Atherosclerotic heart disease of native coronary artery without angina pectoris: Secondary | ICD-10-CM | POA: Diagnosis not present

## 2022-06-02 DIAGNOSIS — D122 Benign neoplasm of ascending colon: Secondary | ICD-10-CM | POA: Diagnosis not present

## 2022-06-02 DIAGNOSIS — Z1211 Encounter for screening for malignant neoplasm of colon: Secondary | ICD-10-CM | POA: Insufficient documentation

## 2022-06-02 HISTORY — PX: COLONOSCOPY WITH PROPOFOL: SHX5780

## 2022-06-02 HISTORY — DX: Personal history of urinary calculi: Z87.442

## 2022-06-02 SURGERY — COLONOSCOPY WITH PROPOFOL
Anesthesia: General

## 2022-06-02 MED ORDER — SODIUM CHLORIDE 0.9 % IV SOLN
INTRAVENOUS | Status: DC
  Administered 2022-06-02: 1000 mL via INTRAVENOUS

## 2022-06-02 MED ORDER — PROPOFOL 500 MG/50ML IV EMUL
INTRAVENOUS | Status: DC | PRN
  Administered 2022-06-02: 120 ug/kg/min via INTRAVENOUS
  Administered 2022-06-02: 60 mg via INTRAVENOUS

## 2022-06-02 NOTE — Op Note (Signed)
[facility] Patient Name: [name] Procedure Date: 06/02/2022 12:37 PM MRN: [id number] Account #: [id number] Date of Birth: 29-Mar-1951 Admit Type: Outpatient Age: 71 Room: [facility] ENDO ROOM 3 Gender: Male Note Status: Finalized Instrument Name: Park Meo [id number] Procedure:             Colonoscopy Indications:           Surveillance: Personal history of adenomatous polyps                         on last colonoscopy > 5 years ago Providers:             Andrey Farmer MD, MD Medicines:             Monitored Anesthesia Care Complications:         No immediate complications. Estimated blood loss:                         Minimal. Procedure:             Pre-Anesthesia Assessment:                        - Prior to the procedure, a History and Physical was                         performed, and patient medications and allergies were                         reviewed. The patient is competent. The risks and                         benefits of the procedure and the sedation options and                         risks were discussed with the patient. All questions                         were answered and informed consent was obtained.                         Patient identification and proposed procedure were                         verified by the physician, the nurse, the                         anesthesiologist, the anesthetist and the technician                         in the endoscopy suite. Mental Status Examination:                         alert and oriented. Airway Examination: normal                         oropharyngeal airway and neck mobility. Respiratory                         Examination: clear to auscultation. CV Examination:  normal. Prophylactic Antibiotics: The patient does not                         require prophylactic antibiotics. Prior                         Anticoagulants: The patient has taken no  anticoagulant                         or antiplatelet agents. ASA Grade Assessment: II - A                         patient with mild systemic disease. After reviewing                         the risks and benefits, the patient was deemed in                         satisfactory condition to undergo the procedure. The                         anesthesia plan was to use monitored anesthesia care                         (MAC). Immediately prior to administration of                         medications, the patient was re-assessed for adequacy                         to receive sedatives. The heart rate, respiratory                         rate, oxygen saturations, blood pressure, adequacy of                         pulmonary ventilation, and response to care were                         monitored throughout the procedure. The physical                         status of the patient was re-assessed after the                         procedure.                        After obtaining informed consent, the colonoscope was                         passed under direct vision. Throughout the procedure,                         the patient's blood pressure, pulse, and oxygen                         saturations were monitored continuously. The  Colonoscope was introduced through the anus and                         advanced to the the cecum, identified by appendiceal                         orifice and ileocecal valve. The colonoscopy was                         performed without difficulty. The patient tolerated                         the procedure well. The quality of the bowel                         preparation was good. The ileocecal valve, appendiceal                         orifice, and rectum were photographed. Findings:      The perianal and digital rectal examinations were normal.      Three sessile polyps were found in the hepatic flexure and ascending       colon. The  polyps were 2 to 4 mm in size. These polyps were removed with       a cold snare. Resection and retrieval were complete. Estimated blood       loss was minimal.      A 2 mm polyp was found in the rectum. The polyp was sessile. The polyp       was removed with a cold snare. Resection and retrieval were complete.       Estimated blood loss was minimal.      Internal hemorrhoids were found during retroflexion. The hemorrhoids       were Grade I (internal hemorrhoids that do not prolapse).      The exam was otherwise without abnormality on direct and retroflexion       views. Impression:            - Three 2 to 4 mm polyps at the hepatic flexure and in                         the ascending colon, removed with a cold snare.                         Resected and retrieved.                        - One 2 mm polyp in the rectum, removed with a cold                         snare. Resected and retrieved.                        - Internal hemorrhoids.                        - The examination was otherwise normal on direct and                         retroflexion views. Recommendation:        -  Discharge patient to home.                        - Resume previous diet.                        - Continue present medications.                        - Await pathology results.                        - Repeat colonoscopy for surveillance based on                         pathology results.                        - Return to referring physician as previously                         scheduled. Procedure Code(s):     --- Professional ---                        [phone], Colonoscopy, flexible; with removal of                         tumor(s), polyp(s), or other lesion(s) by snare                         technique Diagnosis Code(s):     --- Professional ---                        Z86.010, Personal history of colonic polyps                        D12.3, Benign neoplasm of transverse colon (hepatic                          flexure or splenic flexure)                        D12.2, Benign neoplasm of ascending colon                        D12.8, Benign neoplasm of rectum                        K64.0, First degree hemorrhoids CPT copyright 2022 American Medical Association. All rights reserved. The codes documented in this report are preliminary and upon coder review may  be revised to meet current compliance requirements. Andrey Farmer MD, MD 06/02/2022 1:06:57 PM Number of Addenda: 0 Note Initiated On: 06/02/2022 12:37 PM Scope Withdrawal Time: 0 hours 8 minutes 51 seconds  Total Procedure Duration: 0 hours 13 minutes 56 seconds  Estimated Blood Loss:  Estimated blood loss was minimal.      [facility]

## 2022-06-02 NOTE — Interval H&P Note (Signed)
History and Physical Interval Note:  06/02/2022 12:43 PM  [name]  has presented today for surgery, with the diagnosis of HX OF ADENOMATOUS POLYP OF COLON.  The various methods of treatment have been discussed with the patient and family. After consideration of risks, benefits and other options for treatment, the patient has consented to  Procedure(s): COLONOSCOPY WITH PROPOFOL (N/A) as a surgical intervention.  The patient's history has been reviewed, patient examined, no change in status, stable for surgery.  I have reviewed the patient's chart and labs.  Questions were answered to the patient's satisfaction.     Lesly Rubenstein  Ok to proceed with colonoscopy

## 2022-06-02 NOTE — H&P (Signed)
Outpatient short stay form Pre-procedure 06/02/2022  Lesly Rubenstein, MD  Primary Physician: Birdie Sons, MD  Reason for visit:  Surveillance  History of present illness:    71 y/o gentleman with history of hypertension and hyperlipidemia here for surveillance colonoscopy. Last colonoscopy in 2012 with adenomatous polyp. No blood thinners. No family history of GI malignancies. No significant abdominal surgeries.    Current Facility-Administered Medications:    0.9 %  sodium chloride infusion, , Intravenous, Continuous, Ashante Yellin, Hilton Cork, MD, Last Rate: 20 mL/hr at 06/02/22 1227, 1,000 mL at 06/02/22 1227  Medications Prior to Admission  Medication Sig Dispense Refill Last Dose   aspirin 81 MG EC tablet Take 1 tablet (81 mg total) by mouth daily.   Past Week   atorvastatin (LIPITOR) 80 MG tablet Take 1 tablet (80 mg total) by mouth daily. 90 tablet 2 Past Week   icosapent Ethyl (VASCEPA) 1 g capsule Take 2 capsules (2 g total) by mouth 2 (two) times daily. 120 capsule 3 Past Week   lisinopril (ZESTRIL) 40 MG tablet TAKE 1 TABLET BY MOUTH ONCE DAILY PATIENT 90 tablet 3 Past Week   Multiple Vitamin (MULTIVITAMIN) tablet Take 1 tablet by mouth every evening.    Past Week   ENSURE PLUS (ENSURE PLUS) LIQD Take 237 mLs by mouth daily.    at prn     Allergies  Allergen Reactions   Lovastatin Other (See Comments)    Chest pain   Pravastatin Other (See Comments)    Nose bleed     Past Medical History:  Diagnosis Date   CAD (coronary artery disease)    a. CT scan 01/02/2015: extensive coronary atherosclerosis;  b. 04/2015 Inf STEMI/Cath/CABG x 4: LIMA->LAD, VG->Diag, VG->OM2, VG->RPL; c. 09/2015 MV: EF 55-65%, infsept/inf infarct w/o ischemia->Low Risk.   Colon polyps    Complete heart block ([place])    a. 04/2015 in setting of inferior STEMI-->resolved.   Difficult intubation    History of kidney stones    Hyperlipidemia    Hypertension    Ischemic cardiomyopathy    a.  04/2015 LV gram: EF 35-40%; b. 09/2015 MV: EF 55-65%.   MI (myocardial infarction) ([place]) 04/2015   Postoperative atrial fibrillation ([place])    a. 04/2015 Post-op CABG AF/AFlutter--> converted with rapid atrial pacing and amio; b. CHA2DS2VASc = 3->Coumadin.   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    Physical Exam  Gen: Alert, oriented. Appears stated age.  HEENT:PERRLA. Lungs: No respiratory distress CV: RRR Abd: soft, benign, no masses Ext: No edema    Planned procedures: Proceed with colonoscopy. The patient understands the nature of the planned procedure, indications, risks, alternatives and potential complications including but not limited to bleeding, infection, perforation, damage to internal organs and possible oversedation/side effects from anesthesia. The patient agrees and gives consent to proceed.  Please refer to procedure notes for findings, recommendations and patient disposition/instructions.     Lesly Rubenstein, MD [facility]

## 2022-06-02 NOTE — Anesthesia Preprocedure Evaluation (Signed)
Anesthesia Evaluation  Patient identified by MRN, date of birth, ID band Patient awake    Reviewed: Allergy & Precautions, NPO status , Patient's Chart, lab work & pertinent test results  History of Anesthesia Complications Negative for: history of anesthetic complications  Airway Mallampati: III  TM Distance: <3 FB Neck ROM: full    Dental  (+) Chipped, Poor Dentition, Missing   Pulmonary neg pulmonary ROS, neg shortness of breath,    Pulmonary exam normal        Cardiovascular Exercise Tolerance: Good hypertension, + CAD, + Past MI and + CABG  + dysrhythmias Atrial Fibrillation      Neuro/Psych  Neuromuscular disease negative psych ROS   GI/Hepatic negative GI ROS, Neg liver ROS, neg GERD  ,  Endo/Other  negative endocrine ROS  Renal/GU Renal disease  negative genitourinary   Musculoskeletal   Abdominal   Peds  Hematology negative hematology ROS (+)   Anesthesia Other Findings Past Medical History: No date: CAD (coronary artery disease)     Comment:  a. CT scan 01/02/2015: extensive coronary               atherosclerosis;  b. 04/2015 Inf STEMI/Cath/CABG x 4:               LIMA->LAD, VG->Diag, VG->OM2, VG->RPL; c. 09/2015 MV: EF               55-65%, infsept/inf infarct w/o ischemia->Low Risk. No date: Colon polyps No date: Complete heart block ([place])     Comment:  a. 04/2015 in setting of inferior STEMI-->resolved. No date: Difficult intubation No date: History of kidney stones No date: Hyperlipidemia No date: Hypertension No date: Ischemic cardiomyopathy     Comment:  a. 04/2015 LV gram: EF 35-40%; b. 09/2015 MV: EF 55-65%. 04/2015: MI (myocardial infarction) ([place]) No date: Postoperative atrial fibrillation ([place])     Comment:  a. 04/2015 Post-op CABG AF/AFlutter--> converted with               rapid atrial pacing and amio; b. CHA2DS2VASc =               3->Coumadin. 04/2015: S/P CABG x 4 04/29/2015:  Unilateral inguinal hernia without obstruction or gangrene 01/12/2015: Ureterolithiasis  Past Surgical History: 05/03/2015: CARDIAC CATHETERIZATION; N/A     Comment:  Procedure: Left Heart Cath and Coronary Angiography;                Surgeon: Wellington Hampshire, MD;  Location: [facility]               [facility];  Service: Cardiovascular;  Laterality: N/A; No date: CATARACT EXTRACTION, BILATERAL     Comment:  [place] 05/03/2015: CORONARY ARTERY BYPASS GRAFT; N/A     Comment:  Procedure: CORONARY ARTERY BYPASS GRAFTING (CABG) times               four using left internal mammary artery and right               saphenous vein.;  Surgeon: [name], MD;                Location: [facility];  Service: Open Heart Surgery;                Laterality: N/A; 09/16/2019: CYSTOSCOPY WITH STENT PLACEMENT; Left     Comment:  Procedure: CYSTOSCOPY WITH STENT PLACEMENT;  Surgeon:  Stoioff, Ronda Fairly, MD;  Location: [facility];  Service:               Urology;  Laterality: Left; 09/16/2019: CYSTOSCOPY/RETROGRADE/URETEROSCOPY; Left     Comment:  Procedure: CYSTOSCOPY/RETROGRADE/URETEROSCOPY;  Surgeon:              Abbie Sons, MD;  Location: [facility];  Service:               Urology;  Laterality: Left; 10/07/2019: CYSTOSCOPY/URETEROSCOPY/HOLMIUM LASER/STENT PLACEMENT;  Left     Comment:  Procedure: CYSTOSCOPY/URETEROSCOPY/HOLMIUM LASER/STENT               Exhange;  Surgeon: Abbie Sons, MD;  Location: [facility]               ORS;  Service: Urology;  Laterality: Left; No date: EYE SURGERY No date: HERNIA REPAIR     Comment:  ventral hernia repair No date: JOINT REPLACEMENT No date: KNEE SURGERY; Left     Comment:  arthroscopic knee surgery No date: NECK SURGERY     Comment:  Spinal fusion- neck. Dr. Sherley Bounds of [facility]               ([facility]) 02/04/2014: right hip replacement; Right     Comment:  Dr. Sabra Heck 05/03/2015: TEE WITHOUT CARDIOVERSION;  N/A     Comment:  Procedure: TRANSESOPHAGEAL ECHOCARDIOGRAM (TEE);                Surgeon: [name], MD;  Location: [facility];                Service: Open Heart Surgery;  Laterality: N/A;  BMI    Body Mass Index: 31.05 kg/m      Reproductive/Obstetrics negative OB ROS                             Anesthesia Physical Anesthesia Plan  ASA: 3  Anesthesia Plan: General   Post-op Pain Management:    Induction: Intravenous  PONV Risk Score and Plan: Propofol infusion and TIVA  Airway Management Planned: Natural Airway and Nasal Cannula  Additional Equipment:   Intra-op Plan:   Post-operative Plan:   Informed Consent: I have reviewed the patients History and Physical, chart, labs and discussed the procedure including the risks, benefits and alternatives for the proposed anesthesia with the patient or authorized representative who has indicated his/her understanding and acceptance.     Dental Advisory Given  Plan Discussed with: Anesthesiologist, CRNA and Surgeon  Anesthesia Plan Comments: (Patient consented for risks of anesthesia including but not limited to:  - adverse reactions to medications - risk of airway placement if required - damage to eyes, teeth, lips or other oral mucosa - nerve damage due to positioning  - sore throat or hoarseness - Damage to heart, brain, nerves, lungs, other parts of body or loss of life  Patient voiced understanding.)        Anesthesia Quick Evaluation

## 2022-06-02 NOTE — Transfer of Care (Signed)
Immediate Anesthesia Transfer of Care Note  Patient: [name]  Procedure(s) Performed: COLONOSCOPY WITH PROPOFOL  Patient Location: PACU  Anesthesia Type:General  Level of Consciousness: awake  Airway & Oxygen Therapy: Patient Spontanous Breathing and Patient connected to nasal cannula oxygen  Post-op Assessment: Report given to RN and Post -op Vital signs reviewed and stable  Post vital signs: Reviewed and stable  Last Vitals:  Vitals Value Taken Time  BP 126/75 06/02/22 1307  Temp 36.1 C 06/02/22 1307  Pulse 66 06/02/22 1308  Resp 19 06/02/22 1308  SpO2 99 % 06/02/22 1308  Vitals shown include unvalidated device data.  Last Pain:  Vitals:   06/02/22 1307  TempSrc: Temporal  PainSc: Asleep         Complications: No notable events documented.

## 2022-06-03 NOTE — Anesthesia Postprocedure Evaluation (Signed)
Anesthesia Post Note  Patient: [name]  Procedure(s) Performed: COLONOSCOPY WITH PROPOFOL  Patient location during evaluation: Endoscopy Anesthesia Type: General Level of consciousness: awake and alert Pain management: pain level controlled Vital Signs Assessment: post-procedure vital signs reviewed and stable Respiratory status: spontaneous breathing, nonlabored ventilation, respiratory function stable and patient connected to nasal cannula oxygen Cardiovascular status: blood pressure returned to baseline and stable Postop Assessment: no apparent nausea or vomiting Anesthetic complications: no   No notable events documented.   Last Vitals:  Vitals:   06/02/22 1317 06/02/22 1323  BP: 138/69 (!) 156/83  Pulse: (!) 59 (!) 38  Resp: 19 19  Temp:    SpO2: 98% 99%    Last Pain:  Vitals:   06/02/22 1323  TempSrc:   PainSc: 0-No pain                 Precious Haws Chanetta Moosman

## 2022-06-05 LAB — SURGICAL PATHOLOGY

## 2022-06-07 ENCOUNTER — Encounter: Payer: Self-pay | Admitting: Medical

## 2022-06-07 ENCOUNTER — Ambulatory Visit: Payer: PPO | Attending: Medical | Admitting: Medical

## 2022-06-07 VITALS — BP 122/66 | HR 70 | Ht 66.0 in | Wt 200.4 lb

## 2022-06-07 DIAGNOSIS — E782 Mixed hyperlipidemia: Secondary | ICD-10-CM

## 2022-06-07 DIAGNOSIS — I10 Essential (primary) hypertension: Secondary | ICD-10-CM | POA: Diagnosis not present

## 2022-06-07 DIAGNOSIS — I2510 Atherosclerotic heart disease of native coronary artery without angina pectoris: Secondary | ICD-10-CM

## 2022-06-07 MED ORDER — NITROGLYCERIN 0.4 MG SL SUBL: 0.4000 mg | SUBLINGUAL_TABLET | SUBLINGUAL | 3 refills | Status: DC | PRN

## 2022-06-07 NOTE — Patient Instructions (Signed)
Medication Instructions:  - Your physician has recommended you make the following change in your medication:   1) START Nitroglycerin 0.4 mg sublingual tablets as needed-  - If you experience chest pain: Place 1 nitroglycerin under your tongue, while sitting. If no relief of pain, may repeat 1 tablet every 5 minutes up to 3 tablets over 15 minutes. If no relief call 911. If you have dizziness/lightheadedness while taking nitroglycerin, stop taking and call 911.     *If you need a refill on your cardiac medications before your next appointment, please call your pharmacy*   Lab Work: - none ordered  If you have labs (blood work) drawn today and your tests are completely normal, you will receive your results only by: [place] (if you have MyChart) OR A paper copy in the mail If you have any lab test that is abnormal or we need to change your treatment, we will call you to review the results.   Testing/Procedures: - Your physician has requested that you have an exercise tolerance test.   - you may eat a light breakfast/ lunch prior to your test - no caffeine for 24 hours prior to your test (coffee, tea, soft drinks, or chocolate)  - no smoking/ vaping for 4 hours prior to your test - you may take your regular medications the day of your test except - bring any inhalers with you to your test - wear comfortable clothing & tennis/ non-skid shoes to walk on the treadmill    Follow-Up: At [facility], you and your health needs are our priority.  As part of our continuing mission to provide you with exceptional heart care, we have created designated Provider Care Teams.  These Care Teams include your primary Cardiologist (physician) and Advanced Practice Providers (APPs -  Physician Assistants and Nurse Practitioners) who all work together to provide you with the care you need, when you need it.  We recommend signing up for the patient portal called "MyChart".  Sign up  information is provided on this After Visit Summary.  MyChart is used to connect with patients for Virtual Visits (Telemedicine).  Patients are able to view lab/test results, encounter notes, upcoming appointments, etc.  Non-urgent messages can be sent to your provider as well.   To learn more about what you can do with MyChart, go to [email or web address].    Your next appointment:    1 year(s)  The format for your next appointment:   In Person  Provider:   You may see Kathlyn [place], MD or one of the following Advanced Practice Providers on your designated Care Team:    Cadence Kathlen Mody, [place]    Other Instructions  Exercise Stress Test An exercise stress test is a test to check how your heart works during exercise. You will need to walk on a treadmill or ride an exercise bike for this test. An electrocardiogram (ECG) will record your heartbeat when you are at rest and when you are exercising. You may have an ultrasound or nuclear scan after the exercise test. The test is done to check for coronary artery disease (CAD). It is also done to: See how well you can exercise. Watch for high blood pressure during exercise. Test how well you can exercise after treatment. Check the blood flow to your arms and legs. If your test result is not normal, more testing may be needed. Tell a doctor about: Any allergies you have. All medicines you are taking. This includes vitamins, herbs,  eye drops, creams, and over-the-counter medicines. Any surgeries you have had. Tell your doctor if you have a pacemaker or a defibrillator called an ICD. Any bleeding problems you have. Any medical conditions you have. Whether you are pregnant or may be pregnant. What are the risks? Pain or pressure in the following areas: Chest. Jaw or neck. Between your shoulder blades. Down your left arm. Legs. Feeling dizzy or light-headed. Shortness of breath. Irregular heartbeat. Feeling like you may vomit (nausea) or  vomiting. What happens before the test? Follow instructions from your doctor about what you cannot eat or drink. Do not have any drinks or foods that have caffeine in them for 24 hours before the test, or as told by your doctor. This includes coffee, tea (even decaf tea), sodas, chocolate, and cocoa. Ask your doctor about changing or stopping: Over-the-counter medicines. Vitamins, herbs, and supplements. Your normal medicines. This is important if: You take diabetes medicines. Ask how to take insulin or pills. You may be told to change your insulin dose the morning of the test. You take beta-blocker medicines. These medicines may cause problems in your test. You may be told to stop taking them 24 hours before the test. You wear a nitroglycerin patch. The patch may need to be taken off before the test. Do not smoke or use any products that contain nicotine or tobacco for 4 hours before the test. If you need help quitting, ask your doctor. If you use an inhaler, bring it with you to the test. Do not put lotions, powders, creams, or oils on your chest before the test. Wear comfortable shoes and loose-fitting clothing. What happens during the test?  Patches (electrodes) will be put on your chest. Wires will be connected to the patches. The wires will send signals to a machine to record your heartbeat. Your heart rate will be watched while you are resting and while you are exercising. Your blood pressure and oxygen levels will also be watched during the test. You will walk on a treadmill or use an exercise bike. If you cannot use these, you may be asked to turn a crank with your hands. The activity will get harder and will raise your heart rate. You may be asked to breathe into a tube a few times during the test. This measures the gases that you breathe out. You will be asked how you are feeling throughout the test. You will exercise until your heart reaches a target heart rate. You will stop  early if: You have chest pain. You feel dizzy. You are out of breath. You are too tired to keep going. Your blood pressure is too high or too low. You have an irregular heartbeat. You have pain or aching in your arms or legs. The test may vary among doctors and hospitals. What can I expect after the test? You will be monitored until you leave the hospital or clinic. This includes checking your blood pressure, heart rate, breathing rate, and blood oxygen level. You may return to your normal diet and activities as told by your doctor. It is up to you to get the results of your test. Ask how to get your results when they are ready. Summary An exercise stress test is a test to check how your heart works during exercise. This test is done to check for coronary artery disease. Your heart rate will be watched while you are resting and while you are exercising. Follow instructions from your doctor about what you cannot  eat or drink before the test. This information is not intended to replace advice given to you by your health care provider. Make sure you discuss any questions you have with your health care provider. Document Revised: 06/07/2021 Document Reviewed: 06/07/2021 Elsevier Patient Education  [place]

## 2022-06-07 NOTE — Progress Notes (Signed)
Cardiology Office Note:    Date:  06/07/2022   ID:  [name], DOB 1950-10-21, MRN [id number]  PCP:  Birdie Sons, MD  [facility] Cardiologist:  Kathlyn Sacramento, MD  [facility] Electrophysiologist:  None   Referring MD: Birdie Sons, MD   Chief Complaint: 1 year follow-up  History of Present Illness:    [name] is a 71 y.o. male with a hx of CAD status post CABG September 2016, mixed hyperlipidemia, and ischemic cardiomyopathy with improved EF who presents for 1 year follow-up.  Patient presented with inferior STEMI 2016.  Cardiac cath showed severe three-vessel CAD.  The culprit was an occluded distal RCA with heavy thrombus burden.  EF was 35 to 40%.  Aspiration thrombectomy and balloon angioplasty was performed with restoration of TIMI-3 flow.  However, the results were suboptimal and the patient underwent urgent CABG by Dr. Roxan Hockey.  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  Exercise stress test in 2022 was normal.  Last seen 05/19/2021 and was doing well from a cardiac perspective.  Today, the patient reports he is overall doing well from a cardiac perspective. He recently cut out sweets. PCP started Vascepa. He does no formal activity. He does walk a lot, yard work. No chest pain or shortness of breath. No lower leg edema, orthopnea, or pnd. No palpitations, lightheadedness or dizziness. Needs DOT clearance. He does not have SL NTG.  Past Medical History:  Diagnosis Date   CAD (coronary artery disease)    a. CT scan 01/02/2015: extensive coronary atherosclerosis;  b. 04/2015 Inf STEMI/Cath/CABG x 4: LIMA->LAD, VG->Diag, VG->OM2, VG->RPL; c. 09/2015 MV: EF 55-65%, infsept/inf infarct w/o ischemia->Low Risk.    Colon polyps    Complete heart block ([place])    a. 04/2015 in setting of inferior STEMI-->resolved.   Difficult intubation    History of kidney stones    Hyperlipidemia    Hypertension    Ischemic cardiomyopathy    a. 04/2015 LV gram: EF 35-40%; b. 09/2015 MV: EF 55-65%.   MI (myocardial infarction) ([place]) 04/2015   Postoperative atrial fibrillation ([place])    a. 04/2015 Post-op CABG AF/AFlutter--> converted with rapid atrial pacing and amio; b. CHA2DS2VASc = 3->Coumadin.   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   Unilateral inguinal hernia without obstruction or gangrene 04/29/2015   Ureterolithiasis 01/12/2015    Past Surgical History:  Procedure Laterality Date   CARDIAC CATHETERIZATION N/A 05/03/2015   Procedure: Left Heart Cath and Coronary Angiography;  Surgeon: Wellington Hampshire, MD;  Location: [place] [facility];  Service: Cardiovascular;  Laterality: N/A;   CATARACT EXTRACTION, [place]   COLONOSCOPY WITH PROPOFOL N/A 06/02/2022   Procedure: COLONOSCOPY WITH PROPOFOL;  Surgeon: Lesly Rubenstein, MD;  Location: [facility];  Service: Endoscopy;  Laterality: N/A;   CORONARY ARTERY BYPASS GRAFT N/A 05/03/2015   Procedure: CORONARY ARTERY BYPASS GRAFTING (CABG) times four using left internal mammary artery and right saphenous vein.;  Surgeon: [name], MD;  Location: [place];  Service: Open Heart Surgery;  Laterality: N/A;   CYSTOSCOPY WITH STENT PLACEMENT Left 09/16/2019   Procedure: CYSTOSCOPY WITH STENT PLACEMENT;  Surgeon: Abbie Sons, MD;  Location: [facility];  Service: Urology;  Laterality: Left;   CYSTOSCOPY/RETROGRADE/URETEROSCOPY Left 09/16/2019   Procedure: CYSTOSCOPY/RETROGRADE/URETEROSCOPY;  Surgeon: Abbie Sons, MD;  Location: [facility];  Service: Urology;  Laterality: Left;   CYSTOSCOPY/URETEROSCOPY/HOLMIUM LASER/STENT PLACEMENT Left 10/07/2019   Procedure: CYSTOSCOPY/URETEROSCOPY/HOLMIUM LASER/STENT Exhange;  Surgeon: Abbie Sons, MD;   Location: [facility];  Service: Urology;  Laterality: Left;   EYE SURGERY     HERNIA REPAIR     ventral hernia repair   JOINT REPLACEMENT     KNEE SURGERY Left    arthroscopic knee surgery   NECK SURGERY     Spinal fusion- neck. Dr. Sherley Bounds of [place] ([facility])   right hip replacement Right 02/04/2014   Dr. Sabra Heck   TEE WITHOUT CARDIOVERSION N/A 05/03/2015   Procedure: TRANSESOPHAGEAL ECHOCARDIOGRAM (TEE);  Surgeon: [name], MD;  Location: [place];  Service: Open Heart Surgery;  Laterality: N/A;    Current Medications: Current Meds  Medication Sig   aspirin 81 MG EC tablet Take 1 tablet (81 mg total) by mouth daily.   atorvastatin (LIPITOR) 80 MG tablet Take 1 tablet (80 mg total) by mouth daily.   ENSURE PLUS (ENSURE PLUS) LIQD Take 237 mLs by mouth daily.   icosapent Ethyl (VASCEPA) 1 g capsule Take 2 capsules (2 g total) by mouth 2 (two) times daily.   lisinopril (ZESTRIL) 40 MG tablet TAKE 1 TABLET BY MOUTH ONCE DAILY PATIENT   Multiple Vitamin (MULTIVITAMIN) tablet Take 1 tablet by mouth every evening.    nitroGLYCERIN (NITROSTAT) 0.4 MG SL tablet Place 1 tablet (0.4 mg total) under the tongue every 5 (five) minutes as needed for chest pain.     Allergies:   Lovastatin and Pravastatin   Social History   Socioeconomic History   Marital status: Married    Spouse name: Not on file   Number of children: 1   Years of education: Not on file   Highest education level: Associate degree: occupational, Hotel manager, or vocational program  Occupational History   Occupation: Administrator    Comment: part time  Tobacco Use   Smoking status: Never   Smokeless tobacco: Never  Vaping Use   Vaping Use: Never used  Substance and Sexual Activity   Alcohol use: Not Currently    Alcohol/week: 0.0 standard drinks of alcohol    Comment: rarely   Drug use: No   Sexual activity: Yes    Birth control/protection: None  Other Topics Concern   Not on file   Social History Narrative   Not on file   Social Determinants of Health   Financial Resource Strain: Low Risk  (04/15/2020)   Overall Financial Resource Strain (CARDIA)    Difficulty of Paying Living Expenses: Not hard at all  Food Insecurity: No Food Insecurity (04/15/2020)   Hunger Vital Sign    Worried About Running Out of Food in the Last Year: Never true    [place] in the Last Year: Never true  Transportation Needs: No Transportation Needs (04/15/2020)   PRAPARE - Hydrologist (Medical): No    Lack of Transportation (Non-Medical): No  Physical Activity: Inactive (04/15/2020)   Exercise Vital Sign    Days of Exercise per Week: 0 days    Minutes of Exercise per Session: 0 min  Stress: No Stress Concern Present (04/15/2020)   [place]    Feeling of Stress : Only a little  Social Connections: Moderately Isolated (04/15/2020)  Social Licensed conveyancer [NHANES]    Frequency of Communication with Friends and Family: More than three times a week    Frequency of Social Gatherings with Friends and Family: More than three times a week    Attends Religious Services: Never    Marine scientist or Organizations: No    Attends Music therapist: Never    Marital Status: Married     Family History: The patient's family history includes CAD in his father, maternal uncle, and paternal uncle; Heart Problems in his sister; Heart attack in his brother, brother, brother, brother, father, and sister.  ROS:   Please see the history of present illness.     All other systems reviewed and are negative.  EKGs/Labs/Other Studies Reviewed:    The following studies were reviewed today:  ETT 05/2021   Baseline EKG shows normal sinus rhythm with frequent PVCs and nonspecific T wave changes.   Patient demonstrates good exercise capacity, achieving a maximum heart rate of 131  bpm and exercising for 8:59 minutes (completing stage III; 10.1 METS).  Hypertensive blood pressure response noted (max BP 241/102).  No angina reported during stress.   No significant ST segment or T wave abnormalities observed during stress.   Rare PACs observed during stress.  Rare PVCs noted during recovery.   Low risk exercise tolerance test (Duke Treadmill Score = +9).   Low risk exercise tolerance test without evidence of inducible ischemia.  Frequent PVCs observed at rest that cease with stress.  Hypertensive blood pressure response also noted.   Myoview Lexsican 2017 Narrative & Impression  No T wave inversion was noted during stress. There was no ST segment deviation noted during stress. Defect 1: There is a medium defect of severe severity present in the basal inferoseptal, basal inferior, mid inferoseptal and mid inferior location. Findings consistent with prior inferior myocardial infarction without ischemia. This is a low risk study. The left ventricular ejection fraction is normal (55-65%). excellent exercise capacity.    EKG:  EKG is ordered today.  The ekg ordered today demonstrates normal sinus rhythm, first-degree AV block, PRI 256 ms, nonspecific ST and T wave changes.  No significant change from prior.  Recent Labs: 01/20/2022: ALT 33; BUN 18; Creatinine, Ser 1.08; Hemoglobin 13.9; Platelets 207; Potassium 5.0; Sodium 139; TSH 0.866  Recent Lipid Panel    Component Value Date/Time   CHOL 151 05/05/2022 0828   TRIG 283 (H) 05/05/2022 0828   HDL 27 (L) 05/05/2022 0828   CHOLHDL 5.6 (H) 05/05/2022 0828   CHOLHDL 5.1 05/28/2020 1011   VLDL 48 (H) 05/28/2020 1011   LDLCALC 78 05/05/2022 0828     Physical Exam:    VS:  BP 122/66 (BP Location: Left Arm, Patient Position: Sitting, Cuff Size: Normal)   Pulse 70   Ht '5\' 6"'$  (1.676 m)   Wt 200 lb 6.4 oz (90.9 kg)   SpO2 96%   BMI 32.35 kg/m     Wt Readings from Last 3 Encounters:  06/07/22 200 lb 6.4 oz (90.9 kg)   06/02/22 192 lb 6.3 oz (87.3 kg)  05/05/22 201 lb (91.2 kg)     GEN:  Well nourished, well developed in no acute distress HEENT: Normal NECK: No JVD; No carotid bruits LYMPHATICS: No lymphadenopathy CARDIAC: RRR, no murmurs, rubs, gallops RESPIRATORY:  Clear to auscultation without rales, wheezing or rhonchi  ABDOMEN: Soft, non-tender, non-distended MUSCULOSKELETAL:  No edema; No deformity  SKIN: Warm and dry NEUROLOGIC:  Alert and oriented x 3 PSYCHIATRIC:  Normal affect   ASSESSMENT:    1. Coronary artery disease involving native coronary artery of native heart without angina pectoris   2. Mixed hyperlipidemia   3. Essential hypertension    PLAN:    In order of problems listed above:  CAD status post remote CABG Patient denies anginal symptoms. Patient does no formal activity, however he is active at baseline.  Patient needs DOT clearance with ETT. Continue aspirin 81 mg daily, Lipitor 80 mg daily, Vascepa 2 mg twice daily, lisinopril 40 mg daily. I will give patient sublingual nitroglycerin to use as needed chest pain. I will order exercise treadmill test.  Mixed hyperlipidemia Recent lipid panel showed triglycerides 283, HDL 27, total cholesterol 151, LDL 78.  PCP started patient on Vascepa 2 g twice daily.  Patient has made diet changes as well.  Continue Lipitor 80 mg daily and Vascepa.  PCP will continue to follow.  Can consider addition of Zetia.  Hypertension Pressure is normal today. Continue lisinopril 40 mg daily.  Disposition: Follow up in 1 year(s) with MD/APP   Shared Decision Making/Informed Consent   Shared Decision Making/Informed Consent The risks [chest pain, shortness of breath, cardiac arrhythmias, dizziness, blood pressure fluctuations, myocardial infarction, stroke/transient ischemic attack, and life-threatening complications (estimated to be 1 in 10,000)], benefits (risk stratification, diagnosing coronary artery disease, treatment guidance) and  alternatives of an exercise tolerance test were discussed in detail with [name] and he agrees to proceed.    Signed, Kasheena Sambrano Ninfa Meeker, PA-C  06/07/2022 11:18 AM    [facility]

## 2022-06-12 ENCOUNTER — Ambulatory Visit: Payer: PPO | Admitting: Urology

## 2022-06-20 ENCOUNTER — Ambulatory Visit: Payer: PPO | Admitting: Medical

## 2022-07-05 ENCOUNTER — Other Ambulatory Visit: Payer: Self-pay | Admitting: Cardiovascular Disease

## 2022-07-05 DIAGNOSIS — I10 Essential (primary) hypertension: Secondary | ICD-10-CM

## 2022-08-02 ENCOUNTER — Other Ambulatory Visit: Payer: Self-pay | Admitting: Family Medicine

## 2022-08-02 DIAGNOSIS — Z20828 Contact with and (suspected) exposure to other viral communicable diseases: Secondary | ICD-10-CM

## 2022-08-02 MED ORDER — OSELTAMIVIR PHOSPHATE 75 MG PO CAPS: 75.0000 mg | ORAL_CAPSULE | Freq: Every day | ORAL | 0 refills | Status: AC

## 2022-08-02 NOTE — Progress Notes (Signed)
Patient's spouse Dayquan Buys seen today with positive test for influenza A and requesting flu prophylaxis medication for this patient.

## 2022-08-11 ENCOUNTER — Ambulatory Visit (INDEPENDENT_AMBULATORY_CARE_PROVIDER_SITE_OTHER): Payer: PPO | Admitting: Family Medicine

## 2022-08-11 ENCOUNTER — Encounter: Payer: Self-pay | Admitting: Family Medicine

## 2022-08-11 VITALS — BP 121/69 | HR 70 | Wt 192.8 lb

## 2022-08-11 DIAGNOSIS — E782 Mixed hyperlipidemia: Secondary | ICD-10-CM | POA: Diagnosis not present

## 2022-08-11 DIAGNOSIS — R739 Hyperglycemia, unspecified: Secondary | ICD-10-CM | POA: Diagnosis not present

## 2022-08-11 NOTE — Progress Notes (Signed)
I,Sha'taria Tyson,acting as a Education administrator for [name], MD.,have documented all relevant documentation on the behalf of [name], MD,as directed by  [name], MD while in the presence of [name], MD.   Established patient visit   Patient: [name]   DOB: Sep 07, 1950   72 y.o. Male  MRN: [id number] Visit Date: 08/11/2022  Today's healthcare provider: [name], MD   Chief Complaint  Patient presents with   Diabetes   Subjective    HPI  Diabetes Mellitus Type II, Follow-up  Lab Results  Component Value Date   HGBA1C 6.7 (H) 05/05/2022   HGBA1C 6.1 (A) 10/17/2018   HGBA1C 6.2 (H) 04/05/2018   Wt Readings from Last 3 Encounters:  06/07/22 200 lb 6.4 oz (90.9 kg)  06/02/22 192 lb 6.3 oz (87.3 kg)  05/05/22 201 lb (91.2 kg)   Last seen for diabetes 3 months ago.  Management since then includes strictly avoid sweets and starchy foods . He reports excellent compliance with treatment.  Symptoms: none Current diet habits: diabetic  Pertinent Labs: Lab Results  Component Value Date   NA 139 01/20/2022   K 5.0 01/20/2022   CREATININE 1.08 01/20/2022   EGFR 73 01/20/2022   LABMICR See below: 09/15/2019     ---------------------------------------------------------------------------------------------------  Lipid/Cholesterol, Follow-up  Last lipid panel Other pertinent labs  Lab Results  Component Value Date   CHOL 151 05/05/2022   HDL 27 (L) 05/05/2022   LDLCALC 78 05/05/2022   TRIG 283 (H) 05/05/2022   CHOLHDL 5.6 (H) 05/05/2022   Lab Results  Component Value Date   ALT 33 01/20/2022   AST 25 01/20/2022   PLT 207 01/20/2022   TSH 0.866 01/20/2022     He was last seen for this 3 months ago.  Management since that visit includes stop OTC fish oil and start generic icosapent ethyl (Vascepa) 1,'000mg'$ , 2 caps twice a day .  He reports excellent compliance with treatment. He is not having side effects.  Symptoms:none  The 10-year  ASCVD risk score (Arnett DK, et al., 2019) is: 26.1%  ---------------------------------------------------------------------------------------------------   Medications: Outpatient Medications Prior to Visit  Medication Sig   oseltamivir (TAMIFLU) 75 MG capsule Take 1 capsule (75 mg total) by mouth daily for 10 days. If you develop symptoms of flu, take twice daily for remainder of prescription.   aspirin 81 MG EC tablet Take 1 tablet (81 mg total) by mouth daily.   atorvastatin (LIPITOR) 80 MG tablet Take 1 tablet (80 mg total) by mouth daily.   ENSURE PLUS (ENSURE PLUS) LIQD Take 237 mLs by mouth daily.   icosapent Ethyl (VASCEPA) 1 g capsule Take 2 capsules (2 g total) by mouth 2 (two) times daily.   lisinopril (ZESTRIL) 40 MG tablet Take 1 tablet by mouth once daily   Multiple Vitamin (MULTIVITAMIN) tablet Take 1 tablet by mouth every evening.    nitroGLYCERIN (NITROSTAT) 0.4 MG SL tablet Place 1 tablet (0.4 mg total) under the tongue every 5 (five) minutes as needed for chest pain.   No facility-administered medications prior to visit.    Review of Systems  Constitutional:  Negative for appetite change, chills and fever.  Respiratory:  Negative for chest tightness, shortness of breath and wheezing.   Cardiovascular:  Negative for chest pain and palpitations.  Gastrointestinal:  Negative for abdominal pain, nausea and vomiting.       Objective    BP 121/69 (BP Location: Left Arm, Patient Position: Sitting,  Cuff Size: Normal)   Pulse 70   Wt 192 lb 12.8 oz (87.5 kg)   SpO2 100%   BMI 31.12 kg/m    Physical Exam  General appearance: Mildly obese male, cooperative and in no acute distress Head: Normocephalic, without obvious abnormality, atraumatic Respiratory: Respirations even and unlabored, normal respiratory rate Extremities: All extremities are intact.  Skin: Skin color, texture, turgor normal. No rashes seen  Psych: Appropriate mood and affect. Neurologic: Mental  status: Alert, oriented to person, place, and time, thought content appropriate.   Assessment & Plan     1. Mixed hyperlipidemia Doing well with change from OTC fish oil to Vascepa - Lipid panel  2. Hyperglycemia Has made significant improvements to diet since A1c was last checked. In September.  - HgB A1c  He is currently on Tamiflu for prophylaxis since his wife was Flu A positive last week. He declined recommend flu vaccine today.      The entirety of the information documented in the History of Present Illness, Review of Systems and Physical Exam were personally obtained by me. Portions of this information were initially documented by the CMA and reviewed by me for thoroughness and accuracy.     [name], MD  [facility] [phone] (phone) [phone] (fax)  [place]

## 2022-08-11 NOTE — Patient Instructions (Signed)
.   Please review the attached list of medications and notify my office if there are any errors.   . Please bring all of your medications to every appointment so we can make sure that our medication list is the same as yours.   

## 2022-08-12 LAB — LIPID PANEL
Chol/HDL Ratio: 5 ratio (ref 0.0–5.0)
Cholesterol, Total: 150 mg/dL (ref 100–199)
HDL: 30 mg/dL — ABNORMAL LOW (ref >39)
LDL Chol Calc (NIH): 84 mg/dL (ref 0–99)
Triglycerides: 215 mg/dL — ABNORMAL HIGH (ref 0–149)
VLDL Cholesterol Cal: 36 mg/dL (ref 5–40)

## 2022-08-12 LAB — HEMOGLOBIN A1C
Est. average glucose Bld gHb Est-mCnc: 126 mg/dL
Hgb A1c MFr Bld: 6 % — ABNORMAL HIGH (ref 4.8–5.6)

## 2022-08-24 ENCOUNTER — Ambulatory Visit: Payer: PPO | Admitting: Urology

## 2022-09-03 ENCOUNTER — Other Ambulatory Visit: Payer: Self-pay | Admitting: Cardiovascular Disease

## 2022-09-20 ENCOUNTER — Other Ambulatory Visit: Payer: Self-pay | Admitting: *Deleted

## 2022-09-20 DIAGNOSIS — N200 Calculus of kidney: Secondary | ICD-10-CM

## 2022-09-21 ENCOUNTER — Ambulatory Visit
Admission: RE | Admit: 2022-09-21 | Discharge: 2022-09-21 | Disposition: A | Discharge status: Home / Self Care | Payer: PPO | Attending: Urology | Admitting: Urology

## 2022-09-21 ENCOUNTER — Encounter: Payer: Self-pay | Admitting: Urology

## 2022-09-21 ENCOUNTER — Ambulatory Visit: Payer: PPO | Admitting: Urology

## 2022-09-21 ENCOUNTER — Ambulatory Visit
Admission: RE | Admit: 2022-09-21 | Discharge: 2022-09-21 | Disposition: A | Discharge status: Home / Self Care | Payer: PPO | Source: Ambulatory Visit | Attending: Urology | Admitting: Urology

## 2022-09-21 VITALS — BP 123/76 | HR 69 | Ht 66.0 in | Wt 192.0 lb

## 2022-09-21 DIAGNOSIS — M47816 Spondylosis without myelopathy or radiculopathy, lumbar region: Secondary | ICD-10-CM | POA: Diagnosis not present

## 2022-09-21 DIAGNOSIS — N200 Calculus of kidney: Secondary | ICD-10-CM

## 2022-09-21 DIAGNOSIS — N401 Enlarged prostate with lower urinary tract symptoms: Secondary | ICD-10-CM | POA: Diagnosis not present

## 2022-09-21 DIAGNOSIS — M1612 Unilateral primary osteoarthritis, left hip: Secondary | ICD-10-CM | POA: Diagnosis not present

## 2022-09-21 DIAGNOSIS — I878 Other specified disorders of veins: Secondary | ICD-10-CM | POA: Diagnosis not present

## 2022-09-21 NOTE — Progress Notes (Signed)
09/21/2022 9:54 AM   [name] [birth year] [id number]  Referring provider: Birdie Sons, MD [address] [place],  [place] [address]  Chief Complaint  Patient presents with   Nephrolithiasis    Urologic history: 1. Nephrolithiasis -Left ureteroscopic stone removal 10/07/2019 -Stone analysis mixed calcium oxalate -Right nephrolithiasis  HPI: 72 y.o. male presents for follow-up of nephrolithiasis.  Since last visit denies flank, abdominal, pelvic pain Stable lower urinary tract symptoms   PMH: Past Medical History:  Diagnosis Date   CAD (coronary artery disease)    a. CT scan 01/02/2015: extensive coronary atherosclerosis;  b. 04/2015 Inf STEMI/Cath/CABG x 4: LIMA->LAD, VG->Diag, VG->OM2, VG->RPL; c. 09/2015 MV: EF 55-65%, infsept/inf infarct w/o ischemia->Low Risk.   Colon polyps    Complete heart block ([place])    a. 04/2015 in setting of inferior STEMI-->resolved.   Difficult intubation    History of kidney stones    Hyperlipidemia    Hypertension    Ischemic cardiomyopathy    a. 04/2015 LV gram: EF 35-40%; b. 09/2015 MV: EF 55-65%.   MI (myocardial infarction) ([place]) 04/2015   Postoperative atrial fibrillation ([place])    a. 04/2015 Post-op CABG AF/AFlutter--> converted with rapid atrial pacing and amio; b. CHA2DS2VASc = 3->Coumadin.   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   Unilateral inguinal hernia without obstruction or gangrene 04/29/2015   Ureterolithiasis 01/12/2015    Surgical History: Past Surgical History:  Procedure Laterality Date   CARDIAC CATHETERIZATION N/A 05/03/2015   Procedure: Left Heart Cath and Coronary Angiography;  Surgeon: Wellington Hampshire, MD;  Location: [place] [facility];  Service: Cardiovascular;  Laterality: N/A;   CATARACT EXTRACTION, [place]   COLONOSCOPY WITH PROPOFOL N/A 06/02/2022   Procedure: COLONOSCOPY WITH PROPOFOL;  Surgeon: Lesly Rubenstein, MD;  Location: [facility];  Service: Endoscopy;   Laterality: N/A;   CORONARY ARTERY BYPASS GRAFT N/A 05/03/2015   Procedure: CORONARY ARTERY BYPASS GRAFTING (CABG) times four using left internal mammary artery and right saphenous vein.;  Surgeon: [name], MD;  Location: [place];  Service: Open Heart Surgery;  Laterality: N/A;   CYSTOSCOPY WITH STENT PLACEMENT Left 09/16/2019   Procedure: CYSTOSCOPY WITH STENT PLACEMENT;  Surgeon: Abbie Sons, MD;  Location: [facility];  Service: Urology;  Laterality: Left;   CYSTOSCOPY/RETROGRADE/URETEROSCOPY Left 09/16/2019   Procedure: CYSTOSCOPY/RETROGRADE/URETEROSCOPY;  Surgeon: Abbie Sons, MD;  Location: [facility];  Service: Urology;  Laterality: Left;   CYSTOSCOPY/URETEROSCOPY/HOLMIUM LASER/STENT PLACEMENT Left 10/07/2019   Procedure: CYSTOSCOPY/URETEROSCOPY/HOLMIUM LASER/STENT Exhange;  Surgeon: Abbie Sons, MD;  Location: [facility];  Service: Urology;  Laterality: Left;   EYE SURGERY     HERNIA REPAIR     ventral hernia repair   JOINT REPLACEMENT     KNEE SURGERY Left    arthroscopic knee surgery   NECK SURGERY     Spinal fusion- neck. Dr. Sherley Bounds of [place] ([facility])   right hip replacement Right 02/04/2014   Dr. Sabra Heck   TEE WITHOUT CARDIOVERSION N/A 05/03/2015   Procedure: TRANSESOPHAGEAL ECHOCARDIOGRAM (TEE);  Surgeon: [name], MD;  Location: [place];  Service: Open Heart Surgery;  Laterality: N/A;    Home Medications:  Allergies as of 09/21/2022       Reactions   Lovastatin Other (See Comments)   Chest pain   Pravastatin Other (See Comments)   Nose bleed        Medication List  Accurate as of September 21, 2022  9:54 AM. If you have any questions, ask your nurse or doctor.          aspirin EC 81 MG tablet Take 1 tablet (81 mg total) by mouth daily.   atorvastatin 80 MG tablet Commonly known as: LIPITOR Take 1 tablet by mouth once daily   Ensure Plus Liqd Take 237 mLs by mouth daily.   icosapent Ethyl 1 g  capsule Commonly known as: Vascepa Take 2 capsules (2 g total) by mouth 2 (two) times daily.   lisinopril 40 MG tablet Commonly known as: ZESTRIL Take 1 tablet by mouth once daily   multivitamin tablet Take 1 tablet by mouth every evening.   nitroGLYCERIN 0.4 MG SL tablet Commonly known as: NITROSTAT Place 1 tablet (0.4 mg total) under the tongue every 5 (five) minutes as needed for chest pain.        Allergies:  Allergies  Allergen Reactions   Lovastatin Other (See Comments)    Chest pain   Pravastatin Other (See Comments)    Nose bleed    Family History: Family History  Problem Relation Age of Onset   CAD Father    Heart attack Father    Heart Problems Sister        CABG   Heart attack Brother    Heart attack Brother    Heart attack Brother    Heart attack Brother    Heart attack Sister    CAD Maternal Uncle    CAD Paternal Uncle     Social History:  reports that he has never smoked. He has never used smokeless tobacco. He reports that he does not currently use alcohol. He reports that he does not use drugs.   Physical Exam: BP 123/76   Pulse 69   Ht 5' 6"$  (1.676 m)   Wt 192 lb (87.1 kg)   BMI 30.99 kg/m   Constitutional:  Alert and oriented, No acute distress. HEENT: [place] AT Respiratory: Normal respiratory effort, no increased work of breathing. Psychiatric: Normal mood and affect.   Pertinent Imaging: KUB performed today was personally reviewed and interpreted.  Small, stable right upper pole calcification   Assessment & Plan:    1.  Nephrolithiasis Stable right renal calculus No significant change since last visit 2 years ago Recommend KUB in 2 years with PCP and return as needed for any significant change in size or symptoms of renal colic  2. BPH with LUTS Stable, not bothersome   Abbie Sons, MD  [facility] [address], [place], [place] [address] [phone]

## 2022-09-22 ENCOUNTER — Ambulatory Visit: Payer: Self-pay

## 2022-09-22 NOTE — Telephone Encounter (Signed)
Patient's wife wanted to schedule him for appointment since he's experiencing same symptoms she has and since she's coming in today, she wanted him to come in as well.  Chief Complaint: Cough Symptoms: Sinus congestion Frequency: 2 days ago Pertinent Negatives: Patient denies SOB (that she's aware of, no mention of it to her, he's not at home at the present time) Disposition: []$ ED /[]$ Urgent Care (no appt availability in office) / [x]$ Appointment(In office/virtual)/ []$  [facility]/ []$ Home Care/ []$ Refused Recommended Disposition /[]$ [facility]/ []$  Follow-up with PCP Additional Notes: No availability with PCP, scheduled with Mardene Speak, PA at 1500 today. Patient's wife verbalized understanding. Advised to wear mask.   Reason for Disposition  [1] Continuous (nonstop) coughing interferes with work or school AND [2] no improvement using cough treatment per Care Advice  Answer Assessment - Initial Assessment Questions 1. ONSET: "When did the cough begin?"      2 days ago 2. SEVERITY: "How bad is the cough today?"       Severe 3. SPUTUM: "Describe the color of your sputum" (none, dry cough; clear, white, yellow, green)     Unsure 4. HEMOPTYSIS: "Are you coughing up any blood?" If so ask: "How much?" (flecks, streaks, tablespoons, etc.)     Unsure 5. DIFFICULTY BREATHING: "Are you having difficulty breathing?" If Yes, ask: "How bad is it?" (e.g., mild, moderate, severe)    - MILD: No SOB at rest, mild SOB with walking, speaks normally in sentences, can lie down, no retractions, pulse < 100.    - MODERATE: SOB at rest, SOB with minimal exertion and prefers to sit, cannot lie down flat, speaks in phrases, mild retractions, audible wheezing, pulse 100-120.    - SEVERE: Very SOB at rest, speaks in single words, struggling to breathe, sitting hunched forward, retractions, pulse > 120      Not sure, he's not with wife at present 6. FEVER: "Do you have a fever?" If Yes, ask:  "What is your temperature, how was it measured, and when did it start?"     No 7. CARDIAC HISTORY: "Do you have any history of heart disease?" (e.g., heart attack, congestive heart failure)      Yes 8. LUNG HISTORY: "Do you have any history of lung disease?"  (e.g., pulmonary embolus, asthma, emphysema)     No 9. OTHER SYMPTOMS: "Do you have any other symptoms?" (e.g., runny nose, wheezing, chest pain)       Nasal congestion  Protocols used: Cough - Acute Productive-A-AH

## 2022-09-26 ENCOUNTER — Ambulatory Visit: Payer: PPO | Admitting: Physician Assistant

## 2022-09-28 ENCOUNTER — Other Ambulatory Visit: Payer: Self-pay | Admitting: Family Medicine

## 2022-09-28 NOTE — Telephone Encounter (Deleted)
f °

## 2022-09-28 NOTE — Telephone Encounter (Signed)
Requested medications are due for refill today.  yes  Requested medications are on the active medications list.  yes  Last refill. 05/09/2022 #120 3 rf  Future visit scheduled.   yes  Notes to clinic.  Refill not assigned to a protocol. Please review for refill.    Requested Prescriptions  Pending Prescriptions Disp Refills   icosapent Ethyl (VASCEPA) 1 g capsule [Pharmacy Med Name: Icosapent Ethyl 1 GM Oral Capsule] 120 capsule 0    Sig: Take 2 capsules by mouth twice daily     Off-Protocol Failed - 09/28/2022  7:30 AM      Failed - Medication not assigned to a protocol, review manually.      Passed - Valid encounter within last 12 months    Recent Outpatient Visits           1 month ago Mixed hyperlipidemia   [place], Donald E, MD   4 months ago Mixed hyperlipidemia   [place], Donald E, MD   8 months ago Annual physical exam   [facility] Birdie Sons, MD   1 year ago Primary hypertension   [place], Donald E, MD   1 year ago [place] Gwyneth Sprout, FNP       Future Appointments             In 4 months Fisher, Kirstie Peri, MD [facility], [place]

## 2023-02-06 ENCOUNTER — Ambulatory Visit: Payer: PPO

## 2023-02-06 VITALS — Ht 66.0 in | Wt 189.0 lb

## 2023-02-06 DIAGNOSIS — Z0000 Encounter for general adult medical examination without abnormal findings: Secondary | ICD-10-CM

## 2023-02-06 NOTE — Patient Instructions (Signed)
[name] , Thank you for taking time to come for your Medicare Wellness Visit. I appreciate your ongoing commitment to your health goals. Please review the following plan we discussed and let me know if I can assist you in the future.   These are the goals we discussed:  Goals      Exercise 3x per week (30 min per time)     Recommend to exercise for 3 days a week for at least 30 minutes at a time.         This is a list of the screening recommended for you and due dates:  Health Maintenance  Topic Date Due   Zoster (Shingles) Vaccine (1 of 2) Never done   DTaP/Tdap/Td vaccine (2 - Td or Tdap) 06/19/2018   COVID-19 Vaccine (2 - Pfizer risk series) 05/22/2020   Flu Shot  03/08/2023   Medicare Annual Wellness Visit  02/06/2024   Colon Cancer Screening  06/02/2025   Pneumonia Vaccine  Completed   Hepatitis C Screening  Completed   HPV Vaccine  Aged Out    Advanced directives: no  Conditions/risks identified: none  Next appointment: Follow up in one year for your annual wellness visit. 02/11/2024 @ 8:45AM telephone  Preventive Care 65 Years and Older, Male  Preventive care refers to lifestyle choices and visits with your health care provider that can promote health and wellness. What does preventive care include? A yearly physical exam. This is also called an annual well check. Dental exams once or twice a year. Routine eye exams. Ask your health care provider how often you should have your eyes checked. Personal lifestyle choices, including: Daily care of your teeth and gums. Regular physical activity. Eating a healthy diet. Avoiding tobacco and drug use. Limiting alcohol use. Practicing safe sex. Taking low doses of aspirin every day. Taking vitamin and mineral supplements as recommended by your health care provider. What happens during an annual well check? The services and screenings done by your health care provider during your annual well check will depend on your  age, overall health, lifestyle risk factors, and family history of disease. Counseling  Your health care provider may ask you questions about your: Alcohol use. Tobacco use. Drug use. Emotional well-being. Home and relationship well-being. Sexual activity. Eating habits. History of falls. Memory and ability to understand (cognition). Work and work Astronomer. Screening  You may have the following tests or measurements: Height, weight, and BMI. Blood pressure. Lipid and cholesterol levels. These may be checked every 5 years, or more frequently if you are over 67 years old. Skin check. Lung cancer screening. You may have this screening every year starting at age 73 if you have a 30-pack-year history of smoking and currently smoke or have quit within the past 15 years. Fecal occult blood test (FOBT) of the stool. You may have this test every year starting at age 35. Flexible sigmoidoscopy or colonoscopy. You may have a sigmoidoscopy every 5 years or a colonoscopy every 10 years starting at age 6. Prostate cancer screening. Recommendations will vary depending on your family history and other risks. Hepatitis C blood test. Hepatitis B blood test. Sexually transmitted disease (STD) testing. Diabetes screening. This is done by checking your blood sugar (glucose) after you have not eaten for a while (fasting). You may have this done every 1-3 years. Abdominal aortic aneurysm (AAA) screening. You may need this if you are a current or former smoker. Osteoporosis. You may be screened starting at age  70 if you are at high risk. Talk with your health care provider about your test results, treatment options, and if necessary, the need for more tests. Vaccines  Your health care provider may recommend certain vaccines, such as: Influenza vaccine. This is recommended every year. Tetanus, diphtheria, and acellular pertussis (Tdap, Td) vaccine. You may need a Td booster every 10 years. Zoster  vaccine. You may need this after age 41. Pneumococcal 13-valent conjugate (PCV13) vaccine. One dose is recommended after age 82. Pneumococcal polysaccharide (PPSV23) vaccine. One dose is recommended after age 69. Talk to your health care provider about which screenings and vaccines you need and how often you need them. This information is not intended to replace advice given to you by your health care provider. Make sure you discuss any questions you have with your health care provider. Document Released: 08/20/2015 Document Revised: 04/12/2016 Document Reviewed: 05/25/2015 Elsevier Interactive Patient Education  2017 [place] Prevention in the Home Falls can cause injuries. They can happen to people of all ages. There are many things you can do to make your home safe and to help prevent falls. What can I do on the outside of my home? Regularly fix the edges of walkways and driveways and fix any cracks. Remove anything that might make you trip as you walk through a door, such as a raised step or threshold. Trim any bushes or trees on the path to your home. Use bright outdoor lighting. Clear any walking paths of anything that might make someone trip, such as rocks or tools. Regularly check to see if handrails are loose or broken. Make sure that both sides of any steps have handrails. Any raised decks and porches should have guardrails on the edges. Have any leaves, snow, or ice cleared regularly. Use sand or salt on walking paths during winter. Clean up any spills in your garage right away. This includes oil or grease spills. What can I do in the bathroom? Use night lights. Install grab bars by the toilet and in the tub and shower. Do not use towel bars as grab bars. Use non-skid mats or decals in the tub or shower. If you need to sit down in the shower, use a plastic, non-slip stool. Keep the floor dry. Clean up any water that spills on the floor as soon as it happens. Remove  soap buildup in the tub or shower regularly. Attach bath mats securely with double-sided non-slip rug tape. Do not have throw rugs and other things on the floor that can make you trip. What can I do in the bedroom? Use night lights. Make sure that you have a light by your bed that is easy to reach. Do not use any sheets or blankets that are too big for your bed. They should not hang down onto the floor. Have a firm chair that has side arms. You can use this for support while you get dressed. Do not have throw rugs and other things on the floor that can make you trip. What can I do in the kitchen? Clean up any spills right away. Avoid walking on wet floors. Keep items that you use a lot in easy-to-reach places. If you need to reach something above you, use a strong step stool that has a grab bar. Keep electrical cords out of the way. Do not use floor polish or wax that makes floors slippery. If you must use wax, use non-skid floor wax. Do not have throw rugs and other  things on the floor that can make you trip. What can I do with my stairs? Do not leave any items on the stairs. Make sure that there are handrails on both sides of the stairs and use them. Fix handrails that are broken or loose. Make sure that handrails are as long as the stairways. Check any carpeting to make sure that it is firmly attached to the stairs. Fix any carpet that is loose or worn. Avoid having throw rugs at the top or bottom of the stairs. If you do have throw rugs, attach them to the floor with carpet tape. Make sure that you have a light switch at the top of the stairs and the bottom of the stairs. If you do not have them, ask someone to add them for you. What else can I do to help prevent falls? Wear shoes that: Do not have high heels. Have rubber bottoms. Are comfortable and fit you well. Are closed at the toe. Do not wear sandals. If you use a stepladder: Make sure that it is fully opened. Do not climb a  closed stepladder. Make sure that both sides of the stepladder are locked into place. Ask someone to hold it for you, if possible. Clearly mark and make sure that you can see: Any grab bars or handrails. First and last steps. Where the edge of each step is. Use tools that help you move around (mobility aids) if they are needed. These include: Canes. Walkers. Scooters. Crutches. Turn on the lights when you go into a dark area. Replace any light bulbs as soon as they burn out. Set up your furniture so you have a clear path. Avoid moving your furniture around. If any of your floors are uneven, fix them. If there are any pets around you, be aware of where they are. Review your medicines with your doctor. Some medicines can make you feel dizzy. This can increase your chance of falling. Ask your doctor what other things that you can do to help prevent falls. This information is not intended to replace advice given to you by your health care provider. Make sure you discuss any questions you have with your health care provider. Document Released: 05/20/2009 Document Revised: 12/30/2015 Document Reviewed: 08/28/2014 Elsevier Interactive Patient Education  2017 [employer].

## 2023-02-06 NOTE — Progress Notes (Signed)
Subjective:   [name] is a 72 y.o. male who presents for Medicare Annual/Subsequent preventive examination.  Visit Complete: Virtual  I connected with  [name] on 02/06/23 by a audio enabled telemedicine application and verified that I am speaking with the correct person using two identifiers.  Patient Location: Home  Provider Location: Home Office  I discussed the limitations of evaluation and management by telemedicine. The patient expressed understanding and agreed to proceed.  Patient Medicare AWV questionnaire was completed by the patient on (not done); I have confirmed that all information answered by patient is correct and no changes since this date.  Review of Systems    Cardiac Risk Factors include: advanced age (>57men, >83 women);dyslipidemia;male gender;hypertension;obesity (BMI >30kg/m2);smoking/ tobacco exposure    Objective:    Today's Vitals   02/06/23 0848 02/06/23 0849  Weight: 189 lb (85.7 kg)   Height: 5\' 6"  (1.676 m)   PainSc:  3    Body mass index is 30.51 kg/m.     02/06/2023    9:02 AM 06/02/2022   12:09 PM 04/15/2020    8:38 AM 10/07/2019    7:24 AM 09/16/2019    8:02 AM 04/10/2019    8:39 AM 04/05/2018    9:19 AM  Advanced Directives  Does Patient Have a Medical Advance Directive? No No No No No No No  Would patient like information on creating a medical advance directive?   No - Patient declined No - Patient declined No - Patient declined No - Patient declined No - Patient declined    Current Medications (verified) Outpatient Encounter Medications as of 02/06/2023  Medication Sig   aspirin 81 MG EC tablet Take 1 tablet (81 mg total) by mouth daily.   atorvastatin (LIPITOR) 80 MG tablet Take 1 tablet by mouth once daily   ENSURE PLUS (ENSURE PLUS) LIQD Take 237 mLs by mouth daily.   icosapent Ethyl (VASCEPA) 1 g capsule Take 2 capsules by mouth twice daily   lisinopril (ZESTRIL) 40 MG tablet Take 1 tablet by mouth once daily    Multiple Vitamin (MULTIVITAMIN) tablet Take 1 tablet by mouth every evening.    nitroGLYCERIN (NITROSTAT) 0.4 MG SL tablet Place 1 tablet (0.4 mg total) under the tongue every 5 (five) minutes as needed for chest pain.   No facility-administered encounter medications on file as of 02/06/2023.    Allergies (verified) Lovastatin and Pravastatin   History: Past Medical History:  Diagnosis Date   CAD (coronary artery disease)    a. CT scan 01/02/2015: extensive coronary atherosclerosis;  b. 04/2015 Inf STEMI/Cath/CABG x 4: LIMA->LAD, VG->Diag, VG->OM2, VG->RPL; c. 09/2015 MV: EF 55-65%, infsept/inf infarct w/o ischemia->Low Risk.   Colon polyps    Complete heart block (HCC)    a. 04/2015 in setting of inferior STEMI-->resolved.   Difficult intubation    History of kidney stones    Hyperlipidemia    Hypertension    Ischemic cardiomyopathy    a. 04/2015 LV gram: EF 35-40%; b. 09/2015 MV: EF 55-65%.   MI (myocardial infarction) (HCC) 04/2015   Postoperative atrial fibrillation (HCC)    a. 04/2015 Post-op CABG AF/AFlutter--> converted with rapid atrial pacing and amio; b. CHA2DS2VASc = 3->Coumadin.   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   Unilateral inguinal hernia without obstruction or gangrene 04/29/2015   Ureterolithiasis 01/12/2015   Past Surgical History:  Procedure Laterality Date   CARDIAC CATHETERIZATION N/A 05/03/2015   Procedure: Left Heart Cath and Coronary Angiography;  Surgeon:  Iran Ouch, MD;  Location: [facility] INVASIVE CV LAB;  Service: Cardiovascular;  Laterality: N/A;   CATARACT EXTRACTION, BILATERAL     [facility]   COLONOSCOPY WITH PROPOFOL N/A 06/02/2022   Procedure: COLONOSCOPY WITH PROPOFOL;  Surgeon: Regis Bill, MD;  Location: [facility] ENDOSCOPY;  Service: Endoscopy;  Laterality: N/A;   CORONARY ARTERY BYPASS GRAFT N/A 05/03/2015   Procedure: CORONARY ARTERY BYPASS GRAFTING (CABG) times four using left internal mammary artery and right saphenous vein.;  Surgeon: Loreli Slot, MD;  Location: [facility] OR;  Service: Open Heart Surgery;  Laterality: N/A;   CYSTOSCOPY WITH STENT PLACEMENT Left 09/16/2019   Procedure: CYSTOSCOPY WITH STENT PLACEMENT;  Surgeon: Riki Altes, MD;  Location: [facility] ORS;  Service: Urology;  Laterality: Left;   CYSTOSCOPY/RETROGRADE/URETEROSCOPY Left 09/16/2019   Procedure: CYSTOSCOPY/RETROGRADE/URETEROSCOPY;  Surgeon: Riki Altes, MD;  Location: [facility] ORS;  Service: Urology;  Laterality: Left;   CYSTOSCOPY/URETEROSCOPY/HOLMIUM LASER/STENT PLACEMENT Left 10/07/2019   Procedure: CYSTOSCOPY/URETEROSCOPY/HOLMIUM LASER/STENT Exhange;  Surgeon: Riki Altes, MD;  Location: [facility] ORS;  Service: Urology;  Laterality: Left;   EYE SURGERY     HERNIA REPAIR     ventral hernia repair   JOINT REPLACEMENT     KNEE SURGERY Left    arthroscopic knee surgery   NECK SURGERY     Spinal fusion- neck. Dr. Marikay Alar of [place] ([facility])   right hip replacement Right 02/04/2014   Dr. Hyacinth Meeker   TEE WITHOUT CARDIOVERSION N/A 05/03/2015   Procedure: TRANSESOPHAGEAL ECHOCARDIOGRAM (TEE);  Surgeon: Loreli Slot, MD;  Location: [facility] OR;  Service: Open Heart Surgery;  Laterality: N/A;   Family History  Problem Relation Age of Onset   CAD Father    Heart attack Father    Heart Problems Sister        CABG   Heart attack Brother    Heart attack Brother    Heart attack Brother    Heart attack Brother    Heart attack Sister    CAD Maternal Uncle    CAD Paternal Uncle    Social History   Socioeconomic History   Marital status: Married    Spouse name: Not on file   Number of children: 1   Years of education: Not on file   Highest education level: Associate degree: occupational, Scientist, product/process development, or vocational program  Occupational History   Occupation: Naval architect    Comment: part time  Tobacco Use   Smoking status: Never   Smokeless tobacco: Never  Vaping Use   Vaping Use: Never used  Substance and Sexual Activity    Alcohol use: Not Currently    Alcohol/week: 0.0 standard drinks of alcohol    Comment: rarely   Drug use: No   Sexual activity: Yes    Birth control/protection: None  Other Topics Concern   Not on file  Social History Narrative   Lives with Information systems manager and wife   Social Determinants of Health   Financial Resource Strain: Low Risk  (02/06/2023)   Overall Financial Resource Strain (CARDIA)    Difficulty of Paying Living Expenses: Not hard at all  Food Insecurity: No Food Insecurity (02/06/2023)   Hunger Vital Sign    Worried About Running Out of Food in the Last Year: Never true    Ran Out of Food in the Last Year: Never true  Transportation Needs: No Transportation Needs (02/06/2023)   PRAPARE - Administrator, Civil Service (Medical): No  Lack of Transportation (Non-Medical): No  Physical Activity: Sufficiently Active (02/06/2023)   Exercise Vital Sign    Days of Exercise per Week: 4 days    Minutes of Exercise per Session: 60 min  Stress: No Stress Concern Present (02/06/2023)   [employer] of Occupational Health - Occupational Stress Questionnaire    Feeling of Stress : Not at all  Social Connections: Moderately Isolated (02/06/2023)   Social Connection and Isolation Panel [NHANES]    Frequency of Communication with Friends and Family: More than three times a week    Frequency of Social Gatherings with Friends and Family: More than three times a week    Attends Religious Services: Never    Database administrator or Organizations: No    Attends Engineer, structural: Never    Marital Status: Married    Tobacco Counseling Counseling given: Not Answered   Clinical Intake:  Pre-visit preparation completed: Yes  Pain : 0-10 Pain Score: 3  Pain Type: Chronic pain Pain Location: Shoulder Pain Descriptors / Indicators: Aching Pain Onset: More than a month ago Pain Frequency: Intermittent Pain Relieving Factors: icy hot  Pain Relieving Factors:  icy hot  BMI - recorded: 30.51 Nutritional Status: BMI > 30  Obese Nutritional Risks: None Diabetes: No  How often do you need to have someone help you when you read instructions, pamphlets, or other written materials from your doctor or pharmacy?: 1 - Never  Interpreter Needed?: No  Comments: lives with wife and granddaughter Information entered by :: B.Azra Abrell,LPN   Activities of Daily Living    02/06/2023    9:02 AM 08/11/2022    8:59 AM  In your present state of health, do you have any difficulty performing the following activities:  Hearing? 0 0  Vision? 0 0  Difficulty concentrating or making decisions? 0 0  Walking or climbing stairs? 0 1  Dressing or bathing? 0 0  Doing errands, shopping? 0 0  Preparing Food and eating ? N   Using the Toilet? N   In the past six months, have you accidently leaked urine? N   Do you have problems with loss of bowel control? N   Managing your Medications? N   Managing your Finances? N   Housekeeping or managing your Housekeeping? N     Patient Care Team: Malva Limes, MD as PCP - General (Family Medicine) Iran Ouch, MD as PCP - Cardiology (Cardiology) Deeann Saint, MD as Consulting Physician (Specialist) Riki Altes, MD (Urology) Deirdre Evener, MD (Dermatology) Pa, [facility])  Indicate any recent Medical Services you may have received from other than [facility] providers in the past year (date may be approximate).     Assessment:   This is a routine wellness examination for Inderpreet.  Hearing/Vision screen Hearing Screening - Comments:: Adequate hearing Vision Screening - Comments:: Adequate vision after cataracts;readers only  [facility]  Dietary issues and exercise activities discussed:     Goals Addressed   None    Depression Screen    02/06/2023    9:00 AM 08/11/2022    8:59 AM 05/05/2022    8:13 AM 01/20/2022   10:00 AM 11/12/2020   10:42 AM 04/15/2020    8:36 AM 04/10/2019    8:39  AM  PHQ 2/9 Scores  PHQ - 2 Score 0 0 0 0 0 0 0  PHQ- 9 Score  0 0 5 0      Fall Risk  02/06/2023    8:54 AM 08/11/2022    8:59 AM 05/05/2022    8:14 AM 01/20/2022   10:01 AM 11/12/2020   10:42 AM  Fall Risk   Falls in the past year? 0 0 0 1 0  Number falls in past yr: 0 0 0 1 0  Injury with Fall? 0 0 0 0 0  Risk for fall due to : No Fall Risks No Fall Risks No Fall Risks No Fall Risks   Follow up Falls prevention discussed;Education provided Falls evaluation completed Falls evaluation completed Falls evaluation completed Falls evaluation completed    MEDICARE RISK AT HOME:  Medicare Risk at Home - 02/06/23 0855     Any stairs in or around the home? Yes    If so, are there any without handrails? Yes    Home free of loose throw rugs in walkways, pet beds, electrical cords, etc? Yes    Adequate lighting in your home to reduce risk of falls? Yes    Life alert? No    Use of a cane, walker or w/c? No    Grab bars in the bathroom? Yes    Shower chair or bench in shower? Yes    Elevated toilet seat or a handicapped toilet? Yes             TIMED UP AND GO:  Was the test performed?  No    Cognitive Function:        02/06/2023    9:03 AM 02/23/2017    9:15 AM  6CIT Screen  What Year? 0 points 0 points  What month? 0 points 0 points  What time? 0 points 0 points  Count back from 20 0 points 0 points  Months in reverse 0 points 0 points  Repeat phrase 0 points 0 points  Total Score 0 points 0 points    Immunizations Immunization History  Administered Date(s) Administered   Fluad Quad(high Dose 65+) 05/02/2019   Influenza, High Dose Seasonal PF 06/02/2017, 04/05/2018   Influenza-Unspecified 05/07/2015   PFIZER(Purple Top)SARS-COV-2 Vaccination 05/01/2020   Pneumococcal Conjugate-13 09/15/2015   Pneumococcal Polysaccharide-23 02/23/2017   Tdap 06/19/2008   Zoster, Live 09/15/2015    TDAP status: Up to date  Flu Vaccine status: Declined, Education has been provided  regarding the importance of this vaccine but patient still declined. Advised may receive this vaccine at local pharmacy or Health Dept. Aware to provide a copy of the vaccination record if obtained from local pharmacy or Health Dept. Verbalized acceptance and understanding.  Pneumococcal vaccine status: Up to date  Covid-19 vaccine status: Completed vaccines  Qualifies for Shingles Vaccine? Yes   Zostavax completed No   Shingrix Completed?: No.    Education has been provided regarding the importance of this vaccine. Patient has been advised to call insurance company to determine out of pocket expense if they have not yet received this vaccine. Advised may also receive vaccine at local pharmacy or Health Dept. Verbalized acceptance and understanding.  Screening Tests Health Maintenance  Topic Date Due   Zoster Vaccines- Shingrix (1 of 2) Never done   DTaP/Tdap/Td (2 - Td or Tdap) 06/19/2018   COVID-19 Vaccine (2 - Pfizer risk series) 05/22/2020   INFLUENZA VACCINE  03/08/2023   Medicare Annual Wellness (AWV)  02/06/2024   Colonoscopy  06/02/2025   Pneumonia Vaccine 10+ Years old  Completed   Hepatitis C Screening  Completed   HPV VACCINES  Aged Out    Health Maintenance  Health Maintenance Due  Topic Date Due   Zoster Vaccines- Shingrix (1 of 2) Never done   DTaP/Tdap/Td (2 - Td or Tdap) 06/19/2018   COVID-19 Vaccine (2 - Pfizer risk series) 05/22/2020    Colorectal cancer screening: Type of screening: Colonoscopy. Completed yes. Repeat every 5-10 years  Lung Cancer Screening: (Low Dose CT Chest recommended if Age 110-80 years, 20 pack-year currently smoking OR have quit w/in 15years.) does not qualify.   Lung Cancer Screening Referral: no  Additional Screening:  Hepatitis C Screening: does not qualify; Completed yes  Vision Screening: Recommended annual ophthalmology exams for early detection of glaucoma and other disorders of the eye. Is the patient up to date with their  annual eye exam?  Yes  Who is the provider or what is the name of the office in which the patient attends annual eye exams? [facility] If pt is not established with a provider, would they like to be referred to a provider to establish care? No .   Dental Screening: Recommended annual dental exams for proper oral hygiene  Diabetic Foot Exam: n/a  Community Resource Referral / Chronic Care Management: CRR required this visit?  No   CCM required this visit?  No     Plan:     I have personally reviewed and noted the following in the patient's chart:   Medical and social history Use of alcohol, tobacco or illicit drugs  Current medications and supplements including opioid prescriptions. Patient is not currently taking opioid prescriptions. Functional ability and status Nutritional status Physical activity Advanced directives List of other physicians Hospitalizations, surgeries, and ER visits in previous 12 months Vitals Screenings to include cognitive, depression, and falls Referrals and appointments  In addition, I have reviewed and discussed with patient certain preventive protocols, quality metrics, and best practice recommendations. A written personalized care plan for preventive services as well as general preventive health recommendations were provided to patient.     Sue Lush, LPN   02/12/2955   After Visit Summary: (Declined) Due to this being a telephonic visit, with patients personalized plan was offered to patient but patient Declined AVS at this time   Nurse Notes: The patient states he is doing well and has no concerns or questions at this time.

## 2023-02-12 ENCOUNTER — Encounter: Payer: Self-pay | Admitting: Family Medicine

## 2023-02-12 ENCOUNTER — Ambulatory Visit (INDEPENDENT_AMBULATORY_CARE_PROVIDER_SITE_OTHER): Payer: PPO | Admitting: Family Medicine

## 2023-02-12 VITALS — BP 121/58 | HR 67 | Ht 66.0 in | Wt 196.0 lb

## 2023-02-12 DIAGNOSIS — R7303 Prediabetes: Secondary | ICD-10-CM

## 2023-02-12 DIAGNOSIS — I10 Essential (primary) hypertension: Secondary | ICD-10-CM

## 2023-02-12 DIAGNOSIS — E119 Type 2 diabetes mellitus without complications: Secondary | ICD-10-CM | POA: Insufficient documentation

## 2023-02-12 DIAGNOSIS — E782 Mixed hyperlipidemia: Secondary | ICD-10-CM | POA: Diagnosis not present

## 2023-02-12 DIAGNOSIS — Z125 Encounter for screening for malignant neoplasm of prostate: Secondary | ICD-10-CM

## 2023-02-12 NOTE — Progress Notes (Signed)
Established patient visit   Patient: [name]   DOB: Aug 11, 1950   72 y.o. Male  MRN: [id number] Visit Date: 02/12/2023  Today's healthcare provider: Mila Merry, MD   Chief Complaint  Patient presents with   Follow-up    6 months   Subjective    HPI Here to follow up on hypertension, hyperlipidemia prediabetes, and hypertriglyceridemia with history of CAD. Feels well on current medications taking consistently with no adverse effects. 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  [employer] Readings from Last 3 Encounters:  02/12/23 196 lb (88.9 kg)  02/06/23 189 lb (85.7 kg)  09/21/22 192 lb (87.1 kg)   BP Readings from Last 3 Encounters:  02/12/23 (!) 121/58  09/21/22 123/76  08/11/22 121/69   Lab Results  Component Value Date   CHOL 150 08/11/2022   HDL 30 (L) 08/11/2022   LDLCALC 84 08/11/2022   TRIG 215 (H) 08/11/2022   CHOLHDL 5.0 08/11/2022   Lab Results  Component Value Date   NA 139 01/20/2022   K 5.0 01/20/2022   CREATININE 1.08 01/20/2022   EGFR 73 01/20/2022   GLUCOSE 139 (H) 01/20/2022   Lab Results  Component Value Date   HGBA1C 6.0 (H) 08/11/2022    Medications: Outpatient Medications Prior to Visit  Medication Sig   aspirin 81 MG EC tablet Take 1 tablet (81 mg total) by mouth daily.   atorvastatin (LIPITOR) 80 MG tablet Take 1 tablet by mouth once daily   ENSURE PLUS (ENSURE PLUS) LIQD Take 237 mLs by mouth daily.   icosapent Ethyl (VASCEPA) 1 g capsule Take 2 capsules by mouth twice daily   lisinopril (ZESTRIL) 40 MG tablet Take 1 tablet by mouth once daily   Multiple Vitamin (MULTIVITAMIN) tablet Take 1 tablet by mouth every evening.    nitroGLYCERIN (NITROSTAT) 0.4 MG SL tablet Place 1 tablet (0.4 mg total) under the tongue every 5 (five) minutes as needed for chest pain.   No facility-administered medications prior to visit.    Review of Systems  Constitutional:  Negative for appetite change, chills,  fatigue and fever.  Respiratory:  Negative for chest tightness, shortness of breath and wheezing.   Cardiovascular:  Negative for chest pain and palpitations.  Gastrointestinal:  Negative for abdominal pain, nausea and vomiting.  Neurological:  Positive for tremors. Negative for dizziness, weakness, light-headedness and headaches.       Objective    BP (!) 121/58 (BP Location: Right Arm, Patient Position: Sitting, Cuff Size: Normal)   Pulse 67   Ht 5\' 6"  (1.676 m)   Wt 196 lb (88.9 kg)   SpO2 98%   BMI 31.64 kg/m    Physical Exam   General: Appearance:    Mildly obese male in no acute distress  Eyes:    PERRL, conjunctiva/corneas clear, EOM's intact       Lungs:     Clear to auscultation bilaterally, respirations unlabored  Heart:    Normal heart rate. Normal rhythm. No murmurs, rubs, or gallops.    MS:   All extremities are intact.    Neurologic:   Awake, alert, oriented x 3. No apparent focal neurological defect.         Assessment & Plan     1. 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  - Comprehensive metabolic panel  2. Mixed hyperlipidemia He is tolerating atorvastatin  and icosapent ethyl well with no adverse effects.   - Lipid panel  3. Prediabetes Doing well with diet and working on losing weigh - Hemoglobin A1c  4. Prostate cancer screening  - PSA   Return in about 6 months (around 08/15/2023) for Yearly Physical, Hypertension.        Mila Merry, MD  [facility] [phone] (phone) [phone] (fax)  [facility]

## 2023-02-12 NOTE — Patient Instructions (Signed)
.   Please review the attached list of medications and notify my office if there are any errors.   . Please bring all of your medications to every appointment so we can make sure that our medication list is the same as yours.   

## 2023-02-13 LAB — COMPREHENSIVE METABOLIC PANEL
ALT: 27 IU/L (ref 0–44)
AST: 22 IU/L (ref 0–40)
Albumin: 4.1 g/dL (ref 3.8–4.8)
Alkaline Phosphatase: 92 IU/L (ref 44–121)
BUN/Creatinine Ratio: 21 (ref 10–24)
BUN: 23 mg/dL (ref 8–27)
Bilirubin Total: 0.6 mg/dL (ref 0.0–1.2)
CO2: 23 mmol/L (ref 20–29)
Calcium: 8.6 mg/dL (ref 8.6–10.2)
Chloride: 104 mmol/L (ref 96–106)
Creatinine, Ser: 1.12 mg/dL (ref 0.76–1.27)
Globulin, Total: 2 g/dL (ref 1.5–4.5)
Glucose: 161 mg/dL — ABNORMAL HIGH (ref 70–99)
Potassium: 5.1 mmol/L (ref 3.5–5.2)
Sodium: 140 mmol/L (ref 134–144)
Total Protein: 6.1 g/dL (ref 6.0–8.5)
eGFR: 69 mL/min/{1.73_m2} (ref >59)

## 2023-02-13 LAB — LIPID PANEL
Chol/HDL Ratio: 4.5 ratio (ref 0.0–5.0)
Cholesterol, Total: 131 mg/dL (ref 100–199)
HDL: 29 mg/dL — ABNORMAL LOW (ref >39)
LDL Chol Calc (NIH): 72 mg/dL (ref 0–99)
Triglycerides: 176 mg/dL — ABNORMAL HIGH (ref 0–149)
VLDL Cholesterol Cal: 30 mg/dL (ref 5–40)

## 2023-02-13 LAB — PSA: Prostate Specific Ag, Serum: 0.8 ng/mL (ref 0.0–4.0)

## 2023-02-13 LAB — HEMOGLOBIN A1C
Est. average glucose Bld gHb Est-mCnc: 140 mg/dL
Hgb A1c MFr Bld: 6.5 % — ABNORMAL HIGH (ref 4.8–5.6)

## 2023-02-17 ENCOUNTER — Other Ambulatory Visit: Payer: Self-pay | Admitting: Family Medicine

## 2023-03-20 ENCOUNTER — Other Ambulatory Visit: Payer: Self-pay | Admitting: Family Medicine

## 2023-05-28 ENCOUNTER — Other Ambulatory Visit: Payer: Self-pay | Admitting: Family Medicine

## 2023-05-28 NOTE — Telephone Encounter (Signed)
Medication Refill - Medication: nitroGLYCERIN (NITROSTAT) 0.4 MG SL table  Has the patient contacted their pharmacy? No    Preferred Pharmacy (with phone number or street name):   [facility] [address] [place] - [address]  [address] [place] [address]  Phone:  [phone]  Fax:  [phone]  DEA #:  -- [id number] Reason: --   Has the patient been seen for an appointment in the last year OR does the patient have an upcoming appointment? Yes.    Agent: Please be advised that RX refills may take up to 3 business days. We ask that you follow-up with your pharmacy.

## 2023-05-29 NOTE — Telephone Encounter (Signed)
Requested medications are due for refill today.  yes  Requested medications are on the active medications list.  yes  Last refill. 06/07/2022 #25 3 rf  Future visit scheduled.   yes  Notes to clinic.  Rx signed by Vernon Prey.    Requested Prescriptions  Pending Prescriptions Disp Refills   nitroGLYCERIN (NITROSTAT) 0.4 MG SL tablet 25 tablet 3    Sig: Place 1 tablet (0.4 mg total) under the tongue every 5 (five) minutes as needed for chest pain.     Cardiovascular:  Nitrates Passed - 05/28/2023  2:37 PM      Passed - Last BP in normal range    BP Readings from Last 1 Encounters:  02/12/23 (!) 121/58         Passed - Last Heart Rate in normal range    Pulse Readings from Last 1 Encounters:  02/12/23 67         Passed - Valid encounter within last 12 months    Recent Outpatient Visits           3 months ago Primary hypertension   [place] [facility] [place] Malva Limes, MD   9 months ago Mixed hyperlipidemia   [facility] Malva Limes, MD   1 year ago Mixed hyperlipidemia   [place] [facility] Malva Limes, MD   1 year ago Annual physical exam   [facility] Malva Limes, MD   1 year ago Primary hypertension   [place] [facility] Malva Limes, MD       Future Appointments             In 2 months Fisher, Demetrios Isaacs, MD [facility]

## 2023-05-31 MED ORDER — NITROGLYCERIN 0.4 MG SL SUBL: 0.4000 mg | SUBLINGUAL_TABLET | SUBLINGUAL | 3 refills | Status: DC | PRN

## 2023-06-11 ENCOUNTER — Encounter: Payer: Self-pay | Admitting: Family Medicine

## 2023-06-11 ENCOUNTER — Ambulatory Visit (INDEPENDENT_AMBULATORY_CARE_PROVIDER_SITE_OTHER): Payer: PPO | Admitting: Family Medicine

## 2023-06-11 VITALS — BP 130/63 | HR 82 | Temp 98.0°F | Wt 193.0 lb

## 2023-06-11 DIAGNOSIS — U071 COVID-19: Secondary | ICD-10-CM

## 2023-06-11 MED ORDER — NIRMATRELVIR/RITONAVIR (PAXLOVID)TABLET: 3.0000 | ORAL_TABLET | Freq: Two times a day (BID) | ORAL | 0 refills | Status: AC

## 2023-06-11 NOTE — Progress Notes (Unsigned)
Established patient visit   Patient: [name]   DOB: 29-Nov-1950   72 y.o. Male  MRN: [id number] Visit Date: 06/11/2023  Today's healthcare provider: Mila Merry, MD   Chief Complaint  Patient presents with   Covid Positive   Subjective    Discussed the use of AI scribe software for clinical note transcription with the patient, who gave verbal consent to proceed.  History of Present Illness   The patient, with a history of hyperlipidemia, presented with symptoms suggestive of a viral upper respiratory tract infection that started on Friday. He initially thought it was a common cold, with symptoms including a runny nose, dry throat, and associated cough. The patient also reported a general feeling of weakness. He denied experiencing fever, chills, or sweats. However, he did note some difficulty in breathing, which required him to take deep breaths, particularly noticeable during sleep.  The patient's spouse administered a home COVID-19 test, which returned a positive result. The patient's stepson, who does not live with him, was also recently diagnosed with COVID-19. The patient's breathing difficulty remained consistent over the past few days.  The patient had received the COVID-19 vaccine a couple of years ago. He had previously taken Paxlovid about a year ago without any adverse effects. The patient was also on Lipitor for his hyperlipidemia.       Medications: Outpatient Medications Prior to Visit  Medication Sig   aspirin 81 MG EC tablet Take 1 tablet (81 mg total) by mouth daily.   atorvastatin (LIPITOR) 80 MG tablet Take 1 tablet by mouth once daily   ENSURE PLUS (ENSURE PLUS) LIQD Take 237 mLs by mouth daily.   icosapent Ethyl (VASCEPA) 1 g capsule Take 2 capsules by mouth twice daily   lisinopril (ZESTRIL) 40 MG tablet Take 1 tablet by mouth once daily   Multiple Vitamin (MULTIVITAMIN) tablet Take 1 tablet by mouth every evening.    nitroGLYCERIN (NITROSTAT)  0.4 MG SL tablet Place 1 tablet (0.4 mg total) under the tongue every 5 (five) minutes as needed for chest pain.   No facility-administered medications prior to visit.      Objective    BP 130/63   Pulse 82   Temp 98 F (36.7 C)   Wt 193 lb (87.5 kg)   SpO2 98%   BMI 31.15 kg/m {Insert last BP/Wt (optional):23777}{See vitals history (optional):1}  Physical Exam   General: Appearance:    Obese male in no acute distress  Eyes:    PERRL, conjunctiva/corneas clear, EOM's intact       Lungs:     Clear to auscultation bilaterally, respirations unlabored  Heart:    Normal heart rate. Normal rhythm. No murmurs, rubs, or gallops.    MS:   All extremities are intact.    Neurologic:   Awake, alert, oriented x 3. No apparent focal neurological defect.         Assessment & Plan        COVID-19 Positive home test with symptoms of cough, fatigue, and mild dyspnea. No fever or severe respiratory distress. Previously tolerated Paxlovid without adverse effects. -Start Paxlovid as previously tolerated. -Stop Lipitor for 10 days due to potential interaction with Paxlovid. -Advise patient to seek medical attention if symptoms worsen, indicating possible progression to bacterial bronchitis or pneumonia.    No follow-ups on file.      Mila Merry, MD  [facility] [phone] (phone) [phone] (fax)  [facility]

## 2023-07-29 ENCOUNTER — Other Ambulatory Visit: Payer: Self-pay | Admitting: Cardiovascular Disease

## 2023-07-29 DIAGNOSIS — I10 Essential (primary) hypertension: Secondary | ICD-10-CM

## 2023-07-30 NOTE — Telephone Encounter (Signed)
Hello,  Will you please outreach patient to scheduld past due follow up?  LOV 06/2022  Thanks,  Ferne Coe

## 2023-08-02 DIAGNOSIS — M1612 Unilateral primary osteoarthritis, left hip: Secondary | ICD-10-CM | POA: Diagnosis not present

## 2023-08-17 ENCOUNTER — Encounter: Payer: Self-pay | Admitting: Family Medicine

## 2023-08-17 ENCOUNTER — Ambulatory Visit: Payer: PPO | Admitting: Family Medicine

## 2023-08-17 VITALS — BP 116/64 | HR 81 | Resp 16 | Ht 66.0 in | Wt 189.8 lb

## 2023-08-17 DIAGNOSIS — Z2889 Immunization not carried out for other reason: Secondary | ICD-10-CM | POA: Diagnosis not present

## 2023-08-17 DIAGNOSIS — I10 Essential (primary) hypertension: Secondary | ICD-10-CM

## 2023-08-17 DIAGNOSIS — R7303 Prediabetes: Secondary | ICD-10-CM | POA: Diagnosis not present

## 2023-08-17 DIAGNOSIS — E782 Mixed hyperlipidemia: Secondary | ICD-10-CM

## 2023-08-17 DIAGNOSIS — Z23 Encounter for immunization: Secondary | ICD-10-CM

## 2023-08-17 DIAGNOSIS — Z0001 Encounter for general adult medical examination with abnormal findings: Secondary | ICD-10-CM | POA: Diagnosis not present

## 2023-08-17 DIAGNOSIS — I2510 Atherosclerotic heart disease of native coronary artery without angina pectoris: Secondary | ICD-10-CM

## 2023-08-17 DIAGNOSIS — Z289 Immunization not carried out for unspecified reason: Secondary | ICD-10-CM

## 2023-08-17 DIAGNOSIS — M1612 Unilateral primary osteoarthritis, left hip: Secondary | ICD-10-CM | POA: Diagnosis not present

## 2023-08-17 DIAGNOSIS — Z0000 Encounter for general adult medical examination without abnormal findings: Secondary | ICD-10-CM

## 2023-08-17 MED ORDER — TETANUS-DIPHTH-ACELL PERTUSSIS 5-2.5-18.5 LF-MCG/0.5 IM SUSY: 0.5000 mL | PREFILLED_SYRINGE | Freq: Once | INTRAMUSCULAR | 0 refills | Status: DC

## 2023-08-17 MED ORDER — RSVPREF3 VAC RECOMB ADJUVANTED 120 MCG/0.5ML IM SUSR: 0.5000 mL | Freq: Once | INTRAMUSCULAR | 0 refills | Status: DC

## 2023-08-17 MED ORDER — SHINGRIX 50 MCG/0.5ML IM SUSR: 0.5000 mL | Freq: Once | INTRAMUSCULAR | 0 refills | Status: DC

## 2023-08-17 NOTE — Patient Instructions (Signed)
 Please review the attached list of medications and notify my office if there are any errors.   You are due for a Tdap (tetanus-diptheria-pertussis vaccine) which protects you from tetanus and whooping cough. Please check with your insurance plan or pharmacy regarding coverage for this vaccine.  I recommend that you get the RSV vaccine (Arexvy) to protect yourself from certain types of viral pneumonia. This vaccine is typically covered under Medicare prescription plans and should be covered if you get it at your pharmacy.  The CDC recommends two doses of Shingrix  (the shingles vaccine) separated by 2 to 6 months for adults age 75 years and older. I recommend checking with your insurance plan regarding coverage for this vaccine.

## 2023-08-17 NOTE — Progress Notes (Addendum)
 Complete physical exam   Patient: [name]   DOB: 1950/10/21   73 y.o. Male  MRN: [id number] Visit Date: 08/17/2023  Today's healthcare provider: Nancyann Perry, MD   Chief Complaint  Patient presents with   Annual Exam    AWV 02/06/2023 Patient reports feeling well over all. He just finished a course of Prednisone 10 mg and Tramadol  50. Has an appointment today to see Dr. Cleotilde.   Hyperlipidemia   Hypertension   Prediabetes   Subjective    Discussed the use of AI scribe software for clinical note transcription with the patient, who gave verbal consent to proceed.  History of Present Illness   The patient, with a history of arthritis, hypertension, high cholesterol and coronary artery disease, presented for a routine physical. He reported a recent exacerbation of left hip arthritis, which was managed with a 10-day course of prednisone prescribed by orthopedics, ending four days prior to the visit. The prednisone provided some relief, but the patient has an upcoming appointment with an orthopedic surgeon to evaluate the need for hip replacement.  He denied checking his blood sugar at home. He has an upcoming appointment with his cardiologist for a routine check-up. Denies any chest pains, dyspnea, or palpitations.  The patient denied any changes in bowel habits or stomach problems. He reported no issues with his current prescriptions and did not request any refills or new prescriptions. is UTD on CRC screenings.        Past Medical History:  Diagnosis Date   CAD (coronary artery disease)    a. CT scan 01/02/2015: extensive coronary atherosclerosis;  b. 04/2015 Inf STEMI/Cath/CABG x 4: LIMA->LAD, VG->Diag, VG->OM2, VG->RPL; c. 09/2015 MV: EF 55-65%, infsept/inf infarct w/o ischemia->Low Risk.   Colon polyps    Complete heart block (HCC)    a. 04/2015 in setting of inferior STEMI-->resolved.   Difficult intubation    History of kidney stones    Hyperlipidemia     Hypertension    Ischemic cardiomyopathy    a. 04/2015 LV gram: EF 35-40%; b. 09/2015 MV: EF 55-65%.   MI (myocardial infarction) (HCC) 04/2015   Postoperative atrial fibrillation (HCC)    a. 04/2015 Post-op CABG AF/AFlutter--> converted with rapid atrial pacing and amio; b. CHA2DS2VASc = 3->Coumadin .   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   Unilateral inguinal hernia without obstruction or gangrene 04/29/2015   Ureterolithiasis 01/12/2015   Past Surgical History:  Procedure Laterality Date   CARDIAC CATHETERIZATION N/A 05/03/2015   Procedure: Left Heart Cath and Coronary Angiography;  Surgeon: Deatrice DELENA Cage, MD;  Location: [facility] INVASIVE CV LAB;  Service: Cardiovascular;  Laterality: N/A;   CATARACT EXTRACTION, BILATERAL     [facility]   COLONOSCOPY WITH PROPOFOL  N/A 06/02/2022   Procedure: COLONOSCOPY WITH PROPOFOL ;  Surgeon: Maryruth Ole DASEN, MD;  Location: [facility] ENDOSCOPY;  Service: Endoscopy;  Laterality: N/A;   CORONARY ARTERY BYPASS GRAFT N/A 05/03/2015   Procedure: CORONARY ARTERY BYPASS GRAFTING (CABG) times four using left internal mammary artery and right saphenous vein.;  Surgeon: Elspeth JAYSON Millers, MD;  Location: [facility];  Service: Open Heart Surgery;  Laterality: N/A;   CYSTOSCOPY WITH STENT PLACEMENT Left 09/16/2019   Procedure: CYSTOSCOPY WITH STENT PLACEMENT;  Surgeon: Twylla Glendia JAYSON, MD;  Location: [facility] ORS;  Service: Urology;  Laterality: Left;   CYSTOSCOPY/RETROGRADE/URETEROSCOPY Left 09/16/2019   Procedure: CYSTOSCOPY/RETROGRADE/URETEROSCOPY;  Surgeon: Twylla Glendia JAYSON, MD;  Location: [facility] ORS;  Service: Urology;  Laterality: Left;  CYSTOSCOPY/URETEROSCOPY/HOLMIUM LASER/STENT PLACEMENT Left 10/07/2019   Procedure: CYSTOSCOPY/URETEROSCOPY/HOLMIUM LASER/STENT Exhange;  Surgeon: Twylla Glendia BROCKS, MD;  Location: [facility] ORS;  Service: Urology;  Laterality: Left;   EYE SURGERY     HERNIA REPAIR     ventral hernia repair   JOINT REPLACEMENT     KNEE SURGERY Left     arthroscopic knee surgery   NECK SURGERY     Spinal fusion- neck. Dr. Alm Molt of [place] ([facility])   right hip replacement Right 02/04/2014   Dr. Cleotilde   TEE WITHOUT CARDIOVERSION N/A 05/03/2015   Procedure: TRANSESOPHAGEAL ECHOCARDIOGRAM (TEE);  Surgeon: Elspeth BROCKS Millers, MD;  Location: [facility] OR;  Service: Open Heart Surgery;  Laterality: N/A;   Social History   Socioeconomic History   Marital status: Married    Spouse name: Not on file   Number of children: 1   Years of education: Not on file   Highest education level: Associate degree: occupational, scientist, product/process development, or vocational program  Occupational History   Occupation: Naval Architect    Comment: part time  Tobacco Use   Smoking status: Never   Smokeless tobacco: Never  Vaping Use   Vaping status: Never Used  Substance and Sexual Activity   Alcohol use: Not Currently    Alcohol/week: 0.0 standard drinks of alcohol    Comment: rarely   Drug use: No   Sexual activity: Yes    Birth control/protection: None  Other Topics Concern   Not on file  Social History Narrative   Lives with Information Systems Manager and wife   Social Drivers of Corporate Investment Banker Strain: Low Risk  (02/06/2023)   Overall Financial Resource Strain (CARDIA)    Difficulty of Paying Living Expenses: Not hard at all  Food Insecurity: No Food Insecurity (02/06/2023)   Hunger Vital Sign    Worried About Running Out of Food in the Last Year: Never true    Ran Out of Food in the Last Year: Never true  Transportation Needs: No Transportation Needs (02/06/2023)   PRAPARE - Administrator, Civil Service (Medical): No    Lack of Transportation (Non-Medical): No  Physical Activity: Sufficiently Active (02/06/2023)   Exercise Vital Sign    Days of Exercise per Week: 4 days    Minutes of Exercise per Session: 60 min  Stress: No Stress Concern Present (02/06/2023)   [employer] of Occupational Health - Occupational Stress Questionnaire     Feeling of Stress : Not at all  Social Connections: Moderately Isolated (02/06/2023)   Social Connection and Isolation Panel [NHANES]    Frequency of Communication with Friends and Family: More than three times a week    Frequency of Social Gatherings with Friends and Family: More than three times a week    Attends Religious Services: Never    Database Administrator or Organizations: No    Attends Banker Meetings: Never    Marital Status: Married  Catering Manager Violence: Not At Risk (02/06/2023)   Humiliation, Afraid, Rape, and Kick questionnaire    Fear of Current or Ex-Partner: No    Emotionally Abused: No    Physically Abused: No    Sexually Abused: No   Family Status  Relation Name Status   Father  Deceased at age 8       MI   Mother  Deceased at age 70       Died of unknown causes   Sister  Audiological Scientist  Deceased at age 47       MI   Brother  Deceased at age 32       MI   Brother  Deceased   Brother  Deceased   Brother  Alive   Sister  Deceased   Nurse, Mental Health  (Not Specified)   Nutritional Therapist  (Not Specified)  No partnership data on file   Family History  Problem Relation Age of Onset   CAD Father    Heart attack Father    Heart Problems Sister        CABG   Heart attack Brother    Heart attack Brother    Heart attack Brother    Heart attack Brother    Heart attack Sister    CAD Maternal Uncle    CAD Paternal Uncle    Allergies  Allergen Reactions   Lovastatin Other (See Comments)    Chest pain   Pravastatin Other (See Comments)    Nose bleed    Patient Care Team: [name], MD as PCP - General (Family Medicine) Darron Deatrice LABOR, MD as PCP - Cardiology (Cardiology) Cleotilde Barrio, MD as Consulting Physician (Specialist) Twylla Glendia BROCKS, MD (Urology) Hester Alm BROCKS, MD (Dermatology) Pa, [facility] (Optometry)   Medications: Outpatient Medications Prior to Visit  Medication Sig   aspirin  81 MG EC tablet Take 1  tablet (81 mg total) by mouth daily.   atorvastatin  (LIPITOR ) 80 MG tablet Take 1 tablet by mouth once daily   ENSURE PLUS (ENSURE PLUS) LIQD Take 237 mLs by mouth daily.   icosapent  Ethyl (VASCEPA ) 1 g capsule Take 2 capsules by mouth twice daily   lisinopril  (ZESTRIL ) 40 MG tablet Take 1 tablet by mouth once daily   Multiple Vitamin (MULTIVITAMIN) tablet Take 1 tablet by mouth every evening.    nitroGLYCERIN  (NITROSTAT ) 0.4 MG SL tablet Place 1 tablet (0.4 mg total) under the tongue every 5 (five) minutes as needed for chest pain.   No facility-administered medications prior to visit.    Review of Systems  Constitutional:  Negative for appetite change, chills and fever.  Respiratory:  Negative for chest tightness, shortness of breath and wheezing.   Cardiovascular:  Negative for chest pain and palpitations.  Gastrointestinal:  Negative for abdominal pain, nausea and vomiting.      Objective    BP 116/64 (BP Location: Left Arm, Patient Position: Sitting, Cuff Size: Large)   Pulse 81   Resp 16   Ht 5' 6 (1.676 m)   Wt 189 lb 12.8 oz (86.1 kg)   SpO2 98%   BMI 30.63 kg/m    Physical Exam  General Appearance:    Obese male. Alert, cooperative, in no acute distress, appears stated age  Head:    Normocephalic, without obvious abnormality, atraumatic  Eyes:    PERRL, conjunctiva/corneas clear, EOM's intact, fundi    benign, both eyes       Ears:    Normal TM's and external ear canals, both ears  Nose:   Nares normal, septum midline, mucosa normal, no drainage   or sinus tenderness  Throat:   Lips, mucosa, and tongue normal; teeth and gums normal  Neck:   Supple, symmetrical, trachea midline, no adenopathy;       thyroid :  No enlargement/tenderness/nodules; no carotid   bruit or JVD  Back:     Symmetric, no curvature, ROM normal, no CVA tenderness  Lungs:     Clear to auscultation bilaterally, respirations  unlabored  Chest wall:    No tenderness or deformity  Heart:     Normal heart rate. Normal rhythm. No murmurs, rubs, or gallops.  S1 and S2 normal  Abdomen:     Soft, non-tender, bowel sounds active all four quadrants,    no masses, no organomegaly  Genitalia:    deferred  Rectal:    deferred  Extremities:   All extremities are intact. No cyanosis or edema  Pulses:   2+ and symmetric all extremities  Skin:   Skin color, texture, turgor normal, innumerable seborrheic keratoses across back.   Lymph nodes:   Cervical, supraclavicular, and axillary nodes normal  Neurologic:   CNII-XII intact. Normal strength, sensation and reflexes      throughout    Last depression screening scores    08/17/2023    8:43 AM 06/11/2023    1:40 PM 02/12/2023    8:07 AM  PHQ 2/9 Scores  PHQ - 2 Score 0 2 0  PHQ- 9 Score  10 0   Last fall risk screening    08/17/2023    8:43 AM  Fall Risk   Falls in the past year? 1  Number falls in past yr: 1  Injury with Fall? 0  Risk for fall due to : Other (Comment)  Follow up Falls evaluation completed   Last Audit-C alcohol use screening    02/12/2023    8:07 AM  Alcohol Use Disorder Test (AUDIT)  1. How often do you have a drink containing alcohol? 0  3. How often do you have six or more drinks on one occasion? 0   A score of 3 or more in women, and 4 or more in men indicates increased risk for alcohol abuse, EXCEPT if all of the points are from question 1   No results found for any visits on 08/17/23.  Assessment & Plan    Routine Health Maintenance and Physical Exam  Exercise Activities and Dietary recommendations  Goals      Exercise 3x per week (30 min per time)     Recommend to exercise for 3 days a week for at least 30 minutes at a time.         Immunization History  Administered Date(s) Administered   Fluad Quad(high Dose 65+) 05/02/2019   Influenza, High Dose Seasonal PF 06/02/2017, 04/05/2018   Influenza-Unspecified 05/07/2015   PFIZER(Purple Top)SARS-COV-2 Vaccination 05/01/2020   Pneumococcal  Conjugate-13 09/15/2015   Pneumococcal Polysaccharide-23 02/23/2017   Tdap 06/19/2008   Zoster, Live 09/15/2015    Health Maintenance  Topic Date Due   Zoster Vaccines- Shingrix  (1 of 2) 06/16/1968   DTaP/Tdap/Td (2 - Td or Tdap) 06/19/2018   COVID-19 Vaccine (2 - Pfizer risk series) 05/22/2020   INFLUENZA VACCINE  11/05/2023 (Originally 03/08/2023)   Medicare Annual Wellness (AWV)  02/06/2024   Colonoscopy  06/02/2025   Pneumonia Vaccine 24+ Years old  Completed   Hepatitis C Screening  Completed   HPV VACCINES  Aged Out    Discussed health benefits of physical activity, and encouraged him to engage in regular exercise appropriate for his age and condition.     Left Hip Arthritis Recent exacerbation managed with a course of Prednisone. Appointment with orthopedic surgeon (Dr. Beola) scheduled for further evaluation and management. -Continue current management plan as advised by Dr. Beola.  Prediabetes Recent use of Prednisone may have temporarily increased blood glucose levels. Patient reports increased appetite while on Prednisone. -Check A1c today, understanding that it  may be temporarily elevated due to recent Prednisone use.  Coronary artery disease. Hyperlipidemia Asymptomatic. Compliant with medication.  Continue aggressive risk factor modification.    Plantar Fasciitis Right foot heel pain, possibly due to compensatory gait from left hip arthritis. Self-managed with heat application. -Continue current self-management.  Immunizations Given prescriptions for Shingtix, Arexvy and Tdap.   General Health Maintenance -Continue annual cardiology follow-up (appointment scheduled for next Wednesday). -Continue dermatology follow-up as needed. -Order routine labs today.           Nancyann Perry, MD  [facility] [phone] (phone) [phone] (fax)  [facility]

## 2023-08-18 LAB — COMPREHENSIVE METABOLIC PANEL
ALT: 46 [IU]/L — ABNORMAL HIGH (ref 0–44)
AST: 21 [IU]/L (ref 0–40)
Albumin: 4 g/dL (ref 3.8–4.8)
Alkaline Phosphatase: 95 [IU]/L (ref 44–121)
BUN/Creatinine Ratio: 20 (ref 10–24)
BUN: 24 mg/dL (ref 8–27)
Bilirubin Total: 1.3 mg/dL — ABNORMAL HIGH (ref 0.0–1.2)
CO2: 25 mmol/L (ref 20–29)
Calcium: 9 mg/dL (ref 8.6–10.2)
Chloride: 100 mmol/L (ref 96–106)
Creatinine, Ser: 1.19 mg/dL (ref 0.76–1.27)
Globulin, Total: 1.8 g/dL (ref 1.5–4.5)
Glucose: 152 mg/dL — ABNORMAL HIGH (ref 70–99)
Potassium: 4.7 mmol/L (ref 3.5–5.2)
Sodium: 141 mmol/L (ref 134–144)
Total Protein: 5.8 g/dL — ABNORMAL LOW (ref 6.0–8.5)
eGFR: 64 mL/min/{1.73_m2} (ref >59)

## 2023-08-18 LAB — CBC
Hematocrit: 48.1 % (ref 37.5–51.0)
Hemoglobin: 15.3 g/dL (ref 13.0–17.7)
MCH: 26.9 pg (ref 26.6–33.0)
MCHC: 31.8 g/dL (ref 31.5–35.7)
MCV: 85 fL (ref 79–97)
Platelets: 216 10*3/uL (ref 150–450)
RBC: 5.69 x10E6/uL (ref 4.14–5.80)
RDW: 14.6 % (ref 11.6–15.4)
WBC: 19 10*3/uL — ABNORMAL HIGH (ref 3.4–10.8)

## 2023-08-18 LAB — LIPID PANEL
Chol/HDL Ratio: 4.4 {ratio} (ref 0.0–5.0)
Cholesterol, Total: 145 mg/dL (ref 100–199)
HDL: 33 mg/dL — ABNORMAL LOW (ref >39)
LDL Chol Calc (NIH): 73 mg/dL (ref 0–99)
Triglycerides: 234 mg/dL — ABNORMAL HIGH (ref 0–149)
VLDL Cholesterol Cal: 39 mg/dL (ref 5–40)

## 2023-08-18 LAB — HEMOGLOBIN A1C
Est. average glucose Bld gHb Est-mCnc: 163 mg/dL
Hgb A1c MFr Bld: 7.3 % — ABNORMAL HIGH (ref 4.8–5.6)

## 2023-08-22 ENCOUNTER — Ambulatory Visit: Payer: PPO | Attending: Medical | Admitting: Medical

## 2023-08-22 ENCOUNTER — Encounter: Payer: Self-pay | Admitting: Medical

## 2023-08-22 ENCOUNTER — Ambulatory Visit: Payer: PPO

## 2023-08-22 VITALS — BP 120/60 | HR 75 | Ht 66.0 in | Wt 196.0 lb

## 2023-08-22 DIAGNOSIS — I493 Ventricular premature depolarization: Secondary | ICD-10-CM | POA: Diagnosis not present

## 2023-08-22 DIAGNOSIS — E782 Mixed hyperlipidemia: Secondary | ICD-10-CM | POA: Diagnosis not present

## 2023-08-22 DIAGNOSIS — I2510 Atherosclerotic heart disease of native coronary artery without angina pectoris: Secondary | ICD-10-CM

## 2023-08-22 DIAGNOSIS — I10 Essential (primary) hypertension: Secondary | ICD-10-CM | POA: Diagnosis not present

## 2023-08-22 MED ORDER — NITROGLYCERIN 0.4 MG SL SUBL: 0.4000 mg | SUBLINGUAL_TABLET | SUBLINGUAL | 3 refills | Status: AC | PRN

## 2023-08-22 NOTE — Progress Notes (Signed)
 Cardiology Office Note:    Date:  08/22/2023   ID:  [name], DOB 09/25/1950, MRN [id number]  PCP:  Lamon Pillow, MD  [facility] Cardiologist:  Antionette Kirks, MD  [facility] Electrophysiologist:  None   Referring MD: Lamon Pillow, MD   Chief Complaint: 1 year follow-up  History of Present Illness:    [name] is a 73 y.o. male with a hx of  CAD status post CABG September 2016, mixed hyperlipidemia, and ischemic cardiomyopathy with improved EF who presents for 1 year follow-up.   Patient presented with inferior STEMI 2016.  Cardiac cath showed severe three-vessel CAD.  The culprit was an occluded distal RCA with heavy thrombus burden.  EF was 35 to 40%.  Aspiration thrombectomy and balloon angioplasty was performed with restoration of TIMI-3 flow.  However, the results were suboptimal and the patient underwent urgent CABG by Dr. Luna Salinas.  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  Exercise stress test in 2022 was normal.  Patient was last seen in November 2023 and was overall doing well from a cardiac perspective.  ETT was ordered for DOT clearance.  This was overall unremarkable.  Today, the patient is overall doing well. He is no longer driving and does not need a DOT clearance. 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 He denies chest pain, SOB, LLE, orthopnea, pnd, lightheadedness, dizziness, palpitations, heart racing. He is not driving anymore. He stays busy at home. He needs refills of SL NTG  Past Medical History:  Diagnosis Date   CAD (coronary artery disease)    a. CT scan 01/02/2015: extensive coronary atherosclerosis;  b. 04/2015 Inf STEMI/Cath/CABG x 4: LIMA->LAD, VG->Diag, VG->OM2,  VG->RPL; c. 09/2015 MV: EF 55-65%, infsept/inf infarct w/o ischemia->Low Risk.   Colon polyps    Complete heart block (HCC)    a. 04/2015 in setting of inferior STEMI-->resolved.   Difficult intubation    History of kidney stones    Hyperlipidemia    Hypertension    Ischemic cardiomyopathy    a. 04/2015 LV gram: EF 35-40%; b. 09/2015 MV: EF 55-65%.   MI (myocardial infarction) (HCC) 04/2015   Postoperative atrial fibrillation (HCC)    a. 04/2015 Post-op CABG AF/AFlutter--> converted with rapid atrial pacing and amio; b. CHA2DS2VASc = 3->Coumadin .   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   Unilateral inguinal hernia without obstruction or gangrene 04/29/2015   Ureterolithiasis 01/12/2015    Past Surgical History:  Procedure Laterality Date   CARDIAC CATHETERIZATION N/A 05/03/2015   Procedure: Left Heart Cath and Coronary Angiography;  Surgeon: Wenona Hamilton, MD;  Location: [facility] INVASIVE CV LAB;  Service: Cardiovascular;  Laterality: N/A;   CATARACT EXTRACTION, BILATERAL     [facility]   COLONOSCOPY WITH PROPOFOL  N/A 06/02/2022   Procedure: COLONOSCOPY WITH PROPOFOL ;  Surgeon: Shane Darling, MD;  Location: [facility] ENDOSCOPY;  Service: Endoscopy;  Laterality: N/A;   CORONARY ARTERY BYPASS GRAFT N/A 05/03/2015   Procedure: CORONARY ARTERY BYPASS GRAFTING (CABG) times four using left internal mammary artery and right saphenous vein.;  Surgeon: Zelphia Higashi, MD;  Location: [facility] OR;  Service: Open Heart Surgery;  Laterality: N/A;   CYSTOSCOPY WITH STENT PLACEMENT Left 09/16/2019  Procedure: CYSTOSCOPY WITH STENT PLACEMENT;  Surgeon: Geraline Knapp, MD;  Location: [facility] ORS;  Service: Urology;  Laterality: Left;   CYSTOSCOPY/RETROGRADE/URETEROSCOPY Left 09/16/2019   Procedure: CYSTOSCOPY/RETROGRADE/URETEROSCOPY;  Surgeon: Geraline Knapp, MD;  Location: [facility] ORS;  Service: Urology;  Laterality: Left;   CYSTOSCOPY/URETEROSCOPY/HOLMIUM LASER/STENT PLACEMENT Left 10/07/2019   Procedure:  CYSTOSCOPY/URETEROSCOPY/HOLMIUM LASER/STENT Exhange;  Surgeon: Geraline Knapp, MD;  Location: [facility] ORS;  Service: Urology;  Laterality: Left;   EYE SURGERY     HERNIA REPAIR     ventral hernia repair   JOINT REPLACEMENT     KNEE SURGERY Left    arthroscopic knee surgery   NECK SURGERY     Spinal fusion- neck. Dr. Waymond Hailey of [place] ([facility])   right hip replacement Right 02/04/2014   Dr. Annabell Key   TEE WITHOUT CARDIOVERSION N/A 05/03/2015   Procedure: TRANSESOPHAGEAL ECHOCARDIOGRAM (TEE);  Surgeon: Zelphia Higashi, MD;  Location: [facility] OR;  Service: Open Heart Surgery;  Laterality: N/A;    Current Medications: Current Meds  Medication Sig   aspirin  81 MG EC tablet Take 1 tablet (81 mg total) by mouth daily.   atorvastatin  (LIPITOR ) 80 MG tablet Take 1 tablet by mouth once daily   ENSURE PLUS (ENSURE PLUS) LIQD Take 237 mLs by mouth daily.   icosapent  Ethyl (VASCEPA ) 1 g capsule Take 2 capsules by mouth twice daily   lisinopril  (ZESTRIL ) 40 MG tablet Take 1 tablet by mouth once daily   Multiple Vitamin (MULTIVITAMIN) tablet Take 1 tablet by mouth every evening.    traMADol  (ULTRAM ) 50 MG tablet Take 50 mg by mouth every 6 (six) hours as needed.   [DISCONTINUED] nitroGLYCERIN  (NITROSTAT ) 0.4 MG SL tablet Place 1 tablet (0.4 mg total) under the tongue every 5 (five) minutes as needed for chest pain.     Allergies:   Lovastatin and Pravastatin   Social History   Socioeconomic History   Marital status: Married    Spouse name: Not on file   Number of children: 1   Years of education: Not on file   Highest education level: Associate degree: occupational, Scientist, product/process development, or vocational program  Occupational History   Occupation: Naval architect    Comment: part time  Tobacco Use   Smoking status: Never   Smokeless tobacco: Never  Vaping Use   Vaping status: Never Used  Substance and Sexual Activity   Alcohol use: Not Currently    Alcohol/week: 0.0 standard drinks  of alcohol    Comment: rarely   Drug use: No   Sexual activity: Yes    Birth control/protection: None  Other Topics Concern   Not on file  Social History Narrative   Lives with Information systems manager and wife   Social Drivers of Corporate investment banker Strain: Low Risk  (02/06/2023)   Overall Financial Resource Strain (CARDIA)    Difficulty of Paying Living Expenses: Not hard at all  Food Insecurity: No Food Insecurity (02/06/2023)   Hunger Vital Sign    Worried About Running Out of Food in the Last Year: Never true    Ran Out of Food in the Last Year: Never true  Transportation Needs: No Transportation Needs (02/06/2023)   PRAPARE - Administrator, Civil Service (Medical): No    Lack of Transportation (Non-Medical): No  Physical Activity: Sufficiently Active (02/06/2023)   Exercise Vital Sign    Days of Exercise per Week: 4 days    Minutes of Exercise per Session: 60 min  Stress: No Stress Concern Present (02/06/2023)   [employer] of Occupational Health - Occupational Stress Questionnaire    Feeling of Stress : Not at all  Social Connections: Moderately Isolated (02/06/2023)   Social Connection and Isolation Panel [NHANES]    Frequency of Communication with Friends and Family: More than three times a week    Frequency of Social Gatherings with Friends and Family: More than three times a week    Attends Religious Services: Never    Database administrator or Organizations: No    Attends Engineer, structural: Never    Marital Status: Married     Family History: The patient's family history includes CAD in his father, maternal uncle, and paternal uncle; Heart Problems in his sister; Heart attack in his brother, brother, brother, brother, father, and sister.  ROS:   Please see the history of present illness.     All other systems reviewed and are negative.  EKGs/Labs/Other Studies Reviewed:    The following studies were reviewed today:  ETT 05/2021   Baseline  EKG shows normal sinus rhythm with frequent PVCs and nonspecific T wave changes.   Patient demonstrates good exercise capacity, achieving a maximum heart rate of 131 bpm and exercising for 8:59 minutes (completing stage III; 10.1 METS).  Hypertensive blood pressure response noted (max BP 241/102).  No angina reported during stress.   No significant ST segment or T wave abnormalities observed during stress.   Rare PACs observed during stress.  Rare PVCs noted during recovery.   Low risk exercise tolerance test (Duke Treadmill Score = +9).   Low risk exercise tolerance test without evidence of inducible ischemia.  Frequent PVCs observed at rest that cease with stress.  Hypertensive blood pressure response also noted.   Myoview lexiscan 2017 Narrative & Impression  No T wave inversion was noted during stress. There was no ST segment deviation noted during stress. Defect 1: There is a medium defect of severe severity present in the basal inferoseptal, basal inferior, mid inferoseptal and mid inferior location. Findings consistent with prior inferior myocardial infarction without ischemia. This is a low risk study. The left ventricular ejection fraction is normal (55-65%). excellent exercise capacity.      EKG:  EKG is ordered today.  The ekg ordered today demonstrates NSR 1st degree AVB PRI 22 ms, PVCs, nonspecific ST changes, inf q waves  Recent Labs: 08/17/2023: ALT 46; BUN 24; Creatinine, Ser 1.19; Hemoglobin 15.3; Platelets 216; Potassium 4.7; Sodium 141  Recent Lipid Panel    Component Value Date/Time   CHOL 145 08/17/2023 0942   TRIG 234 (H) 08/17/2023 0942   HDL 33 (L) 08/17/2023 0942   CHOLHDL 4.4 08/17/2023 0942   CHOLHDL 5.1 05/28/2020 1011   VLDL 48 (H) 05/28/2020 1011   LDLCALC 73 08/17/2023 0942     Physical Exam:    VS:  BP 120/60 (BP Location: Left Arm, Patient Position: Sitting, Cuff Size: Normal)   Pulse 75   Ht 5\' 6"  (1.676 m)   Wt 196 lb (88.9 kg)   SpO2 97%    BMI 31.64 kg/m     Wt Readings from Last 3 Encounters:  08/22/23 196 lb (88.9 kg)  08/17/23 189 lb 12.8 oz (86.1 kg)  06/11/23 193 lb (87.5 kg)     GEN:  Well nourished, well developed in no acute distress HEENT: Normal NECK: No JVD; No carotid bruits LYMPHATICS: No lymphadenopathy CARDIAC: RRR, no murmurs, rubs, gallops RESPIRATORY:  Clear to  auscultation without rales, wheezing or rhonchi  ABDOMEN: Soft, non-tender, non-distended MUSCULOSKELETAL:  No edema; No deformity  SKIN: Warm and dry NEUROLOGIC:  Alert and oriented x 3 PSYCHIATRIC:  Normal affect   ASSESSMENT:    1. PVC (premature ventricular contraction)   2. Coronary artery disease involving native coronary artery of native heart without angina pectoris   3. Hyperlipidemia, mixed   4. Essential hypertension    PLAN:    In order of problems listed above:  CAD s/p remote CABG Patient denies anginal symptoms.  He does no formal activity.  Patient retired in the last year but tries to stay active at home.  No further ischemic workup at this time continue aspirin  81 mg daily, Lipitor  80 mg daily, Vascepa  2 g twice daily, lisinopril  40 mg daily.  I will refill sublingual nitro today.  Hyperlipidemia Hypertriglyceridemia Most recent labs show total cholesterol 145, triglycerides 234, HDL 33, LDL 73.  Continue Lipitor  80 mg daily and Vascepa  2 g twice a day.  I discussed lipid clinic, but patient would like to defer at this time.  HTN Blood pressure is well-controlled today, continue current medications.  PVCs EKG shows normal sinus rhythm with first-degree AV block and PVCs.  I will order a 2-week heart monitor.  Disposition: Follow up in 1 year(s) with MD/APP   Signed, Cena Bruhn Rebekah Canada, PA-C  08/22/2023 9:45 AM    [facility]

## 2023-08-22 NOTE — Patient Instructions (Signed)
 Medication Instructions:   Your Physician recommend you continue on your current medication as directed.     *If you need a refill on your cardiac medications before your next appointment, please call your pharmacy*   Lab Work: None ordered.  If you have labs (blood work) drawn today and your tests are completely normal, you will receive your results only by: MyChart Message (if you have MyChart) OR A paper copy in the mail If you have any lab test that is abnormal or we need to change your treatment, we will call you to review the results.   Testing/Procedures: Heart Monitor:  Your physician has requested you wear a ZIO monitor for 14  days.  Your monitor will be mailed to your home address within 3-5 business days. This is sent via [employer] from [employer]. However, if you have not received your monitor after 5 business days please send us  a MyChart message or call the office at [phone], so we may follow up on this for you.   This monitor is a medical device (single patch monitor) that records the heart's electrical activity. Doctors most often use these monitors to diagnose arrhythmias. Arrhythmias are problems with the speed or rhythm of the heartbeat.   [employer] supplies 1 patch per enrollment. Additional stickers are not available.  Please DO NOT apply the patch if you will be having a Nuclear Stress Test, Echocardiogram, Cardiac CT, Cardiac MRI, Chest X-ray during the period you would be wearing the monitor. The patch cannot be worn during these tests.  You cannot remove and re-apply the ZIO patch monitor.   Applying the Monitor: Once you receive your monitor, this will include a small razor, abrader, and 4 alcohol pads. Shave hair from upper left chest Rub abrader disc in 40 strokes over the left upper chest as indicated in your monitor instructions Clean area with 4 enclosed alcohol pads (there may be a mild & brief stinging sensation over the newly abraded  area, but this is normal). Let dry Apply patch as indicated in monitor instructions. Patch will be placed under collarbone on the left side of the chest with arrow pointing upward. Rub adhesive wings for 2 minutes. Remove white label marked "1". Remove the white label marked "2". Rub patch adhesive wings for an 2 minutes.  While looking in a mirror, press and release button in the center of the patch. You may hear a "click". A small green light will flash 4-6 times and then stop. This will be your indicator that the monitor has been turned on.  Wearing the Monitor: Avoid showering during the first 24 hours of wearing the monitor.  After 24 hours you may shower with the patch on. Take brief showers with your back facing the shower head.  Avoid excessive sweating to help maximize wear time. Do not submerge the device, no hot tubs, and no swimming pools. Keep any lotions or oils away from the patch. Press the button if you feel a symptom. You will hear a small click. Record date, time, and symptoms in the Patient Logbook or App.  Monitor Issues: Call [employer] Customer Care at [phone] if you have questions regarding your Zio Patch Monitor. Call them immediately if you see an orange/ amber colored light blinking on your monitor. If your monitor falls off and you cannot get this reapplied or if you need suggestions for securing your monitor call [employer] at [phone].   Returning the Monitor: Once you have completed  wearing your monitor, follow instructions on the last 2 pages of the Patient Logbook. Stick monitor patch on to the last page of the Patient Logbook.  Place Patient Logbook with monitor in the return box provided. Use locking tab on box and tape box closed securely. The return box has pre-paid postage on it.  Place the return box in the regular [place]  Mail box as soon as possible It will take anywhere from 1-2 weeks for your provider to receive and review your results  once you mail this back. If for some reason you have misplaced your return box then call our office and we can provide another box and/or mail it off for you.   Billing  and Patient Assistance Program Information: We have supplied [employer] with any of your insurance information on file for billing purposes. [employer] offers a sliding scale Patient Assistance Program for patients that do not have insurance, or whose insurance does not completely cover the cost of the ZIO monitor. You must apply for the Patient Assistance Program to qualify for this discounted rate. To apply, please call [employer] at [phone], select option 1, ask to apply for the Patient Assistance Program. [employer] will ask your household income, and how many people are in your household. They will quote your out-of-pocket cost based on that information. [employer] will also be able to set up for a 52-month, interest-free payment plan if needed.      Follow-Up: At [facility], you and your health needs are our priority.  As part of our continuing mission to provide you with exceptional heart care, we have created designated Provider Care Teams.  These Care Teams include your primary Cardiologist (physician) and Advanced Practice Providers (APPs -  Physician Assistants and Nurse Practitioners) who all work together to provide you with the care you need, when you need it.  We recommend signing up for the patient portal called "MyChart".  Sign up information is provided on this After Visit Summary.  MyChart is used to connect with patients for Virtual Visits (Telemedicine).  Patients are able to view lab/test results, encounter notes, upcoming appointments, etc.  Non-urgent messages can be sent to your provider as well.   To learn more about what you can do with MyChart, go to [email or web address].    Your next appointment:   1 year(s)  Provider:   You may see Antionette Kirks, MD or one of the following Advanced Practice  Providers on your designated Care Team:   Laneta Pintos, NP Varney Gentleman, PA-C Cadence Gennaro Khat, PA-C Ronald Cockayne, NP Morey Ar, NP

## 2023-08-27 ENCOUNTER — Other Ambulatory Visit: Payer: Self-pay | Admitting: Cardiovascular Disease

## 2023-08-27 DIAGNOSIS — I493 Ventricular premature depolarization: Secondary | ICD-10-CM | POA: Diagnosis not present

## 2023-08-27 DIAGNOSIS — I10 Essential (primary) hypertension: Secondary | ICD-10-CM

## 2023-09-19 DIAGNOSIS — I493 Ventricular premature depolarization: Secondary | ICD-10-CM | POA: Diagnosis not present

## 2023-09-26 ENCOUNTER — Other Ambulatory Visit: Payer: Self-pay | Admitting: Cardiovascular Disease

## 2023-10-01 ENCOUNTER — Other Ambulatory Visit: Payer: Self-pay

## 2023-10-01 DIAGNOSIS — I493 Ventricular premature depolarization: Secondary | ICD-10-CM

## 2023-10-01 MED ORDER — METOPROLOL SUCCINATE ER 25 MG PO TB24: 12.5000 mg | ORAL_TABLET | Freq: Every day | ORAL | 3 refills | Status: DC

## 2023-10-02 ENCOUNTER — Telehealth: Payer: Self-pay | Admitting: Medical

## 2023-10-02 NOTE — Telephone Encounter (Signed)
 Left voicemail to schedule echo & fu appts, please schedule.

## 2023-10-02 NOTE — Telephone Encounter (Signed)
-----   Message from Nurse Kathrene Alu sent at 10/01/2023  4:09 PM EST ----- Please call pt to schedule ECHO and EP appointment

## 2023-10-03 ENCOUNTER — Other Ambulatory Visit: Payer: Self-pay | Admitting: Medical

## 2023-10-03 DIAGNOSIS — I493 Ventricular premature depolarization: Secondary | ICD-10-CM

## 2023-10-08 ENCOUNTER — Ambulatory Visit: Payer: PPO | Attending: Medical

## 2023-10-08 DIAGNOSIS — I493 Ventricular premature depolarization: Secondary | ICD-10-CM

## 2023-10-08 LAB — ECHOCARDIOGRAM COMPLETE
AV Mean grad: 3 mmHg
AV Peak grad: 4.9 mmHg
Ao pk vel: 1.11 m/s
Area-P 1/2: 3.91 cm2
S' Lateral: 3.4 cm

## 2023-10-16 ENCOUNTER — Ambulatory Visit: Payer: PPO | Attending: Cardiology | Admitting: Cardiology

## 2023-10-16 ENCOUNTER — Telehealth: Payer: Self-pay | Admitting: Pharmacist

## 2023-10-16 ENCOUNTER — Encounter: Payer: Self-pay | Admitting: Cardiology

## 2023-10-16 VITALS — BP 106/60 | HR 65 | Ht 66.0 in | Wt 196.6 lb

## 2023-10-16 DIAGNOSIS — I2510 Atherosclerotic heart disease of native coronary artery without angina pectoris: Secondary | ICD-10-CM | POA: Diagnosis not present

## 2023-10-16 DIAGNOSIS — I493 Ventricular premature depolarization: Secondary | ICD-10-CM | POA: Diagnosis not present

## 2023-10-16 DIAGNOSIS — I10 Essential (primary) hypertension: Secondary | ICD-10-CM | POA: Diagnosis not present

## 2023-10-16 MED ORDER — METOPROLOL SUCCINATE ER 25 MG PO TB24: 25.0000 mg | ORAL_TABLET | Freq: Every day | ORAL | 3 refills | Status: AC

## 2023-10-16 NOTE — Progress Notes (Signed)
   10/16/2023  Patient ID: [name], male   DOB: 04-Jun-1951, 73 y.o.   MRN: [id number]  Called and spoke with the patient's wife on the phone today. Reports issues affording the icosapent ethyl. This is $100 monthly. Patient would prefer to continue this medication if possible.   Completed PAP for medication through [employer]. Information added in Active FYI section in left panel.    Called [facility] to provide [employer].   Ricka Burdock, PharmD [facility] Phone Number: [phone]

## 2023-10-16 NOTE — Progress Notes (Signed)
 Electrophysiology Office Note:   Date:  10/16/2023  ID:  [name], DOB 07/24/1951, MRN [id number]  Primary Cardiologist: Lorine Bears, MD Electrophysiologist: Nobie Putnam, MD      History of Present Illness:    CC: [name] is a 73 y.o. male with h/o CAD status post CABG September 2016, mixed hyperlipidemia, and ischemic cardiomyopathy who is being seen today for evaluation of PVCs at the request of Cadence Furth, [place].   Discussed the use of AI scribe software for clinical note transcription with the patient, who gave verbal consent to proceed.  History of Present Illness   The patient presents today to discuss abnormal findings on a heart monitor. The patient presented to cardiology clinic for standard follow up visit and had PVCs on an ECG. He wore a Zio which had premature ventricular contractions (PVCs) 9.3% of the time. The patient reports no known symptoms related to the PVCs. He is able to carry out daily activities, including yard work, without any significant issues. The patient reports occasional lightheadedness with very strenuous exertion but not on a regular basis. The patient has been taking half a pill of metoprolol, which was recently prescribed, without any reported issues. No new or acute complaints.      Review of systems complete and found to be negative unless listed in HPI.   EP Information / Studies Reviewed:    EKG is ordered today. Personal review as below.     EKG 08/22/23: Sinus rhythm with first degree AV delay and PVCs.    Echo 10/08/23:  1. Left ventricular ejection fraction, by estimation, is 50 to 55%. The  left ventricle has low normal function. The left ventricle has no regional  wall motion abnormalities. There is moderate asymmetric left ventricular  hypertrophy of the basal-septal segment. Left ventricular diastolic parameters are consistent with Grade I  diastolic dysfunction (impaired relaxation). The average left ventricular   global longitudinal strain is -17.4 %. The global longitudinal strain is  normal.   2. Right ventricular systolic function is normal. The right ventricular  size is normal.   3. The mitral valve is normal in structure. Trivial mitral valve  regurgitation.   4. The aortic valve is tricuspid. Aortic valve regurgitation is not  visualized.   5. Aortic dilatation noted. There is mild dilatation of the aortic root,  measuring 42 mm.   6. The inferior vena cava is normal in size with greater than 50%  respiratory variability, suggesting right atrial pressure of 3 mmHg.   Zio 09/24/23: Patch Wear Time:  13 days and 19 hours (2025-01-20T12:30:35-0500 to 2025-02-03T08:27:53-0500)   Patient had a min HR of 58 bpm, max HR of 180 bpm, and avg HR of 82 bpm. Predominant underlying rhythm was Sinus Rhythm. 6 Supraventricular Tachycardia runs occurred, the run with the fastest interval lasting 4 beats with a max rate of 154 bpm, the  longest lasting 12 beats with an avg rate of 90 bpm. Some episodes of Supraventricular Tachycardia may be possible Atrial Tachycardia with variable block.  Occasional PACs with a burden of 1%. Frequent PVCs with a burden of 9.3%.  Physical Exam:   VS:  BP 106/60   Pulse 65   Ht 5\' 6"  (1.676 m)   Wt 196 lb 9.6 oz (89.2 kg)   SpO2 95%   BMI 31.73 kg/m    Wt Readings from Last 3 Encounters:  10/16/23 196 lb 9.6 oz (89.2 kg)  08/22/23 196 lb (88.9 kg)  08/17/23 189 lb 12.8 oz (86.1 kg)     GEN: Well nourished, well developed in no acute distress NECK: No JVD CARDIAC: Normal rate, regular rhythm.  RESPIRATORY:  Clear to auscultation without rales, wheezing or rhonchi  ABDOMEN: Soft, non-distended EXTREMITIES:  No edema; No deformity   ASSESSMENT AND PLAN:    #Frequent PVCs: RBBB morphology, V2 transition, left inferior axis. Etiology not classic, but has some characteristics of outflow tract origin, Q waves inferiorly d/t prior MI.  - LVEF 50-55%.  - He appears  to be asymptomatic.  - He had been started on metoprolol XL 12.5mg  once daily based on the results of Zio. He is tolerating this medication. He has no PVCs on his ECGs today, possibly decreased burden since starting beta blocker. We will increase to metoprolol XL 25mg  once daily.  - No need for anti-arrhythmic drug therapy or catheter ablation in the absence of symptoms and in setting of normal EF. If these things were to change then can readdress treatment options.  #CAD s/p CABG: No chest pain.  -Continue aspirin 81mg  once daily and atorvastatin 80mg  once daily.  #Hypertension -At goal today.  Recommend checking blood pressures 1-2 times per week at home and recording the values.  Recommend bringing these recordings to the primary care physician.  Follow up with Dr. Jimmey Ralph  as needed.   Signed, Nobie Putnam, MD

## 2023-10-16 NOTE — Patient Instructions (Signed)
 Medication Instructions:  Your physician has recommended you make the following change in your medication:  1) INCREASE Toprol XL (metoprolol succinate) to 25 mg daily  *If you need a refill on your cardiac medications before your next appointment, please call your pharmacy*  Follow-Up: At [facility], you and your health needs are our priority.  As part of our continuing mission to provide you with exceptional heart care, we have created designated Provider Care Teams.  These Care Teams include your primary Cardiologist (physician) and Advanced Practice Providers (APPs -  Physician Assistants and Nurse Practitioners) who all work together to provide you with the care you need, when you need it.  Your next appointment:   As needed with Dr. Jimmey Ralph

## 2024-02-05 ENCOUNTER — Encounter

## 2024-02-07 ENCOUNTER — Telehealth: Payer: Self-pay | Admitting: Medical

## 2024-02-07 ENCOUNTER — Telehealth: Payer: Self-pay | Admitting: Family Medicine

## 2024-02-07 NOTE — Telephone Encounter (Signed)
 He has coronary artery disease and prediabetes. He does not have congestive heart failure.

## 2024-02-07 NOTE — Telephone Encounter (Signed)
 Copied from CRM [id number]. Topic: Clinical - Medical Advice >> Feb 07, 2024 10:22 AM Tiffini S wrote: Reason for CRM: Patient spouse called about her husband having a quadruple heart bypass. Said the insurance with [employer] has been cancelled- a letter was mailed to Dr. Gasper and Almarie Pouch is waiting on the doctor's office to call her back at her office# [phone]/ mobile#  [phone]  Patient have a appointment scheduled with Dr. Gasper for February 12, 2024   Fax number [phone] for [employer]             Also the patient spouse is very concerns/ worried and is asking for a phone call back with an update at [phone].

## 2024-02-07 NOTE — Telephone Encounter (Signed)
 Copied from CRM [id number]. Topic: General - Other >> Feb 07, 2024 10:07 AM DeAngela L wrote: Reason for CRM: Insurance agent Almarie calling to inform Dr Gasper that the patient has been kicked off his plan  [employer]  cause the information didn't not get verified that he has congestive heart failure from the provider and asking if Dr Gasper could help the patient URGENTLY get this information so he could get back on the plan Fax number [phone] for [employer]

## 2024-02-07 NOTE — Telephone Encounter (Signed)
 I have no idea what this about.

## 2024-02-07 NOTE — Telephone Encounter (Signed)
 Spoke with insurance agent and advised pt has CAD not CHF. Advised that I could not give any information without release form and she verbalized understanding.

## 2024-02-07 NOTE — Telephone Encounter (Signed)
 Pt was kicked out of [employer] insurance due to not being able to verfiy if pt has congestive heart failure. She states they did reach out to PCP about it, however she said it has to come from Cardiology. She stated this is rather urgent, so if at all possible she would like a c/b today.

## 2024-02-07 NOTE — Telephone Encounter (Signed)
 Dr.Fisher, I spoke with Almarie from [facility] and she is asking if patient has heart problems. Congestive hear problem or history of surgery/heart bypass. The wife is saying patient had a quadruple heart by pass. The health team form was filled back in April is under the media tab. Please review the form. I have requested for a new form to be fax to us . Almarie said they can take a verbal to reactivate the insurance.

## 2024-02-07 NOTE — Telephone Encounter (Signed)
 Duplicate encounter. Please see other encounter and provider's message on this.

## 2024-02-07 NOTE — Telephone Encounter (Signed)
 Copied from CRM [id number]. Topic: General - Other >> Feb 07, 2024 10:12 AM Zebedee SAUNDERS wrote: Reason for CRM: Pt's wife Dianne H. Newnam ph: [phone] calling on behalf of pt regarding letter sent from [facility] [employer] PPO. Please call Dianne H. Dexheimer ph: [phone].

## 2024-02-07 NOTE — Telephone Encounter (Signed)
 [name] and gave her Dr.Fisher's message. [name] from [employer] is going to call patient.

## 2024-02-07 NOTE — Telephone Encounter (Signed)
 LM with Hadassah from [employer] for [name] to get more information and to see when was the letter sent/or mailed to us ?

## 2024-02-12 ENCOUNTER — Ambulatory Visit (INDEPENDENT_AMBULATORY_CARE_PROVIDER_SITE_OTHER)

## 2024-02-12 DIAGNOSIS — Z0000 Encounter for general adult medical examination without abnormal findings: Secondary | ICD-10-CM | POA: Diagnosis not present

## 2024-02-12 NOTE — Progress Notes (Signed)
 Subjective:   [name] is a 73 y.o. who presents for a Medicare Wellness preventive visit.  As a reminder, Annual Wellness Visits don't include a physical exam, and some assessments may be limited, especially if this visit is performed virtually. We may recommend an in-person follow-up visit with your provider if needed.  Visit Complete: Virtual I connected with  [name] on 02/12/24 by a audio enabled telemedicine application and verified that I am speaking with the correct person using two identifiers.  Patient Location: Home  Provider Location: Office/Clinic  I discussed the limitations of evaluation and management by telemedicine. The patient expressed understanding and agreed to proceed.  Vital Signs: Because this visit was a virtual/telehealth visit, some criteria may be missing or patient reported. Any vitals not documented were not able to be obtained and vitals that have been documented are patient reported.  VideoDeclined- This patient declined Librarian, academic. Therefore the visit was completed with audio only.  Persons Participating in Visit: Patient.  AWV Questionnaire: No: Patient Medicare AWV questionnaire was not completed prior to this visit.  Cardiac Risk Factors include: advanced age (>54men, >16 women);hypertension;dyslipidemia;male gender     Objective:    There were no vitals filed for this visit. There is no height or weight on file to calculate BMI.     02/12/2024    8:18 AM 02/06/2023    9:02 AM 06/02/2022   12:09 PM 04/15/2020    8:38 AM 10/07/2019    7:24 AM 09/16/2019    8:02 AM 04/10/2019    8:39 AM  Advanced Directives  Does Patient Have a Medical Advance Directive? No No No No No No No  Would patient like information on creating a medical advance directive? No - Patient declined   No - Patient declined No - Patient declined No - Patient declined No - Patient declined    Current Medications  (verified) Outpatient Encounter Medications as of 02/12/2024  Medication Sig   aspirin  81 MG EC tablet Take 1 tablet (81 mg total) by mouth daily.   atorvastatin  (LIPITOR ) 80 MG tablet Take 1 tablet by mouth once daily   ENSURE PLUS (ENSURE PLUS) LIQD Take 237 mLs by mouth daily.   icosapent  Ethyl (VASCEPA ) 1 g capsule Take 2 capsules by mouth twice daily   lisinopril  (ZESTRIL ) 40 MG tablet Take 1 tablet by mouth once daily   metoprolol  succinate (TOPROL  XL) 25 MG 24 hr tablet Take 1 tablet (25 mg total) by mouth daily.   Multiple Vitamin (MULTIVITAMIN) tablet Take 1 tablet by mouth every evening.    nitroGLYCERIN  (NITROSTAT ) 0.4 MG SL tablet Place 1 tablet (0.4 mg total) under the tongue every 5 (five) minutes as needed for chest pain.   traMADol  (ULTRAM ) 50 MG tablet Take 50 mg by mouth every 6 (six) hours as needed.   No facility-administered encounter medications on file as of 02/12/2024.    Allergies (verified) Lovastatin and Pravastatin   History: Past Medical History:  Diagnosis Date   CAD (coronary artery disease)    a. CT scan 01/02/2015: extensive coronary atherosclerosis;  b. 04/2015 Inf STEMI/Cath/CABG x 4: LIMA->LAD, VG->Diag, VG->OM2, VG->RPL; c. 09/2015 MV: EF 55-65%, infsept/inf infarct w/o ischemia->Low Risk.   Colon polyps    Complete heart block (HCC)    a. 04/2015 in setting of inferior STEMI-->resolved.   Difficult intubation    History of kidney stones    Hyperlipidemia    Hypertension    Ischemic  cardiomyopathy    a. 04/2015 LV gram: EF 35-40%; b. 09/2015 MV: EF 55-65%.   MI (myocardial infarction) (HCC) 04/2015   Postoperative atrial fibrillation (HCC)    a. 04/2015 Post-op CABG AF/AFlutter--> converted with rapid atrial pacing and amio; b. CHA2DS2VASc = 3->Coumadin .   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   Unilateral inguinal hernia without obstruction or gangrene 04/29/2015   Ureterolithiasis 01/12/2015   Past Surgical History:  Procedure Laterality Date   CARDIAC  CATHETERIZATION N/A 05/03/2015   Procedure: Left Heart Cath and Coronary Angiography;  Surgeon: Deatrice DELENA Cage, MD;  Location: [facility] INVASIVE CV LAB;  Service: Cardiovascular;  Laterality: N/A;   CATARACT EXTRACTION, BILATERAL     [facility]   COLONOSCOPY WITH PROPOFOL  N/A 06/02/2022   Procedure: COLONOSCOPY WITH PROPOFOL ;  Surgeon: Maryruth Ole DASEN, MD;  Location: [facility] ENDOSCOPY;  Service: Endoscopy;  Laterality: N/A;   CORONARY ARTERY BYPASS GRAFT N/A 05/03/2015   Procedure: CORONARY ARTERY BYPASS GRAFTING (CABG) times four using left internal mammary artery and right saphenous vein.;  Surgeon: Elspeth JAYSON Millers, MD;  Location: [facility] OR;  Service: Open Heart Surgery;  Laterality: N/A;   CYSTOSCOPY WITH STENT PLACEMENT Left 09/16/2019   Procedure: CYSTOSCOPY WITH STENT PLACEMENT;  Surgeon: Twylla Glendia JAYSON, MD;  Location: [facility] ORS;  Service: Urology;  Laterality: Left;   CYSTOSCOPY/RETROGRADE/URETEROSCOPY Left 09/16/2019   Procedure: CYSTOSCOPY/RETROGRADE/URETEROSCOPY;  Surgeon: Twylla Glendia JAYSON, MD;  Location: [facility] ORS;  Service: Urology;  Laterality: Left;   CYSTOSCOPY/URETEROSCOPY/HOLMIUM LASER/STENT PLACEMENT Left 10/07/2019   Procedure: CYSTOSCOPY/URETEROSCOPY/HOLMIUM LASER/STENT Exhange;  Surgeon: Twylla Glendia JAYSON, MD;  Location: [facility] ORS;  Service: Urology;  Laterality: Left;   EYE SURGERY     HERNIA REPAIR     ventral hernia repair   JOINT REPLACEMENT     KNEE SURGERY Left    arthroscopic knee surgery   NECK SURGERY     Spinal fusion- neck. Dr. Alm Molt of [place] [facility])   right hip replacement Right 02/04/2014   Dr. Cleotilde   TEE WITHOUT CARDIOVERSION N/A 05/03/2015   Procedure: TRANSESOPHAGEAL ECHOCARDIOGRAM (TEE);  Surgeon: Elspeth JAYSON Millers, MD;  Location: [facility] OR;  Service: Open Heart Surgery;  Laterality: N/A;   Family History  Problem Relation Age of Onset   CAD Father    Heart attack Father    Heart Problems Sister        CABG    Heart attack Brother    Heart attack Brother    Heart attack Brother    Heart attack Brother    Heart attack Sister    CAD Maternal Uncle    CAD Paternal Uncle    Social History   Socioeconomic History   Marital status: Married    Spouse name: Not on file   Number of children: 1   Years of education: Not on file   Highest education level: Associate degree: occupational, Scientist, product/process development, or vocational program  Occupational History   Occupation: Naval architect    Comment: part time  Tobacco Use   Smoking status: Never   Smokeless tobacco: Never  Vaping Use   Vaping status: Never Used  Substance and Sexual Activity   Alcohol use: Not Currently    Alcohol/week: 0.0 standard drinks of alcohol    Comment: rarely   Drug use: No   Sexual activity: Yes    Birth control/protection: None  Other Topics Concern   Not on file  Social History Narrative   Lives with Information systems manager and wife   Social  Drivers of Health   Financial Resource Strain: Low Risk  (02/12/2024)   Overall Financial Resource Strain (CARDIA)    Difficulty of Paying Living Expenses: Not very hard  Food Insecurity: No Food Insecurity (02/12/2024)   Hunger Vital Sign    Worried About Running Out of Food in the Last Year: Never true    Ran Out of Food in the Last Year: Never true  Transportation Needs: No Transportation Needs (02/12/2024)   PRAPARE - Administrator, Civil Service (Medical): No    Lack of Transportation (Non-Medical): No  Physical Activity: Sufficiently Active (02/12/2024)   Exercise Vital Sign    Days of Exercise per Week: 7 days    Minutes of Exercise per Session: 30 min  Stress: No Stress Concern Present (02/12/2024)   [employer] of Occupational Health - Occupational Stress Questionnaire    Feeling of Stress: Not at all  Social Connections: Moderately Isolated (02/12/2024)   Social Connection and Isolation Panel    Frequency of Communication with Friends and Family: More than three times a week     Frequency of Social Gatherings with Friends and Family: More than three times a week    Attends Religious Services: Never    Database administrator or Organizations: No    Attends Engineer, structural: Never    Marital Status: Married    Tobacco Counseling Counseling given: Not Answered    Clinical Intake:  Pre-visit preparation completed: Yes  Pain : No/denies pain     Nutritional Risks: None Diabetes: No  Lab Results  Component Value Date   HGBA1C 7.3 (H) 08/17/2023   HGBA1C 6.5 (H) 02/12/2023   HGBA1C 6.0 (H) 08/11/2022     How often do you need to have someone help you when you read instructions, pamphlets, or other written materials from your doctor or pharmacy?: 1 - Never  Interpreter Needed?: No  Information entered by :: JHONNIE DAS, LPN   Activities of Daily Living     02/12/2024    8:19 AM  In your present state of health, do you have any difficulty performing the following activities:  Hearing? 0  Vision? 0  Difficulty concentrating or making decisions? 0  Walking or climbing stairs? 0  Dressing or bathing? 0  Doing errands, shopping? 0  Preparing Food and eating ? N  Using the Toilet? N  In the past six months, have you accidently leaked urine? N  Do you have problems with loss of bowel control? N  Managing your Medications? N  Managing your Finances? N  Housekeeping or managing your Housekeeping? N    Patient Care Team: [name], MD as PCP - General (Family Medicine) Darron Deatrice LABOR, MD as PCP - Cardiology (Cardiology) Kennyth Chew, MD as PCP - Electrophysiology (Cardiology) Cleotilde Barrio, MD as Consulting Physician (Specialist) Twylla Glendia BROCKS, MD (Urology) Hester Alm BROCKS, MD (Dermatology) Pa, [facility] (Optometry)  I have updated your Care Teams any recent Medical Services you may have received from other providers in the past year.     Assessment:   This is a routine wellness examination for  [name].  Hearing/Vision screen Hearing Screening - Comments:: NO AIDS Vision Screening - Comments:: [facility]- [facility]   Goals Addressed             This Visit's Progress    DIET - INCREASE WATER INTAKE         Depression Screen     02/12/2024  8:17 AM 08/17/2023    8:43 AM 06/11/2023    1:40 PM 02/12/2023    8:07 AM 02/06/2023    9:00 AM 08/11/2022    8:59 AM 05/05/2022    8:13 AM  PHQ 2/9 Scores  PHQ - 2 Score 0 0 2 0 0 0 0  PHQ- 9 Score 0  10 0  0 0    Fall Risk     02/12/2024    8:19 AM 08/17/2023    8:43 AM 02/12/2023    8:07 AM 02/06/2023    8:54 AM 08/11/2022    8:59 AM  Fall Risk   Falls in the past year? 0 1 0 0 0  Number falls in past yr: 0 1 0 0 0  Injury with Fall? 0 0 0 0 0  Risk for fall due to : No Fall Risks Other (Comment)  No Fall Risks No Fall Risks  Follow up Falls evaluation completed Falls evaluation completed  Falls prevention discussed;Education provided Falls evaluation completed      Data saved with a previous flowsheet row definition    MEDICARE RISK AT HOME:  Medicare Risk at Home Any stairs in or around the home?: Yes If so, are there any without handrails?: No Home free of loose throw rugs in walkways, pet beds, electrical cords, etc?: Yes Adequate lighting in your home to reduce risk of falls?: Yes Life alert?: No Use of a cane, walker or w/c?: No Grab bars in the bathroom?: Yes Shower chair or bench in shower?: Yes Elevated toilet seat or a handicapped toilet?: Yes  TIMED UP AND GO:  Was the test performed?  No  Cognitive Function: 6CIT completed        02/12/2024    8:21 AM 02/06/2023    9:03 AM 02/23/2017    9:15 AM  6CIT Screen  What Year? 0 points 0 points 0 points  What month? 0 points 0 points 0 points  What time? 0 points 0 points 0 points  Count back from 20 0 points 0 points 0 points  Months in reverse 0 points 0 points 0 points  Repeat phrase 0 points 0 points 0 points  Total Score 0 points 0 points 0 points     Immunizations Immunization History  Administered Date(s) Administered   Fluad Quad(high Dose 65+) 05/02/2019   Influenza, High Dose Seasonal PF 06/02/2017, 04/05/2018   Influenza-Unspecified 05/07/2015   PFIZER(Purple Top)SARS-COV-2 Vaccination 05/01/2020   Pneumococcal Conjugate-13 09/15/2015   Pneumococcal Polysaccharide-23 02/23/2017   Tdap 06/19/2008   Zoster, Live 09/15/2015    Screening Tests Health Maintenance  Topic Date Due   Zoster Vaccines- Shingrix  (1 of 2) 06/16/1968   DTaP/Tdap/Td (2 - Td or Tdap) 06/19/2018   COVID-19 Vaccine (2 - Pfizer risk series) 05/22/2020   INFLUENZA VACCINE  03/07/2024   Medicare Annual Wellness (AWV)  02/11/2025   Colonoscopy  06/02/2025   Pneumococcal Vaccine: 50+ Years  Completed   Hepatitis C Screening  Completed   Hepatitis B Vaccines  Aged Out   HPV VACCINES  Aged Out   Meningococcal B Vaccine  Aged Out    Health Maintenance  Health Maintenance Due  Topic Date Due   Zoster Vaccines- Shingrix  (1 of 2) 06/16/1968   DTaP/Tdap/Td (2 - Td or Tdap) 06/19/2018   COVID-19 Vaccine (2 - Pfizer risk series) 05/22/2020   Health Maintenance Items Addressed: NEEDS SHINGRIX , TDAP, COVID & PNA; DECLINES COLONOSCOPY  Additional Screening:  Vision Screening: Recommended annual ophthalmology  exams for early detection of glaucoma and other disorders of the eye. Would you like a referral to an eye doctor? No    Dental Screening: Recommended annual dental exams for proper oral hygiene  Community Resource Referral / Chronic Care Management: CRR required this visit?  No   CCM required this visit?  No   Plan:    I have personally reviewed and noted the following in the patient's chart:   Medical and social history Use of alcohol, tobacco or illicit drugs  Current medications and supplements including opioid prescriptions. Patient is not currently taking opioid prescriptions. Functional ability and status Nutritional  status Physical activity Advanced directives List of other physicians Hospitalizations, surgeries, and ER visits in previous 12 months Vitals Screenings to include cognitive, depression, and falls Referrals and appointments  In addition, I have reviewed and discussed with patient certain preventive protocols, quality metrics, and best practice recommendations. A written personalized care plan for preventive services as well as general preventive health recommendations were provided to patient.   Jhonnie GORMAN Das, LPN   09/07/7972   After Visit Summary: (MyChart) Due to this being a telephonic visit, the after visit summary with patients personalized plan was offered to patient via MyChart   Notes: Nothing significant to report at this time.

## 2024-02-12 NOTE — Patient Instructions (Addendum)
 Mr. Denk , Thank you for taking time out of your busy schedule to complete your Annual Wellness Visit with me. I enjoyed our conversation and look forward to speaking with you again next year. I, as well as your care team,  appreciate your ongoing commitment to your health goals. Please review the following plan we discussed and let me know if I can assist you in the future.  Follow up Visits: Next Medicare AWV with our clinical staff:   02/17/25 @ 8:10 AM BY PHONE Have you seen your provider in the last 6 months (3 months if uncontrolled diabetes)? Yes  Clinician Recommendations:  Aim for 30 minutes of exercise or brisk walking, 6-8 glasses of water, and 5 servings of fruits and vegetables each day. TAKE CARE!      This is a list of the screening recommended for you and due dates:  Health Maintenance  Topic Date Due   Zoster (Shingles) Vaccine (1 of 2) 06/16/1968   DTaP/Tdap/Td vaccine (2 - Td or Tdap) 06/19/2018   COVID-19 Vaccine (2 - Pfizer risk series) 05/22/2020   Flu Shot  03/07/2024   Medicare Annual Wellness Visit  02/11/2025   Colon Cancer Screening  06/02/2025   Pneumococcal Vaccine for age over 58  Completed   Hepatitis C Screening  Completed   Hepatitis B Vaccine  Aged Out   HPV Vaccine  Aged Out   Meningitis B Vaccine  Aged Out    Advanced directives: (ACP Link)Information on Advanced Care Planning can be found at [place]  Best boy Advance Health Care Directives Advance Health Care Directives. [email or web address]  Advance Care Planning is important because it:  [x]  Makes sure you receive the medical care that is consistent with your values, goals, and preferences  [x]  It provides guidance to your family and loved ones and reduces their decisional burden about whether or not they are making the right decisions based on your wishes.  Follow the link provided in your after visit summary or read over the paperwork we have mailed to you to help you started getting  your Advance Directives in place. If you need assistance in completing these, please reach out to us  so that we can help you!

## 2024-04-15 ENCOUNTER — Ambulatory Visit: Admitting: Physician Assistant

## 2024-04-15 VITALS — BP 131/68 | HR 93 | Ht 66.0 in | Wt 196.0 lb

## 2024-04-15 DIAGNOSIS — R81 Glycosuria: Secondary | ICD-10-CM

## 2024-04-15 DIAGNOSIS — R300 Dysuria: Secondary | ICD-10-CM | POA: Diagnosis not present

## 2024-04-15 DIAGNOSIS — N481 Balanitis: Secondary | ICD-10-CM

## 2024-04-15 DIAGNOSIS — N401 Enlarged prostate with lower urinary tract symptoms: Secondary | ICD-10-CM

## 2024-04-15 LAB — URINALYSIS, COMPLETE
Bilirubin, UA: NEGATIVE
Nitrite, UA: POSITIVE — AB
Specific Gravity, UA: 1.025 (ref 1.005–1.030)
Urobilinogen, Ur: 1 mg/dL (ref 0.2–1.0)
pH, UA: 6 (ref 5.0–7.5)

## 2024-04-15 LAB — MICROSCOPIC EXAMINATION: WBC, UA: >30 /HPF — AB (ref 0–5)

## 2024-04-15 LAB — BLADDER SCAN AMB NON-IMAGING: Scan Result: 14

## 2024-04-15 MED ORDER — CLOTRIMAZOLE 1 % EX CREA: 1.0000 | TOPICAL_CREAM | Freq: Two times a day (BID) | CUTANEOUS | 0 refills | Status: AC

## 2024-04-15 MED ORDER — AMOXICILLIN 875 MG PO TABS: 875.0000 mg | ORAL_TABLET | Freq: Two times a day (BID) | ORAL | 0 refills | Status: AC

## 2024-04-15 NOTE — Progress Notes (Signed)
 04/15/2024 2:23 PM   [name] 05/12/1951 [id number]  CC: Chief Complaint  Patient presents with   Follow-up   HPI: [name] is a 73 y.o. male with PMH BPH with LUTS, prediabetes, and nephrolithiasis who presents today for evaluation of possible UTI.  He has a come in today by his wife, who contributes to HPI.  Today he reports a 3 to 4-day history of urgency, urinary leakage, weakness, and scalding pain of the glans penis.  He has had chills, but denies fever, nausea, vomiting, or flank pain.  His wife gave him a dose of p.o. Diflucan 2 days ago for his penile discomfort.  Notably, he had an A1c in January that was elevated at 7.3.  This was felt to be falsely elevated in the setting of recent steroid use.  He is not on any antiglycemics.  In-office UA today positive for 2+ glucose, 1+ protein, 1+ blood, nitrites, trace ketones, and 1+ leukocytes; urine microscopy with >30 WBCs/HPF, 3-10 RBCs/HPF, and many bacteria. PVR 14mL.  PMH: Past Medical History:  Diagnosis Date   CAD (coronary artery disease)    a. CT scan 01/02/2015: extensive coronary atherosclerosis;  b. 04/2015 Inf STEMI/Cath/CABG x 4: LIMA->LAD, VG->Diag, VG->OM2, VG->RPL; c. 09/2015 MV: EF 55-65%, infsept/inf infarct w/o ischemia->Low Risk.   Colon polyps    Complete heart block (HCC)    a. 04/2015 in setting of inferior STEMI-->resolved.   Difficult intubation    History of kidney stones    Hyperlipidemia    Hypertension    Ischemic cardiomyopathy    a. 04/2015 LV gram: EF 35-40%; b. 09/2015 MV: EF 55-65%.   MI (myocardial infarction) (HCC) 04/2015   Postoperative atrial fibrillation (HCC)    a. 04/2015 Post-op CABG AF/AFlutter--> converted with rapid atrial pacing and amio; b. CHA2DS2VASc = 3->Coumadin .   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   Unilateral inguinal hernia without obstruction or gangrene 04/29/2015   Ureterolithiasis 01/12/2015    Surgical History: Past Surgical History:  Procedure Laterality Date    CARDIAC CATHETERIZATION N/A 05/03/2015   Procedure: Left Heart Cath and Coronary Angiography;  Surgeon: Deatrice DELENA Cage, MD;  Location: [facility] INVASIVE CV LAB;  Service: Cardiovascular;  Laterality: N/A;   CATARACT EXTRACTION, BILATERAL     [facility]   COLONOSCOPY WITH PROPOFOL  N/A 06/02/2022   Procedure: COLONOSCOPY WITH PROPOFOL ;  Surgeon: Maryruth Ole DASEN, MD;  Location: [facility] ENDOSCOPY;  Service: Endoscopy;  Laterality: N/A;   CORONARY ARTERY BYPASS GRAFT N/A 05/03/2015   Procedure: CORONARY ARTERY BYPASS GRAFTING (CABG) times four using left internal mammary artery and right saphenous vein.;  Surgeon: Elspeth JAYSON Millers, MD;  Location: [facility] OR;  Service: Open Heart Surgery;  Laterality: N/A;   CYSTOSCOPY WITH STENT PLACEMENT Left 09/16/2019   Procedure: CYSTOSCOPY WITH STENT PLACEMENT;  Surgeon: Twylla Glendia JAYSON, MD;  Location: [facility] ORS;  Service: Urology;  Laterality: Left;   CYSTOSCOPY/RETROGRADE/URETEROSCOPY Left 09/16/2019   Procedure: CYSTOSCOPY/RETROGRADE/URETEROSCOPY;  Surgeon: Twylla Glendia JAYSON, MD;  Location: [facility] ORS;  Service: Urology;  Laterality: Left;   CYSTOSCOPY/URETEROSCOPY/HOLMIUM LASER/STENT PLACEMENT Left 10/07/2019   Procedure: CYSTOSCOPY/URETEROSCOPY/HOLMIUM LASER/STENT Exhange;  Surgeon: Twylla Glendia JAYSON, MD;  Location: [facility] ORS;  Service: Urology;  Laterality: Left;   EYE SURGERY     HERNIA REPAIR     ventral hernia repair   JOINT REPLACEMENT     KNEE SURGERY Left    arthroscopic knee surgery   NECK SURGERY     Spinal fusion- neck. Dr. Alm  [facility] [place] ([facility])   right hip replacement Right 02/04/2014   Dr. Cleotilde   TEE WITHOUT CARDIOVERSION N/A 05/03/2015   Procedure: TRANSESOPHAGEAL ECHOCARDIOGRAM (TEE);  Surgeon: Elspeth JAYSON Millers, MD;  Location: [facility] OR;  Service: Open Heart Surgery;  Laterality: N/A;    Home Medications:  Allergies as of 04/15/2024       Reactions   Lovastatin Other (See Comments)   Chest pain    Pravastatin Other (See Comments)   Nose bleed        Medication List        Accurate as of April 15, 2024  2:23 PM. If you have any questions, ask your nurse or doctor.          aspirin  EC 81 MG tablet Take 1 tablet (81 mg total) by mouth daily.   atorvastatin  80 MG tablet Commonly known as: LIPITOR  Take 1 tablet by mouth once daily   Ensure Plus Liqd Take 237 mLs by mouth daily.   icosapent  Ethyl 1 g capsule Commonly known as: VASCEPA  Take 2 capsules by mouth twice daily   lisinopril  40 MG tablet Commonly known as: ZESTRIL  Take 1 tablet by mouth once daily   metoprolol  succinate 25 MG 24 hr tablet Commonly known as: Toprol  XL Take 1 tablet (25 mg total) by mouth daily.   multivitamin tablet Take 1 tablet by mouth every evening.   nitroGLYCERIN  0.4 MG SL tablet Commonly known as: NITROSTAT  Place 1 tablet (0.4 mg total) under the tongue every 5 (five) minutes as needed for chest pain.   traMADol  50 MG tablet Commonly known as: ULTRAM  Take 50 mg by mouth every 6 (six) hours as needed.        Allergies:  Allergies  Allergen Reactions   Lovastatin Other (See Comments)    Chest pain   Pravastatin Other (See Comments)    Nose bleed    Family History: Family History  Problem Relation Age of Onset   CAD Father    Heart attack Father    Heart Problems Sister        CABG   Heart attack Brother    Heart attack Brother    Heart attack Brother    Heart attack Brother    Heart attack Sister    CAD Maternal Uncle    CAD Paternal Uncle     Social History:   reports that he has never smoked. He has never used smokeless tobacco. He reports that he does not currently use alcohol. He reports that he does not use drugs.  Physical Exam: BP 131/68   Pulse 93   Ht 5' 6 (1.676 m)   Wt 196 lb (88.9 kg)   BMI 31.64 kg/m   Constitutional:  Alert and oriented, no acute distress, nontoxic appearing HEENT: [place], AT Cardiovascular: No clubbing, cyanosis, or  edema Respiratory: Normal respiratory effort, no increased work of breathing GU: Uncircumcised penis.  Foreskin is mobile and retractable.  Diffuse beefy erythema of the glans penis without discharge.  Foreskin appears spared. Skin: No rashes, bruises or suspicious lesions Neurologic: Grossly intact, no focal deficits, moving all 4 extremities Psychiatric: Normal mood and affect  Laboratory Data: Results for orders placed or performed in visit on 04/15/24  Microscopic Examination   Collection Time: 04/15/24  2:03 PM   Urine  Result Value Ref Range   WBC, UA >30 (A) 0 - 5 /hpf   RBC, Urine 3-10 (A) 0 - 2 /hpf   Epithelial Cells (  non renal) 0-10 0 - 10 /hpf   Bacteria, UA Many (A) None seen/Few  Urinalysis, Complete   Collection Time: 04/15/24  2:03 PM  Result Value Ref Range   Specific Gravity, UA 1.025 1.005 - 1.030   pH, UA 6.0 5.0 - 7.5   Color, UA Yellow Yellow   Appearance Ur Cloudy (A) Clear   Leukocytes,UA 1+ (A) Negative   Protein,UA 1+ (A) Negative/Trace   Glucose, UA 2+ (A) Negative   Ketones, UA Trace (A) Negative   RBC, UA 1+ (A) Negative   Bilirubin, UA Negative Negative   Urobilinogen, Ur 1.0 0.2 - 1.0 mg/dL   Nitrite, UA Positive (A) Negative   Microscopic Examination See below:   BLADDER SCAN AMB NON-IMAGING   Collection Time: 04/15/24  2:17 PM  Result Value Ref Range   Scan Result 14 ml    Assessment & Plan:   1. Dysuria (Primary) UA appears grossly infected, though he is emptying appropriately.  Will start empiric amoxicillin  and send for culture for further evaluation.  Will keep a low threshold to extend antibiotics for possible prostate involvement if he does not clinically improve. - Urinalysis, Complete - BLADDER SCAN AMB NON-IMAGING - amoxicillin  (AMOXIL ) 875 MG tablet; Take 1 tablet (875 mg total) by mouth every 12 (twelve) hours for 10 days.  Dispense: 20 tablet; Refill: 0 - CULTURE, URINE COMPREHENSIVE  2. Balanitis He has already taken a dose  of p.o. Diflucan.  Will complete treatment with topical clotrimazole . - clotrimazole  (CLOTRIMAZOLE  ANTI-FUNGAL) 1 % cream; Apply 1 Application topically 2 (two) times daily for 7 days.  Dispense: 28 g; Refill: 0  3. Glucosuria I strongly suspect he has undiagnosed diabetes.  Will repeat A1c today.  We discussed that this would explain #1 and #2 above. - Hemoglobin A1c   Return if symptoms worsen or fail to improve, for Will call with results.  Lucie Hones, PA-C  [facility] [place] [address], [address] [place], [place] [address] [phone]

## 2024-04-16 ENCOUNTER — Ambulatory Visit: Payer: Self-pay | Admitting: Physician Assistant

## 2024-04-16 LAB — HEMOGLOBIN A1C
Est. average glucose Bld gHb Est-mCnc: 183 mg/dL
Hgb A1c MFr Bld: 8 % — ABNORMAL HIGH (ref 4.8–5.6)

## 2024-04-21 ENCOUNTER — Ambulatory Visit: Admitting: Family Medicine

## 2024-04-21 ENCOUNTER — Encounter: Payer: Self-pay | Admitting: Family Medicine

## 2024-04-21 VITALS — BP 106/61 | HR 71 | Resp 16 | Ht 66.0 in | Wt 190.0 lb

## 2024-04-21 DIAGNOSIS — E1159 Type 2 diabetes mellitus with other circulatory complications: Secondary | ICD-10-CM

## 2024-04-21 DIAGNOSIS — I152 Hypertension secondary to endocrine disorders: Secondary | ICD-10-CM

## 2024-04-21 DIAGNOSIS — E119 Type 2 diabetes mellitus without complications: Secondary | ICD-10-CM

## 2024-04-21 DIAGNOSIS — Z7984 Long term (current) use of oral hypoglycemic drugs: Secondary | ICD-10-CM

## 2024-04-21 MED ORDER — METFORMIN HCL ER 500 MG PO TB24: 500.0000 mg | ORAL_TABLET | Freq: Every day | ORAL | 1 refills | Status: AC

## 2024-04-21 NOTE — Patient Instructions (Signed)
 SABRA  Please review the attached list of medications and notify my office if there are any errors.   . Please bring all of your medications to every appointment so we can make sure that our medication list is the same as yours.

## 2024-04-21 NOTE — Progress Notes (Signed)
 Established patient visit   Patient: [name]   DOB: [birth year]   73 y.o. Male  MRN: [id number] Visit Date: 04/21/2024  Today's healthcare provider: Nancyann Perry, MD   Chief Complaint  Patient presents with   Follow-up    F/u A1C No other concernns( No to all vaccines)   Subjective    Discussed the use of AI scribe software for clinical note transcription with the patient, who gave verbal consent to proceed.  History of Present Illness   [name] is a 73 year old male with diabetes who presents for follow-up of his blood sugar management.  His blood sugar levels have been elevated, which he attributes to consuming too many sweets. His hemoglobin A1c was most recently 8.0 when checked at his urologist last week. He has eliminated ice cream, sweets, and sugary drinks from his diet. He was seen by urology for UTI and was thought that diabetes may have been a contributing factor. He recently experienced an infection for which he was prescribed antibiotics, and he reports that the infection has resolved. During this time, he lost five pounds due to a lack of appetite.  He takes a men's one-a-day multivitamin daily. He uses [facility] for his prescriptions.  He has not seen his eye doctor at [facility] this year and plans to make an appointment to ensure diabetes is not affecting his retina.     Lab Results  Component Value Date   HGBA1C 8.0 (H) 04/15/2024   HGBA1C 7.3 (H) 08/17/2023   HGBA1C 6.5 (H) 02/12/2023     Medications: Outpatient Medications Prior to Visit  Medication Sig   amoxicillin  (AMOXIL ) 875 MG tablet Take 1 tablet (875 mg total) by mouth every 12 (twelve) hours for 10 days.   aspirin  81 MG EC tablet Take 1 tablet (81 mg total) by mouth daily.   atorvastatin  (LIPITOR ) 80 MG tablet Take 1 tablet by mouth once daily   clotrimazole  (CLOTRIMAZOLE  ANTI-FUNGAL) 1 % cream Apply 1 Application topically 2 (two) times daily for 7  days.   ENSURE PLUS (ENSURE PLUS) LIQD Take 237 mLs by mouth daily.   icosapent  Ethyl (VASCEPA ) 1 g capsule Take 2 capsules by mouth twice daily   lisinopril  (ZESTRIL ) 40 MG tablet Take 1 tablet by mouth once daily   metoprolol  succinate (TOPROL  XL) 25 MG 24 hr tablet Take 1 tablet (25 mg total) by mouth daily.   Multiple Vitamin (MULTIVITAMIN) tablet Take 1 tablet by mouth every evening.    nitroGLYCERIN  (NITROSTAT ) 0.4 MG SL tablet Place 1 tablet (0.4 mg total) under the tongue every 5 (five) minutes as needed for chest pain.   traMADol  (ULTRAM ) 50 MG tablet Take 50 mg by mouth every 6 (six) hours as needed.   No facility-administered medications prior to visit.   Review of Systems  Constitutional:  Negative for appetite change, chills and fever.  Respiratory:  Negative for chest tightness, shortness of breath and wheezing.   Cardiovascular:  Negative for chest pain and palpitations.  Gastrointestinal:  Negative for abdominal pain, nausea and vomiting.       Objective    BP 106/61 (BP Location: Left Arm, Patient Position: Sitting, Cuff Size: Normal)   Pulse 71   Resp 16   Ht 5' 6 (1.676 m)   Wt 190 lb (86.2 kg)   SpO2 99%   BMI 30.67 kg/m   Physical Exam   General appearance: Mildly obese male,  cooperative and in no acute distress Head: Normocephalic, without obvious abnormality, atraumatic Respiratory: Respirations even and unlabored, normal respiratory rate Extremities: All extremities are intact.  Skin: Skin color, texture, turgor normal. No rashes seen  Psych: Appropriate mood and affect. Neurologic: Mental status: Alert, oriented to person, place, and time, thought content appropriate.    Assessment & Plan        Type 2 diabetes mellitus A1c at 8.0 indicates poorly controlled diabetes. Dietary changes alone insufficient for significant A1c reduction. - Initiated metformin  500 mg extended-release once daily with food. - Considered Ozempic for cardiovascular  benefits and weight loss, noting cost and GI side effects.He prefers generic oral medication at this time.  - Advised continuation of dietary modifications to reduce sugar intake. - Scheduled follow-up in January for physical and diabetes management reassessment. - Recommended eye examination at [facility] for diabetic retinopathy monitoring.          Nancyann Perry, MD  [facility] [phone] (phone) [phone] (fax)  [facility]

## 2024-04-22 LAB — CULTURE, URINE COMPREHENSIVE

## 2024-04-29 ENCOUNTER — Other Ambulatory Visit: Payer: Self-pay | Admitting: Family Medicine

## 2024-05-16 NOTE — Progress Notes (Signed)
 Pharmacy Quality Measure Review  This patient is appearing on a report for being at risk of failing the adherence measure for cholesterol (statin) medications this calendar year.   Medication: atorvastatin  Last fill date: 03/26/24 for 90 day supply  Insurance report was not up to date. No action needed at this time.   Shayann Garbutt E. Marsh, PharmD Clinical Pharmacist [facility] [phone]

## 2024-05-19 ENCOUNTER — Other Ambulatory Visit: Payer: Self-pay

## 2024-05-19 ENCOUNTER — Encounter
Admission: EM | Disposition: A | Discharge status: Home / Self Care | Payer: Self-pay | Source: Home / Self Care | Attending: Internal Medicine

## 2024-05-19 ENCOUNTER — Inpatient Hospital Stay
Admission: EM | Admit: 2024-05-19 | Discharge: 2024-05-23 | DRG: 418 | Disposition: A | Discharge status: Home / Self Care | Attending: Internal Medicine | Admitting: Internal Medicine

## 2024-05-19 ENCOUNTER — Observation Stay: Admitting: Anesthesiology

## 2024-05-19 ENCOUNTER — Emergency Department

## 2024-05-19 DIAGNOSIS — K9181 Other intraoperative complications of digestive system: Secondary | ICD-10-CM | POA: Diagnosis not present

## 2024-05-19 DIAGNOSIS — E1165 Type 2 diabetes mellitus with hyperglycemia: Secondary | ICD-10-CM | POA: Diagnosis present

## 2024-05-19 DIAGNOSIS — I252 Old myocardial infarction: Secondary | ICD-10-CM | POA: Diagnosis not present

## 2024-05-19 DIAGNOSIS — E66811 Obesity, class 1: Secondary | ICD-10-CM | POA: Diagnosis present

## 2024-05-19 DIAGNOSIS — I10 Essential (primary) hypertension: Secondary | ICD-10-CM | POA: Diagnosis present

## 2024-05-19 DIAGNOSIS — K8012 Calculus of gallbladder with acute and chronic cholecystitis without obstruction: Secondary | ICD-10-CM | POA: Diagnosis not present

## 2024-05-19 DIAGNOSIS — Z6831 Body mass index (BMI) 31.0-31.9, adult: Secondary | ICD-10-CM

## 2024-05-19 DIAGNOSIS — K9189 Other postprocedural complications and disorders of digestive system: Secondary | ICD-10-CM | POA: Diagnosis not present

## 2024-05-19 DIAGNOSIS — K838 Other specified diseases of biliary tract: Secondary | ICD-10-CM

## 2024-05-19 DIAGNOSIS — Z951 Presence of aortocoronary bypass graft: Secondary | ICD-10-CM | POA: Diagnosis not present

## 2024-05-19 DIAGNOSIS — I2510 Atherosclerotic heart disease of native coronary artery without angina pectoris: Secondary | ICD-10-CM | POA: Diagnosis not present

## 2024-05-19 DIAGNOSIS — I255 Ischemic cardiomyopathy: Secondary | ICD-10-CM | POA: Diagnosis present

## 2024-05-19 DIAGNOSIS — Z7984 Long term (current) use of oral hypoglycemic drugs: Secondary | ICD-10-CM

## 2024-05-19 DIAGNOSIS — N200 Calculus of kidney: Secondary | ICD-10-CM | POA: Diagnosis not present

## 2024-05-19 DIAGNOSIS — K8020 Calculus of gallbladder without cholecystitis without obstruction: Secondary | ICD-10-CM

## 2024-05-19 DIAGNOSIS — K82A1 Gangrene of gallbladder in cholecystitis: Secondary | ICD-10-CM | POA: Diagnosis present

## 2024-05-19 DIAGNOSIS — K8000 Calculus of gallbladder with acute cholecystitis without obstruction: Secondary | ICD-10-CM | POA: Diagnosis not present

## 2024-05-19 DIAGNOSIS — Y838 Other surgical procedures as the cause of abnormal reaction of the patient, or of later complication, without mention of misadventure at the time of the procedure: Secondary | ICD-10-CM | POA: Diagnosis not present

## 2024-05-19 DIAGNOSIS — K819 Cholecystitis, unspecified: Secondary | ICD-10-CM

## 2024-05-19 DIAGNOSIS — K810 Acute cholecystitis: Secondary | ICD-10-CM | POA: Diagnosis present

## 2024-05-19 DIAGNOSIS — R110 Nausea: Secondary | ICD-10-CM | POA: Diagnosis not present

## 2024-05-19 DIAGNOSIS — R1011 Right upper quadrant pain: Secondary | ICD-10-CM | POA: Diagnosis not present

## 2024-05-19 DIAGNOSIS — I4891 Unspecified atrial fibrillation: Secondary | ICD-10-CM | POA: Diagnosis not present

## 2024-05-19 DIAGNOSIS — E785 Hyperlipidemia, unspecified: Secondary | ICD-10-CM | POA: Diagnosis present

## 2024-05-19 DIAGNOSIS — Z8679 Personal history of other diseases of the circulatory system: Secondary | ICD-10-CM | POA: Diagnosis not present

## 2024-05-19 DIAGNOSIS — Z7982 Long term (current) use of aspirin: Secondary | ICD-10-CM

## 2024-05-19 DIAGNOSIS — K2980 Duodenitis without bleeding: Secondary | ICD-10-CM | POA: Diagnosis present

## 2024-05-19 DIAGNOSIS — R112 Nausea with vomiting, unspecified: Secondary | ICD-10-CM | POA: Diagnosis not present

## 2024-05-19 DIAGNOSIS — Z9049 Acquired absence of other specified parts of digestive tract: Secondary | ICD-10-CM | POA: Diagnosis not present

## 2024-05-19 DIAGNOSIS — E782 Mixed hyperlipidemia: Secondary | ICD-10-CM | POA: Diagnosis not present

## 2024-05-19 DIAGNOSIS — K5730 Diverticulosis of large intestine without perforation or abscess without bleeding: Secondary | ICD-10-CM | POA: Diagnosis not present

## 2024-05-19 DIAGNOSIS — R109 Unspecified abdominal pain: Principal | ICD-10-CM

## 2024-05-19 HISTORY — PX: LAPAROSCOPIC LYSIS OF ADHESIONS: SHX5905

## 2024-05-19 LAB — COMPREHENSIVE METABOLIC PANEL WITH GFR
ALT: 24 U/L (ref 0–44)
AST: 24 U/L (ref 15–41)
Albumin: 3.9 g/dL (ref 3.5–5.0)
Alkaline Phosphatase: 87 U/L (ref 38–126)
Anion gap: 12 (ref 5–15)
BUN: 20 mg/dL (ref 8–23)
CO2: 25 mmol/L (ref 22–32)
Calcium: 8.8 mg/dL — ABNORMAL LOW (ref 8.9–10.3)
Chloride: 99 mmol/L (ref 98–111)
Creatinine, Ser: 1.09 mg/dL (ref 0.61–1.24)
GFR, Estimated: >60 mL/min (ref >60)
Glucose, Bld: 216 mg/dL — ABNORMAL HIGH (ref 70–99)
Potassium: 4.7 mmol/L (ref 3.5–5.1)
Sodium: 136 mmol/L (ref 135–145)
Total Bilirubin: 1.1 mg/dL (ref 0.0–1.2)
Total Protein: 6.9 g/dL (ref 6.5–8.1)

## 2024-05-19 LAB — TROPONIN I (HIGH SENSITIVITY)
Troponin I (High Sensitivity): 13 ng/L (ref <18)
Troponin I (High Sensitivity): 13 ng/L (ref <18)

## 2024-05-19 LAB — URINALYSIS, ROUTINE W REFLEX MICROSCOPIC
Bacteria, UA: NONE SEEN
Bilirubin Urine: NEGATIVE
Glucose, UA: 150 mg/dL — AB
Hgb urine dipstick: NEGATIVE
Ketones, ur: NEGATIVE mg/dL
Nitrite: NEGATIVE
Protein, ur: NEGATIVE mg/dL
Specific Gravity, Urine: 1.026 (ref 1.005–1.030)
pH: 6 (ref 5.0–8.0)

## 2024-05-19 LAB — GLUCOSE, CAPILLARY
Glucose-Capillary: 104 mg/dL — ABNORMAL HIGH (ref 70–99)
Glucose-Capillary: 222 mg/dL — ABNORMAL HIGH (ref 70–99)
Glucose-Capillary: 236 mg/dL — ABNORMAL HIGH (ref 70–99)

## 2024-05-19 LAB — CBC
HCT: 40.4 % (ref 39.0–52.0)
Hemoglobin: 13.1 g/dL (ref 13.0–17.0)
MCH: 26.8 pg (ref 26.0–34.0)
MCHC: 32.4 g/dL (ref 30.0–36.0)
MCV: 82.8 fL (ref 80.0–100.0)
Platelets: 210 K/uL (ref 150–400)
RBC: 4.88 MIL/uL (ref 4.22–5.81)
RDW: 15.1 % (ref 11.5–15.5)
WBC: 17.8 K/uL — ABNORMAL HIGH (ref 4.0–10.5)
nRBC: 0 % (ref 0.0–0.2)

## 2024-05-19 LAB — LIPASE, BLOOD: Lipase: 26 U/L (ref 11–51)

## 2024-05-19 LAB — CBG MONITORING, ED: Glucose-Capillary: 157 mg/dL — ABNORMAL HIGH (ref 70–99)

## 2024-05-19 SURGERY — CHOLECYSTECTOMY, ROBOT-ASSISTED, LAPAROSCOPIC
Anesthesia: General | Site: Abdomen

## 2024-05-19 MED ORDER — SUGAMMADEX SODIUM 200 MG/2ML IV SOLN
INTRAVENOUS | Status: DC | PRN
  Administered 2024-05-19: 349.6 mg via INTRAVENOUS

## 2024-05-19 MED ORDER — MORPHINE SULFATE (PF) 2 MG/ML IV SOLN
2.0000 mg | INTRAVENOUS | Status: DC | PRN
  Administered 2024-05-19 – 2024-05-21 (×5): 2 mg via INTRAVENOUS
  Filled 2024-05-19 (×5): qty 1

## 2024-05-19 MED ORDER — PIPERACILLIN-TAZOBACTAM 3.375 G IVPB
3.3750 g | Freq: Three times a day (TID) | INTRAVENOUS | Status: DC
  Administered 2024-05-19 – 2024-05-23 (×12): 3.375 g via INTRAVENOUS
  Filled 2024-05-19 (×12): qty 50

## 2024-05-19 MED ORDER — OXYCODONE HCL 5 MG PO TABS
ORAL_TABLET | ORAL | Status: AC
  Filled 2024-05-19: qty 1

## 2024-05-19 MED ORDER — PIPERACILLIN-TAZOBACTAM 3.375 G IVPB 30 MIN
3.3750 g | Freq: Once | INTRAVENOUS | Status: AC
  Administered 2024-05-19: 3.375 g via INTRAVENOUS
  Filled 2024-05-19 (×2): qty 50

## 2024-05-19 MED ORDER — FENTANYL CITRATE (PF) 100 MCG/2ML IJ SOLN
25.0000 ug | INTRAMUSCULAR | Status: DC | PRN
  Administered 2024-05-19 (×2): 50 ug via INTRAVENOUS

## 2024-05-19 MED ORDER — PHENYLEPHRINE 80 MCG/ML (10ML) SYRINGE FOR IV PUSH (FOR BLOOD PRESSURE SUPPORT)
PREFILLED_SYRINGE | INTRAVENOUS | Status: DC | PRN
  Administered 2024-05-19 (×5): 160 ug via INTRAVENOUS

## 2024-05-19 MED ORDER — POLYETHYLENE GLYCOL 3350 17 G PO PACK
17.0000 g | PACK | Freq: Every day | ORAL | Status: DC | PRN
  Filled 2024-05-19: qty 1

## 2024-05-19 MED ORDER — SUCCINYLCHOLINE CHLORIDE 200 MG/10ML IV SOSY
PREFILLED_SYRINGE | INTRAVENOUS | Status: DC | PRN
  Administered 2024-05-19: 100 mg via INTRAVENOUS

## 2024-05-19 MED ORDER — ATORVASTATIN CALCIUM 20 MG PO TABS
80.0000 mg | ORAL_TABLET | Freq: Every day | ORAL | Status: DC
  Filled 2024-05-19: qty 4

## 2024-05-19 MED ORDER — PROPOFOL 10 MG/ML IV BOLUS
INTRAVENOUS | Status: DC | PRN
  Administered 2024-05-19: 120 mg via INTRAVENOUS

## 2024-05-19 MED ORDER — MORPHINE SULFATE (PF) 4 MG/ML IV SOLN
4.0000 mg | Freq: Once | INTRAVENOUS | Status: AC
  Administered 2024-05-19: 4 mg via INTRAVENOUS
  Filled 2024-05-19: qty 1

## 2024-05-19 MED ORDER — SODIUM CHLORIDE 0.9 % IV SOLN: INTRAVENOUS | Status: AC

## 2024-05-19 MED ORDER — INSULIN ASPART 100 UNIT/ML IJ SOLN
0.0000 [IU] | Freq: Every day | INTRAMUSCULAR | Status: DC
  Administered 2024-05-19: 2 [IU] via SUBCUTANEOUS
  Filled 2024-05-19 (×2): qty 1

## 2024-05-19 MED ORDER — LACTATED RINGERS IV SOLN: INTRAVENOUS | Status: DC | PRN

## 2024-05-19 MED ORDER — ROCURONIUM BROMIDE 100 MG/10ML IV SOLN
INTRAVENOUS | Status: DC | PRN
Start: 2024-05-19 — End: 2024-05-19
  Administered 2024-05-19: 20 mg via INTRAVENOUS
  Administered 2024-05-19: 50 mg via INTRAVENOUS

## 2024-05-19 MED ORDER — ROCURONIUM BROMIDE 10 MG/ML (PF) SYRINGE
PREFILLED_SYRINGE | INTRAVENOUS | Status: AC
  Filled 2024-05-19: qty 10

## 2024-05-19 MED ORDER — BUPIVACAINE-EPINEPHRINE (PF) 0.5% -1:200000 IJ SOLN
INTRAMUSCULAR | Status: DC | PRN
  Administered 2024-05-19: 30 mL

## 2024-05-19 MED ORDER — FENTANYL CITRATE (PF) 100 MCG/2ML IJ SOLN
INTRAMUSCULAR | Status: AC
  Filled 2024-05-19: qty 2

## 2024-05-19 MED ORDER — 0.9 % SODIUM CHLORIDE (POUR BTL) OPTIME
TOPICAL | Status: DC | PRN
  Administered 2024-05-19: 500 mL

## 2024-05-19 MED ORDER — PIPERACILLIN-TAZOBACTAM 3.375 G IVPB 30 MIN: 3.3750 g | Freq: Three times a day (TID) | INTRAVENOUS | Status: DC

## 2024-05-19 MED ORDER — FENTANYL CITRATE (PF) 100 MCG/2ML IJ SOLN
INTRAMUSCULAR | Status: DC | PRN
  Administered 2024-05-19 (×2): 50 ug via INTRAVENOUS

## 2024-05-19 MED ORDER — ICOSAPENT ETHYL 1 G PO CAPS
2.0000 g | ORAL_CAPSULE | Freq: Two times a day (BID) | ORAL | Status: DC
  Filled 2024-05-19: qty 2

## 2024-05-19 MED ORDER — SUCCINYLCHOLINE CHLORIDE 200 MG/10ML IV SOSY
PREFILLED_SYRINGE | INTRAVENOUS | Status: AC
  Filled 2024-05-19: qty 10

## 2024-05-19 MED ORDER — LIDOCAINE HCL (PF) 2 % IJ SOLN
INTRAMUSCULAR | Status: AC
  Filled 2024-05-19: qty 5

## 2024-05-19 MED ORDER — CEFAZOLIN SODIUM-DEXTROSE 2-4 GM/100ML-% IV SOLN: 2.0000 g | INTRAVENOUS | Status: DC

## 2024-05-19 MED ORDER — DEXMEDETOMIDINE HCL IN NACL 80 MCG/20ML IV SOLN
INTRAVENOUS | Status: DC | PRN
  Administered 2024-05-19: 8 ug via INTRAVENOUS

## 2024-05-19 MED ORDER — ENOXAPARIN SODIUM 40 MG/0.4ML IJ SOSY
40.0000 mg | PREFILLED_SYRINGE | INTRAMUSCULAR | Status: DC
  Administered 2024-05-20 – 2024-05-23 (×3): 40 mg via SUBCUTANEOUS
  Filled 2024-05-19 (×3): qty 0.4

## 2024-05-19 MED ORDER — OXYCODONE HCL 5 MG PO TABS
5.0000 mg | ORAL_TABLET | ORAL | Status: DC | PRN
  Administered 2024-05-19 – 2024-05-21 (×3): 5 mg via ORAL
  Filled 2024-05-19 (×2): qty 1

## 2024-05-19 MED ORDER — PROPOFOL 10 MG/ML IV BOLUS
INTRAVENOUS | Status: AC
Start: 2024-05-19 — End: 2024-05-19
  Filled 2024-05-19: qty 20

## 2024-05-19 MED ORDER — INDOCYANINE GREEN 25 MG IV SOLR
INTRAVENOUS | Status: AC
  Filled 2024-05-19: qty 10

## 2024-05-19 MED ORDER — ONDANSETRON HCL 4 MG/2ML IJ SOLN
INTRAMUSCULAR | Status: AC
  Filled 2024-05-19: qty 2

## 2024-05-19 MED ORDER — INSULIN ASPART 100 UNIT/ML IJ SOLN
0.0000 [IU] | Freq: Three times a day (TID) | INTRAMUSCULAR | Status: DC
  Administered 2024-05-19: 2 [IU] via SUBCUTANEOUS
  Administered 2024-05-20: 1 [IU] via SUBCUTANEOUS
  Administered 2024-05-20: 2 [IU] via SUBCUTANEOUS
  Administered 2024-05-20: 1 [IU] via SUBCUTANEOUS
  Administered 2024-05-21: 2 [IU] via SUBCUTANEOUS
  Administered 2024-05-21: 3 [IU] via SUBCUTANEOUS
  Administered 2024-05-21 – 2024-05-22 (×3): 2 [IU] via SUBCUTANEOUS
  Administered 2024-05-22 – 2024-05-23 (×2): 1 [IU] via SUBCUTANEOUS
  Filled 2024-05-19: qty 2
  Filled 2024-05-19 (×10): qty 1

## 2024-05-19 MED ORDER — DROPERIDOL 2.5 MG/ML IJ SOLN: 0.6250 mg | Freq: Once | INTRAMUSCULAR | Status: DC | PRN

## 2024-05-19 MED ORDER — BUPIVACAINE-EPINEPHRINE (PF) 0.5% -1:200000 IJ SOLN
INTRAMUSCULAR | Status: AC
Start: 2024-05-19 — End: 2024-05-19
  Filled 2024-05-19: qty 30

## 2024-05-19 MED ORDER — ONDANSETRON HCL 4 MG/2ML IJ SOLN
4.0000 mg | Freq: Once | INTRAMUSCULAR | Status: AC
  Administered 2024-05-19: 4 mg via INTRAVENOUS
  Filled 2024-05-19: qty 2

## 2024-05-19 MED ORDER — IOHEXOL 300 MG/ML  SOLN
100.0000 mL | Freq: Once | INTRAMUSCULAR | Status: AC | PRN
  Administered 2024-05-19: 100 mL via INTRAVENOUS

## 2024-05-19 MED ORDER — INDOCYANINE GREEN 25 MG IV SOLR
1.2500 mg | INTRAVENOUS | Status: AC
  Administered 2024-05-19: 1.25 mg via INTRAVENOUS

## 2024-05-19 MED ORDER — ONDANSETRON HCL 4 MG/2ML IJ SOLN
INTRAMUSCULAR | Status: DC | PRN
  Administered 2024-05-19: 4 mg via INTRAVENOUS

## 2024-05-19 MED ORDER — ASPIRIN 81 MG PO TBEC
81.0000 mg | DELAYED_RELEASE_TABLET | Freq: Every day | ORAL | Status: DC
Start: 2024-05-19 — End: 2024-05-19
  Filled 2024-05-19: qty 1

## 2024-05-19 MED ORDER — ACETAMINOPHEN 325 MG PO TABS: 650.0000 mg | ORAL_TABLET | Freq: Four times a day (QID) | ORAL | Status: DC | PRN

## 2024-05-19 MED ORDER — ACETAMINOPHEN 650 MG RE SUPP: 650.0000 mg | Freq: Four times a day (QID) | RECTAL | Status: DC | PRN

## 2024-05-19 SURGICAL SUPPLY — 63 items
BAG PRESSURE INF REUSE 1000 (BAG) IMPLANT
CANNULA REDUCER 12-8 DVNC XI (CANNULA) ×2 IMPLANT
CAUTERY HOOK MNPLR 1.6 DVNC XI (INSTRUMENTS) ×2 IMPLANT
CLIP LIGATING HEMO O LOK GREEN (MISCELLANEOUS) ×2 IMPLANT
COVER TIP SHEARS 8 DVNC (MISCELLANEOUS) ×2 IMPLANT
DEFOGGER SCOPE WARM SEASHARP (MISCELLANEOUS) ×2 IMPLANT
DERMABOND ADVANCED .7 DNX12 (GAUZE/BANDAGES/DRESSINGS) ×2 IMPLANT
DRAIN CHANNEL JP 15F RND 3/16 (MISCELLANEOUS) ×1 IMPLANT
DRAPE ARM DVNC X/XI (DISPOSABLE) ×8 IMPLANT
DRAPE COLUMN DVNC XI (DISPOSABLE) ×2 IMPLANT
DRAPE ROBOT W/ LEGGING 30X125 (DRAPES) ×1 IMPLANT
DRAPE SHEET LG 3/4 BI-LAMINATE (DRAPES) ×1 IMPLANT
DRSG TEGADERM 4X4.75 (GAUZE/BANDAGES/DRESSINGS) ×1 IMPLANT
ELECTRODE REM PT RTRN 9FT ADLT (ELECTROSURGICAL) ×2 IMPLANT
EVACUATOR SILICONE 100CC (DRAIN) ×1 IMPLANT
FORCEPS BPLR 8 MD DVNC XI (FORCEP) IMPLANT
FORCEPS BPLR FENES DVNC XI (FORCEP) ×2 IMPLANT
FORCEPS BPLR R/ABLATION 8 DVNC (INSTRUMENTS) ×2 IMPLANT
FORCEPS PROGRASP DVNC XI (FORCEP) ×2 IMPLANT
GLOVE BIOGEL PI IND STRL 6.5 (GLOVE) ×4 IMPLANT
GLOVE BIOGEL PI IND STRL 7.5 (GLOVE) ×4 IMPLANT
GLOVE SURG SYN 6.5 PF PI (GLOVE) ×4 IMPLANT
GLOVE SURG SYN 7.0 PF PI (GLOVE) ×4 IMPLANT
GOWN STRL REUS W/ TWL LRG LVL3 (GOWN DISPOSABLE) ×6 IMPLANT
GRASPER SUT TROCAR 14GX15 (MISCELLANEOUS) ×2 IMPLANT
GRASPER TIP-UP FEN DVNC XI (INSTRUMENTS) ×2 IMPLANT
IRRIGATOR SUCT 8 DISP DVNC XI (IRRIGATION / IRRIGATOR) ×1 IMPLANT
IV 0.9% NACL 1000 ML (IV SOLUTION) IMPLANT
KIT PINK PAD W/HEAD ARM REST (MISCELLANEOUS) ×2 IMPLANT
LABEL OR SOLS (LABEL) ×2 IMPLANT
MANIFOLD NEPTUNE II (INSTRUMENTS) ×2 IMPLANT
NDL DRIVE SUT CUT DVNC (INSTRUMENTS) ×1 IMPLANT
NDL HYPO 22X1.5 SAFETY MO (MISCELLANEOUS) ×1 IMPLANT
NDL INSUFFLATION 14GA 120MM (NEEDLE) ×1 IMPLANT
NEEDLE DRIVE SUT CUT DVNC (INSTRUMENTS) ×2 IMPLANT
NEEDLE HYPO 22X1.5 SAFETY MO (MISCELLANEOUS) ×2 IMPLANT
NEEDLE INSUFFLATION 14GA 120MM (NEEDLE) ×2 IMPLANT
NS IRRIG 500ML POUR BTL (IV SOLUTION) ×2 IMPLANT
OBTURATOR OPTICALSTD 8 DVNC (TROCAR) ×2 IMPLANT
PACK LAP CHOLECYSTECTOMY (MISCELLANEOUS) ×2 IMPLANT
RELOAD STAPLE 45 2.5 WHT DVNC (STAPLE) IMPLANT
RELOAD STAPLE 45 3.5 BLU DVNC (STAPLE) IMPLANT
SCISSORS MNPLR CVD DVNC XI (INSTRUMENTS) ×2 IMPLANT
SEAL UNIV 5-12 XI (MISCELLANEOUS) ×8 IMPLANT
SEALER VESSEL EXT DVNC XI (MISCELLANEOUS) ×1 IMPLANT
SET TUBE SMOKE EVAC HIGH FLOW (TUBING) ×2 IMPLANT
SOLUTION ELECTROSURG ANTI STCK (MISCELLANEOUS) ×2 IMPLANT
SPIKE FLUID TRANSFER (MISCELLANEOUS) ×4 IMPLANT
SPONGE DRAIN TRACH 4X4 STRL 2S (GAUZE/BANDAGES/DRESSINGS) ×1 IMPLANT
SPONGE T-LAP 4X18 ~~LOC~~+RFID (SPONGE) ×2 IMPLANT
STAPLER 45 SUREFORM DVNC (STAPLE) IMPLANT
STAPLER SKIN PROX 35W (STAPLE) ×1 IMPLANT
SUT MNCRL AB 4-0 PS2 18 (SUTURE) ×2 IMPLANT
SUT STRATA 3-0 23 RB-1.5 (SUTURE) ×2 IMPLANT
SUT VIC AB 2-0 SH 27XBRD (SUTURE) ×3 IMPLANT
SUT VICRYL 0 UR6 27IN ABS (SUTURE) ×2 IMPLANT
SUTURE EHLN 3-0 FS-10 30 BLK (SUTURE) ×1 IMPLANT
SUTURE MNCRL 4-0 27XMF (SUTURE) ×2 IMPLANT
SYR 30ML LL (SYRINGE) ×2 IMPLANT
SYSTEM BAG RETRIEVAL 10MM (BASKET) ×2 IMPLANT
TRAP FLUID SMOKE EVACUATOR (MISCELLANEOUS) ×2 IMPLANT
TRAY FOLEY MTR SLVR 16FR STAT (SET/KITS/TRAYS/PACK) IMPLANT
WATER STERILE IRR 500ML POUR (IV SOLUTION) ×2 IMPLANT

## 2024-05-19 NOTE — Anesthesia Procedure Notes (Signed)
 Procedure Name: Intubation Date/Time: 05/19/2024 4:58 PM  Performed by: Ledora Duncan, CRNAPre-anesthesia Checklist: Patient identified, Emergency Drugs available, Suction available and Patient being monitored Patient Re-evaluated:Patient Re-evaluated prior to induction Oxygen Delivery Method: Circle system utilized Preoxygenation: Pre-oxygenation with 100% oxygen Induction Type: IV induction Ventilation: Mask ventilation without difficulty Laryngoscope Size: McGrath and 3 Grade View: Grade I Tube type: Oral Number of attempts: 1 Airway Equipment and Method: Stylet Placement Confirmation: ETT inserted through vocal cords under direct vision, positive ETCO2 and breath sounds checked- equal and bilateral Secured at: 21 cm Tube secured with: Tape Dental Injury: Teeth and Oropharynx as per pre-operative assessment

## 2024-05-19 NOTE — ED Provider Notes (Signed)
 [facility] Provider Note    Event Date/Time   First MD Initiated Contact with Patient 05/19/24 0406     (approximate)   History   Abdominal Pain   HPI  [name] is a 73 y.o. male who presents to the ED for evaluation of Abdominal Pain   Patient presents to the ED for evaluation of 12 hours of right sided abdominal pain and nausea.  No emesis, stool or urinary changes.  Remote history of ventral hernia repair with mesh without other reported intra-abdominal surgical history   Physical Exam   Triage Vital Signs: ED Triage Vitals  Encounter Vitals Group     BP 05/19/24 0333 (!) 164/76     Girls Systolic BP Percentile --      Girls Diastolic BP Percentile --      Boys Systolic BP Percentile --      Boys Diastolic BP Percentile --      Pulse Rate 05/19/24 0333 64     Resp 05/19/24 0333 15     Temp 05/19/24 0333 97.8 F (36.6 C)     Temp Source 05/19/24 0333 Oral     SpO2 05/19/24 0333 98 %     Weight 05/19/24 0334 190 lb (86.2 kg)     Height 05/19/24 0334 5' 6 (1.676 m)     Head Circumference --      Peak Flow --      Pain Score 05/19/24 0333 10     Pain Loc --      Pain Education --      Exclude from Growth Chart --     Most recent vital signs: Vitals:   05/19/24 0333  BP: (!) 164/76  Pulse: 64  Resp: 15  Temp: 97.8 F (36.6 C)  SpO2: 98%    General: Awake, no distress.  Well-appearing CV:  Good peripheral perfusion.  Resp:  Normal effort.  Abd:  No distention.  Right sided mid abdominal tenderness without peritoneal features or guarding. MSK:  No deformity noted.  Neuro:  No focal deficits appreciated. Other:     ED Results / Procedures / Treatments   Labs (all labs ordered are listed, but only abnormal results are displayed) Labs Reviewed  COMPREHENSIVE METABOLIC PANEL WITH GFR - Abnormal; Notable for the following components:      Result Value   Glucose, Bld 216 (*)    Calcium  8.8 (*)    All other components  within normal limits  CBC - Abnormal; Notable for the following components:   WBC 17.8 (*)    All other components within normal limits  URINALYSIS, ROUTINE W REFLEX MICROSCOPIC - Abnormal; Notable for the following components:   Color, Urine YELLOW (*)    APPearance HAZY (*)    Glucose, UA 150 (*)    Leukocytes,Ua TRACE (*)    All other components within normal limits  URINE CULTURE  LIPASE, BLOOD    EKG   RADIOLOGY CT abdomen/pelvis interpreted by me with gallstones and questionable cholecystitis RUQ ultrasound interpreted by me with large gallstone in the gallbladder neck without clear signs of cholecystitis otherwise  Official radiology report(s): CT ABDOMEN PELVIS W CONTRAST Result Date: 05/19/2024 CLINICAL DATA:  Right-sided abdominal pain leukocytosis. EXAM: CT ABDOMEN AND PELVIS WITH CONTRAST TECHNIQUE: Multidetector CT imaging of the abdomen and pelvis was performed using the standard protocol following bolus administration of intravenous contrast. RADIATION DOSE REDUCTION: This exam was performed according to the departmental dose-optimization program which  includes automated exposure control, adjustment of the mA and/or kV according to patient size and/or use of iterative reconstruction technique. CONTRAST:  OMNIPAQUE  IOHEXOL  300 MG/ML  SOLN COMPARISON:  01/02/2015 FINDINGS: Lower chest: No acute findings. Hepatobiliary: A tiny hypodensity in the liver parenchyma is too small to characterize but is statistically most likely benign. No followup imaging is recommended. Trace intrahepatic biliary duct prominence. Gallstones and sludge noted in the gallbladder. Noncalcified stones measure up to 2.7 cm diameter. Subtle ill definition of the gallbladder wall raises the question of edema. No extrahepatic biliary dilation. Pancreas: No focal mass lesion. No dilatation of the main duct. No intraparenchymal cyst. No peripancreatic edema. Spleen: No splenomegaly. No suspicious focal  mass lesion. Adrenals/Urinary Tract: No adrenal nodule or mass. 5 mm nonobstructing interpolar right renal stone. 4.7 cm simple cyst lower pole right kidney increased from 2.8 cm on the study from 9 years ago. Tiny well-defined homogeneous low-density lesions in the right kidney are too small to characterize but statistically most likely benign and probably cysts. No followup imaging is recommended. Left kidney unremarkable. No evidence for hydroureter. Bladder is decompressed but largely obscured by beam hardening artifact from right hip replacement. Stomach/Bowel: Stomach is unremarkable. No gastric wall thickening. No evidence of outlet obstruction. Duodenum is normally positioned as is the ligament of Treitz. No small bowel wall thickening. No small bowel dilatation. The terminal ileum is normal. The base of the appendix is unremarkable. Mid and tip of the appendix is mildly distended and fluid-filled measuring up to 9 mm diameter. No periappendiceal edema inflammation. Minimal diverticular disease noted left colon without diverticulitis. Vascular/Lymphatic: There is moderate atherosclerotic calcification of the abdominal aorta without aneurysm. There is no gastrohepatic or hepatoduodenal ligament lymphadenopathy. No retroperitoneal or mesenteric lymphadenopathy. No pelvic sidewall lymphadenopathy. Reproductive: Prostate gland is obscured by beam hardening artifact. Other: No intraperitoneal free fluid. Musculoskeletal: Status post right hip replacement. No worrisome lytic or sclerotic osseous abnormality. IMPRESSION: 1. Cholelithiasis with subtle ill definition of the gallbladder wall raising the question of edema. Correlation for signs/symptoms of cholecystitis recommended. 2. Mid and tip of the appendix is mildly distended and fluid-filled measuring up to 9 mm diameter. No periappendiceal edema or inflammation. While tip appendicitis is not considered highly likely, mucocele is not excluded. Correlate  clinically and follow-up CT in 3 months recommended to exclude progression. 3. 5 mm nonobstructing interpolar right renal stone. 4. Minimal left colonic diverticulosis without diverticulitis. 5.  Aortic Atherosclerosis (ICD10-I70.0). Electronically Signed   By: Camellia Candle M.D.   On: 05/19/2024 05:40    PROCEDURES and INTERVENTIONS:  Procedures  Medications  piperacillin-tazobactam (ZOSYN) IVPB 3.375 g (3.375 g Intravenous New Bag/Given 05/19/24 9388)  morphine  (PF) 4 MG/ML injection 4 mg (4 mg Intravenous Given 05/19/24 0428)  ondansetron  (ZOFRAN ) injection 4 mg (4 mg Intravenous Given 05/19/24 0427)  iohexol  (OMNIPAQUE ) 300 MG/ML solution 100 mL (100 mLs Intravenous Contrast Given 05/19/24 0458)     IMPRESSION / MDM / ASSESSMENT AND PLAN / ED COURSE  I reviewed the triage vital signs and the nursing notes.  Differential diagnosis includes, but is not limited to, biliary colic, appendicitis, SBO, gastroenteritis  {Patient presents with symptoms of an acute illness or injury that is potentially life-threatening.  Patient presents with about 12 hours of right sided abdominal pain and nausea.  Localized tenderness to mid abdomen on the right side.  Not clearly at McBurney's point.  White count of 18 is noted, normal LFTs.  Urine without infectious features.  CT questions both cholecystitis and possible tip appendicitis.  Follow-up ultrasound performed with large gallstone.  Awaiting radiology read.  As below I consult with surgery who suspects likely cholecystitis as etiology of patient's symptoms.  He will see the patient in consultation and request medical admission.  Clinical Course as of 05/19/24 0630  Mon May 19, 2024  9448 Reassessed and discussed CT results, my concern for his gallbladder, pending ultrasound and possible need for cholecystectomy [DS]  0609 I consult with Dr. Marinda, he thinks it would likely be gallbladder if he has a large stone in the neck. He will look at  imaging and see patient. He asks for medical admit [DS]  0630 I update patient of this , he's agreeable [DS]    Clinical Course User Index [DS] Claudene Rover, MD     FINAL CLINICAL IMPRESSION(S) / ED DIAGNOSES   Final diagnoses:  Abdominal pain, unspecified abdominal location  Nausea  Cholecystitis     Rx / DC Orders   ED Discharge Orders     None        Note:  This document was prepared using Dragon voice recognition software and may include unintentional dictation errors.   Claudene Rover, MD 05/19/24 [phone]

## 2024-05-19 NOTE — ED Triage Notes (Signed)
 Patient brought in by POV with c/o abdomen pain that start in the middle and radiates to his right back. Patient stated symptoms started at 4pm. Denies any N/V/D. Patient has a history of kidney stones. Appendix, gallbladder intact

## 2024-05-19 NOTE — ED Notes (Signed)
 Report given to [facility]

## 2024-05-19 NOTE — Anesthesia Preprocedure Evaluation (Signed)
 Anesthesia Evaluation  Patient identified by MRN, date of birth, ID band Patient awake    Reviewed: Allergy & Precautions, NPO status , Patient's Chart, lab work & pertinent test results  History of Anesthesia Complications (+) DIFFICULT AIRWAY and history of anesthetic complications  Airway Mallampati: III  TM Distance: <3 FB Neck ROM: full    Dental  (+) Chipped, Poor Dentition, Missing, Dental Advidsory Given   Pulmonary neg pulmonary ROS, neg shortness of breath   Pulmonary exam normal        Cardiovascular Exercise Tolerance: Good hypertension, (-) angina + CAD, + Past MI and + CABG  (-) Cardiac Stents + dysrhythmias Atrial Fibrillation (-) Valvular Problems/Murmurs     Neuro/Psych neg Seizures  Neuromuscular disease  negative psych ROS   GI/Hepatic negative GI ROS, Neg liver ROS,neg GERD  ,,  Endo/Other  diabetes (borderline)    Renal/GU Renal disease (kidney stones)  negative genitourinary   Musculoskeletal   Abdominal   Peds  Hematology negative hematology ROS (+)   Anesthesia Other Findings Past Medical History: No date: CAD (coronary artery disease)     Comment:  a. CT scan 01/02/2015: extensive coronary               atherosclerosis;  b. 04/2015 Inf STEMI/Cath/CABG x 4:               LIMA->LAD, VG->Diag, VG->OM2, VG->RPL; c. 09/2015 MV: EF               55-65%, infsept/inf infarct w/o ischemia->Low Risk. No date: Colon polyps No date: Complete heart block (HCC)     Comment:  a. 04/2015 in setting of inferior STEMI-->resolved. No date: Difficult intubation No date: History of kidney stones No date: Hyperlipidemia No date: Hypertension No date: Ischemic cardiomyopathy     Comment:  a. 04/2015 LV gram: EF 35-40%; b. 09/2015 MV: EF 55-65%. 04/2015: MI (myocardial infarction) (HCC) No date: Postoperative atrial fibrillation (HCC)     Comment:  a. 04/2015 Post-op CABG AF/AFlutter--> converted with                rapid atrial pacing and amio; b. CHA2DS2VASc =               3->Coumadin . 04/2015: S/P CABG x 4 04/29/2015: Unilateral inguinal hernia without obstruction or gangrene 01/12/2015: Ureterolithiasis  Past Surgical History: 05/03/2015: CARDIAC CATHETERIZATION; N/A     Comment:  Procedure: Left Heart Cath and Coronary Angiography;                Surgeon: Deatrice DELENA Cage, MD;  Location: [facility] INVASIVE               CV LAB;  Service: Cardiovascular;  Laterality: N/A; No date: CATARACT EXTRACTION, BILATERAL     Comment:  [facility] 05/03/2015: CORONARY ARTERY BYPASS GRAFT; N/A     Comment:  Procedure: CORONARY ARTERY BYPASS GRAFTING (CABG) times               four using left internal mammary artery and right               saphenous vein.;  Surgeon: Elspeth JAYSON Millers, MD;                Location: [facility] OR;  Service: Open Heart Surgery;                Laterality: N/A; 09/16/2019: CYSTOSCOPY WITH STENT PLACEMENT; Left     Comment:  Procedure: CYSTOSCOPY  WITH STENT PLACEMENT;  Surgeon:               Twylla Glendia BROCKS, MD;  Location: [facility] ORS;  Service:               Urology;  Laterality: Left; 09/16/2019: CYSTOSCOPY/RETROGRADE/URETEROSCOPY; Left     Comment:  Procedure: CYSTOSCOPY/RETROGRADE/URETEROSCOPY;  Surgeon:              Twylla Glendia BROCKS, MD;  Location: [facility] ORS;  Service:               Urology;  Laterality: Left; 10/07/2019: CYSTOSCOPY/URETEROSCOPY/HOLMIUM LASER/STENT PLACEMENT;  Left     Comment:  Procedure: CYSTOSCOPY/URETEROSCOPY/HOLMIUM LASER/STENT               Exhange;  Surgeon: Twylla Glendia BROCKS, MD;  Location: [facility]               ORS;  Service: Urology;  Laterality: Left; No date: EYE SURGERY No date: HERNIA REPAIR     Comment:  ventral hernia repair No date: JOINT REPLACEMENT No date: KNEE SURGERY; Left     Comment:  arthroscopic knee surgery No date: NECK SURGERY     Comment:  Spinal fusion- neck. Dr. Alm Molt of [place]               ([facility]) 02/04/2014: right hip replacement; Right     Comment:  Dr. Cleotilde 05/03/2015: TEE WITHOUT CARDIOVERSION; N/A     Comment:  Procedure: TRANSESOPHAGEAL ECHOCARDIOGRAM (TEE);                Surgeon: Elspeth BROCKS Millers, MD;  Location: [facility] OR;                Service: Open Heart Surgery;  Laterality: N/A;  BMI    Body Mass Index: 31.05 kg/m      Reproductive/Obstetrics negative OB ROS                              Anesthesia Physical Anesthesia Plan  ASA: 3  Anesthesia Plan: General   Post-op Pain Management:    Induction: Intravenous  PONV Risk Score and Plan: Ondansetron , Dexamethasone  and Treatment may vary due to age or medical condition  Airway Management Planned: Oral ETT  Additional Equipment:   Intra-op Plan:   Post-operative Plan: Extubation in OR  Informed Consent: I have reviewed the patients History and Physical, chart, labs and discussed the procedure including the risks, benefits and alternatives for the proposed anesthesia with the patient or authorized representative who has indicated his/her understanding and acceptance.     Dental Advisory Given  Plan Discussed with: Anesthesiologist, CRNA and Surgeon  Anesthesia Plan Comments: (Patient consented for risks of anesthesia including but not limited to:  - adverse reactions to medications - risk of airway placement if required - damage to eyes, teeth, lips or other oral mucosa - nerve damage due to positioning  - sore throat or hoarseness - Damage to heart, brain, nerves, lungs, other parts of body or loss of life  Patient voiced understanding.)         Anesthesia Quick Evaluation

## 2024-05-19 NOTE — Transfer of Care (Signed)
 Immediate Anesthesia Transfer of Care Note  Patient: [name]  Procedure(s) Performed: Procedure(s): CHOLECYSTECTOMY, ROBOT-ASSISTED, LAPAROSCOPIC (N/A) LYSIS, ADHESIONS, LAPAROSCOPIC (N/A)  Patient Location: PACU  Anesthesia Type:General  Level of Consciousness: sedated  Airway & Oxygen Therapy: Patient Spontanous Breathing and Patient connected to face mask oxygen  Post-op Assessment: Report given to RN and Post -op Vital signs reviewed and stable  Post vital signs: Reviewed and stable  Last Vitals:  Vitals:   05/19/24 1610 05/19/24 1910  BP: (!) 165/67 (!) 120/58  Pulse:  97  Resp: 18 15  Temp: 36.7 C 36.6 C  SpO2:  96%    Complications: No apparent anesthesia complications

## 2024-05-19 NOTE — H&P (Signed)
 History and Physical    [name] FMW:[id number] DOB: 1951-04-20 DOA: 05/19/2024  DOS: the patient was seen and examined on 05/19/2024  PCP: [name], MD   Patient coming from: Home  I have personally briefly reviewed patient's old medical records in [facility]  Chief Complaint: Abdominal pain since yesterday  HPI: [name] is a pleasant 73 y.o. male with medical history significant for CAD, HTN, DM on metformin  came into ED complaining of abdominal pain for 12 duration associate with some nausea.  Patient stated that his pain is 7/10 intensity, around right upper quadrant, radiates to right back.  His pain started around 4 PM yesterday evening.  He denies any fever, chills, cough, shortness of breath, chest pain, palpitations.  He denies any dysuria, hematuria.  ED Course: Upon arrival to the ED, patient is found to be hypertensive, leukocytosis at 17.8, glucose 216, calcium  8.8 urine analysis showed trace leukocytes esterase CT abdomen and pelvis showed gallstone and possible cholecystitis.  Right upper quadrant ultrasound shows large gallstones in the gallbladder neck without clear signs of cholecystitis.  Appendix tip was dilated.  Patient was given Zosyn, IV fluid, morphine , ondansetron .  Surgical team was consulted for evaluation for possible appendicitis versus cholecystitis.  Dr. Marinda from surgical team wanted medicine to admit.  Hospitalist service was consulted for evaluation for admission for possible cholecystitis for medical management.  Review of Systems:  ROS  All other systems negative except as noted in the HPI.  Past Medical History:  Diagnosis Date   CAD (coronary artery disease)    a. CT scan 01/02/2015: extensive coronary atherosclerosis;  b. 04/2015 Inf STEMI/Cath/CABG x 4: LIMA->LAD, VG->Diag, VG->OM2, VG->RPL; c. 09/2015 MV: EF 55-65%, infsept/inf infarct w/o ischemia->Low Risk.   Colon polyps    Complete heart block (HCC)    a. 04/2015  in setting of inferior STEMI-->resolved.   Difficult intubation    History of kidney stones    Hyperlipidemia    Hypertension    Ischemic cardiomyopathy    a. 04/2015 LV gram: EF 35-40%; b. 09/2015 MV: EF 55-65%.   MI (myocardial infarction) (HCC) 04/2015   Postoperative atrial fibrillation (HCC)    a. 04/2015 Post-op CABG AF/AFlutter--> converted with rapid atrial pacing and amio; b. CHA2DS2VASc = 3->Coumadin .   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   Unilateral inguinal hernia without obstruction or gangrene 04/29/2015   Ureterolithiasis 01/12/2015    Past Surgical History:  Procedure Laterality Date   CARDIAC CATHETERIZATION N/A 05/03/2015   Procedure: Left Heart Cath and Coronary Angiography;  Surgeon: Deatrice DELENA Cage, MD;  Location: [facility] INVASIVE CV LAB;  Service: Cardiovascular;  Laterality: N/A;   CATARACT EXTRACTION, BILATERAL     [facility]   COLONOSCOPY WITH PROPOFOL  N/A 06/02/2022   Procedure: COLONOSCOPY WITH PROPOFOL ;  Surgeon: Maryruth Ole DASEN, MD;  Location: [facility] ENDOSCOPY;  Service: Endoscopy;  Laterality: N/A;   CORONARY ARTERY BYPASS GRAFT N/A 05/03/2015   Procedure: CORONARY ARTERY BYPASS GRAFTING (CABG) times four using left internal mammary artery and right saphenous vein.;  Surgeon: Elspeth JAYSON Millers, MD;  Location: [facility] OR;  Service: Open Heart Surgery;  Laterality: N/A;   CYSTOSCOPY WITH STENT PLACEMENT Left 09/16/2019   Procedure: CYSTOSCOPY WITH STENT PLACEMENT;  Surgeon: Twylla Glendia JAYSON, MD;  Location: [facility] ORS;  Service: Urology;  Laterality: Left;   CYSTOSCOPY/RETROGRADE/URETEROSCOPY Left 09/16/2019   Procedure: CYSTOSCOPY/RETROGRADE/URETEROSCOPY;  Surgeon: Twylla Glendia JAYSON, MD;  Location: [facility] ORS;  Service: Urology;  Laterality: Left;  CYSTOSCOPY/URETEROSCOPY/HOLMIUM LASER/STENT PLACEMENT Left 10/07/2019   Procedure: CYSTOSCOPY/URETEROSCOPY/HOLMIUM LASER/STENT Exhange;  Surgeon: Twylla Glendia BROCKS, MD;  Location: [facility] ORS;  Service: Urology;  Laterality: Left;    EYE SURGERY     HERNIA REPAIR     ventral hernia repair   JOINT REPLACEMENT     KNEE SURGERY Left    arthroscopic knee surgery   NECK SURGERY     Spinal fusion- neck. Dr. Alm Molt of [place] ([facility])   right hip replacement Right 02/04/2014   Dr. Cleotilde   TEE WITHOUT CARDIOVERSION N/A 05/03/2015   Procedure: TRANSESOPHAGEAL ECHOCARDIOGRAM (TEE);  Surgeon: Elspeth BROCKS Millers, MD;  Location: [facility] OR;  Service: Open Heart Surgery;  Laterality: N/A;     reports that he has never smoked. He has never used smokeless tobacco. He reports that he does not currently use alcohol. He reports that he does not use drugs.  Allergies  Allergen Reactions   Lovastatin Other (See Comments)    Chest pain   Pravastatin Other (See Comments)    Nose bleed    Family History  Problem Relation Age of Onset   CAD Father    Heart attack Father    Heart Problems Sister        CABG   Heart attack Brother    Heart attack Brother    Heart attack Brother    Heart attack Brother    Heart attack Sister    CAD Maternal Uncle    CAD Paternal Uncle     Prior to Admission medications   Medication Sig Start Date End Date Taking? Authorizing Provider  aspirin  81 MG EC tablet Take 1 tablet (81 mg total) by mouth daily. 05/17/15   Vivienne Lonni Ingle, NP  atorvastatin  (LIPITOR ) 80 MG tablet Take 1 tablet by mouth once daily 09/26/23   Darron Deatrice LABOR, MD  ENSURE PLUS (ENSURE PLUS) LIQD Take 237 mLs by mouth daily.    [provider]  icosapent  Ethyl (VASCEPA ) 1 g capsule Take 2 capsules by mouth twice daily 04/29/24   [name], MD  lisinopril  (ZESTRIL ) 40 MG tablet Take 1 tablet by mouth once daily 08/27/23   Arida, Muhammad A, MD  metFORMIN  (GLUCOPHAGE -XR) 500 MG 24 hr tablet Take 1 tablet (500 mg total) by mouth daily with breakfast. 04/21/24   [name], MD  metoprolol  succinate (TOPROL  XL) 25 MG 24 hr tablet Take 1 tablet (25 mg total) by mouth daily.  10/16/23   Kennyth Chew, MD  Multiple Vitamin (MULTIVITAMIN) tablet Take 1 tablet by mouth every evening.     [provider]  nitroGLYCERIN  (NITROSTAT ) 0.4 MG SL tablet Place 1 tablet (0.4 mg total) under the tongue every 5 (five) minutes as needed for chest pain. 08/22/23 04/21/24  Furth, Cadence H, PA-C  traMADol  (ULTRAM ) 50 MG tablet Take 50 mg by mouth every 6 (six) hours as needed. 08/02/23   [provider]    Physical Exam: Vitals:   05/19/24 0615 05/19/24 0700 05/19/24 0730 05/19/24 0849  BP: (!) 165/68 (!) 165/66 (!) 154/64   Pulse: (!) 59 (!) 59 (!) 59 60  Resp: 20 (!) 21 19 19   Temp:   98 F (36.7 C)   TempSrc:      SpO2: 95% 96% 95% 99%  Weight:      Height:        Physical Exam   Constitutional: Alert, awake, calm, comfortable HEENT: Neck supple Respiratory: Clear to auscultation B/L, no wheezing,  no rales.  Cardiovascular: Regular rate and rhythm, no murmurs / rubs / gallops. No extremity edema. 2+ pedal pulses. No carotid bruits.  Abdomen: Soft, RUQ tenderness, Bowel sounds positive.  Musculoskeletal: no clubbing / cyanosis. Good ROM, no contractures. Normal muscle tone.  Skin: no rashes, lesions, ulcers. Neurologic: CN 2-12 grossly intact. Sensation intact, No focal deficit identified Psychiatric: Alert and oriented x 3. Normal mood.    Labs on Admission: I have personally reviewed following labs and imaging studies  CBC: Recent Labs  Lab 05/19/24 0336  WBC 17.8*  HGB 13.1  HCT 40.4  MCV 82.8  PLT 210   Basic Metabolic Panel: Recent Labs  Lab 05/19/24 0336  NA 136  K 4.7  CL 99  CO2 25  GLUCOSE 216*  BUN 20  CREATININE 1.09  CALCIUM  8.8*   GFR: Estimated Creatinine Clearance: 61.2 mL/min (by C-G formula based on SCr of 1.09 mg/dL). Liver Function Tests: Recent Labs  Lab 05/19/24 0336  AST 24  ALT 24  ALKPHOS 87  BILITOT 1.1  PROT 6.9  ALBUMIN  3.9   Recent Labs  Lab 05/19/24 0336  LIPASE 26   No results for  input(s): AMMONIA in the last 168 hours. Coagulation Profile: No results for input(s): INR, PROTIME in the last 168 hours. Cardiac Enzymes: No results for input(s): CKTOTAL, CKMB, CKMBINDEX, TROPONINI, TROPONINIHS in the last 168 hours. BNP (last 3 results) No results for input(s): BNP in the last 8760 hours. HbA1C: No results for input(s): HGBA1C in the last 72 hours. CBG: No results for input(s): GLUCAP in the last 168 hours. Lipid Profile: No results for input(s): CHOL, HDL, LDLCALC, TRIG, CHOLHDL, LDLDIRECT in the last 72 hours. Thyroid  Function Tests: No results for input(s): TSH, T4TOTAL, FREET4, T3FREE, THYROIDAB in the last 72 hours. Anemia Panel: No results for input(s): VITAMINB12, FOLATE, FERRITIN, TIBC, IRON, RETICCTPCT in the last 72 hours. Urine analysis:    Component Value Date/Time   COLORURINE YELLOW (A) 05/19/2024 0336   APPEARANCEUR HAZY (A) 05/19/2024 0336   APPEARANCEUR Cloudy (A) 04/15/2024 1403   LABSPEC 1.026 05/19/2024 0336   LABSPEC 1.024 01/19/2014 0737   PHURINE 6.0 05/19/2024 0336   GLUCOSEU 150 (A) 05/19/2024 0336   GLUCOSEU Negative 01/19/2014 0737   HGBUR NEGATIVE 05/19/2024 0336   BILIRUBINUR NEGATIVE 05/19/2024 0336   BILIRUBINUR Negative 04/15/2024 1403   BILIRUBINUR Negative 01/19/2014 0737   KETONESUR NEGATIVE 05/19/2024 0336   PROTEINUR NEGATIVE 05/19/2024 0336   NITRITE NEGATIVE 05/19/2024 0336   LEUKOCYTESUR TRACE (A) 05/19/2024 0336   LEUKOCYTESUR Negative 01/19/2014 0737    Radiological Exams on Admission: I have personally reviewed images US  ABDOMEN LIMITED RUQ (LIVER/GB) Result Date: 05/19/2024 CLINICAL DATA:  Gallstones. EXAM: ULTRASOUND ABDOMEN LIMITED RIGHT UPPER QUADRANT COMPARISON:  CT scan earlier same day FINDINGS: Gallbladder: Multiple gallstones evident. Gallbladder wall thickness measured at 2.6 mm, upper normal. No pericholecystic fluid. Sonographer reports no  sonographic Murphy sign. Common bile duct: Diameter: 5 mm Liver: No focal liver lesion. Portal vein is patent on color Doppler imaging with normal direction of blood flow towards the liver. Other: None. IMPRESSION: Cholelithiasis without gallbladder wall thickening or pericholecystic fluid. No biliary dilatation. Electronically Signed   By: Camellia Candle M.D.   On: 05/19/2024 07:30   CT ABDOMEN PELVIS W CONTRAST Result Date: 05/19/2024 CLINICAL DATA:  Right-sided abdominal pain leukocytosis. EXAM: CT ABDOMEN AND PELVIS WITH CONTRAST TECHNIQUE: Multidetector CT imaging of the abdomen and pelvis was performed using the standard protocol following bolus  administration of intravenous contrast. RADIATION DOSE REDUCTION: This exam was performed according to the departmental dose-optimization program which includes automated exposure control, adjustment of the mA and/or kV according to patient size and/or use of iterative reconstruction technique. CONTRAST:  OMNIPAQUE  IOHEXOL  300 MG/ML  SOLN COMPARISON:  01/02/2015 FINDINGS: Lower chest: No acute findings. Hepatobiliary: A tiny hypodensity in the liver parenchyma is too small to characterize but is statistically most likely benign. No followup imaging is recommended. Trace intrahepatic biliary duct prominence. Gallstones and sludge noted in the gallbladder. Noncalcified stones measure up to 2.7 cm diameter. Subtle ill definition of the gallbladder wall raises the question of edema. No extrahepatic biliary dilation. Pancreas: No focal mass lesion. No dilatation of the main duct. No intraparenchymal cyst. No peripancreatic edema. Spleen: No splenomegaly. No suspicious focal mass lesion. Adrenals/Urinary Tract: No adrenal nodule or mass. 5 mm nonobstructing interpolar right renal stone. 4.7 cm simple cyst lower pole right kidney increased from 2.8 cm on the study from 9 years ago. Tiny well-defined homogeneous low-density lesions in the right kidney are too small to  characterize but statistically most likely benign and probably cysts. No followup imaging is recommended. Left kidney unremarkable. No evidence for hydroureter. Bladder is decompressed but largely obscured by beam hardening artifact from right hip replacement. Stomach/Bowel: Stomach is unremarkable. No gastric wall thickening. No evidence of outlet obstruction. Duodenum is normally positioned as is the ligament of Treitz. No small bowel wall thickening. No small bowel dilatation. The terminal ileum is normal. The base of the appendix is unremarkable. Mid and tip of the appendix is mildly distended and fluid-filled measuring up to 9 mm diameter. No periappendiceal edema inflammation. Minimal diverticular disease noted left colon without diverticulitis. Vascular/Lymphatic: There is moderate atherosclerotic calcification of the abdominal aorta without aneurysm. There is no gastrohepatic or hepatoduodenal ligament lymphadenopathy. No retroperitoneal or mesenteric lymphadenopathy. No pelvic sidewall lymphadenopathy. Reproductive: Prostate gland is obscured by beam hardening artifact. Other: No intraperitoneal free fluid. Musculoskeletal: Status post right hip replacement. No worrisome lytic or sclerotic osseous abnormality. IMPRESSION: 1. Cholelithiasis with subtle ill definition of the gallbladder wall raising the question of edema. Correlation for signs/symptoms of cholecystitis recommended. 2. Mid and tip of the appendix is mildly distended and fluid-filled measuring up to 9 mm diameter. No periappendiceal edema or inflammation. While tip appendicitis is not considered highly likely, mucocele is not excluded. Correlate clinically and follow-up CT in 3 months recommended to exclude progression. 3. 5 mm nonobstructing interpolar right renal stone. 4. Minimal left colonic diverticulosis without diverticulitis. 5.  Aortic Atherosclerosis (ICD10-I70.0). Electronically Signed   By: Camellia Candle M.D.   On: 05/19/2024 05:40     EKG: N/A    Assessment/Plan Principal Problem:   Acute cholecystitis Active Problems:   History of acute inferior wall MI   S/P CABG x 4   History of complete heart block   HLD (hyperlipidemia)   HTN (hypertension)    Assessment and Plan: 73 year old male with history of CAD s/p CABG September 2016, ischemic cardiomyopathy with improved ejection fraction, hyperlipidemia, HTN who presented to ED with epigastric and right upper quadrant abdominal pain.  1.  Acute cholecystitis/questionable appendicitis - He will be placed in observation - He will be n.p.o., IV fluid, pain medications.  Patient received Zosyn in the emergency room which will be continued as well. - Surgical service was consulted from ED and they are planning on taking to the OR. - Will follow the recommendation from surgical team.  2.  History of coronary artery disease s/p CABG - Patient complains of epigastric pain no EKG or troponin was done in the emergency room. - Will get EKG and troponin and make sure he does not have acute coronary syndrome going on at this point. - He takes aspirin  and statin which will be held in anticipation for surgery  3.  HLD - Hold statin today  4.  HTN - Stable - Hold oral medications - Will place him on hydralazine as needed for systolic blood pressure more than 160  5.  DM type II - Patient takes metformin  and Vascepa  at home - Oral hypoglycemic medications will be held while he is in the hospital - Placed on insulin  sliding scale     DVT prophylaxis: Lovenox  Code Status: Full Code Family Communication: None Disposition Plan: Home Consults called: General Surgery Admission status: Observation, Telemetry bed   Nena Rebel, MD [facility] 05/19/2024, 11:42 AM

## 2024-05-19 NOTE — Op Note (Signed)
 Robotic assisted laparoscopic Sub total Cholecystectomy  Pre-operative Diagnosis: Acute cholecystitis  Post-operative Diagnosis: Acute gangernous cholecystitis  Procedure:  Robotic assisted laparoscopic Sub-total Cholecystectomy  Surgeon: Jayson Endow, MD  Anesthesia: Gen. with endotracheal tube  Findings: Necrotic gallbladder.  When retraction of the infundibulum the gallbladder fell apart.  Was unable to obtain critical view of safety so elected to perform a subtotal cholecystectomy.  I burned off the anterior wall of the gallbladder and burned the posterior wall of the gallbladder.  A pursestring suture was placed where the c infundibulum tapered down into the cystic duct.  Estimated Blood Loss: 20cc       Specimens: Gallbladder           Complications: none   Procedure Details  The patient was seen again in the Holding Room. The benefits, complications, treatment options, and expected outcomes were discussed with the patient. The risks of bleeding, infection, recurrence of symptoms, failure to resolve symptoms, bile duct damage, bile duct leak, retained common bile duct stone, bowel injury, any of which could require further surgery and/or ERCP, stent, or papillotomy were reviewed with the patient. The likelihood of improving the patient's symptoms with return to their baseline status is good.  The patient and/or family concurred with the proposed plan, giving informed consent.  The patient was taken to Operating Room, identified  and the procedure verified as robotic Cholecystectomy.  A Time Out was held and the above information confirmed.  Prior to the induction of general anesthesia, antibiotic prophylaxis was administered. VTE prophylaxis was in place. General endotracheal anesthesia was then administered and tolerated well. After the induction, the abdomen was prepped with Chloraprep and draped in the sterile fashion. The patient was positioned in the supine position.  A  veress needle was inserted into the abdomen using standard drop technique. An 8mm infra-umbilical robotic port was then placed under direct visualization. There was no injury noted at the site of veress needle insertion.  There were adhesions to the anterior abdominal wall.  I did place a 5 mm left lower quadrant port under direct visualization.  A 12 mm left hemiabdomen port was also placed under direct visualization.  I then lysed adhesions to the anterior abdominal wall for approximately 30 minutes.  There is no bowel in the adhesions but they were omental adhesions and quite vascular.  Once I was able to free up the anterior abdominal wall to right sided 8 mm trocars were then placed under direct visualization.  There was omentum adhesed to the gallbladder.  This was taken down in a blunt fashion as well as with cautery.  Once I was able to finally see the gallbladder it did appear gray and necrotic.  I retracted the fundus over the dome of the liver however unfortunately just with gentle retraction the fundus completely fell apart.  There was spillage of large stones and bile.  Once I was able to retract some of the fundus over the dome of the liver I was able to identify the infundibulum.  However again once I gently retracted the infundibulum laterally the gallbladder fell apart.  At this time I was concerned about possible bile duct injury so I did not proceed with a cholecystectomy however I did proceed with a subtotal cholecystectomy.  The front wall of the gallbladder that had already been opened was taken off of the gallbladder fossa with a LigaSure device.  This exposed the posterior wall of the gallbladder.  This was burned with cautery.  I was able to identify where the infundibulum started to taper down into the cystic duct.  However the tissue was quite friable.  I did place a pursestring suture in this orifice to try and collapse the space and prevent any bile leak.  I then examined the right lower  quadrant as there was some concern that a CT scan had appendicitis.  There was no evidence of inflammation in his appendix nor was there any evidence of inflammation in the right lower quadrant.  The gallbladder walls as well as the large stones were then placed in an Endo Catch bag.  It was removed via the 12 mm port.  A 19 [place] Blake drain was then placed in the right upper quadrant and placed under the liver.  This was secured to the skin with 3-0 Vicryl.  The abdomen was then allowed to be desufflated.  The fascia of the 12 mm port was then closed with 0 Vicryl in a UR 6 suture.  The skin was then closed with staples.  Prior to termination all sponge instrument counts were correct x 2.  The patient was taken to the PACU in good condition.         Patient will likely need ERCP due to subtotal fenestrating cholecystectomy.      Jayson Endow, M.D. [facility]

## 2024-05-19 NOTE — ED Notes (Signed)
 Paper consent signed by pt and this RN and pt verbalized understanding and stated the surgeon team went over the plans for surgery later today.  Consent placed in box with pt stickers.

## 2024-05-19 NOTE — ED Notes (Signed)
 Message sent to MD regarding pt pain increasing

## 2024-05-19 NOTE — Consult Note (Addendum)
 [facility] SURGICAL CONSULTATION NOTE (initial) - cpt: 99244   HISTORY OF PRESENT ILLNESS (HPI):  73 y.o. [name] presented to [facility] ED this morning secondary to abdominal pain. Patient report acute onset of epigastric and periumbilical abdominal pain over the last 24 hours. He reports history of similar pain over the course of the last 1.5 months. He is unsure if anything triggers this. No fever, chills, nausea, emesis, CP, SOB, urinary changes, bowel changes, nor juandice. He denied any previous known gallstones. He does have history of open ventral hernia repair with mesh many years ago. Also with history of CABG only on 81 mg ASA. He did have Echo in March 2025 with EF >50%. Work up in the ED revealed a leukocytosis to 19.0K (now 17.8K), Hgb to 13.1, sCr - 1.09, bilirubin 1.1. He did have CT Abdomen/Pelvis concerning for cholelithiasis as well as dilation to the tip of his appendix without gross evidence of appendicitis. RUQ US  also concerning for cholelithiasis and question of wall thickening. He was given a dose of Zosyn in the ED.   Surgery is consulted by hospitalist physician Dr. Ester Sharps, MD in this context for evaluation and management of possible cholecystitis vs appendicitis.  PAST MEDICAL HISTORY (PMH):  Past Medical History:  Diagnosis Date   CAD (coronary artery disease)    a. CT scan 01/02/2015: extensive coronary atherosclerosis;  b. 04/2015 Inf STEMI/Cath/CABG x 4: LIMA->LAD, VG->Diag, VG->OM2, VG->RPL; c. 09/2015 MV: EF 55-65%, infsept/inf infarct w/o ischemia->Low Risk.   Colon polyps    Complete heart block (HCC)    a. 04/2015 in setting of inferior STEMI-->resolved.   Difficult intubation    History of kidney stones    Hyperlipidemia    Hypertension    Ischemic cardiomyopathy    a. 04/2015 LV gram: EF 35-40%; b. 09/2015 MV: EF 55-65%.   MI (myocardial infarction) (HCC) 04/2015   Postoperative atrial fibrillation (HCC)    a. 04/2015 Post-op CABG  AF/AFlutter--> converted with rapid atrial pacing and amio; b. CHA2DS2VASc = 3->Coumadin .   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   Unilateral inguinal hernia without obstruction or gangrene 04/29/2015   Ureterolithiasis 01/12/2015     PAST SURGICAL HISTORY (PSH):  Past Surgical History:  Procedure Laterality Date   CARDIAC CATHETERIZATION N/A 05/03/2015   Procedure: Left Heart Cath and Coronary Angiography;  Surgeon: Deatrice DELENA Cage, MD;  Location: [facility] INVASIVE CV LAB;  Service: Cardiovascular;  Laterality: N/A;   CATARACT EXTRACTION, BILATERAL     [facility]   COLONOSCOPY WITH PROPOFOL  N/A 06/02/2022   Procedure: COLONOSCOPY WITH PROPOFOL ;  Surgeon: Maryruth Ole DASEN, MD;  Location: [facility] ENDOSCOPY;  Service: Endoscopy;  Laterality: N/A;   CORONARY ARTERY BYPASS GRAFT N/A 05/03/2015   Procedure: CORONARY ARTERY BYPASS GRAFTING (CABG) times four using left internal mammary artery and right saphenous vein.;  Surgeon: Elspeth JAYSON Millers, MD;  Location: [facility] OR;  Service: Open Heart Surgery;  Laterality: N/A;   CYSTOSCOPY WITH STENT PLACEMENT Left 09/16/2019   Procedure: CYSTOSCOPY WITH STENT PLACEMENT;  Surgeon: Twylla Glendia JAYSON, MD;  Location: [facility] ORS;  Service: Urology;  Laterality: Left;   CYSTOSCOPY/RETROGRADE/URETEROSCOPY Left 09/16/2019   Procedure: CYSTOSCOPY/RETROGRADE/URETEROSCOPY;  Surgeon: Twylla Glendia JAYSON, MD;  Location: [facility] ORS;  Service: Urology;  Laterality: Left;   CYSTOSCOPY/URETEROSCOPY/HOLMIUM LASER/STENT PLACEMENT Left 10/07/2019   Procedure: CYSTOSCOPY/URETEROSCOPY/HOLMIUM LASER/STENT Exhange;  Surgeon: Twylla Glendia JAYSON, MD;  Location: [facility] ORS;  Service: Urology;  Laterality: Left;   EYE SURGERY     HERNIA REPAIR  ventral hernia repair   JOINT REPLACEMENT     KNEE SURGERY Left    arthroscopic knee surgery   NECK SURGERY     Spinal fusion- neck. Dr. Alm Molt of [place] [facility])   right hip replacement Right 02/04/2014   Dr. Cleotilde   TEE  WITHOUT CARDIOVERSION N/A 05/03/2015   Procedure: TRANSESOPHAGEAL ECHOCARDIOGRAM (TEE);  Surgeon: Elspeth JAYSON Millers, MD;  [facility] OR;  Service: Open Heart Surgery;  Laterality: N/A;     MEDICATIONS:  Prior to Admission medications   Medication Sig Start Date End Date Taking? Authorizing Provider  aspirin  81 MG EC tablet Take 1 tablet (81 mg total) by mouth daily. 05/17/15   Vivienne Lonni Ingle, NP  atorvastatin  (LIPITOR ) 80 MG tablet Take 1 tablet by mouth once daily 09/26/23   Darron Deatrice LABOR, MD  ENSURE PLUS (ENSURE PLUS) LIQD Take 237 mLs by mouth daily.    [provider]  icosapent  Ethyl (VASCEPA ) 1 g capsule Take 2 capsules by mouth twice daily 04/29/24   [name], MD  lisinopril  (ZESTRIL ) 40 MG tablet Take 1 tablet by mouth once daily 08/27/23   Arida, Muhammad A, MD  metFORMIN  (GLUCOPHAGE -XR) 500 MG 24 hr tablet Take 1 tablet (500 mg total) by mouth daily with breakfast. 04/21/24   [name], MD  metoprolol  succinate (TOPROL  XL) 25 MG 24 hr tablet Take 1 tablet (25 mg total) by mouth daily. 10/16/23   Kennyth Chew, MD  Multiple Vitamin (MULTIVITAMIN) tablet Take 1 tablet by mouth every evening.     [provider]  nitroGLYCERIN  (NITROSTAT ) 0.4 MG SL tablet Place 1 tablet (0.4 mg total) under the tongue every 5 (five) minutes as needed for chest pain. 08/22/23 04/21/24  Furth, Cadence H, PA-C  traMADol  (ULTRAM ) 50 MG tablet Take 50 mg by mouth every 6 (six) hours as needed. 08/02/23   [provider]     ALLERGIES:  Allergies  Allergen Reactions   Lovastatin Other (See Comments)    Chest pain   Pravastatin Other (See Comments)    Nose bleed     SOCIAL HISTORY:  Social History   Socioeconomic History   Marital status: Married    Spouse name: Not on file   Number of children: 1   Years of education: Not on file   Highest education level: Associate degree: occupational, Scientist, product/process development, or vocational program  Occupational  History   Occupation: Naval architect    Comment: part time  Tobacco Use   Smoking status: Never   Smokeless tobacco: Never  Vaping Use   Vaping status: Never Used  Substance and Sexual Activity   Alcohol use: Not Currently    Alcohol/week: 0.0 standard drinks of alcohol    Comment: rarely   Drug use: No   Sexual activity: Yes    Birth control/protection: None  Other Topics Concern   Not on file  Social History Narrative   Lives with Information systems manager and wife   Social Drivers of Corporate investment banker Strain: Low Risk  (02/12/2024)   Overall Financial Resource Strain (CARDIA)    Difficulty of Paying Living Expenses: Not very hard  Food Insecurity: No Food Insecurity (02/12/2024)   Hunger Vital Sign    Worried About Running Out of Food in the Last Year: Never true    Ran Out of Food in the Last Year: Never true  Transportation Needs: No Transportation Needs (02/12/2024)   PRAPARE - Transportation    Lack of  Transportation (Medical): No    Lack of Transportation (Non-Medical): No  Physical Activity: Sufficiently Active (02/12/2024)   Exercise Vital Sign    Days of Exercise per Week: 7 days    Minutes of Exercise per Session: 30 min  Stress: No Stress Concern Present (02/12/2024)   [employer] of Occupational Health - Occupational Stress Questionnaire    Feeling of Stress: Not at all  Social Connections: Moderately Isolated (02/12/2024)   Social Connection and Isolation Panel    Frequency of Communication with Friends and Family: More than three times a week    Frequency of Social Gatherings with Friends and Family: More than three times a week    Attends Religious Services: Never    Database administrator or Organizations: No    Attends Banker Meetings: Never    Marital Status: Married  Catering manager Violence: Not At Risk (02/12/2024)   Humiliation, Afraid, Rape, and Kick questionnaire    Fear of Current or Ex-Partner: No    Emotionally Abused: No     Physically Abused: No    Sexually Abused: No     FAMILY HISTORY:  Family History  Problem Relation Age of Onset   CAD Father    Heart attack Father    Heart Problems Sister        CABG   Heart attack Brother    Heart attack Brother    Heart attack Brother    Heart attack Brother    Heart attack Sister    CAD Maternal Uncle    CAD Paternal Uncle       REVIEW OF SYSTEMS:  Review of Systems  Constitutional:  Negative for chills and fever.  Respiratory:  Negative for cough and shortness of breath.   Cardiovascular:  Negative for chest pain and palpitations.  Gastrointestinal:  Positive for abdominal pain and nausea. Negative for constipation, diarrhea and vomiting.  Genitourinary:  Negative for dysuria and urgency.  All other systems reviewed and are negative.   VITAL SIGNS:  Temp:  [97.8 F (36.6 C)-98 F (36.7 C)] 98 F (36.7 C) (10/13 0730) Pulse Rate:  [59-64] 60 (10/13 0849) Resp:  [15-21] 19 (10/13 0849) BP: (154-165)/(64-76) 154/64 (10/13 0730) SpO2:  [95 %-99 %] 99 % (10/13 0849) Weight:  [86.2 kg] 86.2 kg (10/13 0334)     Height: 5' 6 (167.6 cm) Weight: 86.2 kg BMI (Calculated): 30.68   INTAKE/OUTPUT:  No intake/output data recorded.  PHYSICAL EXAM:  Physical Exam Vitals and nursing note reviewed. Exam conducted with a chaperone present.  Constitutional:      General: He is not in acute distress.    Appearance: He is well-developed. He is obese. He is not ill-appearing.     Comments: Resting in bed; NAD  HENT:     Head: Normocephalic and atraumatic.  Eyes:     General: No scleral icterus.    Extraocular Movements: Extraocular movements intact.  Cardiovascular:     Rate and Rhythm: Normal rate.     Heart sounds: Normal heart sounds.  Pulmonary:     Effort: Pulmonary effort is normal. No respiratory distress.  Abdominal:     General: Abdomen is flat. There is no distension.     Palpations: Abdomen is soft.     Tenderness: There is abdominal  tenderness in the right upper quadrant. There is no guarding or rebound. Negative signs include Murphy's sign.     Comments: Abdomen is soft, he is primarily tender in RUQ, non-distended,  no rebound/guarding   Genitourinary:    Comments: Deferred Skin:    General: Skin is warm and dry.     Coloration: Skin is not jaundiced.  Neurological:     General: No focal deficit present.     Mental Status: He is alert and oriented to person, place, and time.  Psychiatric:        Mood and Affect: Mood normal.        Behavior: Behavior normal.      Labs:     Latest Ref Rng & Units 05/19/2024    3:36 AM 08/17/2023    9:42 AM 01/20/2022   10:55 AM  CBC  WBC 4.0 - 10.5 K/uL 17.8  19.0  8.2   Hemoglobin 13.0 - 17.0 g/dL 86.8  84.6  86.0   Hematocrit 39.0 - 52.0 % 40.4  48.1  42.2   Platelets 150 - 400 K/uL 210  216  207       Latest Ref Rng & Units 05/19/2024    3:36 AM 08/17/2023    9:42 AM 02/12/2023    8:24 AM  CMP  Glucose 70 - 99 mg/dL 783  847  838   BUN 8 - 23 mg/dL 20  24  23    Creatinine 0.61 - 1.24 mg/dL 8.90  8.80  8.87   Sodium 135 - 145 mmol/L 136  141  140   Potassium 3.5 - 5.1 mmol/L 4.7  4.7  5.1   Chloride 98 - 111 mmol/L 99  100  104   CO2 22 - 32 mmol/L 25  25  23    Calcium  8.9 - 10.3 mg/dL 8.8  9.0  8.6   Total Protein 6.5 - 8.1 g/dL 6.9  5.8  6.1   Total Bilirubin 0.0 - 1.2 mg/dL 1.1  1.3  0.6   Alkaline Phos 38 - 126 U/L 87  95  92   AST 15 - 41 U/L 24  21  22    ALT 0 - 44 U/L 24  46  27      Imaging studies:   CT Abdomen/Pelvis (05/19/2024) personally reviewed with large gallstones, also note dilation of appendix at the tip without inflammatory changes, and radiologist report reviewed below:  IMPRESSION: 1. Cholelithiasis with subtle ill definition of the gallbladder wall raising the question of edema. Correlation for signs/symptoms of cholecystitis recommended. 2. Mid and tip of the appendix is mildly distended and fluid-filled measuring up to 9 mm  diameter. No periappendiceal edema or inflammation. While tip appendicitis is not considered highly likely, mucocele is not excluded. Correlate clinically and follow-up CT in 3 months recommended to exclude progression. 3. 5 mm nonobstructing interpolar right renal stone. 4. Minimal left colonic diverticulosis without diverticulitis. 5.  Aortic Atherosclerosis (ICD10-I70.0).   RUQ US  (05/19/2024) personally reviewed with cholelithiasis, and radiologist report reviewed below: IMPRESSION: Cholelithiasis without gallbladder wall thickening or pericholecystic fluid. No biliary dilatation.    Assessment/Plan: (ICD-10's: K9.20) 73 y.o. [name] with upper abdominal pain and cholelithiasis also with dilation of appendix   - Overall his clinical picture seems more consistent with biliary colic vs cholecystitis. I have low suspicion for appendicitis at this time. I do think he will likely benefit from cholecystectomy and we can plan for this robotically today pending OR/Anesthesia availability. I do think we can evaluate his appendix intra-operatively and likely remove this as well. Patient is agreeable with this plan  - All risks, benefits, and alternatives to above procedure(s) were discussed with the patient  and his family, all of their questions were answered to their expressed satisfaction, patient expresses he wishes to proceed, and informed consent was obtained.   - ICG on call to OR  - Continue NPO; IVF support  - I will restart Zosyn prophylatically and given leukocytosis  - Monitor abdominal examination; on-going bowel function  - Pain control prn; antiemetics prn   - Appreciate medicine admission   All of the above findings and recommendations were discussed with the patient and his family, and all of patient's and his family's questions were answered to their expressed satisfaction.  Thank you for the opportunity to participate in this patient's care.   -- Arthea Platt,  PA-C [facility] 05/19/2024, 10:21 AM M-F: 7am - 4pm

## 2024-05-19 NOTE — ED Notes (Signed)
 Md informed of pt pain med request

## 2024-05-20 ENCOUNTER — Encounter: Payer: Self-pay | Admitting: General Surgery

## 2024-05-20 DIAGNOSIS — K838 Other specified diseases of biliary tract: Secondary | ICD-10-CM | POA: Diagnosis not present

## 2024-05-20 DIAGNOSIS — Z9049 Acquired absence of other specified parts of digestive tract: Secondary | ICD-10-CM

## 2024-05-20 DIAGNOSIS — I255 Ischemic cardiomyopathy: Secondary | ICD-10-CM | POA: Diagnosis not present

## 2024-05-20 DIAGNOSIS — K810 Acute cholecystitis: Secondary | ICD-10-CM | POA: Diagnosis present

## 2024-05-20 DIAGNOSIS — I442 Atrioventricular block, complete: Secondary | ICD-10-CM | POA: Diagnosis not present

## 2024-05-20 DIAGNOSIS — I10 Essential (primary) hypertension: Secondary | ICD-10-CM | POA: Diagnosis not present

## 2024-05-20 DIAGNOSIS — R112 Nausea with vomiting, unspecified: Secondary | ICD-10-CM | POA: Diagnosis not present

## 2024-05-20 DIAGNOSIS — K2980 Duodenitis without bleeding: Secondary | ICD-10-CM | POA: Diagnosis not present

## 2024-05-20 DIAGNOSIS — Z951 Presence of aortocoronary bypass graft: Secondary | ICD-10-CM | POA: Diagnosis not present

## 2024-05-20 DIAGNOSIS — Y838 Other surgical procedures as the cause of abnormal reaction of the patient, or of later complication, without mention of misadventure at the time of the procedure: Secondary | ICD-10-CM | POA: Diagnosis not present

## 2024-05-20 DIAGNOSIS — K8000 Calculus of gallbladder with acute cholecystitis without obstruction: Secondary | ICD-10-CM | POA: Diagnosis not present

## 2024-05-20 DIAGNOSIS — E782 Mixed hyperlipidemia: Secondary | ICD-10-CM | POA: Diagnosis not present

## 2024-05-20 DIAGNOSIS — E66811 Obesity, class 1: Secondary | ICD-10-CM | POA: Diagnosis not present

## 2024-05-20 DIAGNOSIS — K9181 Other intraoperative complications of digestive system: Secondary | ICD-10-CM | POA: Diagnosis not present

## 2024-05-20 DIAGNOSIS — Z7984 Long term (current) use of oral hypoglycemic drugs: Secondary | ICD-10-CM | POA: Diagnosis not present

## 2024-05-20 DIAGNOSIS — E1165 Type 2 diabetes mellitus with hyperglycemia: Secondary | ICD-10-CM | POA: Diagnosis not present

## 2024-05-20 DIAGNOSIS — K82A1 Gangrene of gallbladder in cholecystitis: Secondary | ICD-10-CM | POA: Diagnosis not present

## 2024-05-20 DIAGNOSIS — I252 Old myocardial infarction: Secondary | ICD-10-CM | POA: Diagnosis not present

## 2024-05-20 DIAGNOSIS — K832 Perforation of bile duct: Secondary | ICD-10-CM | POA: Diagnosis not present

## 2024-05-20 DIAGNOSIS — K5730 Diverticulosis of large intestine without perforation or abscess without bleeding: Secondary | ICD-10-CM | POA: Diagnosis not present

## 2024-05-20 DIAGNOSIS — N200 Calculus of kidney: Secondary | ICD-10-CM | POA: Diagnosis not present

## 2024-05-20 DIAGNOSIS — K819 Cholecystitis, unspecified: Secondary | ICD-10-CM | POA: Diagnosis not present

## 2024-05-20 DIAGNOSIS — I2510 Atherosclerotic heart disease of native coronary artery without angina pectoris: Secondary | ICD-10-CM | POA: Diagnosis not present

## 2024-05-20 DIAGNOSIS — R110 Nausea: Secondary | ICD-10-CM | POA: Diagnosis not present

## 2024-05-20 DIAGNOSIS — K8020 Calculus of gallbladder without cholecystitis without obstruction: Secondary | ICD-10-CM | POA: Diagnosis not present

## 2024-05-20 DIAGNOSIS — Z6831 Body mass index (BMI) 31.0-31.9, adult: Secondary | ICD-10-CM | POA: Diagnosis not present

## 2024-05-20 DIAGNOSIS — K9189 Other postprocedural complications and disorders of digestive system: Secondary | ICD-10-CM | POA: Diagnosis not present

## 2024-05-20 DIAGNOSIS — Z7982 Long term (current) use of aspirin: Secondary | ICD-10-CM | POA: Diagnosis not present

## 2024-05-20 DIAGNOSIS — R1011 Right upper quadrant pain: Secondary | ICD-10-CM | POA: Diagnosis not present

## 2024-05-20 DIAGNOSIS — E785 Hyperlipidemia, unspecified: Secondary | ICD-10-CM | POA: Diagnosis not present

## 2024-05-20 LAB — COMPREHENSIVE METABOLIC PANEL WITH GFR
ALT: 29 U/L (ref 0–44)
AST: 28 U/L (ref 15–41)
Albumin: 3 g/dL — ABNORMAL LOW (ref 3.5–5.0)
Alkaline Phosphatase: 71 U/L (ref 38–126)
Anion gap: 7 (ref 5–15)
BUN: 14 mg/dL (ref 8–23)
CO2: 25 mmol/L (ref 22–32)
Calcium: 7.9 mg/dL — ABNORMAL LOW (ref 8.9–10.3)
Chloride: 102 mmol/L (ref 98–111)
Creatinine, Ser: 0.92 mg/dL (ref 0.61–1.24)
GFR, Estimated: >60 mL/min (ref >60)
Glucose, Bld: 194 mg/dL — ABNORMAL HIGH (ref 70–99)
Potassium: 4.2 mmol/L (ref 3.5–5.1)
Sodium: 134 mmol/L — ABNORMAL LOW (ref 135–145)
Total Bilirubin: 1.5 mg/dL — ABNORMAL HIGH (ref 0.0–1.2)
Total Protein: 5.9 g/dL — ABNORMAL LOW (ref 6.5–8.1)

## 2024-05-20 LAB — CBC
HCT: 35.1 % — ABNORMAL LOW (ref 39.0–52.0)
Hemoglobin: 11.9 g/dL — ABNORMAL LOW (ref 13.0–17.0)
MCH: 27.2 pg (ref 26.0–34.0)
MCHC: 33.9 g/dL (ref 30.0–36.0)
MCV: 80.1 fL (ref 80.0–100.0)
Platelets: 178 K/uL (ref 150–400)
RBC: 4.38 MIL/uL (ref 4.22–5.81)
RDW: 15.7 % — ABNORMAL HIGH (ref 11.5–15.5)
WBC: 20.5 K/uL — ABNORMAL HIGH (ref 4.0–10.5)
nRBC: 0 % (ref 0.0–0.2)

## 2024-05-20 LAB — GLUCOSE, CAPILLARY
Glucose-Capillary: 148 mg/dL — ABNORMAL HIGH (ref 70–99)
Glucose-Capillary: 148 mg/dL — ABNORMAL HIGH (ref 70–99)
Glucose-Capillary: 158 mg/dL — ABNORMAL HIGH (ref 70–99)
Glucose-Capillary: 168 mg/dL — ABNORMAL HIGH (ref 70–99)

## 2024-05-20 LAB — URINE CULTURE: Culture: NO GROWTH

## 2024-05-20 LAB — PROTIME-INR
INR: 1.1 (ref 0.8–1.2)
Prothrombin Time: 15 s (ref 11.4–15.2)

## 2024-05-20 MED ORDER — SODIUM CHLORIDE 0.9 % IV SOLN
INTRAVENOUS | Status: DC
Start: 2024-05-20 — End: 2024-05-20

## 2024-05-20 MED ORDER — SODIUM CHLORIDE 0.9 % IV SOLN: INTRAVENOUS | Status: AC

## 2024-05-20 NOTE — TOC CM/SW Note (Signed)
 Transition of Care [facility]) - Inpatient Brief Assessment   Patient Details  Name: [name] MRN: [id number] Date of Birth: 01/26/1951  Transition of Care [facility]) CM/SW Contact:    Corean ONEIDA Haddock, RN Phone Number: 05/20/2024, 9:06 AM   Clinical Narrative:   Transition of Care [facility]) Screening Note   Patient Details  Name: [name] Date of Birth: 03/06/1951   Transition of Care [facility]) CM/SW Contact:    Corean ONEIDA Haddock, RN Phone Number: 05/20/2024, 9:06 AM    Transition of Care Department [facility]) has reviewed patient and no TOC needs have been identified at this time.  If new patient transition needs arise, please place a TOC consult.    Transition of Care Asessment: Insurance and Status: Insurance coverage has been reviewed Patient has primary care physician: Yes     Prior/Current Home Services: No current home services Social Drivers of Health Review: SDOH reviewed no interventions necessary Readmission risk has been reviewed: No (obs status.  no score generated) Transition of care needs: no transition of care needs at this time

## 2024-05-20 NOTE — Inpatient Diabetes Management (Signed)
 Inpatient Diabetes Program Recommendations  AACE/ADA: New Consensus Statement on Inpatient Glycemic Control (2015)  Target Ranges:  Prepandial:   less than 140 mg/dL      Peak postprandial:   less than 180 mg/dL (1-2 hours)      Critically ill patients:  140 - 180 mg/dL   Lab Results  Component Value Date   GLUCAP 148 (H) 05/20/2024   HGBA1C 8.0 (H) 04/15/2024    Review of Glycemic Control  Latest Reference Range & Units 05/19/24 11:49 05/19/24 16:22 05/19/24 19:27 05/19/24 21:04 05/20/24 07:46 05/20/24 11:41  Glucose-Capillary 70 - 99 mg/dL 842 (H) 895 (H) 777 (H) 236 (H) 148 (H) 148 (H)   Diabetes history: DM 2 Outpatient Diabetes medications:  Metformin  500 mg daily Current orders for Inpatient glycemic control:  Novolog  0-9 units tid with meals and HS  Inpatient Diabetes Program Recommendations:    While NPO, consider increasing frequency of CBG's to q 4 hours.   Note last A1C was 8% indicating DM.  Metformin  was started on 04/21/24.  Needs close follow-up.   Thanks,  Randall Bullocks, RN, BC-ADM Inpatient Diabetes Coordinator Pager [phone]  (8a-5p)

## 2024-05-20 NOTE — Progress Notes (Signed)
 Dressing changed on JP drain due to bloody drainage on gauze. No active  bleeding  noted. Cleansed with NS pat dry and applied split gauze. Patient stood at bedside for 3 minutes.

## 2024-05-20 NOTE — Progress Notes (Signed)
 [facility] SURGICAL PROGRESS NOTE  Hospital Day(s): 0.   Post op day(s): 1 Day Post-Op.   Interval History:  Patient seen and examined No acute events or new complaints overnight.  Patient reports he feels pretty rough Abdomen is distended and sore No fever, chills His WBC worsened this AM; 20.5K Hgb to 11.9 Renal function normal; sCr - 0.92; UO - 800 ccs + unmeasured Surgical drain with 135 ccs out; serosanguinous this AM He is on Zosyn  NPO  Vital signs in last 24 hours: [min-max] current  Temp:  [97.2 F (36.2 C)-99.3 F (37.4 C)] 98.5 F (36.9 C) (10/14 0742) Pulse Rate:  [58-97] 76 (10/14 0742) Resp:  [14-31] 18 (10/14 0742) BP: (100-168)/(45-69) 100/45 (10/14 0742) SpO2:  [87 %-100 %] 97 % (10/14 0742) Weight:  [87.4 kg] 87.4 kg (10/13 1233)     Height: 5' 6 (167.6 cm) Weight: 87.4 kg BMI (Calculated): 31.11   Intake/Output last 2 shifts:  10/13 0701 - 10/14 0700 In: 2237 [P.O.:75; I.V.:2089.4; IV Piggyback:72.5] Out: 935 [Urine:800; Drains:135]   Physical Exam:  Constitutional: alert, cooperative and no distress  Respiratory: breathing non-labored at rest  Cardiovascular: regular rate and sinus rhythm  Gastrointestinal: soft, incisional soreness, and non-distended, no rebound/guarding. Surgical drain in right abdomen; serosanguinous Integumentary: Laparoscopic incisions are covered with band-aids  Labs:     Latest Ref Rng & Units 05/20/2024    5:10 AM 05/19/2024    3:36 AM 08/17/2023    9:42 AM  CBC  WBC 4.0 - 10.5 K/uL 20.5  17.8  19.0   Hemoglobin 13.0 - 17.0 g/dL 88.0  86.8  84.6   Hematocrit 39.0 - 52.0 % 35.1  40.4  48.1   Platelets 150 - 400 K/uL 178  210  216       Latest Ref Rng & Units 05/20/2024    5:10 AM 05/19/2024    3:36 AM 08/17/2023    9:42 AM  CMP  Glucose 70 - 99 mg/dL 805  783  847   BUN 8 - 23 mg/dL 14  20  24    Creatinine 0.61 - 1.24 mg/dL 9.07  8.90  8.80   Sodium 135 - 145 mmol/L 134  136  141    Potassium 3.5 - 5.1 mmol/L 4.2  4.7  4.7   Chloride 98 - 111 mmol/L 102  99  100   CO2 22 - 32 mmol/L 25  25  25    Calcium  8.9 - 10.3 mg/dL 7.9  8.8  9.0   Total Protein 6.5 - 8.1 g/dL 5.9  6.9  5.8   Total Bilirubin 0.0 - 1.2 mg/dL 1.5  1.1  1.3   Alkaline Phos 38 - 126 U/L 71  87  95   AST 15 - 41 U/L 28  24  21    ALT 0 - 44 U/L 29  24  46      Imaging studies: No new pertinent imaging studies   Assessment/Plan: 73 y.o. male 1 Day Post-Op s/p robotic assisted laparoscopic subtotal cholecystectomy for gangrenous cholecystitis    - Case reviewed with Dr Jinny, concern for potential bile leak given need for subtotal cholecystectomy in the setting of severe gangrenous cholecystitis; Appreciate his assistance  - Given his distension, would continue NPO for now as well as to not delay any ERCP timing   - Continue IV Abx (Zosyn)  - Continue surgical drain; monitor and record output - not be surprised if this turns bilious    -  Monitor abdominal examination; on-going bowel function  - Pain control prn; antiemetics prn   - Morning labs - Mobilize as tolerated - Further management per primary service; we will follow    All of the above findings and recommendations were discussed with the patient, and the medical team, and all of patient's questions were answered to his expressed satisfaction.  -- Arthea Platt, PA-C [facility] 05/20/2024, 9:36 AM M-F: 7am - 4pm

## 2024-05-20 NOTE — Plan of Care (Signed)

## 2024-05-20 NOTE — Progress Notes (Addendum)
 Progress Note    [name]  FMW:[id number] DOB: 22-Jul-1951  DOA: 05/19/2024 PCP: [name], MD      Brief Narrative:    Medical records reviewed and are as summarized below:  [name] is a 73 y.o. male with medical history significant for CAD (previous MI) s/p CABG x 4, history of complete heart block post MI, postoperative atrial fibrillation, hypertension, hyperlipidemia, type II DM, nephrolithiasis, inguinal hernia, colonic polyps, who presented to the hospital with nausea and abdominal pain.  Pain was severe and mainly located in the right upper quadrant area.  Initial vital signs in the ED: Temperature 97.8, respiratory 15, pulse 64, BP 164/76, O2 sat 98% on room air.  Labs significant for WBC of 17.8, glucose 216, urinalysis positive for WBCs 21-50 per hpf   CT abdomen and pelvis IMPRESSION: 1. Cholelithiasis with subtle ill definition of the gallbladder wall raising the question of edema. Correlation for signs/symptoms of cholecystitis recommended. 2. Mid and tip of the appendix is mildly distended and fluid-filled measuring up to 9 mm diameter. No periappendiceal edema or inflammation. While tip appendicitis is not considered highly likely, mucocele is not excluded. Correlate clinically and follow-up CT in 3 months recommended to exclude progression. 3. 5 mm nonobstructing interpolar right renal stone. 4. Minimal left colonic diverticulosis without diverticulitis. 5.  Aortic Atherosclerosis (ICD10-I70.0).   Liver ultrasound IMPRESSION: Cholelithiasis without gallbladder wall thickening or pericholecystic fluid. No biliary dilatation.      Assessment/Plan:   Principal Problem:   Acute cholecystitis Active Problems:   History of acute inferior wall MI   S/P CABG x 4   History of complete heart block   HLD (hyperlipidemia)   HTN (hypertension)    Body mass index is 31.1 kg/m.  (Class I obesity)   Acute gangrenous  cholecystitis: S/p robotic assisted laparoscopic subtotal cholecystectomy on 05/19/2024. Worsening leukocytosis (WBC from 17.8-22.5) Patient has been kept n.p.o. because of concern for potential bile leak. Continue empiric IV Zosyn.  Analgesics as needed for pain.  Continue IV fluids for hydration.   CAD s/p CABG: Aspirin  on hold   Hypertension: BP is soft.  Lisinopril  and metoprolol  have been held.   Type II DM hyperglycemia: Metformin  on hold.  NovoLog  as needed for hyperglycemia.   Comorbidities include hyperlipidemia    Diet Order             Diet NPO time specified  Diet effective now                                  Consultants: General Surgeon  Procedures: Robotic assisted laparoscopic subtotal cholecystectomy on 05/19/2024    Medications:    enoxaparin  (LOVENOX ) injection  40 mg Subcutaneous Q24H   insulin  aspart  0-5 Units Subcutaneous QHS   insulin  aspart  0-9 Units Subcutaneous TID WC   Continuous Infusions:  sodium chloride  75 mL/hr at 05/20/24 1029   piperacillin-tazobactam (ZOSYN)  IV 3.375 g (05/20/24 0630)     Anti-infectives (From admission, onward)    Start     Dose/Rate Route Frequency Ordered Stop   05/19/24 1400  piperacillin-tazobactam (ZOSYN) IVPB 3.375 g  Status:  Discontinued        3.375 g 100 mL/hr over 30 Minutes Intravenous Every 8 hours 05/19/24 1022 05/19/24 1030   05/19/24 1400  piperacillin-tazobactam (ZOSYN) IVPB 3.375 g        3.375  g 12.5 mL/hr over 240 Minutes Intravenous Every 8 hours 05/19/24 1030     05/19/24 1030  ceFAZolin  (ANCEF ) IVPB 2g/100 mL premix  Status:  Discontinued        2 g 200 mL/hr over 30 Minutes Intravenous On call to O.R. 05/19/24 1021 05/19/24 1022   05/19/24 0545  piperacillin-tazobactam (ZOSYN) IVPB 3.375 g        3.375 g 100 mL/hr over 30 Minutes Intravenous  Once 05/19/24 0544 05/19/24 0805              Family Communication/Anticipated D/C date and plan/Code  Status   DVT prophylaxis: enoxaparin  (LOVENOX ) injection 40 mg Start: 05/19/24 2000 SCDs Start: 05/19/24 0858     Code Status: Full Code  Family Communication: Plan discussed with his wife at the bedside Disposition Plan: Plan to discharge home   Status is: Inpatient Remains inpatient appropriate because: Acute calculous cholecystitis       Subjective:   Interval events noted.  He complains of abdominal pain but overall he feels a little better today.  No nausea or vomiting.  His wife was at the bedside.  Objective:    Vitals:   05/20/24 0022 05/20/24 0218 05/20/24 0424 05/20/24 0742  BP: 115/62 (!) 128/56 (!) 106/53 (!) 100/45  Pulse: 80 75 81 76  Resp: 16 (!) 22 16 18   Temp: 98.2 F (36.8 C) 98.4 F (36.9 C) 99.3 F (37.4 C) 98.5 F (36.9 C)  TempSrc: Oral  Oral   SpO2: 100% 96% 94% 97%  Weight:      Height:       No data found.   Intake/Output Summary (Last 24 hours) at 05/20/2024 1350 Last data filed at 05/20/2024 0844 Gross per 24 hour  Intake 3167.62 ml  Output 935 ml  Net 2232.62 ml   Filed Weights   05/19/24 0334 05/19/24 1233  Weight: 86.2 kg 87.4 kg    Exam:  GEN: NAD SKIN: Warm and dry EYES: No pallor or icterus ENT: MMM CV: RRR PULM: CTA B ABD: soft, ND, mild surgical tenderness without rebound tenderness or guarding, +BS, JP drain in right upper quadrant with bloody fluid CNS: AAO x 3, non focal EXT: No edema or tenderness        Data Reviewed:   I have personally reviewed following labs and imaging studies:  Labs: Labs show the following:   Basic Metabolic Panel: Recent Labs  Lab 05/19/24 0336 05/20/24 0510  NA 136 134*  K 4.7 4.2  CL 99 102  CO2 25 25  GLUCOSE 216* 194*  BUN 20 14  CREATININE 1.09 0.92  CALCIUM  8.8* 7.9*   GFR Estimated Creatinine Clearance: 72.9 mL/min (by C-G formula based on SCr of 0.92 mg/dL). Liver Function Tests: Recent Labs  Lab 05/19/24 0336 05/20/24 0510  AST 24 28  ALT 24  29  ALKPHOS 87 71  BILITOT 1.1 1.5*  PROT 6.9 5.9*  ALBUMIN  3.9 3.0*   Recent Labs  Lab 05/19/24 0336  LIPASE 26   No results for input(s): AMMONIA in the last 168 hours. Coagulation profile Recent Labs  Lab 05/20/24 0510  INR 1.1    CBC: Recent Labs  Lab 05/19/24 0336 05/20/24 0510  WBC 17.8* 20.5*  HGB 13.1 11.9*  HCT 40.4 35.1*  MCV 82.8 80.1  PLT 210 178   Cardiac Enzymes: No results for input(s): CKTOTAL, CKMB, CKMBINDEX, TROPONINI in the last 168 hours. BNP (last 3 results) No results for input(s): PROBNP in  the last 8760 hours. CBG: Recent Labs  Lab 05/19/24 1622 05/19/24 1927 05/19/24 2104 05/20/24 0746 05/20/24 1141  GLUCAP 104* 222* 236* 148* 148*   D-Dimer: No results for input(s): DDIMER in the last 72 hours. Hgb A1c: No results for input(s): HGBA1C in the last 72 hours. Lipid Profile: No results for input(s): CHOL, HDL, LDLCALC, TRIG, CHOLHDL, LDLDIRECT in the last 72 hours. Thyroid  function studies: No results for input(s): TSH, T4TOTAL, T3FREE, THYROIDAB in the last 72 hours.  Invalid input(s): FREET3 Anemia work up: No results for input(s): VITAMINB12, FOLATE, FERRITIN, TIBC, IRON, RETICCTPCT in the last 72 hours. Sepsis Labs: Recent Labs  Lab 05/19/24 0336 05/20/24 0510  WBC 17.8* 20.5*    Microbiology Recent Results (from the past 240 hours)  Urine Culture     Status: None   Collection Time: 05/19/24  3:36 AM   Specimen: Urine, Clean Catch  Result Value Ref Range Status   Specimen Description   Final    URINE, CLEAN CATCH Performed at [facility], [address], [place], [place] [address]    Special Requests   Final    NONE Performed at [facility], [address], [place], [place] [address]    Culture   Final    NO GROWTH Performed at [facility], [address], [place], [place] [address]    Report Status 05/20/2024 FINAL  Final     Procedures and diagnostic studies:  US  ABDOMEN LIMITED RUQ (LIVER/GB) Result Date: 05/19/2024 CLINICAL DATA:  Gallstones. EXAM: ULTRASOUND ABDOMEN LIMITED RIGHT UPPER QUADRANT COMPARISON:  CT scan earlier same day FINDINGS: Gallbladder: Multiple gallstones evident. Gallbladder wall thickness measured at 2.6 mm, upper normal. No pericholecystic fluid. Sonographer reports no sonographic Murphy sign. Common bile duct: Diameter: 5 mm Liver: No focal liver lesion. Portal vein is patent on color Doppler imaging with normal direction of blood flow towards the liver. Other: None. IMPRESSION: Cholelithiasis without gallbladder wall thickening or pericholecystic fluid. No biliary dilatation. Electronically Signed   By: Camellia Candle M.D.   On: 05/19/2024 07:30   CT ABDOMEN PELVIS W CONTRAST Result Date: 05/19/2024 CLINICAL DATA:  Right-sided abdominal pain leukocytosis. EXAM: CT ABDOMEN AND PELVIS WITH CONTRAST TECHNIQUE: Multidetector CT imaging of the abdomen and pelvis was performed using the standard protocol following bolus administration of intravenous contrast. RADIATION DOSE REDUCTION: This exam was performed according to the departmental dose-optimization program which includes automated exposure control, adjustment of the mA and/or kV according to patient size and/or use of iterative reconstruction technique. CONTRAST:  OMNIPAQUE  IOHEXOL  300 MG/ML  SOLN COMPARISON:  01/02/2015 FINDINGS: Lower chest: No acute findings. Hepatobiliary: A tiny hypodensity in the liver parenchyma is too small to characterize but is statistically most likely benign. No followup imaging is recommended. Trace intrahepatic biliary duct prominence. Gallstones and sludge noted in the gallbladder. Noncalcified stones measure up to 2.7 cm diameter. Subtle ill definition of the gallbladder wall raises the question of edema. No extrahepatic biliary dilation. Pancreas: No focal mass lesion. No dilatation of the main duct. No  intraparenchymal cyst. No peripancreatic edema. Spleen: No splenomegaly. No suspicious focal mass lesion. Adrenals/Urinary Tract: No adrenal nodule or mass. 5 mm nonobstructing interpolar right renal stone. 4.7 cm simple cyst lower pole right kidney increased from 2.8 cm on the study from 9 years ago. Tiny well-defined homogeneous low-density lesions in the right kidney are too small to characterize but statistically most likely benign and probably cysts. No followup imaging is recommended.  Left kidney unremarkable. No evidence for hydroureter. Bladder is decompressed but largely obscured by beam hardening artifact from right hip replacement. Stomach/Bowel: Stomach is unremarkable. No gastric wall thickening. No evidence of outlet obstruction. Duodenum is normally positioned as is the ligament of Treitz. No small bowel wall thickening. No small bowel dilatation. The terminal ileum is normal. The base of the appendix is unremarkable. Mid and tip of the appendix is mildly distended and fluid-filled measuring up to 9 mm diameter. No periappendiceal edema inflammation. Minimal diverticular disease noted left colon without diverticulitis. Vascular/Lymphatic: There is moderate atherosclerotic calcification of the abdominal aorta without aneurysm. There is no gastrohepatic or hepatoduodenal ligament lymphadenopathy. No retroperitoneal or mesenteric lymphadenopathy. No pelvic sidewall lymphadenopathy. Reproductive: Prostate gland is obscured by beam hardening artifact. Other: No intraperitoneal free fluid. Musculoskeletal: Status post right hip replacement. No worrisome lytic or sclerotic osseous abnormality. IMPRESSION: 1. Cholelithiasis with subtle ill definition of the gallbladder wall raising the question of edema. Correlation for signs/symptoms of cholecystitis recommended. 2. Mid and tip of the appendix is mildly distended and fluid-filled measuring up to 9 mm diameter. No periappendiceal edema or inflammation. While  tip appendicitis is not considered highly likely, mucocele is not excluded. Correlate clinically and follow-up CT in 3 months recommended to exclude progression. 3. 5 mm nonobstructing interpolar right renal stone. 4. Minimal left colonic diverticulosis without diverticulitis. 5.  Aortic Atherosclerosis (ICD10-I70.0). Electronically Signed   By: Camellia Candle M.D.   On: 05/19/2024 05:40               LOS: 0 days   [place]  [facility]   Pager on [email or web address]. If 7PM-7AM, please contact night-coverage at [email or web address]     05/20/2024, 1:50 PM

## 2024-05-20 NOTE — Plan of Care (Signed)
  Problem: Metabolic: Goal: Ability to maintain appropriate glucose levels will improve Outcome: Progressing   Problem: Skin Integrity: Goal: Risk for impaired skin integrity will decrease Outcome: Progressing   Problem: Tissue Perfusion: Goal: Adequacy of tissue perfusion will improve Outcome: Progressing   Problem: Clinical Measurements: Goal: Respiratory complications will improve Outcome: Progressing

## 2024-05-20 NOTE — Consult Note (Signed)
 Rogelia Copping, MD [facility]  [address] [place], [place] [address] Phone: [phone] Fax : [phone]  Consultation  Referring Provider:     Dr. Marinda Primary Care Physician:  [name], MD Primary Gastroenterologist:          [facility] Reason for Consultation:     Bile duct leak  Date of Admission:  05/19/2024 Date of Consultation:  05/20/2024       HPI:   [name] is a 73 y.o. male with a history of a MI and coronary artery disease status post CABG with postoperative atrial fibrillation, hypertension hyperlipidemia type 2 diabetes kidney stones inguinal hernia and colon polyps who came to the hospital with nausea vomiting was found to have acute cholecystitis.  The patient was also reporting abdominal pain and underwent a robotic assisted laparoscopic subtotal cholecystectomy for gangrenous cholecystitis.  The patient had a surgical drain placed with the patient being treated for pain control. The patient was quite sleepy when I spoke to him today but was able to answer questions and ask questions.  Past Medical History:  Diagnosis Date   CAD (coronary artery disease)    a. CT scan 01/02/2015: extensive coronary atherosclerosis;  b. 04/2015 Inf STEMI/Cath/CABG x 4: LIMA->LAD, VG->Diag, VG->OM2, VG->RPL; c. 09/2015 MV: EF 55-65%, infsept/inf infarct w/o ischemia->Low Risk.   Colon polyps    Complete heart block (HCC)    a. 04/2015 in setting of inferior STEMI-->resolved.   Difficult intubation    History of kidney stones    Hyperlipidemia    Hypertension    Ischemic cardiomyopathy    a. 04/2015 LV gram: EF 35-40%; b. 09/2015 MV: EF 55-65%.   MI (myocardial infarction) (HCC) 04/2015   Postoperative atrial fibrillation (HCC)    a. 04/2015 Post-op CABG AF/AFlutter--> converted with rapid atrial pacing and amio; b. CHA2DS2VASc = 3->Coumadin .   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   Unilateral inguinal hernia without obstruction or gangrene 04/29/2015   Ureterolithiasis  01/12/2015    Past Surgical History:  Procedure Laterality Date   CARDIAC CATHETERIZATION N/A 05/03/2015   Procedure: Left Heart Cath and Coronary Angiography;  Surgeon: Deatrice DELENA Cage, MD;  Location: [facility] INVASIVE CV LAB;  Service: Cardiovascular;  Laterality: N/A;   CATARACT EXTRACTION, BILATERAL     [facility]   COLONOSCOPY WITH PROPOFOL  N/A 06/02/2022   Procedure: COLONOSCOPY WITH PROPOFOL ;  Surgeon: Maryruth Ole DASEN, MD;  Location: [facility] ENDOSCOPY;  Service: Endoscopy;  Laterality: N/A;   CORONARY ARTERY BYPASS GRAFT N/A 05/03/2015   Procedure: CORONARY ARTERY BYPASS GRAFTING (CABG) times four using left internal mammary artery and right saphenous vein.;  Surgeon: Elspeth JAYSON Millers, MD;  Location: [facility] OR;  Service: Open Heart Surgery;  Laterality: N/A;   CYSTOSCOPY WITH STENT PLACEMENT Left 09/16/2019   Procedure: CYSTOSCOPY WITH STENT PLACEMENT;  Surgeon: Twylla Glendia JAYSON, MD;  Location: [facility] ORS;  Service: Urology;  Laterality: Left;   CYSTOSCOPY/RETROGRADE/URETEROSCOPY Left 09/16/2019   Procedure: CYSTOSCOPY/RETROGRADE/URETEROSCOPY;  Surgeon: Twylla Glendia JAYSON, MD;  Location: [facility] ORS;  Service: Urology;  Laterality: Left;   CYSTOSCOPY/URETEROSCOPY/HOLMIUM LASER/STENT PLACEMENT Left 10/07/2019   Procedure: CYSTOSCOPY/URETEROSCOPY/HOLMIUM LASER/STENT Exhange;  Surgeon: Twylla Glendia JAYSON, MD;  Location: [facility] ORS;  Service: Urology;  Laterality: Left;   EYE SURGERY     HERNIA REPAIR     ventral hernia repair   JOINT REPLACEMENT     KNEE SURGERY Left    arthroscopic knee surgery   NECK SURGERY  Spinal fusion- neck. Dr. Alm Molt of [place] ([facility])   right hip replacement Right 02/04/2014   Dr. Cleotilde   TEE WITHOUT CARDIOVERSION N/A 05/03/2015   Procedure: TRANSESOPHAGEAL ECHOCARDIOGRAM (TEE);  Surgeon: Elspeth JAYSON Millers, MD;  Location: [facility] OR;  Service: Open Heart Surgery;  Laterality: N/A;    Prior to Admission medications   Medication Sig  Start Date End Date Taking? Authorizing Provider  aspirin  81 MG EC tablet Take 1 tablet (81 mg total) by mouth daily. 05/17/15  Yes Vivienne Lonni Ingle, NP  atorvastatin  (LIPITOR ) 80 MG tablet Take 1 tablet by mouth once daily 09/26/23  Yes Arida, Deatrice LABOR, MD  ENSURE PLUS (ENSURE PLUS) LIQD Take 237 mLs by mouth daily.   Yes [provider]  icosapent  Ethyl (VASCEPA ) 1 g capsule Take 2 capsules by mouth twice daily 04/29/24  Yes [name], MD  lisinopril  (ZESTRIL ) 40 MG tablet Take 1 tablet by mouth once daily 08/27/23  Yes Arida, Muhammad A, MD  metFORMIN  (GLUCOPHAGE -XR) 500 MG 24 hr tablet Take 1 tablet (500 mg total) by mouth daily with breakfast. 04/21/24  Yes [name], MD  metoprolol  succinate (TOPROL  XL) 25 MG 24 hr tablet Take 1 tablet (25 mg total) by mouth daily. 10/16/23  Yes Kennyth Chew, MD  Multiple Vitamin (MULTIVITAMIN) tablet Take 1 tablet by mouth every evening.    Yes [provider]  nitroGLYCERIN  (NITROSTAT ) 0.4 MG SL tablet Place 1 tablet (0.4 mg total) under the tongue every 5 (five) minutes as needed for chest pain. 08/22/23 05/19/24 Yes Furth, Cadence H, PA-C  traMADol  (ULTRAM ) 50 MG tablet Take 50 mg by mouth every 6 (six) hours as needed. Patient not taking: Reported on 05/19/2024 08/02/23   [provider]    Family History  Problem Relation Age of Onset   CAD Father    Heart attack Father    Heart Problems Sister        CABG   Heart attack Brother    Heart attack Brother    Heart attack Brother    Heart attack Brother    Heart attack Sister    CAD Maternal Uncle    CAD Paternal Uncle      Social History   Tobacco Use   Smoking status: Never   Smokeless tobacco: Never  Vaping Use   Vaping status: Never Used  Substance Use Topics   Alcohol use: Not Currently    Alcohol/week: 0.0 standard drinks of alcohol    Comment: rarely   Drug use: No    Allergies as of 05/19/2024 - Review Complete 05/19/2024   Allergen Reaction Noted   Lovastatin Other (See Comments) 09/14/2015   Pravastatin Other (See Comments) 09/14/2015    Review of Systems:    All systems reviewed and negative except where noted in HPI.   Physical Exam:  Vital signs in last 24 hours: Temp:  [97.2 F (36.2 C)-99.3 F (37.4 C)] 98.5 F (36.9 C) (10/14 0742) Pulse Rate:  [72-97] 76 (10/14 0742) Resp:  [14-31] 18 (10/14 0742) BP: (100-165)/(45-67) 100/45 (10/14 0742) SpO2:  [87 %-100 %] 97 % (10/14 0742) Last BM Date : 05/17/24 General:   Pleasant, cooperative in NAD Head:  Normocephalic and atraumatic. Eyes:   No icterus.   Conjunctiva pink. PERRLA. Ears:  Normal auditory acuity. Neck:  Supple; no masses or thyroidomegaly Lungs: Respirations even and unlabored. Lungs clear to auscultation bilaterally.   No wheezes, crackles, or rhonchi.  Heart:  Regular  rate and rhythm;  Without murmur, clicks, rubs or gallops Abdomen:  Soft, nondistended, nontender. Normal bowel sounds. No appreciable masses or hepatomegaly.  No rebound or guarding.  Pain in place with serosanguineous fluid in the drain Rectal:  Not performed. Msk:  Symmetrical without gross deformities.   Extremities:  Without edema, cyanosis or clubbing. Neurologic:  Alert and oriented x3;  grossly normal neurologically. Skin:  Intact without significant lesions or rashes. Cervical Nodes:  No significant cervical adenopathy. Psych:  Alert and cooperative. Normal affect.  LAB RESULTS: Recent Labs    05/19/24 0336 05/20/24 0510  WBC 17.8* 20.5*  HGB 13.1 11.9*  HCT 40.4 35.1*  PLT 210 178   BMET Recent Labs    05/19/24 0336 05/20/24 0510  NA 136 134*  K 4.7 4.2  CL 99 102  CO2 25 25  GLUCOSE 216* 194*  BUN 20 14  CREATININE 1.09 0.92  CALCIUM  8.8* 7.9*   LFT Recent Labs    05/20/24 0510  PROT 5.9*  ALBUMIN  3.0*  AST 28  ALT 29  ALKPHOS 71  BILITOT 1.5*   PT/INR Recent Labs    05/20/24 0510  LABPROT 15.0  INR 1.1     STUDIES: US  ABDOMEN LIMITED RUQ (LIVER/GB) Result Date: 05/19/2024 CLINICAL DATA:  Gallstones. EXAM: ULTRASOUND ABDOMEN LIMITED RIGHT UPPER QUADRANT COMPARISON:  CT scan earlier same day FINDINGS: Gallbladder: Multiple gallstones evident. Gallbladder wall thickness measured at 2.6 mm, upper normal. No pericholecystic fluid. Sonographer reports no sonographic Murphy sign. Common bile duct: Diameter: 5 mm Liver: No focal liver lesion. Portal vein is patent on color Doppler imaging with normal direction of blood flow towards the liver. Other: None. IMPRESSION: Cholelithiasis without gallbladder wall thickening or pericholecystic fluid. No biliary dilatation. Electronically Signed   By: Camellia Candle M.D.   On: 05/19/2024 07:30   CT ABDOMEN PELVIS W CONTRAST Result Date: 05/19/2024 CLINICAL DATA:  Right-sided abdominal pain leukocytosis. EXAM: CT ABDOMEN AND PELVIS WITH CONTRAST TECHNIQUE: Multidetector CT imaging of the abdomen and pelvis was performed using the standard protocol following bolus administration of intravenous contrast. RADIATION DOSE REDUCTION: This exam was performed according to the departmental dose-optimization program which includes automated exposure control, adjustment of the mA and/or kV according to patient size and/or use of iterative reconstruction technique. CONTRAST:  OMNIPAQUE  IOHEXOL  300 MG/ML  SOLN COMPARISON:  01/02/2015 FINDINGS: Lower chest: No acute findings. Hepatobiliary: A tiny hypodensity in the liver parenchyma is too small to characterize but is statistically most likely benign. No followup imaging is recommended. Trace intrahepatic biliary duct prominence. Gallstones and sludge noted in the gallbladder. Noncalcified stones measure up to 2.7 cm diameter. Subtle ill definition of the gallbladder wall raises the question of edema. No extrahepatic biliary dilation. Pancreas: No focal mass lesion. No dilatation of the main duct. No intraparenchymal cyst. No  peripancreatic edema. Spleen: No splenomegaly. No suspicious focal mass lesion. Adrenals/Urinary Tract: No adrenal nodule or mass. 5 mm nonobstructing interpolar right renal stone. 4.7 cm simple cyst lower pole right kidney increased from 2.8 cm on the study from 9 years ago. Tiny well-defined homogeneous low-density lesions in the right kidney are too small to characterize but statistically most likely benign and probably cysts. No followup imaging is recommended. Left kidney unremarkable. No evidence for hydroureter. Bladder is decompressed but largely obscured by beam hardening artifact from right hip replacement. Stomach/Bowel: Stomach is unremarkable. No gastric wall thickening. No evidence of outlet obstruction. Duodenum is normally positioned as is the ligament  of Treitz. No small bowel wall thickening. No small bowel dilatation. The terminal ileum is normal. The base of the appendix is unremarkable. Mid and tip of the appendix is mildly distended and fluid-filled measuring up to 9 mm diameter. No periappendiceal edema inflammation. Minimal diverticular disease noted left colon without diverticulitis. Vascular/Lymphatic: There is moderate atherosclerotic calcification of the abdominal aorta without aneurysm. There is no gastrohepatic or hepatoduodenal ligament lymphadenopathy. No retroperitoneal or mesenteric lymphadenopathy. No pelvic sidewall lymphadenopathy. Reproductive: Prostate gland is obscured by beam hardening artifact. Other: No intraperitoneal free fluid. Musculoskeletal: Status post right hip replacement. No worrisome lytic or sclerotic osseous abnormality. IMPRESSION: 1. Cholelithiasis with subtle ill definition of the gallbladder wall raising the question of edema. Correlation for signs/symptoms of cholecystitis recommended. 2. Mid and tip of the appendix is mildly distended and fluid-filled measuring up to 9 mm diameter. No periappendiceal edema or inflammation. While tip appendicitis is not  considered highly likely, mucocele is not excluded. Correlate clinically and follow-up CT in 3 months recommended to exclude progression. 3. 5 mm nonobstructing interpolar right renal stone. 4. Minimal left colonic diverticulosis without diverticulitis. 5.  Aortic Atherosclerosis (ICD10-I70.0). Electronically Signed   By: Camellia Candle M.D.   On: 05/19/2024 05:40      Impression / Plan:   Assessment: Principal Problem:   Acute cholecystitis Active Problems:   History of acute inferior wall MI   S/P CABG x 4   History of complete heart block   HLD (hyperlipidemia)   HTN (hypertension)   [name] is a 73 y.o. y/o male with status post cholecystectomy for gangrenous gallbladder with a partial removal of gallbladder with a drain in place.  I been called for a ERCP and stent placement due to the subtotal cholecystectomy and biliary leak.  Plan:  The patient will be set up for any ERCP due to the patient's subtotal cholecystectomy with need for stent placement.  The patient has been explained the procedure including a stent placement and need for a repeat ERCP in 3 months to have the stent removed.  The patient has also been told the risks of bleeding failed procedure and pancreatitis.  He has also been told the risks of pancreatitis including abdominal pain nausea vomiting and possible death.  The patient has been given the opportunity ask questions and they were answered.  He agrees to proceed with the ERCP.  Thank you for involving me in the care of this patient.      LOS: 0 days   Rogelia Copping, MD, MD. NOLIA 05/20/2024, 2:36 PM,  Pager [phone] 7am-5pm  Check AMION for 5pm -7am coverage and on weekends   Note: This dictation was prepared with Dragon dictation along with smaller phrase technology. Any transcriptional errors that result from this process are unintentional.

## 2024-05-21 ENCOUNTER — Encounter: Payer: Self-pay | Admitting: Gastroenterology

## 2024-05-21 ENCOUNTER — Inpatient Hospital Stay: Admitting: Certified Registered Nurse Anesthetist

## 2024-05-21 ENCOUNTER — Inpatient Hospital Stay

## 2024-05-21 ENCOUNTER — Encounter
Admission: EM | Disposition: A | Discharge status: Home / Self Care | Payer: Self-pay | Source: Home / Self Care | Attending: Internal Medicine

## 2024-05-21 DIAGNOSIS — K810 Acute cholecystitis: Secondary | ICD-10-CM | POA: Diagnosis not present

## 2024-05-21 HISTORY — PX: ERCP: SHX5425

## 2024-05-21 LAB — CBC
HCT: 32.1 % — ABNORMAL LOW (ref 39.0–52.0)
Hemoglobin: 10.4 g/dL — ABNORMAL LOW (ref 13.0–17.0)
MCH: 26.7 pg (ref 26.0–34.0)
MCHC: 32.4 g/dL (ref 30.0–36.0)
MCV: 82.5 fL (ref 80.0–100.0)
Platelets: 162 K/uL (ref 150–400)
RBC: 3.89 MIL/uL — ABNORMAL LOW (ref 4.22–5.81)
RDW: 15.9 % — ABNORMAL HIGH (ref 11.5–15.5)
WBC: 17.9 K/uL — ABNORMAL HIGH (ref 4.0–10.5)
nRBC: 0 % (ref 0.0–0.2)

## 2024-05-21 LAB — MAGNESIUM: Magnesium: 2.1 mg/dL (ref 1.7–2.4)

## 2024-05-21 LAB — GLUCOSE, CAPILLARY
Glucose-Capillary: 152 mg/dL — ABNORMAL HIGH (ref 70–99)
Glucose-Capillary: 155 mg/dL — ABNORMAL HIGH (ref 70–99)
Glucose-Capillary: 207 mg/dL — ABNORMAL HIGH (ref 70–99)
Glucose-Capillary: 153 mg/dL — ABNORMAL HIGH (ref 70–99)

## 2024-05-21 LAB — COMPREHENSIVE METABOLIC PANEL WITH GFR
ALT: 21 U/L (ref 0–44)
AST: 16 U/L (ref 15–41)
Albumin: 2.6 g/dL — ABNORMAL LOW (ref 3.5–5.0)
Alkaline Phosphatase: 58 U/L (ref 38–126)
Anion gap: 7 (ref 5–15)
BUN: 15 mg/dL (ref 8–23)
CO2: 25 mmol/L (ref 22–32)
Calcium: 7.6 mg/dL — ABNORMAL LOW (ref 8.9–10.3)
Chloride: 103 mmol/L (ref 98–111)
Creatinine, Ser: 0.92 mg/dL (ref 0.61–1.24)
GFR, Estimated: >60 mL/min (ref >60)
Glucose, Bld: 142 mg/dL — ABNORMAL HIGH (ref 70–99)
Potassium: 3.5 mmol/L (ref 3.5–5.1)
Sodium: 135 mmol/L (ref 135–145)
Total Bilirubin: 1 mg/dL (ref 0.0–1.2)
Total Protein: 5.6 g/dL — ABNORMAL LOW (ref 6.5–8.1)

## 2024-05-21 LAB — PHOSPHORUS: Phosphorus: 1.8 mg/dL — ABNORMAL LOW (ref 2.5–4.6)

## 2024-05-21 SURGERY — ERCP, WITH INTERVENTION IF INDICATED
Anesthesia: General

## 2024-05-21 MED ORDER — LISINOPRIL 20 MG PO TABS
40.0000 mg | ORAL_TABLET | Freq: Every day | ORAL | Status: DC
  Administered 2024-05-21 – 2024-05-23 (×3): 40 mg via ORAL
  Filled 2024-05-21 (×3): qty 2

## 2024-05-21 MED ORDER — METOPROLOL SUCCINATE ER 25 MG PO TB24
25.0000 mg | ORAL_TABLET | Freq: Every day | ORAL | Status: DC
  Administered 2024-05-21 – 2024-05-23 (×3): 25 mg via ORAL
  Filled 2024-05-21 (×3): qty 1

## 2024-05-21 MED ORDER — DICLOFENAC SUPPOSITORY 100 MG
RECTAL | Status: DC | PRN
Start: 2024-05-21 — End: 2024-05-21
  Administered 2024-05-21: 100 mg via RECTAL

## 2024-05-21 MED ORDER — DICLOFENAC SUPPOSITORY 100 MG
100.0000 mg | Freq: Once | RECTAL | Status: DC
  Administered 2024-05-21: 100 mg via RECTAL

## 2024-05-21 MED ORDER — LACTATED RINGERS IV SOLN: INTRAVENOUS | Status: DC

## 2024-05-21 MED ORDER — ROCURONIUM BROMIDE 100 MG/10ML IV SOLN
INTRAVENOUS | Status: DC | PRN
  Administered 2024-05-21: 30 mg via INTRAVENOUS

## 2024-05-21 MED ORDER — LIDOCAINE HCL (CARDIAC) PF 100 MG/5ML IV SOSY
PREFILLED_SYRINGE | INTRAVENOUS | Status: DC | PRN
  Administered 2024-05-21: 100 mg via INTRAVENOUS

## 2024-05-21 MED ORDER — PROPOFOL 10 MG/ML IV BOLUS
INTRAVENOUS | Status: DC | PRN
  Administered 2024-05-21: 110 mg via INTRAVENOUS

## 2024-05-21 MED ORDER — MORPHINE SULFATE (PF) 2 MG/ML IV SOLN: 2.0000 mg | Freq: Once | INTRAVENOUS | Status: DC

## 2024-05-21 MED ORDER — LIDOCAINE HCL (PF) 2 % IJ SOLN
INTRAMUSCULAR | Status: AC
  Filled 2024-05-21: qty 5

## 2024-05-21 MED ORDER — SUCCINYLCHOLINE CHLORIDE 200 MG/10ML IV SOSY
PREFILLED_SYRINGE | INTRAVENOUS | Status: DC | PRN
  Administered 2024-05-21: 90 mg via INTRAVENOUS

## 2024-05-21 MED ORDER — SUGAMMADEX SODIUM 200 MG/2ML IV SOLN
INTRAVENOUS | Status: DC | PRN
  Administered 2024-05-21: 174.8 mg via INTRAVENOUS

## 2024-05-21 MED ORDER — POTASSIUM PHOSPHATES 15 MMOLE/5ML IV SOLN
30.0000 mmol | Freq: Once | INTRAVENOUS | Status: AC
  Administered 2024-05-21: 30 mmol via INTRAVENOUS
  Filled 2024-05-21: qty 10

## 2024-05-21 MED ORDER — ONDANSETRON HCL 4 MG/2ML IJ SOLN
INTRAMUSCULAR | Status: DC | PRN
  Administered 2024-05-21: 4 mg via INTRAVENOUS

## 2024-05-21 MED ORDER — ONDANSETRON HCL 4 MG/2ML IJ SOLN
INTRAMUSCULAR | Status: AC
  Filled 2024-05-21: qty 2

## 2024-05-21 MED ORDER — DEXAMETHASONE SOD PHOSPHATE PF 10 MG/ML IJ SOLN
INTRAMUSCULAR | Status: DC | PRN
  Administered 2024-05-21: 10 mg via INTRAVENOUS

## 2024-05-21 MED ORDER — PHENYLEPHRINE 80 MCG/ML (10ML) SYRINGE FOR IV PUSH (FOR BLOOD PRESSURE SUPPORT)
PREFILLED_SYRINGE | INTRAVENOUS | Status: DC | PRN
  Administered 2024-05-21: 80 ug via INTRAVENOUS

## 2024-05-21 MED ORDER — DICLOFENAC SUPPOSITORY 100 MG
RECTAL | Status: AC
  Filled 2024-05-21: qty 1

## 2024-05-21 MED ORDER — SUCCINYLCHOLINE CHLORIDE 200 MG/10ML IV SOSY
PREFILLED_SYRINGE | INTRAVENOUS | Status: AC
  Filled 2024-05-21: qty 10

## 2024-05-21 MED ORDER — ALBUTEROL SULFATE HFA 108 (90 BASE) MCG/ACT IN AERS
INHALATION_SPRAY | RESPIRATORY_TRACT | Status: DC | PRN
  Administered 2024-05-21: 3 via RESPIRATORY_TRACT

## 2024-05-21 MED ORDER — PHENYLEPHRINE 80 MCG/ML (10ML) SYRINGE FOR IV PUSH (FOR BLOOD PRESSURE SUPPORT)
PREFILLED_SYRINGE | INTRAVENOUS | Status: AC
  Filled 2024-05-21: qty 10

## 2024-05-21 MED ORDER — FENTANYL CITRATE (PF) 100 MCG/2ML IJ SOLN
INTRAMUSCULAR | Status: DC | PRN
  Administered 2024-05-21: 50 ug via INTRAVENOUS

## 2024-05-21 MED ORDER — FENTANYL CITRATE (PF) 100 MCG/2ML IJ SOLN
INTRAMUSCULAR | Status: AC
  Filled 2024-05-21: qty 2

## 2024-05-21 NOTE — Progress Notes (Signed)
 Mobility Specialist - Progress Note  Pre-mobility: , SpO2-98%  During mobility: , SpO2-86% recovered with recovery breaths in <40 sec to 92%  Post-mobility:  SPO2- 94%   05/21/24 1500  Oxygen Therapy  SpO2 98 %  O2 Device Room Air  Mobility  Activity Ambulated with assistance;Stood at bedside;Respositioned in chair  Level of Assistance Contact guard assist, steadying assist  Assistive Device  (IV stand)  Distance Ambulated (ft) 120 ft  Range of Motion/Exercises All extremities  Activity Response Tolerated well  Mobility visit 1 Mobility  Mobility Specialist Start Time (ACUTE ONLY) 1458  Mobility Specialist Stop Time (ACUTE ONLY) 1519  Mobility Specialist Time Calculation (min) (ACUTE ONLY) 21 min   Pt was at the EOB with nurse and guest in the room upon entry. Pt agreed to mobility. Pt was on O2 earlier, but was not upon entry. Pt was able to STS from EOB independently. Pt ambulated well with IV stand. O2 was brought as a precautionary measure. Pt did need a recovery break during activity. O2 levels did drop under 90%, but recovered in <60 sec. Pt stated that he felt fine but recovery break was effective. After activity pt returned to the room and was repositioned in the recliner with needs in reach and alarm on and guest in the room.  Clem Rodes Mobility Specialist 05/21/24, 4:05 PM

## 2024-05-21 NOTE — Progress Notes (Signed)
 Progress Note    [name]  FMW:[id number] DOB: 06-30-1951  DOA: 05/19/2024 PCP: [name], MD      Brief Narrative:    Medical records reviewed and are as summarized below:  [name] is a 73 y.o. male with medical history significant for CAD (previous MI) s/p CABG x 4, history of complete heart block post MI, postoperative atrial fibrillation, hypertension, hyperlipidemia, type II DM, nephrolithiasis, inguinal hernia, colonic polyps, who presented to the hospital with nausea and abdominal pain.  Pain was severe and mainly located in the right upper quadrant area.  Initial vital signs in the ED: Temperature 97.8, respiratory 15, pulse 64, BP 164/76, O2 sat 98% on room air.  Labs significant for WBC of 17.8, glucose 216, urinalysis positive for WBCs 21-50 per hpf   CT abdomen and pelvis IMPRESSION: 1. Cholelithiasis with subtle ill definition of the gallbladder wall raising the question of edema. Correlation for signs/symptoms of cholecystitis recommended. 2. Mid and tip of the appendix is mildly distended and fluid-filled measuring up to 9 mm diameter. No periappendiceal edema or inflammation. While tip appendicitis is not considered highly likely, mucocele is not excluded. Correlate clinically and follow-up CT in 3 months recommended to exclude progression. 3. 5 mm nonobstructing interpolar right renal stone. 4. Minimal left colonic diverticulosis without diverticulitis. 5.  Aortic Atherosclerosis (ICD10-I70.0).   Liver ultrasound IMPRESSION: Cholelithiasis without gallbladder wall thickening or pericholecystic fluid. No biliary dilatation.      Assessment/Plan:   Principal Problem:   Acute cholecystitis Active Problems:   History of acute inferior wall MI   S/P CABG x 4   History of complete heart block   HLD (hyperlipidemia)   HTN (hypertension)   Bile duct leak    Body mass index is 31.1 kg/m.  (Class I obesity)   Acute  gangrenous cholecystitis: S/p robotic assisted laparoscopic subtotal cholecystectomy on 05/19/2024. Leukocytosis is improving, WBC down from 20.5-17.9. He has been started on clear liquid diet.  Continue IV Zosyn. Follow-up with general surgeon.   Bile leak: S/p ERCP on 05/21/2024.  No bile leak was found.  Biliary sphincterotomy was performed and 1 plastic stent was placed into the common bile duct.  Duodenitis was seen. Follow-up with gastroenterologist for repeat ERCP in 3 months to remove biliary stent.   Hypophosphatemia: Replete with IV potassium phosphate and monitor levels   CAD s/p CABG: Aspirin  on hold   Hypertension: BP stable.  Resume metoprolol  and lisinopril .   Type II DM hyperglycemia: Metformin  on hold.  NovoLog  as needed for hyperglycemia.   Comorbidities include hyperlipidemia     Diet Order             Diet clear liquid Fluid consistency: Thin  Diet effective now                                  Consultants: General Surgeon  Procedures: Robotic assisted laparoscopic subtotal cholecystectomy on 05/19/2024    Medications:    enoxaparin  (LOVENOX ) injection  40 mg Subcutaneous Q24H   insulin  aspart  0-5 Units Subcutaneous QHS   insulin  aspart  0-9 Units Subcutaneous TID WC   lisinopril   40 mg Oral Daily   metoprolol  succinate  25 mg Oral Daily   Continuous Infusions:  sodium chloride  75 mL/hr at 05/21/24 0959   piperacillin-tazobactam (ZOSYN)  IV 3.375 g (05/21/24 0548)   potassium PHOSPHATE IVPB (  in mmol) 30 mmol (05/21/24 1159)     Anti-infectives (From admission, onward)    Start     Dose/Rate Route Frequency Ordered Stop   05/19/24 1400  piperacillin-tazobactam (ZOSYN) IVPB 3.375 g  Status:  Discontinued        3.375 g 100 mL/hr over 30 Minutes Intravenous Every 8 hours 05/19/24 1022 05/19/24 1030   05/19/24 1400  piperacillin-tazobactam (ZOSYN) IVPB 3.375 g        3.375 g 12.5 mL/hr over 240 Minutes Intravenous  Every 8 hours 05/19/24 1030     05/19/24 1030  ceFAZolin  (ANCEF ) IVPB 2g/100 mL premix  Status:  Discontinued        2 g 200 mL/hr over 30 Minutes Intravenous On call to O.R. 05/19/24 1021 05/19/24 1022   05/19/24 0545  piperacillin-tazobactam (ZOSYN) IVPB 3.375 g        3.375 g 100 mL/hr over 30 Minutes Intravenous  Once 05/19/24 0544 05/19/24 0805              Family Communication/Anticipated D/C date and plan/Code Status   DVT prophylaxis: enoxaparin  (LOVENOX ) injection 40 mg Start: 05/19/24 2000 SCDs Start: 05/19/24 0858     Code Status: Full Code  Family Communication: Plan discussed with his wife at the bedside Disposition Plan: Plan to discharge home   Status is: Inpatient Remains inpatient appropriate because: Acute calculous cholecystitis       Subjective:   Interval events noted.  He feels a little better.  No vomiting or abdominal pain.  He had received pain medicine earlier this morning.  His wife is at the bedside.  Objective:    Vitals:   05/21/24 0920 05/21/24 1106 05/21/24 1116 05/21/24 1202  BP: 138/63 (!) 156/72 (!) 150/69 (!) 146/63  Pulse:    74  Resp:    16  Temp: 97.8 F (36.6 C) (!) 97 F (36.1 C)  97.9 F (36.6 C)  TempSrc: Temporal Temporal    SpO2: (!) 89% 94%  94%  Weight:      Height:       No data found.   Intake/Output Summary (Last 24 hours) at 05/21/2024 1400 Last data filed at 05/21/2024 0957 Gross per 24 hour  Intake 563.22 ml  Output 940 ml  Net -376.78 ml   Filed Weights   05/19/24 0334 05/19/24 1233  Weight: 86.2 kg 87.4 kg    Exam:  GEN: NAD SKIN: Warm and dry EYES: No pallor or icterus ENT: MMM CV: RRR PULM: CTA B ABD: soft, ND, mild surgical tenderness, +BS, JP drain in right upper quadrant with bloody fluid CNS: AAO x 3, non focal EXT: No edema or tenderness       Data Reviewed:   I have personally reviewed following labs and imaging studies:  Labs: Labs show the following:    Basic Metabolic Panel: Recent Labs  Lab 05/19/24 0336 05/20/24 0510 05/21/24 0542  NA 136 134* 135  K 4.7 4.2 3.5  CL 99 102 103  CO2 25 25 25   GLUCOSE 216* 194* 142*  BUN 20 14 15   CREATININE 1.09 0.92 0.92  CALCIUM  8.8* 7.9* 7.6*  MG  --   --  2.1  PHOS  --   --  1.8*   GFR Estimated Creatinine Clearance: 72.9 mL/min (by C-G formula based on SCr of 0.92 mg/dL). Liver Function Tests: Recent Labs  Lab 05/19/24 0336 05/20/24 0510 05/21/24 0542  AST 24 28 16   ALT 24 29 21   ALKPHOS  87 71 58  BILITOT 1.1 1.5* 1.0  PROT 6.9 5.9* 5.6*  ALBUMIN  3.9 3.0* 2.6*   Recent Labs  Lab 05/19/24 0336  LIPASE 26   No results for input(s): AMMONIA in the last 168 hours. Coagulation profile Recent Labs  Lab 05/20/24 0510  INR 1.1    CBC: Recent Labs  Lab 05/19/24 0336 05/20/24 0510 05/21/24 0542  WBC 17.8* 20.5* 17.9*  HGB 13.1 11.9* 10.4*  HCT 40.4 35.1* 32.1*  MCV 82.8 80.1 82.5  PLT 210 178 162   Cardiac Enzymes: No results for input(s): CKTOTAL, CKMB, CKMBINDEX, TROPONINI in the last 168 hours. BNP (last 3 results) No results for input(s): PROBNP in the last 8760 hours. CBG: Recent Labs  Lab 05/20/24 1141 05/20/24 1624 05/20/24 2113 05/21/24 0757 05/21/24 1203  GLUCAP 148* 158* 168* 152* 155*   D-Dimer: No results for input(s): DDIMER in the last 72 hours. Hgb A1c: No results for input(s): HGBA1C in the last 72 hours. Lipid Profile: No results for input(s): CHOL, HDL, LDLCALC, TRIG, CHOLHDL, LDLDIRECT in the last 72 hours. Thyroid  function studies: No results for input(s): TSH, T4TOTAL, T3FREE, THYROIDAB in the last 72 hours.  Invalid input(s): FREET3 Anemia work up: No results for input(s): VITAMINB12, FOLATE, FERRITIN, TIBC, IRON, RETICCTPCT in the last 72 hours. Sepsis Labs: Recent Labs  Lab 05/19/24 0336 05/20/24 0510 05/21/24 0542  WBC 17.8* 20.5* 17.9*    Microbiology Recent  Results (from the past 240 hours)  Urine Culture     Status: None   Collection Time: 05/19/24  3:36 AM   Specimen: Urine, Clean Catch  Result Value Ref Range Status   Specimen Description   Final    URINE, CLEAN CATCH Performed at [facility], [address], [place], [place] [address]    Special Requests   Final    NONE Performed at [facility], [address], [place], [place] [address]    Culture   Final    NO GROWTH Performed at [facility], [address] [place], [place] [address]    Report Status 05/20/2024 FINAL  Final    Procedures and diagnostic studies:  DG C-Arm 1-60 Min-No Report Result Date: 05/21/2024 Fluoroscopy was utilized by the requesting physician.  No radiographic interpretation.                LOS: 1 day   Damyn Weitzel  [facility]   Pager on [email or web address]. If 7PM-7AM, please contact night-coverage at [email or web address]     05/21/2024, 2:00 PM

## 2024-05-21 NOTE — Anesthesia Postprocedure Evaluation (Signed)
 Anesthesia Post Note  Patient: [name]  Procedure(s) Performed: ERCP, WITH INTERVENTION IF INDICATED  Patient location during evaluation: Endoscopy Anesthesia Type: General Level of consciousness: awake and alert Pain management: pain level controlled Vital Signs Assessment: post-procedure vital signs reviewed and stable Respiratory status: spontaneous breathing, nonlabored ventilation, respiratory function stable and patient connected to nasal cannula oxygen Cardiovascular status: blood pressure returned to baseline and stable Postop Assessment: no apparent nausea or vomiting Anesthetic complications: no   No notable events documented.   Last Vitals:  Vitals:   05/21/24 1116 05/21/24 1202  BP: (!) 150/69 (!) 146/63  Pulse:  74  Resp:  16  Temp:  36.6 C  SpO2:  94%    Last Pain:  Vitals:   05/21/24 1116  TempSrc:   PainSc: 0-No pain                 Lendia LITTIE Mae

## 2024-05-21 NOTE — Anesthesia Preprocedure Evaluation (Signed)
 Anesthesia Evaluation  Patient identified by MRN, date of birth, ID band Patient awake    Reviewed: Allergy & Precautions, NPO status , Patient's Chart, lab work & pertinent test results  History of Anesthesia Complications Negative for: history of anesthetic complications  Airway Mallampati: III  TM Distance: >3 FB Neck ROM: full    Dental  (+) Chipped   Pulmonary neg pulmonary ROS, neg shortness of breath   Pulmonary exam normal        Cardiovascular Exercise Tolerance: Good hypertension, (-) angina + CAD, + Past MI and + CABG  (-) Cardiac Stents Normal cardiovascular exam+ dysrhythmias (-) Valvular Problems/Murmurs     Neuro/Psych neg Seizures negative neurological ROS  negative psych ROS   GI/Hepatic negative GI ROS, Neg liver ROS,neg GERD  ,,  Endo/Other  negative endocrine ROS    Renal/GU Renal disease (kidney stones)  negative genitourinary   Musculoskeletal   Abdominal   Peds  Hematology negative hematology ROS (+)   Anesthesia Other Findings Past Medical History: No date: CAD (coronary artery disease)     Comment:  a. CT scan 01/02/2015: extensive coronary               atherosclerosis;  b. 04/2015 Inf STEMI/Cath/CABG x 4:               LIMA->LAD, VG->Diag, VG->OM2, VG->RPL; c. 09/2015 MV: EF               55-65%, infsept/inf infarct w/o ischemia->Low Risk. No date: Colon polyps No date: Complete heart block (HCC)     Comment:  a. 04/2015 in setting of inferior STEMI-->resolved. No date: Difficult intubation No date: History of kidney stones No date: Hyperlipidemia No date: Hypertension No date: Ischemic cardiomyopathy     Comment:  a. 04/2015 LV gram: EF 35-40%; b. 09/2015 MV: EF 55-65%. 04/2015: MI (myocardial infarction) (HCC) No date: Postoperative atrial fibrillation (HCC)     Comment:  a. 04/2015 Post-op CABG AF/AFlutter--> converted with               rapid atrial pacing and amio; b. CHA2DS2VASc =                3->Coumadin . 04/2015: S/P CABG x 4 04/29/2015: Unilateral inguinal hernia without obstruction or gangrene 01/12/2015: Ureterolithiasis  Past Surgical History: 05/03/2015: CARDIAC CATHETERIZATION; N/A     Comment:  Procedure: Left Heart Cath and Coronary Angiography;                Surgeon: Deatrice DELENA Cage, MD;  Location: [facility] INVASIVE               CV LAB;  Service: Cardiovascular;  Laterality: N/A; No date: CATARACT EXTRACTION, BILATERAL     Comment:  [facility] 06/02/2022: COLONOSCOPY WITH PROPOFOL ; N/A     Comment:  Procedure: COLONOSCOPY WITH PROPOFOL ;  Surgeon:               Maryruth Ole DASEN, MD;  Location: [facility] ENDOSCOPY;                Service: Endoscopy;  Laterality: N/A; 05/03/2015: CORONARY ARTERY BYPASS GRAFT; N/A     Comment:  Procedure: CORONARY ARTERY BYPASS GRAFTING (CABG) times               four using left internal mammary artery and right               saphenous vein.;  Surgeon: Elspeth JAYSON Millers, MD;  Location: [facility] OR;  Service: Open Heart Surgery;                Laterality: N/A; 09/16/2019: CYSTOSCOPY WITH STENT PLACEMENT; Left     Comment:  Procedure: CYSTOSCOPY WITH STENT PLACEMENT;  Surgeon:               Twylla Glendia BROCKS, MD;  Location: [facility] ORS;  Service:               Urology;  Laterality: Left; 09/16/2019: CYSTOSCOPY/RETROGRADE/URETEROSCOPY; Left     Comment:  Procedure: CYSTOSCOPY/RETROGRADE/URETEROSCOPY;  Surgeon:              Twylla Glendia BROCKS, MD;  Location: [facility] ORS;  Service:               Urology;  Laterality: Left; 10/07/2019: CYSTOSCOPY/URETEROSCOPY/HOLMIUM LASER/STENT PLACEMENT;  Left     Comment:  Procedure: CYSTOSCOPY/URETEROSCOPY/HOLMIUM LASER/STENT               Exhange;  Surgeon: Twylla Glendia BROCKS, MD;  Location: [facility]               ORS;  Service: Urology;  Laterality: Left; No date: EYE SURGERY No date: HERNIA REPAIR     Comment:  ventral hernia repair No date: JOINT REPLACEMENT No date: KNEE  SURGERY; Left     Comment:  arthroscopic knee surgery 05/19/2024: LAPAROSCOPIC LYSIS OF ADHESIONS; N/A     Comment:  Procedure: LYSIS, ADHESIONS, LAPAROSCOPIC;  Surgeon:               Marinda Jayson KIDD, MD;  Location: [facility] ORS;  Service:               General;  Laterality: N/A; No date: NECK SURGERY     Comment:  Spinal fusion- neck. Dr. Alm Molt of [place]               ([facility]) 02/04/2014: right hip replacement; Right     Comment:  Dr. Cleotilde 05/03/2015: TEE WITHOUT CARDIOVERSION; N/A     Comment:  Procedure: TRANSESOPHAGEAL ECHOCARDIOGRAM (TEE);                Surgeon: Elspeth BROCKS Millers, MD;  Location: [facility] OR;                Service: Open Heart Surgery;  Laterality: N/A;  BMI    Body Mass Index: 31.10 kg/m      Reproductive/Obstetrics negative OB ROS                              Anesthesia Physical Anesthesia Plan  ASA: 3  Anesthesia Plan: General ETT   Post-op Pain Management:    Induction: Intravenous  PONV Risk Score and Plan: Ondansetron , Dexamethasone , Midazolam  and Treatment may vary due to age or medical condition  Airway Management Planned: Oral ETT  Additional Equipment:   Intra-op Plan:   Post-operative Plan: Extubation in OR  Informed Consent: I have reviewed the patients History and Physical, chart, labs and discussed the procedure including the risks, benefits and alternatives for the proposed anesthesia with the patient or authorized representative who has indicated his/her understanding and acceptance.     Dental Advisory Given  Plan Discussed with: Anesthesiologist, CRNA and Surgeon  Anesthesia Plan Comments: (Patient consented for risks of anesthesia including but not limited to:  - adverse reactions to medications - risk of airway placement if required - damage to eyes,  teeth, lips or other oral mucosa - nerve damage due to positioning  - sore throat or hoarseness - Damage to heart, brain,  nerves, lungs, other parts of body or loss of life  Patient voiced understanding.)         Anesthesia Quick Evaluation

## 2024-05-21 NOTE — Anesthesia Procedure Notes (Signed)
 Procedure Name: Intubation Date/Time: 05/21/2024 10:11 AM  Performed by: Duwayne Craven, CRNAPre-anesthesia Checklist: Patient identified, Patient being monitored, Timeout performed, Emergency Drugs available and Suction available Patient Re-evaluated:Patient Re-evaluated prior to induction Oxygen Delivery Method: Circle system utilized Preoxygenation: Pre-oxygenation with 100% oxygen Induction Type: IV induction Ventilation: Mask ventilation without difficulty Laryngoscope Size: McGrath and 4 Grade View: Grade III Tube type: Oral Tube size: 7.5 mm Number of attempts: 1 Airway Equipment and Method: Bougie stylet and Video-laryngoscopy Placement Confirmation: ETT inserted through vocal cords under direct vision, positive ETCO2 and breath sounds checked- equal and bilateral Secured at: 22 cm Tube secured with: Tape Dental Injury: Teeth and Oropharynx as per pre-operative assessment  Difficulty Due To: Difficulty was anticipated, Difficult Airway- due to reduced neck mobility and Difficult Airway- due to limited oral opening Comments: Pt with extremely limited neck mobility.  Grade 3 view on laryngoscopy with Macgrath.  Bougie stylet used to place ETT.

## 2024-05-21 NOTE — Plan of Care (Signed)

## 2024-05-21 NOTE — Op Note (Signed)
 [facility] Gastroenterology Patient Name: [name] Procedure Date: 05/21/2024 10:19 AM MRN: [id number] Account #: [id number] Date of Birth: [birth year] Admit Type: Inpatient Age: 73 Room: [facility] ENDO ROOM 4 Gender: Male Note Status: Finalized Instrument Name: CELINDA [id number] Procedure:             ERCP Indications:           Bile leak Providers:             Rogelia Copping MD, MD Referring MD:          Nancyann BRAVO. Gasper, MD (Referring MD) Medicines:             General Anesthesia Complications:         No immediate complications. Procedure:             Pre-Anesthesia Assessment:                        - Prior to the procedure, a History and Physical was                         performed, and patient medications and allergies were                         reviewed. The patient's tolerance of previous                         anesthesia was also reviewed. The risks and benefits                         of the procedure and the sedation options and risks                         were discussed with the patient. All questions were                         answered, and informed consent was obtained. Prior                         Anticoagulants: The patient has taken no anticoagulant                         or antiplatelet agents. ASA Grade Assessment: II - A                         patient with mild systemic disease. After reviewing                         the risks and benefits, the patient was deemed in                         satisfactory condition to undergo the procedure.                        After obtaining informed consent, the scope was passed                         under direct vision. Throughout the procedure, the  patient's blood pressure, pulse, and oxygen                         saturations were monitored continuously. The                         Duodenoscope was introduced through the mouth, and                         used to  inject contrast into and used to inject                         contrast into the bile duct. The ERCP was accomplished                         without difficulty. The patient tolerated the                         procedure well. Findings:      A scout film of the abdomen was obtained. One percutaneous drain ending       in the Subhepatic space was seen. The major papilla was normal. The bile       duct was deeply cannulated with the short-nosed traction sphincterotome.       Contrast was injected. I personally interpreted the bile duct images.       There was brisk flow of contrast through the ducts. Image quality was       excellent. Contrast extended to the entire biliary tree. Extravasation       of contrast originating from the cystic duct was observed. A wire was       passed into the biliary tree. The biliary sphincterotomy was extended to       a total of 5 mm in length with a traction (standard) sphincterotome       using ERBE electrocautery. There was no post-sphincterotomy bleeding.       One 10 Fr by 7 cm plastic stent with a single external flap and a single       internal flap was placed 6 cm into the common bile duct. Bile flowed       through the stent. The stent was in good position. Impression:            - The major papilla appeared normal.                        - A bile leak was found.                        - A biliary sphincterotomy was performed.                        - One plastic stent was placed into the common bile                         duct.                        - Duodenitis seen. Recommendation:        - Return patient to hospital ward for ongoing care.                        -  Clear liquid diet.                        - Watch for pancreatitis, bleeding, perforation, and                         cholangitis.                        - Repeat ERCP in 3 months to remove stent. Procedure Code(s):     --- Professional ---                        [phone],  Endoscopic retrograde cholangiopancreatography                         (ERCP); with placement of endoscopic stent into                         biliary or pancreatic duct, including pre- and                         post-dilation and guide wire passage, when performed,                         including sphincterotomy, when performed, each stent                        25671, Endoscopic catheterization of the biliary                         ductal system, radiological supervision and                         interpretation Diagnosis Code(s):     --- Professional ---                        K83.8, Other specified diseases of biliary tract CPT copyright 2022 American Medical Association. All rights reserved. The codes documented in this report are preliminary and upon coder review may  be revised to meet current compliance requirements. Rogelia Copping MD, MD 05/21/2024 11:12:27 AM This report has been signed electronically. Number of Addenda: 0 Note Initiated On: 05/21/2024 10:19 AM Estimated Blood Loss:  Estimated blood loss: none.      [facility]

## 2024-05-21 NOTE — Progress Notes (Signed)
 Patient reports continued soreness today but says distention feels like it is improved. Did have some CLD yesterday. On exam abdomen still distended but improved. Drain with SS output, no bile yet. Labs reviewed, t bilirubin decreasing. WBC improving. Plan for ERCP today for stent placement. Okay from surgical standpoint to start diet, clears, after this.

## 2024-05-21 NOTE — Plan of Care (Signed)

## 2024-05-21 NOTE — Transfer of Care (Signed)
 Immediate Anesthesia Transfer of Care Note  Patient: [name]  Procedure(s) Performed: ERCP, WITH INTERVENTION IF INDICATED  Patient Location: PACU  Anesthesia Type:General  Level of Consciousness: awake, alert , and oriented  Airway & Oxygen Therapy: Patient Spontanous Breathing  Post-op Assessment: Report given to RN and Post -op Vital signs reviewed and stable  Post vital signs: Reviewed  Last Vitals:  Vitals Value Taken Time  BP 156/72 05/21/24 11:07  Temp 36.1 C 05/21/24 11:06  Pulse 87 05/21/24 11:14  Resp 20 05/21/24 11:14  SpO2 96 % 05/21/24 11:14  Vitals shown include unfiled device data.  Last Pain:  Vitals:   05/21/24 1106  TempSrc: Temporal  PainSc:       Patients Stated Pain Goal: 1 (05/19/24 2005)  Complications: No notable events documented.

## 2024-05-22 DIAGNOSIS — K838 Other specified diseases of biliary tract: Secondary | ICD-10-CM

## 2024-05-22 DIAGNOSIS — Z8679 Personal history of other diseases of the circulatory system: Secondary | ICD-10-CM

## 2024-05-22 DIAGNOSIS — E782 Mixed hyperlipidemia: Secondary | ICD-10-CM

## 2024-05-22 DIAGNOSIS — I10 Essential (primary) hypertension: Secondary | ICD-10-CM

## 2024-05-22 DIAGNOSIS — I252 Old myocardial infarction: Secondary | ICD-10-CM

## 2024-05-22 LAB — RENAL FUNCTION PANEL
Albumin: 2.8 g/dL — ABNORMAL LOW (ref 3.5–5.0)
Anion gap: 13 (ref 5–15)
BUN: 23 mg/dL (ref 8–23)
CO2: 22 mmol/L (ref 22–32)
Calcium: 7.9 mg/dL — ABNORMAL LOW (ref 8.9–10.3)
Chloride: 102 mmol/L (ref 98–111)
Creatinine, Ser: 1.12 mg/dL (ref 0.61–1.24)
GFR, Estimated: >60 mL/min (ref >60)
Glucose, Bld: 167 mg/dL — ABNORMAL HIGH (ref 70–99)
Phosphorus: 2.6 mg/dL (ref 2.5–4.6)
Potassium: 4 mmol/L (ref 3.5–5.1)
Sodium: 137 mmol/L (ref 135–145)

## 2024-05-22 LAB — CBC
HCT: 32.1 % — ABNORMAL LOW (ref 39.0–52.0)
Hemoglobin: 10.6 g/dL — ABNORMAL LOW (ref 13.0–17.0)
MCH: 26.9 pg (ref 26.0–34.0)
MCHC: 33 g/dL (ref 30.0–36.0)
MCV: 81.5 fL (ref 80.0–100.0)
Platelets: 203 K/uL (ref 150–400)
RBC: 3.94 MIL/uL — ABNORMAL LOW (ref 4.22–5.81)
RDW: 15.6 % — ABNORMAL HIGH (ref 11.5–15.5)
WBC: 20.3 K/uL — ABNORMAL HIGH (ref 4.0–10.5)
nRBC: 0 % (ref 0.0–0.2)

## 2024-05-22 LAB — GLUCOSE, CAPILLARY
Glucose-Capillary: 155 mg/dL — ABNORMAL HIGH (ref 70–99)
Glucose-Capillary: 139 mg/dL — ABNORMAL HIGH (ref 70–99)
Glucose-Capillary: 180 mg/dL — ABNORMAL HIGH (ref 70–99)
Glucose-Capillary: 168 mg/dL — ABNORMAL HIGH (ref 70–99)

## 2024-05-22 LAB — SURGICAL PATHOLOGY

## 2024-05-22 MED ORDER — SIMETHICONE 80 MG PO CHEW: 80.0000 mg | CHEWABLE_TABLET | Freq: Four times a day (QID) | ORAL | Status: DC | PRN

## 2024-05-22 NOTE — Care Management Important Message (Signed)
 Important Message  Patient Details  Name: [name] MRN: [id number] Date of Birth: June 17, 1951   Important Message Given:  Yes - Medicare IM     [name] 05/22/2024, 11:56 AM

## 2024-05-22 NOTE — Discharge Instructions (Signed)
 In addition to included general post-operative instructions,  Diet: Resume home diet. Recommend avoiding or limiting fatty/greasy foods over the next few days/week. If you do eat these, you may (or may not) notice diarrhea. This is expected while your body adjusts to not having a gallbladder, and it typically resolves with time.    Activity: No heavy lifting >20 pounds (children, pets, laundry, garbage) or strenuous activity for 4 weeks, but light activity and walking are encouraged. Do not drive or drink alcohol if taking narcotic pain medications or having pain that might distract from driving.  Drain: Monitor and record drain output daily. Hand out provided for this. Please bring this with you to your follow up appointment.   Wound care: If you can keep drain site covered, you may shower/get incision wet with soapy water and pat dry (do not rub incisions), but no baths or submerging incision underwater until follow-up.   Medications: Resume all home medications. For mild to moderate pain: acetaminophen  (Tylenol ) or ibuprofen/naproxen (if no kidney disease). Combining Tylenol  with alcohol can substantially increase your risk of causing liver disease. Narcotic pain medications, if prescribed, can be used for severe pain, though may cause nausea, constipation, and drowsiness. Do not combine Tylenol  and Percocet (or similar) within a 6 hour period as Percocet (and similar) contain(s) Tylenol . If you do not need the narcotic pain medication, you do not need to fill the prescription.  Call office: [phone] / [phone]) at any time if any questions, worsening pain, fevers/chills, bleeding, drainage from incision site, or other concerns.

## 2024-05-22 NOTE — Progress Notes (Signed)
 Rogelia Copping, MD [facility]   [address], [address] [place], [place] [address] Phone: [phone] Fax : [phone]   Subjective: The patient reports some lower abdominal pain that he has had postsurgically.  He denies any nausea vomiting or epigastric or central abdominal pain consistent with pancreatitis.  The patient has been on a clear liquid diet.  He has no complaints at the present time.  His drainage showed only serosanguineous fluid   Objective: Vital signs in last 24 hours: Vitals:   05/21/24 1202 05/21/24 1500 05/21/24 2012 05/22/24 0502  BP: (!) 146/63  131/70 135/62  Pulse: 74  70 64  Resp: 16  16 16   Temp: 97.9 F (36.6 C)  98.1 F (36.7 C) 98.1 F (36.7 C)  TempSrc:   Oral Oral  SpO2: 94% 98% 92% 93%  Weight:      Height:       Weight change:   Intake/Output Summary (Last 24 hours) at 05/22/2024 9275 Last data filed at 05/22/2024 9383 Gross per 24 hour  Intake 119.76 ml  Output 290 ml  Net -170.24 ml     Exam: General: Patient sitting up in bed and alert and oriented x 3 no apparent distress Abdomen : Drain in place with some blood around the drain site and serosanguineous fluid in the bulb   Lab Results: @LABTEST2 @ Micro Results: Recent Results (from the past 240 hours)  Urine Culture     Status: None   Collection Time: 05/19/24  3:36 AM   Specimen: Urine, Clean Catch  Result Value Ref Range Status   Specimen Description   Final    URINE, CLEAN CATCH Performed at [facility], [address], [place], [place] [address]    Special Requests   Final    NONE Performed at [facility], [address] [place], [place] [address]    Culture   Final    NO GROWTH Performed at [facility], [address], [place], [place] [address]    Report Status 05/20/2024 FINAL  Final   Studies/Results: DG C-Arm 1-60 Min-No Report Result Date: 05/21/2024 Fluoroscopy was utilized by the requesting physician.  No radiographic  interpretation.   Medications: I have reviewed the patient's current medications. Scheduled Meds:  enoxaparin  (LOVENOX ) injection  40 mg Subcutaneous Q24H   insulin  aspart  0-5 Units Subcutaneous QHS   insulin  aspart  0-9 Units Subcutaneous TID WC   lisinopril   40 mg Oral Daily   metoprolol  succinate  25 mg Oral Daily   Continuous Infusions:  piperacillin-tazobactam (ZOSYN)  IV 3.375 g (05/22/24 0616)   PRN Meds:.acetaminophen  **OR** acetaminophen , morphine  injection, oxyCODONE , polyethylene glycol   Assessment: Principal Problem:   Acute cholecystitis Active Problems:   History of acute inferior wall MI   S/P CABG x 4   History of complete heart block   HLD (hyperlipidemia)   HTN (hypertension)   Bile duct leak    Plan: The patient is status post ERCP with stent placement with the need for repeat ERCP in 3 months.  The patient is doing well and will have his diet advanced.  Nothing further to do from GI point of view.  I will sign off.  Please call if any further GI concerns or questions.  We would like to thank you for the opportunity to participate in the care of [name].    LOS: 2 days   Rogelia Copping, MD.FACG 05/22/2024, 7:24 AM Pager [phone] 7am-5pm  Check AMION for 5pm -7am coverage and on weekends

## 2024-05-22 NOTE — Evaluation (Signed)
 Physical Therapy Evaluation Patient Details Name: [name] MRN: [id number] DOB: [birth year] Today's Date: 05/22/2024  History of Present Illness  73 y/o male s/p cholecystectomy 10/15.  Clinical Impression  Pt did well with PT exam showing ability to do mobility, ambulation, stair negotiation and generally not needing any direct assist.  He is not at his baseline and was slower and more guarded as well as having some issues with moderate balance challenges.  Discussed benefits of HHPT despite his being relatively independent with basic mobility tasks.  Pt to consider further PT per his continued improvement.  Will maintain on caseload while here to insure safe transition to baseline/home and maximize mobility while here.      If plan is discharge home, recommend the following:     Can travel by private vehicle        Equipment Recommendations None recommended by PT  Recommendations for Other Services       Functional Status Assessment Patient has had a recent decline in their functional status and demonstrates the ability to make significant improvements in function in a reasonable and predictable amount of time.     Precautions / Restrictions Precautions Precautions:  (low fall risk) Restrictions Weight Bearing Restrictions Per Provider Order: No      Mobility  Bed Mobility Overal bed mobility: Independent             General bed mobility comments: easily transitions up to sitting    Transfers Overall transfer level: Independent Equipment used: None               General transfer comment: easily transitions to/from sit/stand without issue    Ambulation/Gait Ambulation/Gait assistance: Independent Gait Distance (Feet): 150 Feet Assistive device: None         General Gait Details: Pt able to ambulate with good confidence and safety, though he does have some minimal guarding and hesitancy that he does endorse as different/slower than his  baseline  Stairs Stairs: Yes Stairs assistance: Modified independent (Device/Increase time) Stair Management: One rail Left, Alternating pattern Number of Stairs: 6 General stair comments: Pt was able to negotiate up/down steps without issue.  Did rely on holding rail, but no LOBs or overt safety issues  Wheelchair Mobility     Tilt Bed    Modified Rankin (Stroke Patients Only)       Balance Overall balance assessment: Needs assistance Sitting-balance support: No upper extremity supported Sitting balance-Leahy Scale: Normal     Standing balance support: No upper extremity supported Standing balance-Leahy Scale: Fair Standing balance comment: Pt did well with static and low challenge balance tasks, but displayed unsteadiness and minA/self arrested LOBs with higher level challenges eg: SLS, tandem ambulation                             Pertinent Vitals/Pain Pain Assessment Pain Assessment: 0-10 Pain Score: 2  Pain Location: drain location, R flank    Home Living Family/patient expects to be discharged to:: Private residence Living Arrangements: Spouse/significant other Available Help at Discharge: Family   Home Access: Stairs to enter Entrance Stairs-Rails: Left Entrance Stairs-Number of Steps: 3   Home Layout: One level Home Equipment: Agricultural consultant (2 wheels);Cane - single point;Shower seat - built in      Prior Function Prior Level of Function : Independent/Modified Independent             Mobility Comments: PT drives, stays busy around  the home, no AD, etc ADLs Comments: independent     Extremity/Trunk Assessment   Upper Extremity Assessment Upper Extremity Assessment: Overall WFL for tasks assessed    Lower Extremity Assessment Lower Extremity Assessment: Overall WFL for tasks assessed       Communication   Communication Communication: No apparent difficulties    Cognition Arousal: Alert Behavior During Therapy: WFL for  tasks assessed/performed   PT - Cognitive impairments: No apparent impairments                         Following commands: Intact       Cueing       General Comments General comments (skin integrity, edema, etc.): Pt moved relatively well, but is not at his baseline and did display some issues with higher level balance challenges. O2 dropped to low 90s with modest ambulation effort    Exercises     Assessment/Plan    PT Assessment Patient needs continued PT services  PT Problem List Decreased strength;Decreased activity tolerance;Decreased balance;Decreased safety awareness;Pain       PT Treatment Interventions Gait training;Stair training;Balance training;Therapeutic exercise;Therapeutic activities    PT Goals (Current goals can be found in the Care Plan section)  Acute Rehab PT Goals Patient Stated Goal: Go home PT Goal Formulation: With patient Time For Goal Achievement: 06/04/24 Potential to Achieve Goals: Good    Frequency Min 1X/week     Co-evaluation               AM-PAC PT 6 Clicks Mobility  Outcome Measure Help needed turning from your back to your side while in a flat bed without using bedrails?: None Help needed moving from lying on your back to sitting on the side of a flat bed without using bedrails?: None Help needed moving to and from a bed to a chair (including a wheelchair)?: None Help needed standing up from a chair using your arms (e.g., wheelchair or bedside chair)?: None Help needed to walk in hospital room?: None Help needed climbing 3-5 steps with a railing? : None 6 Click Score: 24    End of Session   Activity Tolerance: Patient tolerated treatment well;Patient limited by fatigue Patient left: in chair;with call bell/phone within reach;with family/visitor present Nurse Communication: Mobility status PT Visit Diagnosis: Unsteadiness on feet (R26.81);Muscle weakness (generalized) (M62.81)    Time: 8640-8583 PT Time  Calculation (min) (ACUTE ONLY): 17 min   Charges:   PT Evaluation $PT Eval Low Complexity: 1 Low   PT General Charges $$ ACUTE PT VISIT: 1 Visit         [name], DPT 05/22/2024, 2:40 PM

## 2024-05-22 NOTE — Progress Notes (Signed)
 [facility] SURGICAL PROGRESS NOTE  Hospital Day(s): 2.   Post op day(s): 1 Day Post-Op.   Interval History:  Patient seen and examined No acute events or new complaints overnight.  Patient reports he is feeling much better this morning Distension improved No fever, chills, nausea, emesis  Continues to have leukocytosis; WBC 20.3K Hgb to 10.6 Renal function normal; sCr - 1.12; UO - 200 ccs + unmeasured Drain with 90 ccs out; serosanguinous CLD; no issue Passing flatus   Vital signs in last 24 hours: [min-max] current  Temp:  [97 F (36.1 C)-98.2 F (36.8 C)] 98.1 F (36.7 C) (10/16 0502) Pulse Rate:  [48-74] 64 (10/16 0502) Resp:  [16-17] 16 (10/16 0502) BP: (128-156)/(62-72) 135/62 (10/16 0502) SpO2:  [89 %-98 %] 93 % (10/16 0502)     Height: 5' 6 (167.6 cm) Weight: 87.4 kg BMI (Calculated): 31.11   Intake/Output last 2 shifts:  10/15 0701 - 10/16 0700 In: 119.8 [IV Piggyback:119.8] Out: 290 [Urine:200; Drains:90]   Physical Exam:  Constitutional: alert, cooperative and no distress  Respiratory: breathing non-labored at rest  Cardiovascular: regular rate and sinus rhythm  Gastrointestinal: soft, non-tender, and non-distended. Surgical drain in right abdomen; output serosanguinous Integumentary: Laparoscopic incisions are CDI with staples; no erythema   Labs:     Latest Ref Rng & Units 05/22/2024    5:41 AM 05/21/2024    5:42 AM 05/20/2024    5:10 AM  CBC  WBC 4.0 - 10.5 K/uL 20.3  17.9  20.5   Hemoglobin 13.0 - 17.0 g/dL 89.3  89.5  88.0   Hematocrit 39.0 - 52.0 % 32.1  32.1  35.1   Platelets 150 - 400 K/uL 203  162  178       Latest Ref Rng & Units 05/22/2024    5:41 AM 05/21/2024    5:42 AM 05/20/2024    5:10 AM  CMP  Glucose 70 - 99 mg/dL 832  857  805   BUN 8 - 23 mg/dL 23  15  14    Creatinine 0.61 - 1.24 mg/dL 8.87  9.07  9.07   Sodium 135 - 145 mmol/L 137  135  134   Potassium 3.5 - 5.1 mmol/L 4.0  3.5  4.2   Chloride 98 - 111  mmol/L 102  103  102   CO2 22 - 32 mmol/L 22  25  25    Calcium  8.9 - 10.3 mg/dL 7.9  7.6  7.9   Total Protein 6.5 - 8.1 g/dL  5.6  5.9   Total Bilirubin 0.0 - 1.2 mg/dL  1.0  1.5   Alkaline Phos 38 - 126 U/L  58  71   AST 15 - 41 U/L  16  28   ALT 0 - 44 U/L  21  29      Imaging studies: No new pertinent imaging studies   Assessment/Plan:  73 y.o. male 1 Day Post-Op s/p robotic assisted laparoscopic subtotal cholecystectomy for gangrenous cholecystitis s/p ERCP on 10/15               - Okay to advance diet as tolerated - reviewed recommendation in setting of cholecystectomy   - Appreciate GI assistance; ERCP reviewed; will need repeat ERCP in ~3 months              - Continue IV Abx (Zosyn)             - Continue surgical drain; monitor and record output - he  will go home with this              - Monitor abdominal examination; on-going bowel function  - Pain control prn; antiemetics prn  - Mobilize as tolerated - Further management per primary service; we will follow     - Discharge Planning: From surgical perspective he is doing well. Anticipate he will need another 24 hours observation and for IV Abx; Hopefully home tomorrow (10/17)  All of the above findings and recommendations were discussed with the patient, patient's family (wife at bedside), and the medical team, and all of patient's and family's questions were answered to their expressed satisfaction.  -- Arthea Platt, PA-C [facility] 05/22/2024, 7:22 AM M-F: 7am - 4pm

## 2024-05-22 NOTE — Progress Notes (Signed)
 Progress Note    [name]  FMW:[id number] DOB: Mar 12, 1951  DOA: 05/19/2024 PCP: [name], MD      Brief Narrative:    Medical records reviewed and are as summarized below:  [name] is a 73 y.o. male with medical history significant for CAD (previous MI) s/p CABG x 4, history of complete heart block post MI, postoperative atrial fibrillation, hypertension, hyperlipidemia, type II DM, nephrolithiasis, inguinal hernia, colonic polyps, who presented to the hospital with nausea and abdominal pain.  Pain was severe and mainly located in the right upper quadrant area.  Initial vital signs in the ED: Temperature 97.8, respiratory 15, pulse 64, BP 164/76, O2 sat 98% on room air.  Labs significant for WBC of 17.8, glucose 216, urinalysis positive for WBCs 21-50 per hpf   CT abdomen and pelvis IMPRESSION: 1. Cholelithiasis with subtle ill definition of the gallbladder wall raising the question of edema. Correlation for signs/symptoms of cholecystitis recommended. 2. Mid and tip of the appendix is mildly distended and fluid-filled measuring up to 9 mm diameter. No periappendiceal edema or inflammation. While tip appendicitis is not considered highly likely, mucocele is not excluded. Correlate clinically and follow-up CT in 3 months recommended to exclude progression. 3. 5 mm nonobstructing interpolar right renal stone. 4. Minimal left colonic diverticulosis without diverticulitis. 5.  Aortic Atherosclerosis (ICD10-I70.0).   Liver ultrasound IMPRESSION: Cholelithiasis without gallbladder wall thickening or pericholecystic fluid. No biliary dilatation.  10/16: Vital stable with worsening leukocytosis but patient has ERCP for bile leak with a stent placement yesterday, will need a repeat ERCP in 3 months per GI. Surgery is advancing diet and if continue to tolerate likely can go home tomorrow.  PT is recommending home health.  Assessment/Plan:   Principal  Problem:   Acute cholecystitis Active Problems:   History of acute inferior wall MI   S/P CABG x 4   History of complete heart block   HLD (hyperlipidemia)   HTN (hypertension)   Bile duct leak    Body mass index is 31.1 kg/m.  (Class I obesity)   Acute gangrenous cholecystitis: S/p robotic assisted laparoscopic subtotal cholecystectomy on 05/19/2024. Complicated by bile leak, slight worsening of leukocytosis today as he has ERCP yesterday -Currently diet advanced to soft -  Continue IV Zosyn. Follow-up with general surgeon.   Bile leak: S/p ERCP on 05/21/2024.  No bile leak was found.  Biliary sphincterotomy was performed and 1 plastic stent was placed into the common bile duct.  Duodenitis was seen. Follow-up with gastroenterologist for repeat ERCP in 3 months to remove biliary stent.  Hypophosphatemia: Improved with repletion - Continue to monitor  CAD s/p CABG: Aspirin  on hold   Hypertension: BP stable.  Resume metoprolol  and lisinopril .   Type II DM hyperglycemia: Metformin  on hold.  NovoLog  as needed for hyperglycemia.   Comorbidities include hyperlipidemia  Diet Order             DIET SOFT Room service appropriate? Yes; Fluid consistency: Thin  Diet effective 1000                  Consultants: General Surgeon Gastroenterology  Procedures: Robotic assisted laparoscopic subtotal cholecystectomy on 05/19/2024 ERCP with stent placement  Medications:    enoxaparin  (LOVENOX ) injection  40 mg Subcutaneous Q24H   insulin  aspart  0-5 Units Subcutaneous QHS   insulin  aspart  0-9 Units Subcutaneous TID WC   lisinopril   40 mg Oral Daily  metoprolol  succinate  25 mg Oral Daily   Continuous Infusions:  piperacillin-tazobactam (ZOSYN)  IV 3.375 g (05/22/24 1440)     Anti-infectives (From admission, onward)    Start     Dose/Rate Route Frequency Ordered Stop   05/19/24 1400  piperacillin-tazobactam (ZOSYN) IVPB 3.375 g  Status:  Discontinued         3.375 g 100 mL/hr over 30 Minutes Intravenous Every 8 hours 05/19/24 1022 05/19/24 1030   05/19/24 1400  piperacillin-tazobactam (ZOSYN) IVPB 3.375 g        3.375 g 12.5 mL/hr over 240 Minutes Intravenous Every 8 hours 05/19/24 1030     05/19/24 1030  ceFAZolin  (ANCEF ) IVPB 2g/100 mL premix  Status:  Discontinued        2 g 200 mL/hr over 30 Minutes Intravenous On call to O.R. 05/19/24 1021 05/19/24 1022   05/19/24 0545  piperacillin-tazobactam (ZOSYN) IVPB 3.375 g        3.375 g 100 mL/hr over 30 Minutes Intravenous  Once 05/19/24 0544 05/19/24 0805       Family Communication/Anticipated D/C date and plan/Code Status   DVT prophylaxis: enoxaparin  (LOVENOX ) injection 40 mg Start: 05/19/24 2000 SCDs Start: 05/19/24 0858     Code Status: Full Code  Family Communication: Discussed with wife at bedside.  Disposition Plan: Plan to discharge home   Status is: Inpatient Remains inpatient appropriate because: Acute calculous cholecystitis  Subjective:   Patient was sitting comfortably in chair when seen today.  Denies any pain.  Tolerating diet.  Objective:    Vitals:   05/22/24 0744 05/22/24 0744 05/22/24 0800 05/22/24 0900  BP: (!) 141/63  133/78   Pulse: (!) 39 (!) 59 68   Resp: 18     Temp: 97.8 F (36.6 C)  97.6 F (36.4 C)   TempSrc: Oral  Oral   SpO2: 93% 94%  94%  Weight:      Height:       No data found.   Intake/Output Summary (Last 24 hours) at 05/22/2024 1654 Last data filed at 05/22/2024 1422 Gross per 24 hour  Intake 119.76 ml  Output 260 ml  Net -140.24 ml   Filed Weights   05/19/24 0334 05/19/24 1233  Weight: 86.2 kg 87.4 kg    Exam:  General.  Well-developed elderly man, in no acute distress. Pulmonary.  Lungs clear bilaterally, normal respiratory effort. CV.  Regular rate and rhythm, no JVD, rub or murmur. Abdomen.  Soft, nontender, nondistended, BS positive. CNS.  Alert and oriented .  No focal neurologic deficit. Extremities.  No  edema, no cyanosis, pulses intact and symmetrical. Psychiatry.  Judgment and insight appears normal.   Data Reviewed:   I have personally reviewed following labs and imaging studies:  Labs: Labs show the following:   Basic Metabolic Panel: Recent Labs  Lab 05/19/24 0336 05/20/24 0510 05/21/24 0542 05/22/24 0541  NA 136 134* 135 137  K 4.7 4.2 3.5 4.0  CL 99 102 103 102  CO2 25 25 25 22   GLUCOSE 216* 194* 142* 167*  BUN 20 14 15 23   CREATININE 1.09 0.92 0.92 1.12  CALCIUM  8.8* 7.9* 7.6* 7.9*  MG  --   --  2.1  --   PHOS  --   --  1.8* 2.6   GFR Estimated Creatinine Clearance: 59.9 mL/min (by C-G formula based on SCr of 1.12 mg/dL). Liver Function Tests: Recent Labs  Lab 05/19/24 0336 05/20/24 0510 05/21/24 0542 05/22/24 0541  AST  24 28 16   --   ALT 24 29 21   --   ALKPHOS 87 71 58  --   BILITOT 1.1 1.5* 1.0  --   PROT 6.9 5.9* 5.6*  --   ALBUMIN  3.9 3.0* 2.6* 2.8*   Recent Labs  Lab 05/19/24 0336  LIPASE 26   No results for input(s): AMMONIA in the last 168 hours. Coagulation profile Recent Labs  Lab 05/20/24 0510  INR 1.1    CBC: Recent Labs  Lab 05/19/24 0336 05/20/24 0510 05/21/24 0542 05/22/24 0541  WBC 17.8* 20.5* 17.9* 20.3*  HGB 13.1 11.9* 10.4* 10.6*  HCT 40.4 35.1* 32.1* 32.1*  MCV 82.8 80.1 82.5 81.5  PLT 210 178 162 203   Cardiac Enzymes: No results for input(s): CKTOTAL, CKMB, CKMBINDEX, TROPONINI in the last 168 hours. BNP (last 3 results) No results for input(s): PROBNP in the last 8760 hours. CBG: Recent Labs  Lab 05/21/24 1203 05/21/24 1742 05/21/24 2139 05/22/24 0746 05/22/24 1206  GLUCAP 155* 207* 153* 155* 139*   D-Dimer: No results for input(s): DDIMER in the last 72 hours. Hgb A1c: No results for input(s): HGBA1C in the last 72 hours. Lipid Profile: No results for input(s): CHOL, HDL, LDLCALC, TRIG, CHOLHDL, LDLDIRECT in the last 72 hours. Thyroid  function studies: No results for  input(s): TSH, T4TOTAL, T3FREE, THYROIDAB in the last 72 hours.  Invalid input(s): FREET3 Anemia work up: No results for input(s): VITAMINB12, FOLATE, FERRITIN, TIBC, IRON, RETICCTPCT in the last 72 hours. Sepsis Labs: Recent Labs  Lab 05/19/24 0336 05/20/24 0510 05/21/24 0542 05/22/24 0541  WBC 17.8* 20.5* 17.9* 20.3*    Microbiology Recent Results (from the past 240 hours)  Urine Culture     Status: None   Collection Time: 05/19/24  3:36 AM   Specimen: Urine, Clean Catch  Result Value Ref Range Status   Specimen Description   Final    URINE, CLEAN CATCH Performed at [facility], [address] [place], [place] [address]    Special Requests   Final    NONE Performed at [facility], [address], [place], [place] [address]    Culture   Final    NO GROWTH Performed at [facility], [address], [place], [place] [address]    Report Status 05/20/2024 FINAL  Final    Procedures and diagnostic studies:  DG C-Arm 1-60 Min-No Report Result Date: 05/21/2024 Fluoroscopy was utilized by the requesting physician.  No radiographic interpretation.    LOS: 2 days   Ciearra Rufo. MD  [facility]   Pager on [email or web address]. If 7PM-7AM, please contact night-coverage at [email or web address]  05/22/2024, 4:54 PM

## 2024-05-22 NOTE — Progress Notes (Signed)
 Mobility Specialist - Progress Note  Pre-mobility: SpO2-94%  During mobility: , SpO2-88% recovered to 90% in less than 10 sec  Post-mobility:SPO2-92%     05/22/24 0900  Oxygen Therapy  SpO2 94 %  O2 Device Room Air  Mobility  Activity Ambulated with assistance;Respositioned in chair  Level of Assistance Contact guard assist, steadying assist  Assistive Device  (IV Stand)  Distance Ambulated (ft) 160 ft  Range of Motion/Exercises Active  Activity Response Tolerated well  Mobility visit 1 Mobility  Mobility Specialist Start Time (ACUTE ONLY) 0854  Mobility Specialist Stop Time (ACUTE ONLY) 0904  Mobility Specialist Time Calculation (min) (ACUTE ONLY) 10 min   Pt was in recliner with guest in the room upon entry. Pt agreed to mobility. O2 was taken throughout activity as a precaution. Pt is receiving fluids through IV. Pt is able to STS independently with no AD. Pt ambulated well. During activity two recovery breaks taken as precaution. Pt during mobility pt O2 decreased to 88% then recovered to 90% in less than 15 sec. After activity pt returned to the room. Pt is in repositioned in recliner with needs in reach and guest in room upon exit.  Clem Rodes Mobility Specialist 05/22/24, 9:14 AM

## 2024-05-23 DIAGNOSIS — Z951 Presence of aortocoronary bypass graft: Secondary | ICD-10-CM

## 2024-05-23 DIAGNOSIS — R110 Nausea: Secondary | ICD-10-CM

## 2024-05-23 LAB — CBC
HCT: 34.2 % — ABNORMAL LOW (ref 39.0–52.0)
Hemoglobin: 11.2 g/dL — ABNORMAL LOW (ref 13.0–17.0)
MCH: 27.1 pg (ref 26.0–34.0)
MCHC: 32.7 g/dL (ref 30.0–36.0)
MCV: 82.6 fL (ref 80.0–100.0)
Platelets: 246 K/uL (ref 150–400)
RBC: 4.14 MIL/uL — ABNORMAL LOW (ref 4.22–5.81)
RDW: 15.6 % — ABNORMAL HIGH (ref 11.5–15.5)
WBC: 16.4 K/uL — ABNORMAL HIGH (ref 4.0–10.5)
nRBC: 0 % (ref 0.0–0.2)

## 2024-05-23 LAB — GLUCOSE, CAPILLARY: Glucose-Capillary: 138 mg/dL — ABNORMAL HIGH (ref 70–99)

## 2024-05-23 MED ORDER — TRAMADOL HCL 50 MG PO TABS: 50.0000 mg | ORAL_TABLET | Freq: Four times a day (QID) | ORAL | 0 refills | Status: AC | PRN

## 2024-05-23 MED ORDER — ACETAMINOPHEN 325 MG PO TABS: 650.0000 mg | ORAL_TABLET | Freq: Four times a day (QID) | ORAL | 0 refills | Status: AC | PRN

## 2024-05-23 MED ORDER — AMOXICILLIN-POT CLAVULANATE 875-125 MG PO TABS: 1.0000 | ORAL_TABLET | Freq: Two times a day (BID) | ORAL | 0 refills | Status: AC

## 2024-05-23 NOTE — Discharge Summary (Signed)
 Physician Discharge Summary   Patient: [name] MRN: [id number] DOB: 03-10-1951  Admit date:     05/19/2024  Discharge date: 05/23/24  Discharge Physician: Amaryllis Dare   PCP: [name], MD   Recommendations at discharge:  Please obtain CBC and CMP on follow-up Follow-up with general surgery-patient is being discharged with drain Follow-up with gastroenterology-need repeat ERCP in 6-month Follow-up with primary care provider  Discharge Diagnoses: Principal Problem:   Acute cholecystitis Active Problems:   History of acute inferior wall MI   S/P CABG x 4   History of complete heart block   HLD (hyperlipidemia)   HTN (hypertension)   Bile duct leak   Nausea   Hospital Course: [name] is a 73 y.o. male with medical history significant for CAD (previous MI) s/p CABG x 4, history of complete heart block post MI, postoperative atrial fibrillation, hypertension, hyperlipidemia, type II DM, nephrolithiasis, inguinal hernia, colonic polyps, who presented to the hospital with nausea and abdominal pain.  Pain was severe and mainly located in the right upper quadrant area.   He was found to have acute gangrenous cholecystitis, general surgery was consulted and he was taken to the OR S/p robotic assisted laparoscopic subtotal cholecystectomy on 05/19/2024. Complicated by bile leak, so gastroenterology was consulted and patient underwent ERCP.  No definitive bile leak was found.  He had biliary sphincterectomy with a plastic stent placement in the common bile duct.  Noted to have duodenitis.  Patient did had slight worsening of leukocytosis after ERCP which started improving.  Pain improved and he is tolerating diet.  Having bowel movement.  Patient is being discharged with drain in place.  Received Zosyn while in the hospital and he will continue with Augmentin for 6 more days to complete a total of 10-day antibiotics.  Patient will follow-up with general surgery for drain  assistance and removal.  Patient also need to follow-up with GI and have a repeat ERCP with stent removal in 40-month.  He will continue with the rest of his home medications and need to have a close follow-up with his providers for further assistance.   Consultants: Surgery.  Gastroenterology. Procedures performed: Laparoscopic cholecystectomy.  ERCP with stent placement. Disposition: Home health Diet recommendation:  Discharge Diet Orders (From admission, onward)     Start     Ordered   05/23/24 0000  Diet - low sodium heart healthy        05/23/24 1048           Cardiac and Carb modified diet DISCHARGE MEDICATION: Allergies as of 05/23/2024       Reactions   Lovastatin Other (See Comments)   Chest pain   Pravastatin Other (See Comments)   Nose bleed        Medication List     TAKE these medications    acetaminophen  325 MG tablet Commonly known as: TYLENOL  Take 2 tablets (650 mg total) by mouth every 6 (six) hours as needed for mild pain (pain score 1-3) or fever (or Fever >/= 101).   amoxicillin -clavulanate 875-125 MG tablet Commonly known as: AUGMENTIN Take 1 tablet by mouth 2 (two) times daily for 6 days.   aspirin  EC 81 MG tablet Take 1 tablet (81 mg total) by mouth daily.   atorvastatin  80 MG tablet Commonly known as: LIPITOR  Take 1 tablet by mouth once daily   Ensure Plus Liqd Take 237 mLs by mouth daily.   icosapent  Ethyl 1 g capsule Commonly known  as: VASCEPA  Take 2 capsules by mouth twice daily   lisinopril  40 MG tablet Commonly known as: ZESTRIL  Take 1 tablet by mouth once daily   metFORMIN  500 MG 24 hr tablet Commonly known as: GLUCOPHAGE -XR Take 1 tablet (500 mg total) by mouth daily with breakfast.   metoprolol  succinate 25 MG 24 hr tablet Commonly known as: Toprol  XL Take 1 tablet (25 mg total) by mouth daily.   multivitamin tablet Take 1 tablet by mouth every evening.   nitroGLYCERIN  0.4 MG SL tablet Commonly known as:  NITROSTAT  Place 1 tablet (0.4 mg total) under the tongue every 5 (five) minutes as needed for chest pain.   traMADol  50 MG tablet Commonly known as: Ultram  Take 1 tablet (50 mg total) by mouth every 6 (six) hours as needed.               Discharge Care Instructions  (From admission, onward)           Start     Ordered   05/23/24 0000  Leave dressing on - Keep it clean, dry, and intact until clinic visit        05/23/24 1048            Follow-up Information     Marinda Jayson KIDD, MD. Go on 05/29/2024.   Specialty: General Surgery Why: Go to appointment on 10/23 at 945 AM Contact information: [address] [place] [address] [phone]         [name], MD. Schedule an appointment as soon as possible for a visit in 1 week(s).   Specialty: Family Medicine Contact information: [address] [place] [address] [phone]         Jinny Carmine, MD. Schedule an appointment as soon as possible for a visit in 1 month(s).   Specialty: Gastroenterology Contact information: [address] [place]  [place] [address] [phone]                Discharge Exam: Fredricka Weights   05/19/24 0334 05/19/24 1233  Weight: 86.2 kg 87.4 kg   General.  Well-developed gentleman, in no acute distress. Pulmonary.  Lungs clear bilaterally, normal respiratory effort. CV.  Regular rate and rhythm, no JVD, rub or murmur. Abdomen.  Soft, nontender, nondistended, BS positive. CNS.  Alert and oriented .  No focal neurologic deficit. Extremities.  No edema, no cyanosis, pulses intact and symmetrical. Psychiatry.  Judgment and insight appears normal.   Condition at discharge: stable  The results of significant diagnostics from this hospitalization (including imaging, microbiology, ancillary and laboratory) are listed below for reference.   Imaging Studies: DG C-Arm 1-60 Min-No Report Result Date: 05/21/2024 Fluoroscopy was utilized  by the requesting physician.  No radiographic interpretation.   US  ABDOMEN LIMITED RUQ (LIVER/GB) Result Date: 05/19/2024 CLINICAL DATA:  Gallstones. EXAM: ULTRASOUND ABDOMEN LIMITED RIGHT UPPER QUADRANT COMPARISON:  CT scan earlier same day FINDINGS: Gallbladder: Multiple gallstones evident. Gallbladder wall thickness measured at 2.6 mm, upper normal. No pericholecystic fluid. Sonographer reports no sonographic Murphy sign. Common bile duct: Diameter: 5 mm Liver: No focal liver lesion. Portal vein is patent on color Doppler imaging with normal direction of blood flow towards the liver. Other: None. IMPRESSION: Cholelithiasis without gallbladder wall thickening or pericholecystic fluid. No biliary dilatation. Electronically Signed   By: Camellia Candle M.D.   On: 05/19/2024 07:30   CT ABDOMEN PELVIS W CONTRAST Result Date: 05/19/2024 CLINICAL DATA:  Right-sided abdominal pain leukocytosis. EXAM: CT ABDOMEN  AND PELVIS WITH CONTRAST TECHNIQUE: Multidetector CT imaging of the abdomen and pelvis was performed using the standard protocol following bolus administration of intravenous contrast. RADIATION DOSE REDUCTION: This exam was performed according to the departmental dose-optimization program which includes automated exposure control, adjustment of the mA and/or kV according to patient size and/or use of iterative reconstruction technique. CONTRAST:  OMNIPAQUE  IOHEXOL  300 MG/ML  SOLN COMPARISON:  01/02/2015 FINDINGS: Lower chest: No acute findings. Hepatobiliary: A tiny hypodensity in the liver parenchyma is too small to characterize but is statistically most likely benign. No followup imaging is recommended. Trace intrahepatic biliary duct prominence. Gallstones and sludge noted in the gallbladder. Noncalcified stones measure up to 2.7 cm diameter. Subtle ill definition of the gallbladder wall raises the question of edema. No extrahepatic biliary dilation. Pancreas: No focal mass lesion. No dilatation of  the main duct. No intraparenchymal cyst. No peripancreatic edema. Spleen: No splenomegaly. No suspicious focal mass lesion. Adrenals/Urinary Tract: No adrenal nodule or mass. 5 mm nonobstructing interpolar right renal stone. 4.7 cm simple cyst lower pole right kidney increased from 2.8 cm on the study from 9 years ago. Tiny well-defined homogeneous low-density lesions in the right kidney are too small to characterize but statistically most likely benign and probably cysts. No followup imaging is recommended. Left kidney unremarkable. No evidence for hydroureter. Bladder is decompressed but largely obscured by beam hardening artifact from right hip replacement. Stomach/Bowel: Stomach is unremarkable. No gastric wall thickening. No evidence of outlet obstruction. Duodenum is normally positioned as is the ligament of Treitz. No small bowel wall thickening. No small bowel dilatation. The terminal ileum is normal. The base of the appendix is unremarkable. Mid and tip of the appendix is mildly distended and fluid-filled measuring up to 9 mm diameter. No periappendiceal edema inflammation. Minimal diverticular disease noted left colon without diverticulitis. Vascular/Lymphatic: There is moderate atherosclerotic calcification of the abdominal aorta without aneurysm. There is no gastrohepatic or hepatoduodenal ligament lymphadenopathy. No retroperitoneal or mesenteric lymphadenopathy. No pelvic sidewall lymphadenopathy. Reproductive: Prostate gland is obscured by beam hardening artifact. Other: No intraperitoneal free fluid. Musculoskeletal: Status post right hip replacement. No worrisome lytic or sclerotic osseous abnormality. IMPRESSION: 1. Cholelithiasis with subtle ill definition of the gallbladder wall raising the question of edema. Correlation for signs/symptoms of cholecystitis recommended. 2. Mid and tip of the appendix is mildly distended and fluid-filled measuring up to 9 mm diameter. No periappendiceal edema or  inflammation. While tip appendicitis is not considered highly likely, mucocele is not excluded. Correlate clinically and follow-up CT in 3 months recommended to exclude progression. 3. 5 mm nonobstructing interpolar right renal stone. 4. Minimal left colonic diverticulosis without diverticulitis. 5.  Aortic Atherosclerosis (ICD10-I70.0). Electronically Signed   By: Camellia Candle M.D.   On: 05/19/2024 05:40    Microbiology: Results for orders placed or performed during the hospital encounter of 05/19/24  Urine Culture     Status: None   Collection Time: 05/19/24  3:36 AM   Specimen: Urine, Clean Catch  Result Value Ref Range Status   Specimen Description   Final    URINE, CLEAN CATCH Performed at [facility], [address] [place], [place] [address]    Special Requests   Final    NONE Performed at [facility], [address] [place], [place] [address]    Culture   Final    NO GROWTH Performed at [facility], [address], [place], [place] [address]    Report  Status 05/20/2024 FINAL  Final    Labs: CBC: Recent Labs  Lab 05/19/24 0336 05/20/24 0510 05/21/24 0542 05/22/24 0541 05/23/24 0930  WBC 17.8* 20.5* 17.9* 20.3* 16.4*  HGB 13.1 11.9* 10.4* 10.6* 11.2*  HCT 40.4 35.1* 32.1* 32.1* 34.2*  MCV 82.8 80.1 82.5 81.5 82.6  PLT 210 178 162 203 246   Basic Metabolic Panel: Recent Labs  Lab 05/19/24 0336 05/20/24 0510 05/21/24 0542 05/22/24 0541  NA 136 134* 135 137  K 4.7 4.2 3.5 4.0  CL 99 102 103 102  CO2 25 25 25 22   GLUCOSE 216* 194* 142* 167*  BUN 20 14 15 23   CREATININE 1.09 0.92 0.92 1.12  CALCIUM  8.8* 7.9* 7.6* 7.9*  MG  --   --  2.1  --   PHOS  --   --  1.8* 2.6   Liver Function Tests: Recent Labs  Lab 05/19/24 0336 05/20/24 0510 05/21/24 0542 05/22/24 0541  AST 24 28 16   --   ALT 24 29 21   --   ALKPHOS 87 71 58  --   BILITOT 1.1 1.5* 1.0  --   PROT 6.9 5.9* 5.6*  --   ALBUMIN  3.9 3.0* 2.6* 2.8*   CBG: Recent  Labs  Lab 05/22/24 0746 05/22/24 1206 05/22/24 1659 05/22/24 2113 05/23/24 0804  GLUCAP 155* 139* 180* 168* 138*    Discharge time spent: greater than 30 minutes.  This record has been created using Conservation officer, historic buildings. Errors have been sought and corrected,but may not always be located. Such creation errors do not reflect on the standard of care.   Signed: Amaryllis Dare, MD [facility] 05/23/2024

## 2024-05-23 NOTE — TOC Initial Note (Signed)
 Transition of Care [facility]) - Initial/Assessment Note    Patient Details  Name: [name] MRN: [id number] Date of Birth: November 20, 1950  Transition of Care [facility]) CM/SW Contact:    Corean ONEIDA Haddock, RN Phone Number: 05/23/2024, 11:03 AM  Clinical Narrative:                    Admitted for: s/p lap chole Admitted from: home with wife PCP: Gasper   Current home health/prior home health/DME: RW, cane, shower seat  Therapy has recommending HH.  Discussed recs with patient.  He declines at this time.  Notified patient that should he change his mind post discharge to reach out to his PCP.  Wife to transport at discharge      Patient Goals and CMS Choice            Expected Discharge Plan and Services         Expected Discharge Date: 05/23/24                                    Prior Living Arrangements/Services                       Activities of Daily Living   ADL Screening (condition at time of admission) Independently performs ADLs?: Yes (appropriate for developmental age) Is the patient deaf or have difficulty hearing?: No Does the patient have difficulty seeing, even when wearing glasses/contacts?: No Does the patient have difficulty concentrating, remembering, or making decisions?: No  Permission Sought/Granted                  Emotional Assessment              Admission diagnosis:  Acute cholecystitis [K81.0] Cholecystitis [K81.9] Nausea [R11.0] Abdominal pain, unspecified abdominal location [R10.9] Patient Active Problem List   Diagnosis Date Noted   Nausea 05/23/2024   Acute cholecystitis 05/20/2024   Bile duct leak 05/20/2024   Hypertension associated with diabetes (HCC) 04/21/2024   Type 2 diabetes mellitus without complications (HCC) 02/12/2023   Primary localized osteoarthritis of pelvic region and thigh 02/21/2019   Osteoarthritis of knee 12/07/2016   History of adenomatous polyp of colon 09/15/2015   1st degree  AV block 09/14/2015   Sciatica of right side 09/14/2015   Bilateral kidney stones 09/14/2015   Fam hx-ischem heart disease 09/14/2015   History of complete heart block    HLD (hyperlipidemia)    HTN (hypertension)    Coronary artery disease    History of acute inferior wall MI 05/03/2015   S/P CABG x 4 05/03/2015   Benign localized prostatic hyperplasia with lower urinary tract symptoms (LUTS) 04/25/2012   PCP:  [name], MD Pharmacy:   [facility] [address] [place] - [address] [place] [address] Phone: [phone] Fax: [phone]     Social Drivers of Health (SDOH) Social History: SDOH Screenings   Food Insecurity: No Food Insecurity (05/19/2024)  Housing: Low Risk  (05/19/2024)  Transportation Needs: No Transportation Needs (05/19/2024)  Utilities: Not At Risk (05/19/2024)  Alcohol Screen: Low Risk  (02/12/2024)  Depression (PHQ2-9): Low Risk  (04/21/2024)  Financial Resource Strain: Low Risk  (02/12/2024)  Physical Activity: Sufficiently Active (02/12/2024)  Social Connections: Moderately Isolated (05/19/2024)  Stress: No Stress Concern Present (02/12/2024)  Tobacco Use: Low Risk  (05/19/2024)  Health Literacy: Adequate Health  Literacy (02/12/2024)   SDOH Interventions:     Readmission Risk Interventions     No data to display

## 2024-05-23 NOTE — Progress Notes (Signed)
 Mobility Specialist - Progress Note  Pre-mobility:SpO2- 95%  During mobility:  SpO2-92%  Post-mobility:  SPO2-94%   05/23/24 1044  Mobility  Activity Ambulated with assistance;Stood at bedside;Respositioned in chair  Level of Assistance Independent after set-up  Assistive Device  (IV Stand)  Distance Ambulated (ft) 240 ft  Range of Motion/Exercises All extremities  Activity Response Tolerated well  Mobility visit 1 Mobility  Mobility Specialist Start Time (ACUTE ONLY) 1030  Mobility Specialist Stop Time (ACUTE ONLY) 1040  Mobility Specialist Time Calculation (min) (ACUTE ONLY) 10 min   Pt was supine in bed with HOB elevated and guest in room upon entry. Pt agreed to mobility. Pt was attached to IV. Pt is able to get to EOB independently with no AD. Pt is able to STS independently with no AD. Pt ambulated well with IV stand. Pt agreed to recovery breaks throughout activity to check vitals. Pt O2 remained above 92% throughout activity. Pt HR remained within 77- 80. After activity pt returned to room and in the recliner with needs in reach and guest in room. Physician in the room upon exit.  Clem Rodes Mobility Specialist 05/23/24, 10:51 AM

## 2024-05-23 NOTE — Progress Notes (Signed)
 [facility] SURGICAL PROGRESS NOTE  Hospital Day(s): 3.   Post op day(s): 2 Days Post-Op.   Interval History:  Patient seen and examined No acute events or new complaints overnight.  Patient reports he is doing better Distension improved No fever, chills, nausea, emesis  No labs this AM Drain with 55 ccs out; serosanguinous Soft diet; no issue Passing flatus; + BM  Vital signs in last 24 hours: [min-max] current  Temp:  [97.6 F (36.4 C)-98.6 F (37 C)] 98.4 F (36.9 C) (10/17 0514) Pulse Rate:  [39-70] 64 (10/17 0514) Resp:  [17-18] 17 (10/17 0514) BP: (133-147)/(60-78) 147/73 (10/17 0514) SpO2:  [93 %-96 %] 93 % (10/17 0514)     Height: 5' 6 (167.6 cm) Weight: 87.4 kg BMI (Calculated): 31.11   Intake/Output last 2 shifts:  10/16 0701 - 10/17 0700 In: 50 [IV Piggyback:50] Out: 255 [Urine:200; Drains:55]   Physical Exam:  Constitutional: alert, cooperative and no distress  Respiratory: breathing non-labored at rest  Cardiovascular: regular rate and sinus rhythm  Gastrointestinal: soft, non-tender, and non-distended. Surgical drain in right abdomen; output serosanguinous Integumentary: Laparoscopic incisions are CDI with staples; no erythema   Labs:     Latest Ref Rng & Units 05/22/2024    5:41 AM 05/21/2024    5:42 AM 05/20/2024    5:10 AM  CBC  WBC 4.0 - 10.5 K/uL 20.3  17.9  20.5   Hemoglobin 13.0 - 17.0 g/dL 89.3  89.5  88.0   Hematocrit 39.0 - 52.0 % 32.1  32.1  35.1   Platelets 150 - 400 K/uL 203  162  178       Latest Ref Rng & Units 05/22/2024    5:41 AM 05/21/2024    5:42 AM 05/20/2024    5:10 AM  CMP  Glucose 70 - 99 mg/dL 832  857  805   BUN 8 - 23 mg/dL 23  15  14    Creatinine 0.61 - 1.24 mg/dL 8.87  9.07  9.07   Sodium 135 - 145 mmol/L 137  135  134   Potassium 3.5 - 5.1 mmol/L 4.0  3.5  4.2   Chloride 98 - 111 mmol/L 102  103  102   CO2 22 - 32 mmol/L 22  25  25    Calcium  8.9 - 10.3 mg/dL 7.9  7.6  7.9   Total Protein  6.5 - 8.1 g/dL  5.6  5.9   Total Bilirubin 0.0 - 1.2 mg/dL  1.0  1.5   Alkaline Phos 38 - 126 U/L  58  71   AST 15 - 41 U/L  16  28   ALT 0 - 44 U/L  21  29      Imaging studies: No new pertinent imaging studies   Assessment/Plan:  73 y.o. male 2 Days Post-Op s/p robotic assisted laparoscopic subtotal cholecystectomy for gangrenous cholecystitis s/p ERCP on 10/15               - Okay to continue diet as tolerated   - Appreciate GI assistance; ERCP reviewed; will need repeat ERCP in ~3 months              - Continue IV Abx (Zosyn)             - Continue surgical drain; monitor and record output - he will go home with this              - Monitor abdominal examination; on-going bowel function  -  Pain control prn; antiemetics prn  - Mobilize as tolerated - Further management per primary service; we will follow     - Discharge Planning: Okay for discharge home today from surgical perspective; Will DC with drain. Plan to follow up next week for removal.   All of the above findings and recommendations were discussed with the patient, patient's family (wife at bedside), and the medical team, and all of patient's and family's questions were answered to their expressed satisfaction.  -- Arthea Platt, PA-C [facility] 05/23/2024, 7:39 AM M-F: 7am - 4pm

## 2024-05-24 NOTE — Anesthesia Postprocedure Evaluation (Signed)
 Anesthesia Post Note  Patient: [name]  Procedure(s) Performed: CHOLECYSTECTOMY, ROBOT-ASSISTED, LAPAROSCOPIC (Abdomen) LYSIS, ADHESIONS, LAPAROSCOPIC (Abdomen)  Patient location during evaluation: PACU Anesthesia Type: General Level of consciousness: awake and alert Pain management: pain level controlled Vital Signs Assessment: post-procedure vital signs reviewed and stable Respiratory status: spontaneous breathing, nonlabored ventilation, respiratory function stable and patient connected to nasal cannula oxygen Cardiovascular status: blood pressure returned to baseline and stable Postop Assessment: no apparent nausea or vomiting Anesthetic complications: no   No notable events documented.   Last Vitals:  Vitals:   05/23/24 0514 05/23/24 0803  BP: (!) 147/73 (!) 149/72  Pulse: 64 63  Resp: 17 16  Temp: 36.9 C (!) 36.4 C  SpO2: 93% 95%    Last Pain:  Vitals:   05/23/24 0803  TempSrc: Oral  PainSc:                  Prentice Murphy

## 2024-05-26 ENCOUNTER — Ambulatory Visit: Admitting: Family Medicine

## 2024-05-26 ENCOUNTER — Encounter: Payer: Self-pay | Admitting: Family Medicine

## 2024-05-26 VITALS — BP 133/67 | HR 72 | Temp 97.7°F | Wt 191.1 lb

## 2024-05-26 DIAGNOSIS — K810 Acute cholecystitis: Secondary | ICD-10-CM

## 2024-05-26 DIAGNOSIS — E782 Mixed hyperlipidemia: Secondary | ICD-10-CM

## 2024-05-26 MED ORDER — ICOSAPENT ETHYL 1 G PO CAPS: 2.0000 g | ORAL_CAPSULE | Freq: Two times a day (BID) | ORAL | 3 refills | Status: DC

## 2024-05-26 NOTE — Progress Notes (Signed)
 Established patient visit   Patient: [name]   DOB: 1950/12/17   73 y.o. Male  MRN: [id number] Visit Date: 05/26/2024  Today's healthcare provider: Nancyann Perry, MD   Chief Complaint  Patient presents with   Hospitalization Follow-up    Admit date:0/13/2025 Discharge date:05/23/24 Patient was treated for Acute cholecystitis   Subjective    Discussed the use of AI scribe software for clinical note transcription with the patient, who gave verbal consent to proceed.  History of Present Illness   [name] is a 73 year old male who presents for follow-up after hospitalization for acute cholecystitis and partial cholecystectomy.  He was admitted to the hospital on October 13th for acute cholecystitis and underwent an urgent partial cholecystectomy on the same day. During the hospital stay, he was told there was an obstruction and that his gallbladder was 'getting green', which may have indicated infection. A stent was placed due to inflammation that prevented sewing, and he is scheduled to have the stent removed in three months. He was discharged on October 17th.  Prior to hospitalization, he experienced severe pain that did not resolve, prompting him to seek medical attention. He has experienced similar pain in the past, but it typically resolved on its own. Since returning home, he has not experienced fever, chills, or sweats. He is currently taking amoxicillin  and has about three days remaining on this course.  He has a follow-up appointment with the surgeon scheduled for later this week, where the drain currently in place will be addressed. His white blood cell count was high during his hospital stay.     Last metabolic panel Lab Results  Component Value Date   GLUCOSE 167 (H) 05/22/2024   NA 137 05/22/2024   K 4.0 05/22/2024   CL 102 05/22/2024   CO2 22 05/22/2024   BUN 23 05/22/2024   CREATININE 1.12 05/22/2024   GFRNONAA >60 05/22/2024   CALCIUM  7.9 (L)  05/22/2024   PHOS 2.6 05/22/2024   PROT 5.6 (L) 05/21/2024   ALBUMIN  2.8 (L) 05/22/2024   LABGLOB 1.8 08/17/2023   AGRATIO 1.8 01/20/2022   BILITOT 1.0 05/21/2024   ALKPHOS 58 05/21/2024   AST 16 05/21/2024   ALT 21 05/21/2024   ANIONGAP 13 05/22/2024   Last CBC Lab Results  Component Value Date   WBC 16.4 (H) 05/23/2024   HGB 11.2 (L) 05/23/2024   HCT 34.2 (L) 05/23/2024   MCV 82.6 05/23/2024   MCH 27.1 05/23/2024   RDW 15.6 (H) 05/23/2024   PLT 246 05/23/2024     Medications: Outpatient Medications Prior to Visit  Medication Sig   acetaminophen  (TYLENOL ) 325 MG tablet Take 2 tablets (650 mg total) by mouth every 6 (six) hours as needed for mild pain (pain score 1-3) or fever (or Fever >/= 101).   amoxicillin -clavulanate (AUGMENTIN) 875-125 MG tablet Take 1 tablet by mouth 2 (two) times daily for 6 days.   aspirin  81 MG EC tablet Take 1 tablet (81 mg total) by mouth daily.   atorvastatin  (LIPITOR ) 80 MG tablet Take 1 tablet by mouth once daily   ENSURE PLUS (ENSURE PLUS) LIQD Take 237 mLs by mouth daily.   lisinopril  (ZESTRIL ) 40 MG tablet Take 1 tablet by mouth once daily   metFORMIN  (GLUCOPHAGE -XR) 500 MG 24 hr tablet Take 1 tablet (500 mg total) by mouth daily with breakfast.   metoprolol  succinate (TOPROL  XL) 25 MG 24 hr tablet Take 1 tablet (25 mg  total) by mouth daily.   Multiple Vitamin (MULTIVITAMIN) tablet Take 1 tablet by mouth every evening.    nitroGLYCERIN  (NITROSTAT ) 0.4 MG SL tablet Place 1 tablet (0.4 mg total) under the tongue every 5 (five) minutes as needed for chest pain.   traMADol  (ULTRAM ) 50 MG tablet Take 1 tablet (50 mg total) by mouth every 6 (six) hours as needed.   [DISCONTINUED] icosapent  Ethyl (VASCEPA ) 1 g capsule Take 2 capsules by mouth twice daily   No facility-administered medications prior to visit.   Review of Systems     Objective    BP 133/67 (BP Location: Left Arm, Patient Position: Sitting, Cuff Size: Normal)   Pulse 72    Temp 97.7 F (36.5 C) (Oral)   Wt 191 lb 1.6 oz (86.7 kg)   SpO2 98%   BMI 30.84 kg/m   Physical Exam   General: Appearance:    Mildly obese male in no acute distress  Eyes:    PERRL, conjunctiva/corneas clear, EOM's intact       Lungs:     Clear to auscultation bilaterally, respirations unlabored  Heart:    Normal heart rate. Normal rhythm. No murmurs, rubs, or gallops.    MS:   All extremities are intact.    Neurologic:   Awake, alert, oriented x 3. No apparent focal neurological defect.      Surgical incisions clean, dry, without erythema. Drain I in place.       Assessment & Plan    1. Cholecystitis, acute (Primary) Much improved 7 days s/p partial cholecystectomy and stenting of GB duct.  - CBC with Differential/Platelet - Comprehensive metabolic panel with GFR  2. Mixed hyperlipidemia Refill - icosapent  Ethyl (VASCEPA ) 1 g capsule; Take 2 capsules (2 g total) by mouth 2 (two) times daily.  Dispense: 360 capsule; Refill: 3     Nancyann Perry, MD  [facility] [phone] (phone) [phone] (fax)  [facility]

## 2024-05-27 ENCOUNTER — Ambulatory Visit: Payer: Self-pay | Admitting: Family Medicine

## 2024-05-27 ENCOUNTER — Telehealth: Payer: Self-pay | Admitting: *Deleted

## 2024-05-27 LAB — CBC WITH DIFFERENTIAL/PLATELET
Basophils Absolute: 0.1 x10E3/uL (ref 0.0–0.2)
Basos: 0 %
EOS (ABSOLUTE): 0.3 x10E3/uL (ref 0.0–0.4)
Eos: 2 %
Hematocrit: 37.7 % (ref 37.5–51.0)
Hemoglobin: 12 g/dL — ABNORMAL LOW (ref 13.0–17.7)
Immature Grans (Abs): 0.1 x10E3/uL (ref 0.0–0.1)
Immature Granulocytes: 1 %
Lymphocytes Absolute: 7.8 x10E3/uL — ABNORMAL HIGH (ref 0.7–3.1)
Lymphs: 39 %
MCH: 26.9 pg (ref 26.6–33.0)
MCHC: 31.8 g/dL (ref 31.5–35.7)
MCV: 85 fL (ref 79–97)
Monocytes Absolute: 1.3 x10E3/uL — ABNORMAL HIGH (ref 0.1–0.9)
Monocytes: 7 %
Neutrophils Absolute: 10.2 x10E3/uL — ABNORMAL HIGH (ref 1.4–7.0)
Neutrophils: 51 %
Platelets: 347 x10E3/uL (ref 150–450)
RBC: 4.46 x10E6/uL (ref 4.14–5.80)
RDW: 14.9 % (ref 11.6–15.4)
WBC: 19.8 x10E3/uL — ABNORMAL HIGH (ref 3.4–10.8)

## 2024-05-27 LAB — COMPREHENSIVE METABOLIC PANEL WITH GFR
ALT: 70 IU/L — ABNORMAL HIGH (ref 0–44)
AST: 22 IU/L (ref 0–40)
Albumin: 3.4 g/dL — ABNORMAL LOW (ref 3.8–4.8)
Alkaline Phosphatase: 126 IU/L — ABNORMAL HIGH (ref 47–123)
BUN/Creatinine Ratio: 16 (ref 10–24)
BUN: 15 mg/dL (ref 8–27)
Bilirubin Total: 0.8 mg/dL (ref 0.0–1.2)
CO2: 24 mmol/L (ref 20–29)
Calcium: 9.4 mg/dL (ref 8.6–10.2)
Chloride: 101 mmol/L (ref 96–106)
Creatinine, Ser: 0.92 mg/dL (ref 0.76–1.27)
Globulin, Total: 2.1 g/dL (ref 1.5–4.5)
Glucose: 128 mg/dL — ABNORMAL HIGH (ref 70–99)
Potassium: 5.3 mmol/L — ABNORMAL HIGH (ref 3.5–5.2)
Sodium: 140 mmol/L (ref 134–144)
Total Protein: 5.5 g/dL — ABNORMAL LOW (ref 6.0–8.5)
eGFR: 87 mL/min/1.73 (ref >59)

## 2024-05-27 NOTE — Transitions of Care (Post Inpatient/ED Visit) (Signed)
   05/27/2024  Name: [name] MRN: [id number] DOB: [birth year]  Today's TOC FU Call Status: Today's TOC FU Call Status:: Unsuccessful Call (1st Attempt) Unsuccessful Call (1st Attempt) Date: 05/27/24  Attempted to reach the patient regarding the most recent Inpatient/ED visit.  Follow Up Plan: Additional outreach attempts will be made to reach the patient to complete the Transitions of Care (Post Inpatient/ED visit) call.   Cathlean Headland BSN RN [facility] Care Management Coordinator [email or web address] Direct Dial: [phone]  Fax: [phone] Website: [email or web address]

## 2024-05-28 ENCOUNTER — Telehealth: Payer: Self-pay

## 2024-05-28 NOTE — Transitions of Care (Post Inpatient/ED Visit) (Signed)
 05/28/2024  Name: [name] MRN: [id number] DOB: 1951/02/04  Today's TOC FU Call Status: Today's TOC FU Call Status:: Successful TOC FU Call Completed TOC FU Call Complete Date: 05/28/24 Patient's Name and Date of Birth confirmed.  Transition Care Management Follow-up Telephone Call Date of Discharge: 05/23/24 Discharge Facility: [facility]) Type of Discharge: Inpatient Admission Primary Inpatient Discharge Diagnosis:: Acute cholecystitis How have you been since you were released from the hospital?: Better Any questions or concerns?: No  Items Reviewed: Did you receive and understand the discharge instructions provided?: Yes Medications obtained,verified, and reconciled?: Yes (Medications Reviewed) Any new allergies since your discharge?: No Dietary orders reviewed?: Yes (my appetite is not that good) Type of Diet Ordered:: low sodium heart healthy Do you have support at home?: Yes People in Home [RPT]: spouse Name of Support/Comfort Primary Source: [name]  Medications Reviewed Today: Medications Reviewed Today     Reviewed by Ralph Richerd GRADE, RN (Registered Nurse) on 05/28/24 at 1522  Med List Status: <None>   Medication Order Taking? Sig Documenting Provider Last Dose Status Informant  acetaminophen  (TYLENOL ) 325 MG tablet [id number] Yes Take 2 tablets (650 mg total) by mouth every 6 (six) hours as needed for mild pain (pain score 1-3) or fever (or Fever >/= 101). [name], Sumayya, MD  Active   amoxicillin -clavulanate (AUGMENTIN) 875-125 MG tablet [id number] Yes Take 1 tablet by mouth 2 (two) times daily for 6 days. Ralph Qualia, MD  Active   aspirin  81 MG EC tablet [id number] Yes Take 1 tablet (81 mg total) by mouth daily. Ralph Lonni Ingle, NP  Active Self  atorvastatin  (LIPITOR ) 80 MG tablet [id number] Yes Take 1 tablet by mouth once daily [name], Ralph A, MD  Active Self  ENSURE PLUS (ENSURE PLUS) LIQD [id number] Yes Take 237 mLs  by mouth daily. [provider]  Active Self  icosapent  Ethyl (VASCEPA ) 1 g capsule [id number] Yes Take 2 capsules (2 g total) by mouth 2 (two) times daily. Ralph Ralph BRAVO, MD  Active   lisinopril  (ZESTRIL ) 40 MG tablet [id number] Yes Take 1 tablet by mouth once daily [name], Ralph A, MD  Active Self  metFORMIN  (GLUCOPHAGE -XR) 500 MG 24 hr tablet [id number] Yes Take 1 tablet (500 mg total) by mouth daily with breakfast. Ralph Ralph BRAVO, MD  Active Self  metoprolol  succinate (TOPROL  XL) 25 MG 24 hr tablet [id number] Yes Take 1 tablet (25 mg total) by mouth daily. Kennyth Chew, MD  Active Self  Multiple Vitamin (MULTIVITAMIN) tablet [id number] Yes Take 1 tablet by mouth every evening.  [provider]  Active Self  nitroGLYCERIN  (NITROSTAT ) 0.4 MG SL tablet [id number]  Place 1 tablet (0.4 mg total) under the tongue every 5 (five) minutes as needed for chest pain. Furth, Cadence H, PA-C  Expired 05/26/24 2359 Self  traMADol  (ULTRAM ) 50 MG tablet [id number] Yes Take 1 tablet (50 mg total) by mouth every 6 (six) hours as needed. Ralph Qualia, MD  Active             Home Care and Equipment/Supplies: Were Home Health Services Ordered?: No Any new equipment or medical supplies ordered?: No  Functional Questionnaire: Do you need assistance with bathing/showering or dressing?: No Do you need assistance with meal preparation?: No Do you need assistance with eating?: No Do you have difficulty maintaining continence: No Do you need assistance with getting out of bed/getting out of a chair/moving?: No Do you have difficulty managing or taking your medications?: No  Follow up appointments reviewed: PCP Follow-up appointment confirmed?: Yes Date of PCP follow-up appointment?: 05/26/24 Follow-up Provider: Dr. Nancyann [name] Specialist [name] Follow-up appointment confirmed?: Yes Date of Specialist follow-up appointment?: 05/29/24 Follow-Up Specialty Provider:: [facility] Do  you need transportation to your follow-up appointment?: No Do you understand care options if your condition(s) worsen?: Yes-patient verbalized understanding  Discussed and offered 30 day TOC program.  Patient declines any need for follow up with TOC or CCM programs as offered.  The patient has been provided with contact information for the care management team and has been advised to call with any health -related questions or concerns.  The patient verbalized understanding with current plan of care.    [name], RN, BSN, CCM [name], [facility] RN Care Manager Direct Dial: [phone]

## 2024-05-29 ENCOUNTER — Ambulatory Visit: Admitting: General Surgery

## 2024-05-29 ENCOUNTER — Telehealth: Payer: Self-pay

## 2024-05-29 ENCOUNTER — Encounter: Payer: Self-pay | Admitting: General Surgery

## 2024-05-29 VITALS — BP 154/84 | HR 73 | Ht 66.0 in | Wt 183.0 lb

## 2024-05-29 DIAGNOSIS — Z09 Encounter for follow-up examination after completed treatment for conditions other than malignant neoplasm: Secondary | ICD-10-CM

## 2024-05-29 DIAGNOSIS — K810 Acute cholecystitis: Secondary | ICD-10-CM

## 2024-05-29 NOTE — Progress Notes (Signed)
 Patient returns today status post subtotal cholecystectomy.  This was followed by an ERCP.  He reports doing well.  He completed a course of antibiotics.  He says his appetite is improving and he is eating okay.  He is having bowel function.  He denies any fevers or chills.  His drain is putting out anywhere from 10 to 30 cc/day of serous fluid.  He did have labs with his PCP that showed persistent leukocytosis but he denies any fevers nausea vomiting or chills.  On exam his abdomen is soft, appropriately tender over his incisions.  The drain has serous fluid in the bulb.  There is no surrounding erythema to suggest infection.  Patient status post subtotal cholecystectomy.  He is doing well.  Drain removed in clinic today.  Staples removed from incision and dressed with Steri-Strips.  Pathology consistent with acute on chronic cholecystitis.  Will follow-up as needed.  Continue lifting restrictions of no greater than 10 to 15 pounds for total of 4 weeks.  He may now submerge the wounds in water.

## 2024-05-29 NOTE — Patient Instructions (Signed)

## 2024-05-29 NOTE — Telephone Encounter (Signed)
 Need for 3 month ERCP

## 2024-06-05 NOTE — Telephone Encounter (Signed)
 Left message on voicemail.

## 2024-06-26 DIAGNOSIS — Z961 Presence of intraocular lens: Secondary | ICD-10-CM | POA: Diagnosis not present

## 2024-06-26 DIAGNOSIS — H26493 Other secondary cataract, bilateral: Secondary | ICD-10-CM | POA: Diagnosis not present

## 2024-06-26 DIAGNOSIS — E119 Type 2 diabetes mellitus without complications: Secondary | ICD-10-CM | POA: Diagnosis not present

## 2024-06-26 LAB — OPHTHALMOLOGY REPORT-SCANNED

## 2024-06-26 NOTE — Progress Notes (Signed)
 06/27/2024 FREMAN LAPAGE [id number] [birth year]  Gastroenterology Office Note    Referring Provider: [name], MD Primary Care Physician:  [name], MD  Primary GI Provider: Jinny Carmine, MD    Chief Complaint   Chief Complaint  Patient presents with   New Patient (Initial Visit)    ER f/u abd pain improved since procedure-appetite good-good BM's     History of Present Illness   [name] is a 73 y.o. male with PMHX of recent cholecystectomy and bile leak s/p ERCP with stent placement presenting today for hospital follow-up.   Patient reports overall doing well, nausea and abdominal pain greatly improved since having recent gallbladder removal.  He may experience some mild discomfort occasionally.  States his appetite is good.  He reports having 1-2 watery stools after he came home from the hospital but he is now having normal soft formed bowel movements daily.  He may have looser stools if he eats French fries or other greasy foods. Denies fever, chills, vomiting, melena or hematochezia.  Patient hospitalized for severe pain in the right upper quadrant.  Found to have acute gangrenous cholecystitis, subtotal cholecystectomy on 05/19/2024 that was complicated by bile leak. S/p ERCP with stent placement, will need repeat ERCP in 3 months.   05/21/2024 ERCP  - The major papilla appeared normal.  - A bile leak was found.  - A biliary sphincterotomy was performed.  - One plastic stent was placed into the common bile duct. - Duodenitis seen.    Past Medical History:  Diagnosis Date   Arthritis    CAD (coronary artery disease)    a. CT scan 01/02/2015: extensive coronary atherosclerosis;  b. 04/2015 Inf STEMI/Cath/CABG x 4: LIMA->LAD, VG->Diag, VG->OM2, VG->RPL; c. 09/2015 MV: EF 55-65%, infsept/inf infarct w/o ischemia->Low Risk.   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 04/2015 in setting of inferior STEMI-->resolved.   Difficult intubation    Hematospermia 04/25/2012   Hip pain 02/24/2022   History of kidney stones    Hyperlipidemia    Hypertension    Incomplete emptying of bladder 04/25/2012   Ischemic cardiomyopathy    a. 04/2015 LV gram: EF 35-40%; b. 09/2015 MV: EF 55-65%.   MI (myocardial infarction) (HCC) 04/2015   Postoperative atrial fibrillation (HCC)    a. 04/2015 Post-op CABG AF/AFlutter--> converted with rapid atrial pacing and amio; b. CHA2DS2VASc = 3->Coumadin .   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   Slowing of urinary stream 04/25/2012   Unilateral inguinal hernia without obstruction or gangrene 04/29/2015   Ureterolithiasis 01/12/2015    Past Surgical History:  Procedure Laterality Date   CARDIAC CATHETERIZATION N/A 05/03/2015   Procedure: Left Heart Cath and Coronary Angiography;  Surgeon: Deatrice DELENA Cage, MD;  Location: [facility] INVASIVE CV LAB;  Service: Cardiovascular;  Laterality: N/A;   CATARACT EXTRACTION, BILATERAL     [facility]   CHOLECYSTECTOMY  05/19/24   Badly infection   COLONOSCOPY WITH PROPOFOL  N/A 06/02/2022   Procedure: COLONOSCOPY WITH PROPOFOL ;  Surgeon: Maryruth Ole DASEN, MD;  Location: [facility] ENDOSCOPY;  Service: Endoscopy;  Laterality: N/A;   CORONARY ARTERY BYPASS GRAFT N/A 05/03/2015   Procedure: CORONARY ARTERY BYPASS GRAFTING (CABG) times four using left internal mammary artery and right saphenous vein.;  Surgeon: Elspeth JAYSON Millers, MD;  Location: [facility] OR;  Service: Open Heart Surgery;  Laterality: N/A;   CYSTOSCOPY WITH STENT PLACEMENT Left 09/16/2019   Procedure: CYSTOSCOPY WITH STENT PLACEMENT;  Surgeon: Twylla Glendia BROCKS, MD;  Location: [facility] ORS;  Service: Urology;  Laterality: Left;   CYSTOSCOPY/RETROGRADE/URETEROSCOPY Left 09/16/2019   Procedure: CYSTOSCOPY/RETROGRADE/URETEROSCOPY;  Surgeon: Twylla Glendia BROCKS, MD;  Location: [facility] ORS;  Service: Urology;  Laterality: Left;    CYSTOSCOPY/URETEROSCOPY/HOLMIUM LASER/STENT PLACEMENT Left 10/07/2019   Procedure: CYSTOSCOPY/URETEROSCOPY/HOLMIUM LASER/STENT Exhange;  Surgeon: Twylla Glendia BROCKS, MD;  Location: [facility] ORS;  Service: Urology;  Laterality: Left;   ERCP N/A 05/21/2024   Procedure: ERCP, WITH INTERVENTION IF INDICATED;  Surgeon: Jinny Carmine, MD;  Location: [facility] ENDOSCOPY;  Service: Endoscopy;  Laterality: N/A;   EYE SURGERY     HERNIA REPAIR     ventral hernia repair   JOINT REPLACEMENT     KNEE SURGERY Left    arthroscopic knee surgery   LAPAROSCOPIC LYSIS OF ADHESIONS N/A 05/19/2024   Procedure: LYSIS, ADHESIONS, LAPAROSCOPIC;  Surgeon: Marinda Jayson KIDD, MD;  Location: [facility] ORS;  Service: General;  Laterality: N/A;   NECK SURGERY     Spinal fusion- neck. Dr. Alm Molt of [place] [facility]   right hip replacement Right 02/04/2014   Dr. Cleotilde   TEE WITHOUT CARDIOVERSION N/A 05/03/2015   Procedure: TRANSESOPHAGEAL ECHOCARDIOGRAM (TEE);  Surgeon: Elspeth BROCKS Millers, MD;  Location: [facility] OR;  Service: Open Heart Surgery;  Laterality: N/A;    Current Outpatient Medications  Medication Sig Dispense Refill   acetaminophen  (TYLENOL ) 325 MG tablet Take 2 tablets (650 mg total) by mouth every 6 (six) hours as needed for mild pain (pain score 1-3) or fever (or Fever >/= 101). 100 tablet 0   aspirin  81 MG EC tablet Take 1 tablet (81 mg total) by mouth daily.     atorvastatin  (LIPITOR ) 80 MG tablet Take 1 tablet by mouth once daily 90 tablet 3   ENSURE PLUS (ENSURE PLUS) LIQD Take 237 mLs by mouth daily.     icosapent  Ethyl (VASCEPA ) 1 g capsule Take 2 capsules (2 g total) by mouth 2 (two) times daily. 360 capsule 3   lisinopril  (ZESTRIL ) 40 MG tablet Take 1 tablet by mouth once daily 30 tablet 11   metFORMIN  (GLUCOPHAGE -XR) 500 MG 24 hr tablet Take 1 tablet (500 mg total) by mouth daily with breakfast. 90 tablet 1   metoprolol  succinate (TOPROL  XL) 25 MG 24 hr tablet Take 1 tablet (25 mg total) by  mouth daily. 90 tablet 3   Multiple Vitamin (MULTIVITAMIN) tablet Take 1 tablet by mouth every evening.      nitroGLYCERIN  (NITROSTAT ) 0.4 MG SL tablet Place 1 tablet (0.4 mg total) under the tongue every 5 (five) minutes as needed for chest pain. 25 tablet 3   traMADol  (ULTRAM ) 50 MG tablet Take 1 tablet (50 mg total) by mouth every 6 (six) hours as needed. 20 tablet 0   No current facility-administered medications for this visit.    Allergies as of 06/27/2024 - Review Complete 06/27/2024  Allergen Reaction Noted   Lovastatin Other (See Comments) 09/14/2015   Pravastatin Other (See Comments) 09/14/2015    Family History  Problem Relation Age of Onset   CAD Father    Heart attack Father    Heart Problems Sister        CABG   Heart attack Brother    Heart attack Brother    Heart attack Brother    Heart attack Brother    Heart attack Sister    CAD Maternal  Uncle    CAD Paternal Uncle     Social History   Socioeconomic History   Marital status: Married    Spouse name: Not on file   Number of children: 1   Years of education: Not on file   Highest education level: Associate degree: occupational, scientist, product/process development, or vocational program  Occupational History   Occupation: Naval Architect    Comment: part time  Tobacco Use   Smoking status: Never    Passive exposure: Never   Smokeless tobacco: Never  Vaping Use   Vaping status: Never Used  Substance and Sexual Activity   Alcohol use: Not Currently    Comment: rarely   Drug use: No   Sexual activity: Yes    Birth control/protection: None  Other Topics Concern   Not on file  Social History Narrative   Lives with Information Systems Manager and wife   Social Drivers of Corporate Investment Banker Strain: Low Risk  (02/12/2024)   Overall Financial Resource Strain (CARDIA)    Difficulty of Paying Living Expenses: Not very hard  Food Insecurity: No Food Insecurity (05/19/2024)   Hunger Vital Sign    Worried About Running Out of Food in the  Last Year: Never true    Ran Out of Food in the Last Year: Never true  Transportation Needs: No Transportation Needs (05/19/2024)   PRAPARE - Administrator, Civil Service (Medical): No    Lack of Transportation (Non-Medical): No  Physical Activity: Sufficiently Active (02/12/2024)   Exercise Vital Sign    Days of Exercise per Week: 7 days    Minutes of Exercise per Session: 30 min  Stress: No Stress Concern Present (02/12/2024)   [employer] of Occupational Health - Occupational Stress Questionnaire    Feeling of Stress: Not at all  Social Connections: Moderately Isolated (05/19/2024)   Social Connection and Isolation Panel    Frequency of Communication with Friends and Family: More than three times a week    Frequency of Social Gatherings with Friends and Family: More than three times a week    Attends Religious Services: Never    Database Administrator or Organizations: No    Attends Banker Meetings: Never    Marital Status: Married  Catering Manager Violence: Not At Risk (05/19/2024)   Humiliation, Afraid, Rape, and Kick questionnaire    Fear of Current or Ex-Partner: No    Emotionally Abused: No    Physically Abused: No    Sexually Abused: No     RELEVANT GI HISTORY, IMAGING AND LABS: CBC    Component Value Date/Time   WBC 19.8 (H) 05/26/2024 1019   WBC 16.4 (H) 05/23/2024 0930   RBC 4.46 05/26/2024 1019   RBC 4.14 (L) 05/23/2024 0930   HGB 12.0 (L) 05/26/2024 1019   HCT 37.7 05/26/2024 1019   PLT 347 05/26/2024 1019   MCV 85 05/26/2024 1019   MCV 82 02/05/2014 0724   MCH 26.9 05/26/2024 1019   MCH 27.1 05/23/2024 0930   MCHC 31.8 05/26/2024 1019   MCHC 32.7 05/23/2024 0930   RDW 14.9 05/26/2024 1019   RDW 14.5 02/05/2014 0724   LYMPHSABS 7.8 (H) 05/26/2024 1019   LYMPHSABS 1.4 02/05/2014 0724   MONOABS 0.7 02/06/2015 1728   MONOABS 1.6 (H) 02/05/2014 0724   EOSABS 0.3 05/26/2024 1019   EOSABS 0.0 02/05/2014 0724   BASOSABS 0.1  05/26/2024 1019   BASOSABS 0.1 02/05/2014 0724   Recent Labs  08/17/23 0942 05/19/24 0336 05/20/24 0510 05/21/24 0542 05/22/24 0541 05/23/24 0930 05/26/24 1019  HGB 15.3 13.1 11.9* 10.4* 10.6* 11.2* 12.0*    CMP     Component Value Date/Time   NA 140 05/26/2024 1019   NA 136 02/05/2014 0724   K 5.3 (H) 05/26/2024 1019   K 4.3 02/05/2014 0724   CL 101 05/26/2024 1019   CL 101 02/05/2014 0724   CO2 24 05/26/2024 1019   CO2 26 02/05/2014 0724   GLUCOSE 128 (H) 05/26/2024 1019   GLUCOSE 167 (H) 05/22/2024 0541   GLUCOSE 136 (H) 02/05/2014 0724   BUN 15 05/26/2024 1019   BUN 15 02/05/2014 0724   CREATININE 0.92 05/26/2024 1019   CREATININE 1.06 02/05/2014 0724   CALCIUM  9.4 05/26/2024 1019   CALCIUM  7.9 (L) 02/05/2014 0724   PROT 5.5 (L) 05/26/2024 1019   ALBUMIN  3.4 (L) 05/26/2024 1019   AST 22 05/26/2024 1019   ALT 70 (H) 05/26/2024 1019   ALKPHOS 126 (H) 05/26/2024 1019   BILITOT 0.8 05/26/2024 1019   GFRNONAA >60 05/22/2024 0541   GFRNONAA >60 02/05/2014 0724   GFRAA 56 (L) 09/16/2019 0810   GFRAA >60 02/05/2014 0724      Latest Ref Rng & Units 05/26/2024   10:19 AM 05/22/2024    5:41 AM 05/21/2024    5:42 AM  Hepatic Function  Total Protein 6.0 - 8.5 g/dL 5.5   5.6   Albumin  3.8 - 4.8 g/dL 3.4  2.8  2.6   AST 0 - 40 IU/L 22   16   ALT 0 - 44 IU/L 70   21   Alk Phosphatase 47 - 123 IU/L 126   58   Total Bilirubin 0.0 - 1.2 mg/dL 0.8   1.0       Review of Systems   All systems reviewed and negative except where noted in HPI.    Physical Exam  BP 131/73   Pulse 75   Temp 97.8 F (36.6 C)   Ht 5' 6 (1.676 m)   Wt 185 lb (83.9 kg)   SpO2 97%   BMI 29.86 kg/m  No LMP for male patient. General:   Alert and oriented. Pleasant and cooperative. Well-nourished and well-developed.  In no acute distress Head:  Normocephalic and atraumatic. Eyes:  Without icterus Ears:  Normal auditory acuity. Neck:  Supple; no masses or thyromegaly. Lungs:   Respirations even and unlabored.  Clear throughout to auscultation.   No wheezes, crackles, or rhonchi. No acute distress. Heart:  Regular rate and rhythm; no murmurs, clicks, rubs, or gallops. Abdomen:  Normal bowel sounds.  No bruits.  Soft, non-tender and non-distended without masses, hepatosplenomegaly or hernias noted.  No guarding or rebound tenderness.   Rectal:  Deferred. Msk:  Symmetrical without gross deformities. Normal posture. Extremities:  Without edema. Neurologic:  Alert and  oriented x4;  grossly normal neurologically. Skin:  Intact without significant lesions or rashes. Psych:  Alert and cooperative. Normal mood and affect.   Assessment & Plan   [name] is a 73 y.o. male presenting today following hospitalization for gangrenous cholecystitis now s/p cholecystectomy and ERCP with stent placement.   Recent cholecystectomy.  Patient also had ERCP due to bile leak with stent placement.  - discussed diet following cholecystectomy.  - Will recheck labs today, CBC and CMET - Will get patient scheduled for ERCP with Dr. Jinny in 2 months.   Follow up in 3 months.   Grayce  Celestia, DNP, AGNP-C [facility]

## 2024-06-27 ENCOUNTER — Encounter: Payer: Self-pay | Admitting: Family Medicine

## 2024-06-27 ENCOUNTER — Ambulatory Visit (INDEPENDENT_AMBULATORY_CARE_PROVIDER_SITE_OTHER): Admitting: Family Medicine

## 2024-06-27 VITALS — BP 131/73 | HR 75 | Temp 97.8°F | Ht 66.0 in | Wt 185.0 lb

## 2024-06-27 DIAGNOSIS — Z9049 Acquired absence of other specified parts of digestive tract: Secondary | ICD-10-CM

## 2024-06-27 DIAGNOSIS — Z09 Encounter for follow-up examination after completed treatment for conditions other than malignant neoplasm: Secondary | ICD-10-CM

## 2024-06-27 DIAGNOSIS — K810 Acute cholecystitis: Secondary | ICD-10-CM | POA: Diagnosis not present

## 2024-06-28 LAB — CBC WITH DIFFERENTIAL/PLATELET
Basophils Absolute: 0.1 x10E3/uL (ref 0.0–0.2)
Basos: 1 %
EOS (ABSOLUTE): 0.1 x10E3/uL (ref 0.0–0.4)
Eos: 1 %
Hematocrit: 40.7 % (ref 37.5–51.0)
Hemoglobin: 13 g/dL (ref 13.0–17.7)
Immature Grans (Abs): 0 x10E3/uL (ref 0.0–0.1)
Immature Granulocytes: 0 %
Lymphocytes Absolute: 6.4 x10E3/uL — ABNORMAL HIGH (ref 0.7–3.1)
Lymphs: 49 %
MCH: 26.4 pg — ABNORMAL LOW (ref 26.6–33.0)
MCHC: 31.9 g/dL (ref 31.5–35.7)
MCV: 83 fL (ref 79–97)
Monocytes Absolute: 1 x10E3/uL — ABNORMAL HIGH (ref 0.1–0.9)
Monocytes: 7 %
Neutrophils Absolute: 5.4 x10E3/uL (ref 1.4–7.0)
Neutrophils: 42 %
Platelets: 251 x10E3/uL (ref 150–450)
RBC: 4.93 x10E6/uL (ref 4.14–5.80)
RDW: 15.5 % — ABNORMAL HIGH (ref 11.6–15.4)
WBC: 13 x10E3/uL — ABNORMAL HIGH (ref 3.4–10.8)

## 2024-06-28 LAB — COMPREHENSIVE METABOLIC PANEL WITH GFR
ALT: 22 IU/L (ref 0–44)
AST: 20 IU/L (ref 0–40)
Albumin: 4.1 g/dL (ref 3.8–4.8)
Alkaline Phosphatase: 118 IU/L (ref 47–123)
BUN/Creatinine Ratio: 16 (ref 10–24)
BUN: 16 mg/dL (ref 8–27)
Bilirubin Total: 0.6 mg/dL (ref 0.0–1.2)
CO2: 24 mmol/L (ref 20–29)
Calcium: 9.4 mg/dL (ref 8.6–10.2)
Chloride: 103 mmol/L (ref 96–106)
Creatinine, Ser: 0.97 mg/dL (ref 0.76–1.27)
Globulin, Total: 2.3 g/dL (ref 1.5–4.5)
Glucose: 128 mg/dL — ABNORMAL HIGH (ref 70–99)
Potassium: 5.2 mmol/L (ref 3.5–5.2)
Sodium: 139 mmol/L (ref 134–144)
Total Protein: 6.4 g/dL (ref 6.0–8.5)
eGFR: 81 mL/min/1.73 (ref >59)

## 2024-07-01 ENCOUNTER — Ambulatory Visit: Payer: Self-pay | Admitting: Family Medicine

## 2024-07-02 ENCOUNTER — Telehealth: Payer: Self-pay

## 2024-07-02 ENCOUNTER — Other Ambulatory Visit: Payer: Self-pay

## 2024-07-02 DIAGNOSIS — K838 Other specified diseases of biliary tract: Secondary | ICD-10-CM

## 2024-07-02 NOTE — Telephone Encounter (Signed)
 LVM to return call- also patient needs to speak with [facility] to schedule ERCP.   WBC decreased from 19.8 to 13.0. CMET unremarkable.    Dorothe, please call patient and let him know that his white blood cell count is now down to 13.0, infection is continuing to resolve. Also, his liver enzymes are all now within normal range.  I do not see that he is scheduled for his ERCP, please make sure he follows up with [facility] if he has not already done so.    Thanks   Grayce Bohr, DNP, AGNP-C

## 2024-07-02 NOTE — Telephone Encounter (Signed)
Pt is aware as instructed and expressed understanding 

## 2024-07-02 NOTE — Telephone Encounter (Signed)
   [facility] Pre-operative Risk Assessment    Request for surgical clearance:  What type of surgery is being performed? ERCP   When is this surgery scheduled? August 11, 2024   Are there any medications that need to be held prior to surgery and how long?Metformin  2 days    Practice name and name of physician performing surgery? [facility], Dr Jinny   What is your office phone and fax number? Ph:[phone] Fx: [phone]   Anesthesia type (None, local, MAC, general) ? general   [name] Y 07/02/2024, 8:33 AM  _________________________________________________________________   (provider comments below)

## 2024-07-02 NOTE — Telephone Encounter (Signed)
 Pt scheduled for 08/12/2024 and PPW mailed to pt and released via Mychart   Pt is aware as instructed and expressed understanding

## 2024-07-02 NOTE — Telephone Encounter (Signed)
   Name: [name]  DOB: April 16, 1951  MRN: [id number]  Primary Cardiologist: Deatrice Cage, MD  Preoperative team, please contact this patient and set up a phone call appointment for further preoperative risk assessment. Please obtain consent and complete medication review. Thank you for your help.  I confirm that guidance regarding antiplatelet and oral anticoagulation therapy has been completed and, if necessary, noted below.  Patient's metformin  is prescribed by his PCP, recommendations regarding holding of metformin  will need to come from his PCP.  I also confirmed the patient resides in the state of [place] . As per [facility] Medical Board telemedicine laws, the patient must reside in the state in which the provider is licensed.   Elek Holderness D Alois Colgan, NP 07/02/2024, 8:45 AM [facility]

## 2024-07-02 NOTE — Telephone Encounter (Signed)
Left message to call back and schedule tele pre op appt

## 2024-07-07 ENCOUNTER — Telehealth: Payer: Self-pay | Admitting: Cardiovascular Disease

## 2024-07-07 ENCOUNTER — Telehealth (HOSPITAL_BASED_OUTPATIENT_CLINIC_OR_DEPARTMENT_OTHER): Payer: Self-pay | Admitting: *Deleted

## 2024-07-07 NOTE — Telephone Encounter (Signed)
 Pt has been scheduled tele preop appt 07/28/24. Med rec and consent are done.

## 2024-07-07 NOTE — Telephone Encounter (Signed)
 Pt returning call to schedule appt. Please advice.

## 2024-07-07 NOTE — Telephone Encounter (Signed)
 Kirby, Jada JK   07/07/24  9:28 AM Note Pt returning call to schedule appt. Please advice.        07/07/24  9:27 AM Delana, Francis NOVAK contacted Kirby, Jada

## 2024-07-07 NOTE — Telephone Encounter (Signed)
 Pt has been scheduled tele preop appt 07/28/24. Med rec and consent are done.      Patient Consent for Virtual Visit        [name] has provided verbal consent on 07/07/2024 for a virtual visit (video or telephone).   CONSENT FOR VIRTUAL VISIT FOR:  [name]  By participating in this virtual visit I agree to the following:  I hereby voluntarily request, consent and authorize [facility] and its employed or contracted physicians, physician assistants, nurse practitioners or other licensed health care professionals (the Practitioner), to provide me with telemedicine health care services (the "Services) as deemed necessary by the treating Practitioner. I acknowledge and consent to receive the Services by the Practitioner via telemedicine. I understand that the telemedicine visit will involve communicating with the Practitioner through live audiovisual communication technology and the disclosure of certain medical information by electronic transmission. I acknowledge that I have been given the opportunity to request an in-person assessment or other available alternative prior to the telemedicine visit and am voluntarily participating in the telemedicine visit.  I understand that I have the right to withhold or withdraw my consent to the use of telemedicine in the course of my care at any time, without affecting my right to future care or treatment, and that the Practitioner or I may terminate the telemedicine visit at any time. I understand that I have the right to inspect all information obtained and/or recorded in the course of the telemedicine visit and may receive copies of available information for a reasonable fee.  I understand that some of the potential risks of receiving the Services via telemedicine include:  Delay or interruption in medical evaluation due to technological equipment failure or disruption; Information transmitted may not be sufficient (e.g. poor  resolution of images) to allow for appropriate medical decision making by the Practitioner; and/or  In rare instances, security protocols could fail, causing a breach of personal health information.  Furthermore, I acknowledge that it is my responsibility to provide information about my medical history, conditions and care that is complete and accurate to the best of my ability. I acknowledge that Practitioner's advice, recommendations, and/or decision may be based on factors not within their control, such as incomplete or inaccurate data provided by me or distortions of diagnostic images or specimens that may result from electronic transmissions. I understand that the practice of medicine is not an exact science and that Practitioner makes no warranties or guarantees regarding treatment outcomes. I acknowledge that a copy of this consent can be made available to me via my patient portal [facility] MyChart), or I can request a printed copy by calling the office of [facility].    I understand that my insurance will be billed for this visit.   I have read or had this consent read to me. I understand the contents of this consent, which adequately explains the benefits and risks of the Services being provided via telemedicine.  I have been provided ample opportunity to ask questions regarding this consent and the Services and have had my questions answered to my satisfaction. I give my informed consent for the services to be provided through the use of telemedicine in my medical care

## 2024-07-28 ENCOUNTER — Ambulatory Visit: Attending: Cardiology

## 2024-07-28 DIAGNOSIS — Z01810 Encounter for preprocedural cardiovascular examination: Secondary | ICD-10-CM

## 2024-07-28 NOTE — Progress Notes (Signed)
 "   Virtual Visit via Telephone Note   Because of [name] co-morbid illnesses, he is at least at moderate risk for complications without adequate follow up.  This format is felt to be most appropriate for this patient at this time.  Due to technical limitations with video connection (technology), today's appointment will be conducted as an audio only telehealth visit, and [name] verbally agreed to proceed in this manner.   All issues noted in this document were discussed and addressed.  No physical exam could be performed with this format.  Evaluation Performed:  Preoperative cardiovascular risk assessment _____________   Date:  07/28/2024   Patient ID:  [name], DOB 04/04/1951, MRN [id number] Patient Location:  Home Provider location:   Office  Primary Care Provider:  [name], MD Primary Cardiologist:  Deatrice Cage, MD  Chief Complaint / Patient Profile   73 y.o. y/o male with a h/o CAD status post CABG September 2016, mixed hyperlipidemia, and ischemic cardiomyopathy who is pending ERCP on 08/11/2024 and presents today for telephonic preoperative cardiovascular risk assessment.  History of Present Illness    [name] is a 73 y.o. male who presents via audio/video conferencing for a telehealth visit today.  Pt was last seen in cardiology clinic on 10/16/2023 by Dr. Kennyth.  At that time [name] was doing well.  The patient is now pending procedure as outlined above. Since his last visit, He denies chest pain, palpitations, dyspnea, orthopnea, n, v,  dark/tarry/bloody stools, hematuria, dizziness, syncope, edema, weight gain. He reports weight loss of 10 pounds since his lap-chole in October.   Past Medical History    Past Medical History:  Diagnosis Date   Arthritis    CAD (coronary artery disease)    a. CT scan 01/02/2015: extensive coronary atherosclerosis;  b. 04/2015 Inf STEMI/Cath/CABG x 4: LIMA->LAD, VG->Diag, VG->OM2, VG->RPL; c.  09/2015 MV: EF 55-65%, infsept/inf infarct w/o ischemia->Low Risk.   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 04/2015 in setting of inferior STEMI-->resolved.   Difficult intubation    Hematospermia 04/25/2012   Hip pain 02/24/2022   History of kidney stones    Hyperlipidemia    Hypertension    Incomplete emptying of bladder 04/25/2012   Ischemic cardiomyopathy    a. 04/2015 LV gram: EF 35-40%; b. 09/2015 MV: EF 55-65%.   MI (myocardial infarction) (HCC) 04/2015   Postoperative atrial fibrillation (HCC)    a. 04/2015 Post-op CABG AF/AFlutter--> converted with rapid atrial pacing and amio; b. CHA2DS2VASc = 3->Coumadin .   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   Slowing of urinary stream 04/25/2012   Unilateral inguinal hernia without obstruction or gangrene 04/29/2015   Ureterolithiasis 01/12/2015   Past Surgical History:  Procedure Laterality Date   CARDIAC CATHETERIZATION N/A 05/03/2015   Procedure: Left Heart Cath and Coronary Angiography;  Surgeon: Deatrice DELENA Cage, MD;  Location: [facility] INVASIVE CV LAB;  Service: Cardiovascular;  Laterality: N/A;   CATARACT EXTRACTION, BILATERAL     [facility]   CHOLECYSTECTOMY  05/19/24   Badly infection   COLONOSCOPY WITH PROPOFOL  N/A 06/02/2022   Procedure: COLONOSCOPY WITH PROPOFOL ;  Surgeon: Maryruth Ole DASEN, MD;  Location: [facility] ENDOSCOPY;  Service: Endoscopy;  Laterality: N/A;   CORONARY ARTERY BYPASS GRAFT N/A 05/03/2015   Procedure: CORONARY ARTERY BYPASS GRAFTING (CABG) times four  using left internal mammary artery and right saphenous vein.;  Surgeon: Elspeth JAYSON Millers, MD;  Location: [facility] OR;  Service: Open Heart Surgery;  Laterality: N/A;   CYSTOSCOPY WITH STENT PLACEMENT Left 09/16/2019   Procedure: CYSTOSCOPY WITH STENT PLACEMENT;  Surgeon: Twylla Glendia JAYSON, MD;  Location: [facility] ORS;  Service: Urology;  Laterality: Left;    CYSTOSCOPY/RETROGRADE/URETEROSCOPY Left 09/16/2019   Procedure: CYSTOSCOPY/RETROGRADE/URETEROSCOPY;  Surgeon: Twylla Glendia JAYSON, MD;  Location: [facility] ORS;  Service: Urology;  Laterality: Left;   CYSTOSCOPY/URETEROSCOPY/HOLMIUM LASER/STENT PLACEMENT Left 10/07/2019   Procedure: CYSTOSCOPY/URETEROSCOPY/HOLMIUM LASER/STENT Exhange;  Surgeon: Twylla Glendia JAYSON, MD;  Location: [facility] ORS;  Service: Urology;  Laterality: Left;   ERCP N/A 05/21/2024   Procedure: ERCP, WITH INTERVENTION IF INDICATED;  Surgeon: Jinny Carmine, MD;  Location: [facility] ENDOSCOPY;  Service: Endoscopy;  Laterality: N/A;   EYE SURGERY     HERNIA REPAIR     ventral hernia repair   JOINT REPLACEMENT     KNEE SURGERY Left    arthroscopic knee surgery   LAPAROSCOPIC LYSIS OF ADHESIONS N/A 05/19/2024   Procedure: LYSIS, ADHESIONS, LAPAROSCOPIC;  Surgeon: Marinda Jayson KIDD, MD;  Location: [facility] ORS;  Service: General;  Laterality: N/A;   NECK SURGERY     Spinal fusion- neck. Dr. Alm Molt of [place] ([facility])   right hip replacement Right 02/04/2014   Dr. Cleotilde   TEE WITHOUT CARDIOVERSION N/A 05/03/2015   Procedure: TRANSESOPHAGEAL ECHOCARDIOGRAM (TEE);  Surgeon: Elspeth JAYSON Millers, MD;  Location: [facility] OR;  Service: Open Heart Surgery;  Laterality: N/A;    Allergies  Allergies[1]  Home Medications    Prior to Admission medications  Medication Sig Start Date End Date Taking? Authorizing Provider  acetaminophen  (TYLENOL ) 325 MG tablet Take 2 tablets (650 mg total) by mouth every 6 (six) hours as needed for mild pain (pain score 1-3) or fever (or Fever >/= 101). 05/23/24   Caleen Qualia, MD  aspirin  81 MG EC tablet Take 1 tablet (81 mg total) by mouth daily. 05/17/15   Vivienne Lonni Ingle, NP  atorvastatin  (LIPITOR ) 80 MG tablet Take 1 tablet by mouth once daily 09/26/23   Darron Deatrice LABOR, MD  ENSURE PLUS (ENSURE PLUS) LIQD Take 237 mLs by mouth daily.    [provider]  icosapent  Ethyl (VASCEPA ) 1  g capsule Take 2 capsules (2 g total) by mouth 2 (two) times daily. 05/26/24   [name], MD  lisinopril  (ZESTRIL ) 40 MG tablet Take 1 tablet by mouth once daily 08/27/23   Arida, Muhammad A, MD  metFORMIN  (GLUCOPHAGE -XR) 500 MG 24 hr tablet Take 1 tablet (500 mg total) by mouth daily with breakfast. 04/21/24   [name], MD  metoprolol  succinate (TOPROL  XL) 25 MG 24 hr tablet Take 1 tablet (25 mg total) by mouth daily. 10/16/23   Kennyth Chew, MD  Multiple Vitamin (MULTIVITAMIN) tablet Take 1 tablet by mouth every evening.     [provider]  nitroGLYCERIN  (NITROSTAT ) 0.4 MG SL tablet Place 1 tablet (0.4 mg total) under the tongue every 5 (five) minutes as needed for chest pain. 08/22/23 07/07/24  Furth, Cadence H, PA-C  traMADol  (ULTRAM ) 50 MG tablet Take 1 tablet (50 mg total) by mouth every 6 (six) hours as needed. 05/23/24 05/23/25  Caleen Qualia, MD    Physical Exam    Vital Signs:  [name] does not have vital signs available for review today.  Given telephonic nature of communication, physical exam is limited. AAOx3. NAD. Normal  affect.  Speech and respirations are unlabored.  Accessory Clinical Findings    None  Assessment & Plan    1.  Preoperative Cardiovascular Risk Assessment: According to the Revised Cardiac Risk Index (RCRI), his Perioperative Risk of Major Cardiac Event is (%): 6.6  His Functional Capacity in METs is: 5.07 according to the Duke Activity Status Index (DASI). Therefore, based on ACC/AHA guidelines, patient would be at moderate but acceptable risk for the planned procedure without further cardiovascular testing. I will route this recommendation to the requesting party via Epic fax function.  The patient was advised that if he develops new symptoms prior to surgery to contact our office to arrange for a follow-up visit, and he verbalized understanding.  Regarding ASA therapy, we recommend continuation of ASA throughout the  perioperative period. However, if the surgeon feels that cessation of ASA is required in the perioperative period, it may be stopped 5-7 days prior to surgery with a plan to resume it as soon as felt to be feasible from a surgical standpoint in the post-operative period.   Patient's metformin  is prescribed by his PCP, recommendations regarding holding of metformin  will need to come from his PCP.   A copy of this note will be routed to requesting surgeon.  Time:   Today, I have spent 10 minutes with the patient with telehealth technology discussing medical history, symptoms, and management plan.     Shriyans Kuenzi E Lazaria Schaben, NP  07/28/2024, 8:03 AM     [1]  Allergies Allergen Reactions   Lovastatin Other (See Comments)    Chest pain   Pravastatin Other (See Comments)    Nose bleed   "

## 2024-07-28 NOTE — Telephone Encounter (Signed)
 Patient would be at moderate but acceptable risk for the planned procedure without further cardiovascular testing. I will route this recommendation to the requesting party via Epic fax function.   The patient was advised that if he develops new symptoms prior to surgery to contact our office to arrange for a follow-up visit, and he verbalized understanding.   Regarding ASA therapy, we recommend continuation of ASA throughout the perioperative period. However, if the surgeon feels that cessation of ASA is required in the perioperative period, it may be stopped 5-7 days prior to surgery with a plan to resume it as soon as felt to be feasible from a surgical standpoint in the post-operative period.   Kenzie E Campbell, NP   07/28/2024, 8:03 AM

## 2024-08-11 ENCOUNTER — Encounter: Payer: Self-pay | Admitting: Gastroenterology

## 2024-08-11 ENCOUNTER — Ambulatory Visit: Admitting: Certified Registered"

## 2024-08-11 ENCOUNTER — Encounter
Admission: RE | Disposition: A | Discharge status: Home / Self Care | Payer: Self-pay | Source: Home / Self Care | Attending: Gastroenterology

## 2024-08-11 ENCOUNTER — Ambulatory Visit

## 2024-08-11 ENCOUNTER — Ambulatory Visit
Admission: RE | Admit: 2024-08-11 | Discharge: 2024-08-11 | Disposition: A | Discharge status: Home / Self Care | Attending: Gastroenterology | Admitting: Gastroenterology

## 2024-08-11 DIAGNOSIS — Z79899 Other long term (current) drug therapy: Secondary | ICD-10-CM | POA: Diagnosis not present

## 2024-08-11 DIAGNOSIS — K838 Other specified diseases of biliary tract: Secondary | ICD-10-CM | POA: Diagnosis not present

## 2024-08-11 DIAGNOSIS — Z7982 Long term (current) use of aspirin: Secondary | ICD-10-CM | POA: Diagnosis not present

## 2024-08-11 DIAGNOSIS — Z951 Presence of aortocoronary bypass graft: Secondary | ICD-10-CM | POA: Insufficient documentation

## 2024-08-11 DIAGNOSIS — I2510 Atherosclerotic heart disease of native coronary artery without angina pectoris: Secondary | ICD-10-CM | POA: Diagnosis not present

## 2024-08-11 DIAGNOSIS — Z4589 Encounter for adjustment and management of other implanted devices: Secondary | ICD-10-CM | POA: Diagnosis present

## 2024-08-11 DIAGNOSIS — I110 Hypertensive heart disease with heart failure: Secondary | ICD-10-CM | POA: Insufficient documentation

## 2024-08-11 DIAGNOSIS — Z4659 Encounter for fitting and adjustment of other gastrointestinal appliance and device: Secondary | ICD-10-CM

## 2024-08-11 DIAGNOSIS — I252 Old myocardial infarction: Secondary | ICD-10-CM | POA: Insufficient documentation

## 2024-08-11 DIAGNOSIS — Z8719 Personal history of other diseases of the digestive system: Secondary | ICD-10-CM | POA: Insufficient documentation

## 2024-08-11 DIAGNOSIS — I509 Heart failure, unspecified: Secondary | ICD-10-CM | POA: Insufficient documentation

## 2024-08-11 DIAGNOSIS — Z4689 Encounter for fitting and adjustment of other specified devices: Secondary | ICD-10-CM

## 2024-08-11 HISTORY — PX: ERCP: SHX5425

## 2024-08-11 HISTORY — PX: STENT REMOVAL: SHX6421

## 2024-08-11 SURGERY — ERCP, WITH INTERVENTION IF INDICATED
Anesthesia: General

## 2024-08-11 MED ORDER — FENTANYL CITRATE (PF) 100 MCG/2ML IJ SOLN
INTRAMUSCULAR | Status: AC
  Filled 2024-08-11: qty 2

## 2024-08-11 MED ORDER — LIDOCAINE HCL (CARDIAC) PF 100 MG/5ML IV SOSY
PREFILLED_SYRINGE | INTRAVENOUS | Status: DC | PRN
  Administered 2024-08-11: 100 mg via INTRAVENOUS

## 2024-08-11 MED ORDER — DICLOFENAC SUPPOSITORY 100 MG: 100.0000 mg | Freq: Once | RECTAL | Status: DC

## 2024-08-11 MED ORDER — SUCCINYLCHOLINE CHLORIDE 200 MG/10ML IV SOSY
PREFILLED_SYRINGE | INTRAVENOUS | Status: DC | PRN
  Administered 2024-08-11: 100 mg via INTRAVENOUS

## 2024-08-11 MED ORDER — ONDANSETRON HCL 4 MG/2ML IJ SOLN
INTRAMUSCULAR | Status: AC
  Filled 2024-08-11: qty 2

## 2024-08-11 MED ORDER — ONDANSETRON HCL 4 MG/2ML IJ SOLN
INTRAMUSCULAR | Status: DC | PRN
  Administered 2024-08-11: 4 mg via INTRAVENOUS

## 2024-08-11 MED ORDER — PROPOFOL 10 MG/ML IV BOLUS
INTRAVENOUS | Status: DC | PRN
  Administered 2024-08-11: 150 mg via INTRAVENOUS

## 2024-08-11 MED ORDER — LACTATED RINGERS IV SOLN
INTRAVENOUS | Status: DC
  Administered 2024-08-11: 1000 mL via INTRAVENOUS

## 2024-08-11 MED ORDER — SODIUM CHLORIDE 0.9 % IV SOLN: INTRAVENOUS | Status: DC

## 2024-08-11 MED ORDER — FENTANYL CITRATE (PF) 100 MCG/2ML IJ SOLN
INTRAMUSCULAR | Status: DC | PRN
  Administered 2024-08-11: 50 ug via INTRAVENOUS

## 2024-08-11 MED ORDER — LIDOCAINE HCL (PF) 2 % IJ SOLN
INTRAMUSCULAR | Status: AC
  Filled 2024-08-11: qty 5

## 2024-08-11 MED ORDER — SUCCINYLCHOLINE CHLORIDE 200 MG/10ML IV SOSY
PREFILLED_SYRINGE | INTRAVENOUS | Status: AC
  Filled 2024-08-11: qty 10

## 2024-08-11 MED ORDER — DEXAMETHASONE SOD PHOSPHATE PF 10 MG/ML IJ SOLN
INTRAMUSCULAR | Status: DC | PRN
  Administered 2024-08-11: 8 mg via INTRAVENOUS

## 2024-08-11 NOTE — Op Note (Signed)
 [facility] Gastroenterology Patient Name: [name] Procedure Date: 08/11/2024 10:57 AM MRN: [id number] Account #: [id number] Date of Birth: 1951/02/05 Admit Type: Outpatient Age: 74 Room: [facility] ENDO ROOM 4 Gender: Male Note Status: Finalized Instrument Name: CELINDA [id number] Procedure:             ERCP Indications:           Biliary stent removal s/p bile duct leak Providers:             Rogelia Copping MD, MD Referring MD:          Nancyann BRAVO. Gasper, MD (Referring MD) Medicines:             General Anesthesia Complications:         No immediate complications. Procedure:             Pre-Anesthesia Assessment:                        - Prior to the procedure, a History and Physical was                         performed, and patient medications and allergies were                         reviewed. The patient's tolerance of previous                         anesthesia was also reviewed. The risks and benefits                         of the procedure and the sedation options and risks                         were discussed with the patient. All questions were                         answered, and informed consent was obtained. Prior                         Anticoagulants: The patient has taken no anticoagulant                         or antiplatelet agents. ASA Grade Assessment: II - A                         patient with mild systemic disease. After reviewing                         the risks and benefits, the patient was deemed in                         satisfactory condition to undergo the procedure.                        After obtaining informed consent, the scope was passed                         under direct vision. Throughout the procedure, the  patient's blood pressure, pulse, and oxygen                         saturations were monitored continuously. The                         Duodenoscope was introduced through the mouth, and                          used to inject contrast into and used to inject                         contrast into the bile duct. The ERCP was accomplished                         without difficulty. The patient tolerated the                         procedure well. Findings:      A biliary stent was visible on the scout film. One stent originating in       the common bile duct was emerging from the major papilla. One stent was       removed from the common bile duct using a snare. The bile duct was       deeply cannulated with the short-nosed traction sphincterotome. Contrast       was injected. I personally interpreted the bile duct images. There was       brisk flow of contrast through the ducts. Image quality was excellent.       Contrast extended to the entire biliary tree. The biliary tree was swept       with a 15 mm balloon starting at the bifurcation. Sludge was swept from       the duct. No further leak seen. Impression:            - One stent from the common bile duct was seen in the                         major papilla.                        - One stent was removed from the common bile duct.                        - The biliary tree was swept and sludge was found. Recommendation:        - Discharge patient to home.                        - Resume previous diet.                        - Watch for pancreatitis, bleeding, perforation, and                         cholangitis. Procedure Code(s):     --- Professional ---                        [phone], Endoscopic retrograde cholangiopancreatography                         (  ERCP); with removal of foreign body(s) or stent(s)                         from biliary/pancreatic duct(s)                        43264, Endoscopic retrograde cholangiopancreatography                         (ERCP); with removal of calculi/debris from                         biliary/pancreatic duct(s)                        25671, Endoscopic catheterization of the biliary                          ductal system, radiological supervision and                         interpretation Diagnosis Code(s):     --- Professional ---                        S53.40, Encounter for fitting and adjustment of other                         gastrointestinal appliance and device CPT copyright 2022 American Medical Association. All rights reserved. The codes documented in this report are preliminary and upon coder review may  be revised to meet current compliance requirements. Rogelia Copping MD, MD 08/11/2024 11:45:40 AM This report has been signed electronically. Number of Addenda: 0 Note Initiated On: 08/11/2024 10:57 AM Estimated Blood Loss:  Estimated blood loss: none.      [facility]

## 2024-08-11 NOTE — Anesthesia Postprocedure Evaluation (Signed)
"   Anesthesia Post Note  Patient: [name]  Procedure(s) Performed: ERCP, WITH INTERVENTION IF INDICATED  Anesthesia Type: General Anesthetic complications: no   No notable events documented.   Last Vitals:  Vitals:   08/11/24 1210 08/11/24 1218  BP: (!) 167/80 (!) 160/80  Pulse: 78 79  Resp: 16   Temp:    SpO2: 100% 98%    Last Pain:  Vitals:   08/11/24 1210  TempSrc:   PainSc: 1                  VAN STAVEREN,Danayah Smyre      "

## 2024-08-11 NOTE — Anesthesia Procedure Notes (Signed)
 Procedure Name: Intubation Date/Time: 08/11/2024 11:05 AM  Performed by: Brandy Almarie BROCKS, CRNAPre-anesthesia Checklist: Emergency Drugs available, Suction available, Patient being monitored and Patient identified Patient Re-evaluated:Patient Re-evaluated prior to induction Oxygen Delivery Method: Circle system utilized Preoxygenation: Pre-oxygenation with 100% oxygen Induction Type: IV induction Laryngoscope Size: Glidescope and 4 Grade View: Grade II Tube type: Oral Tube size: 7.0 mm Number of attempts: 2 Airway Equipment and Method: Video-laryngoscopy and Bougie stylet Placement Confirmation: ETT inserted through vocal cords under direct vision, positive ETCO2 and breath sounds checked- equal and bilateral Secured at: 21 cm Tube secured with: Tape Dental Injury: Teeth and Oropharynx as per pre-operative assessment  Difficulty Due To: Difficulty was anticipated and Difficult Airway- due to reduced neck mobility Comments: Grade 2 view with mcgrath,unable to pass tube. Used bougie & mcgrath, passed tube. Limited neck movement. Minor bleeding in back of oropharynx.

## 2024-08-11 NOTE — Transfer of Care (Signed)
 Immediate Anesthesia Transfer of Care Note  Patient: [name]  Procedure(s) Performed: ERCP, WITH INTERVENTION IF INDICATED  Patient Location: PACU  Anesthesia Type:General  Level of Consciousness: awake, alert , and oriented  Airway & Oxygen Therapy: Patient Spontanous Breathing and Patient connected to face mask oxygen  Post-op Assessment: Report given to RN, Post -op Vital signs reviewed and stable, and Patient moving all extremities X 4  Post vital signs: Reviewed and stable  Last Vitals:  Vitals Value Taken Time  BP 177/87   Temp    Pulse 83   Resp 16   SpO2 100     Last Pain:  Vitals:   08/11/24 1037  TempSrc: Temporal  PainSc: 0-No pain         Complications: No notable events documented.

## 2024-08-11 NOTE — Anesthesia Preprocedure Evaluation (Signed)
 "                                  Anesthesia Evaluation  Patient identified by MRN, date of birth, ID band Patient awake    Reviewed: Allergy & Precautions, NPO status , Patient's Chart, lab work & pertinent test results  History of Anesthesia Complications (+) DIFFICULT AIRWAY and history of anesthetic complications  Airway Mallampati: IV  TM Distance: >3 FB Neck ROM: Limited    Dental  (+) Teeth Intact   Pulmonary neg pulmonary ROS   Pulmonary exam normal breath sounds clear to auscultation       Cardiovascular Exercise Tolerance: Good hypertension, Pt. on medications + CAD, + Past MI, + CABG and +CHF  negative cardio ROS Normal cardiovascular exam Rhythm:Regular Rate:Normal     Neuro/Psych negative neurological ROS  negative psych ROS   GI/Hepatic negative GI ROS, Neg liver ROS,,,  Endo/Other  negative endocrine ROSdiabetes, Type 2, Oral Hypoglycemic Agents    Renal/GU      Musculoskeletal  (+) Arthritis ,    Abdominal Normal abdominal exam  (+)   Peds negative pediatric ROS (+)  Hematology negative hematology ROS (+)   Anesthesia Other Findings Past Medical History: No date: Arthritis No date: CAD (coronary artery disease)     Comment:  a. CT scan 01/02/2015: extensive coronary               atherosclerosis;  b. 04/2015 Inf STEMI/Cath/CABG x 4:               LIMA->LAD, VG->Diag, VG->OM2, VG->RPL; c. 09/2015 MV: EF               55-65%, infsept/inf infarct w/o ischemia->Low Risk. No date: Cataract No date: CHF (congestive heart failure) (HCC) 04/25/2012: Chronic prostatitis No date: Colon polyps No date: Complete heart block (HCC)     Comment:  a. 04/2015 in setting of inferior STEMI-->resolved. No date: Difficult intubation 04/25/2012: Hematospermia 02/24/2022: Hip pain No date: History of kidney stones No date: Hyperlipidemia No date: Hypertension 04/25/2012: Incomplete emptying of bladder No date: Ischemic cardiomyopathy      Comment:  a. 04/2015 LV gram: EF 35-40%; b. 09/2015 MV: EF 55-65%. 04/2015: MI (myocardial infarction) (HCC) No date: Postoperative atrial fibrillation (HCC)     Comment:  a. 04/2015 Post-op CABG AF/AFlutter--> converted with               rapid atrial pacing and amio; b. CHA2DS2VASc =               3->Coumadin . 04/2015: S/P CABG x 4 04/25/2012: Slowing of urinary stream 04/29/2015: Unilateral inguinal hernia without obstruction or gangrene 01/12/2015: Ureterolithiasis  Past Surgical History: 05/03/2015: CARDIAC CATHETERIZATION; N/A     Comment:  Procedure: Left Heart Cath and Coronary Angiography;                Surgeon: Deatrice DELENA Cage, MD;  Location: [facility] INVASIVE               CV LAB;  Service: Cardiovascular;  Laterality: N/A; No date: CATARACT EXTRACTION, BILATERAL     Comment:  [facility] 05/19/24: CHOLECYSTECTOMY     Comment:  Badly infection 06/02/2022: COLONOSCOPY WITH PROPOFOL ; N/A     Comment:  Procedure: COLONOSCOPY WITH PROPOFOL ;  Surgeon:               Maryruth Ole DASEN, MD;  Location: [facility] ENDOSCOPY;                Service: Endoscopy;  Laterality: N/A; 05/03/2015: CORONARY ARTERY BYPASS GRAFT; N/A     Comment:  Procedure: CORONARY ARTERY BYPASS GRAFTING (CABG) times               four using left internal mammary artery and right               saphenous vein.;  Surgeon: Elspeth JAYSON Millers, MD;                Location: [facility] OR;  Service: Open Heart Surgery;                Laterality: N/A; 09/16/2019: CYSTOSCOPY WITH STENT PLACEMENT; Left     Comment:  Procedure: CYSTOSCOPY WITH STENT PLACEMENT;  Surgeon:               Twylla Glendia JAYSON, MD;  Location: [facility] ORS;  Service:               Urology;  Laterality: Left; 09/16/2019: CYSTOSCOPY/RETROGRADE/URETEROSCOPY; Left     Comment:  Procedure: CYSTOSCOPY/RETROGRADE/URETEROSCOPY;  Surgeon:              Twylla Glendia JAYSON, MD;  Location: [facility] ORS;  Service:               Urology;  Laterality: Left; 10/07/2019:  CYSTOSCOPY/URETEROSCOPY/HOLMIUM LASER/STENT PLACEMENT;  Left     Comment:  Procedure: CYSTOSCOPY/URETEROSCOPY/HOLMIUM LASER/STENT               Exhange;  Surgeon: Twylla Glendia JAYSON, MD;  Location: [facility]               ORS;  Service: Urology;  Laterality: Left; 05/21/2024: ERCP; N/A     Comment:  Procedure: ERCP, WITH INTERVENTION IF INDICATED;                Surgeon: Jinny Carmine, MD;  Location: [facility] ENDOSCOPY;                Service: Endoscopy;  Laterality: N/A; No date: EYE SURGERY No date: HERNIA REPAIR     Comment:  ventral hernia repair No date: JOINT REPLACEMENT No date: KNEE SURGERY; Left     Comment:  arthroscopic knee surgery 05/19/2024: LAPAROSCOPIC LYSIS OF ADHESIONS; N/A     Comment:  Procedure: LYSIS, ADHESIONS, LAPAROSCOPIC;  Surgeon:               Marinda Jayson KIDD, MD;  Location: [facility] ORS;  Service:               General;  Laterality: N/A; No date: NECK SURGERY     Comment:  Spinal fusion- neck. Dr. Alm Molt of [place]               ([facility]) 02/04/2014: right hip replacement; Right     Comment:  Dr. Cleotilde 05/03/2015: TEE WITHOUT CARDIOVERSION; N/A     Comment:  Procedure: TRANSESOPHAGEAL ECHOCARDIOGRAM (TEE);                Surgeon: Elspeth JAYSON Millers, MD;  Location: [facility] OR;                Service: Open Heart Surgery;  Laterality: N/A;  BMI    Body Mass Index: 29.57 kg/m      Reproductive/Obstetrics negative OB ROS  Anesthesia Physical Anesthesia Plan  ASA: 3  Anesthesia Plan: General   Post-op Pain Management:    Induction:   PONV Risk Score and Plan: Ondansetron , Dexamethasone , Midazolam  and Treatment may vary due to age or medical condition  Airway Management Planned: Oral ETT  Additional Equipment:   Intra-op Plan:   Post-operative Plan: Extubation in OR  Informed Consent: I have reviewed the patients History and Physical, chart, labs and discussed the procedure  including the risks, benefits and alternatives for the proposed anesthesia with the patient or authorized representative who has indicated his/her understanding and acceptance.     Dental Advisory Given  Plan Discussed with: CRNA and Surgeon  Anesthesia Plan Comments:          Anesthesia Quick Evaluation  "

## 2024-08-11 NOTE — H&P (Signed)
 "  Rogelia Copping, MD [facility] [address], [address] [place], [place] [address] [phone] Fax : [phone]  Primary Care Physician:  [name], MD Primary Gastroenterologist:  Dr. Copping  Pre-Procedure History & Physical: HPI:  [name] is a 74 y.o. male is here for an ERCP.   Past Medical History:  Diagnosis Date   Arthritis    CAD (coronary artery disease)    a. CT scan 01/02/2015: extensive coronary atherosclerosis;  b. 04/2015 Inf STEMI/Cath/CABG x 4: LIMA->LAD, VG->Diag, VG->OM2, VG->RPL; c. 09/2015 MV: EF 55-65%, infsept/inf infarct w/o ischemia->Low Risk.   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 04/2015 in setting of inferior STEMI-->resolved.   Difficult intubation    Hematospermia 04/25/2012   Hip pain 02/24/2022   History of kidney stones    Hyperlipidemia    Hypertension    Incomplete emptying of bladder 04/25/2012   Ischemic cardiomyopathy    a. 04/2015 LV gram: EF 35-40%; b. 09/2015 MV: EF 55-65%.   MI (myocardial infarction) (HCC) 04/2015   Postoperative atrial fibrillation (HCC)    a. 04/2015 Post-op CABG AF/AFlutter--> converted with rapid atrial pacing and amio; b. CHA2DS2VASc = 3->Coumadin .   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   Slowing of urinary stream 04/25/2012   Unilateral inguinal hernia without obstruction or gangrene 04/29/2015   Ureterolithiasis 01/12/2015    Past Surgical History:  Procedure Laterality Date   CARDIAC CATHETERIZATION N/A 05/03/2015   Procedure: Left Heart Cath and Coronary Angiography;  Surgeon: Deatrice DELENA Cage, MD;  Location: [facility] INVASIVE CV LAB;  Service: Cardiovascular;  Laterality: N/A;   CATARACT EXTRACTION, BILATERAL     [facility]   CHOLECYSTECTOMY  05/19/24   Badly infection   COLONOSCOPY WITH PROPOFOL  N/A 06/02/2022   Procedure: COLONOSCOPY WITH PROPOFOL ;  Surgeon: Maryruth Ole DASEN, MD;  Location: [facility]  ENDOSCOPY;  Service: Endoscopy;  Laterality: N/A;   CORONARY ARTERY BYPASS GRAFT N/A 05/03/2015   Procedure: CORONARY ARTERY BYPASS GRAFTING (CABG) times four using left internal mammary artery and right saphenous vein.;  Surgeon: Elspeth JAYSON Millers, MD;  Location: [facility] OR;  Service: Open Heart Surgery;  Laterality: N/A;   CYSTOSCOPY WITH STENT PLACEMENT Left 09/16/2019   Procedure: CYSTOSCOPY WITH STENT PLACEMENT;  Surgeon: Twylla Glendia JAYSON, MD;  Location: [facility] ORS;  Service: Urology;  Laterality: Left;   CYSTOSCOPY/RETROGRADE/URETEROSCOPY Left 09/16/2019   Procedure: CYSTOSCOPY/RETROGRADE/URETEROSCOPY;  Surgeon: Twylla Glendia JAYSON, MD;  Location: [facility] ORS;  Service: Urology;  Laterality: Left;   CYSTOSCOPY/URETEROSCOPY/HOLMIUM LASER/STENT PLACEMENT Left 10/07/2019   Procedure: CYSTOSCOPY/URETEROSCOPY/HOLMIUM LASER/STENT Exhange;  Surgeon: Twylla Glendia JAYSON, MD;  Location: [facility] ORS;  Service: Urology;  Laterality: Left;   ERCP N/A 05/21/2024   Procedure: ERCP, WITH INTERVENTION IF INDICATED;  Surgeon: Copping Rogelia, MD;  Location: [facility] ENDOSCOPY;  Service: Endoscopy;  Laterality: N/A;   EYE SURGERY     HERNIA REPAIR     ventral hernia repair   JOINT REPLACEMENT     KNEE SURGERY Left    arthroscopic knee surgery   LAPAROSCOPIC LYSIS OF ADHESIONS N/A 05/19/2024   Procedure: LYSIS, ADHESIONS, LAPAROSCOPIC;  Surgeon: Marinda Jayson KIDD, MD;  Location: [facility] ORS;  Service: General;  Laterality: N/A;   NECK SURGERY     Spinal fusion- neck. Dr. Alm Molt of [place] ([facility])   right hip replacement Right 02/04/2014   Dr. Cleotilde  TEE WITHOUT CARDIOVERSION N/A 05/03/2015   Procedure: TRANSESOPHAGEAL ECHOCARDIOGRAM (TEE);  Surgeon: Elspeth JAYSON Millers, MD;  Location: [facility] OR;  Service: Open Heart Surgery;  Laterality: N/A;    Prior to Admission medications  Medication Sig Start Date End Date Taking? Authorizing Provider  acetaminophen  (TYLENOL ) 325 MG tablet Take 2 tablets (650 mg  total) by mouth every 6 (six) hours as needed for mild pain (pain score 1-3) or fever (or Fever >/= 101). 05/23/24  Yes Caleen Qualia, MD  aspirin  81 MG EC tablet Take 1 tablet (81 mg total) by mouth daily. 05/17/15  Yes Vivienne Lonni Ingle, NP  atorvastatin  (LIPITOR ) 80 MG tablet Take 1 tablet by mouth once daily 09/26/23  Yes Arida, Deatrice LABOR, MD  ENSURE PLUS (ENSURE PLUS) LIQD Take 237 mLs by mouth daily.   Yes [provider]  icosapent  Ethyl (VASCEPA ) 1 g capsule Take 2 capsules (2 g total) by mouth 2 (two) times daily. 05/26/24  Yes [name], MD  lisinopril  (ZESTRIL ) 40 MG tablet Take 1 tablet by mouth once daily 08/27/23  Yes Arida, Muhammad A, MD  metFORMIN  (GLUCOPHAGE -XR) 500 MG 24 hr tablet Take 1 tablet (500 mg total) by mouth daily with breakfast. 04/21/24  Yes [name], MD  metoprolol  succinate (TOPROL  XL) 25 MG 24 hr tablet Take 1 tablet (25 mg total) by mouth daily. 10/16/23  Yes Kennyth Chew, MD  Multiple Vitamin (MULTIVITAMIN) tablet Take 1 tablet by mouth every evening.    Yes [provider]  traMADol  (ULTRAM ) 50 MG tablet Take 1 tablet (50 mg total) by mouth every 6 (six) hours as needed. 05/23/24 05/23/25 Yes Caleen Qualia, MD  nitroGLYCERIN  (NITROSTAT ) 0.4 MG SL tablet Place 1 tablet (0.4 mg total) under the tongue every 5 (five) minutes as needed for chest pain. 08/22/23 07/07/24  Furth, Cadence H, PA-C    Allergies as of 07/02/2024 - Review Complete 06/27/2024  Allergen Reaction Noted   Lovastatin Other (See Comments) 09/14/2015   Pravastatin Other (See Comments) 09/14/2015    Family History  Problem Relation Age of Onset   CAD Father    Heart attack Father    Heart Problems Sister        CABG   Heart attack Brother    Heart attack Brother    Heart attack Brother    Heart attack Brother    Heart attack Sister    CAD Maternal Uncle    CAD Paternal Uncle     Social History   Socioeconomic History   Marital status: Married     Spouse name: Not on file   Number of children: 1   Years of education: Not on file   Highest education level: Associate degree: occupational, scientist, product/process development, or vocational program  Occupational History   Occupation: Naval Architect    Comment: part time  Tobacco Use   Smoking status: Never    Passive exposure: Never   Smokeless tobacco: Never  Vaping Use   Vaping status: Never Used  Substance and Sexual Activity   Alcohol use: Not Currently    Comment: rarely   Drug use: No   Sexual activity: Yes    Birth control/protection: None  Other Topics Concern   Not on file  Social History Narrative   Lives with Information Systems Manager and wife   Social Drivers of Health   Tobacco Use: Low Risk (08/11/2024)   Patient History    Smoking Tobacco Use: Never    Smokeless Tobacco Use: Never  Passive Exposure: Never  Physicist, Medical Strain: Low Risk (02/12/2024)   Overall Financial Resource Strain (CARDIA)    Difficulty of Paying Living Expenses: Not very hard  Food Insecurity: No Food Insecurity (05/19/2024)   Epic    Worried About Programme Researcher, Broadcasting/film/video in the Last Year: Never true    Ran Out of Food in the Last Year: Never true  Transportation Needs: No Transportation Needs (05/19/2024)   Epic    Lack of Transportation (Medical): No    Lack of Transportation (Non-Medical): No  Physical Activity: Sufficiently Active (02/12/2024)   Exercise Vital Sign    Days of Exercise per Week: 7 days    Minutes of Exercise per Session: 30 min  Stress: No Stress Concern Present (02/12/2024)   [employer] of Occupational Health - Occupational Stress Questionnaire    Feeling of Stress: Not at all  Social Connections: Moderately Isolated (05/19/2024)   Social Connection and Isolation Panel    Frequency of Communication with Friends and Family: More than three times a week    Frequency of Social Gatherings with Friends and Family: More than three times a week    Attends Religious Services: Never    Automotive Engineer or Organizations: No    Attends Banker Meetings: Never    Marital Status: Married  Catering Manager Violence: Not At Risk (05/19/2024)   Epic    Fear of Current or Ex-Partner: No    Emotionally Abused: No    Physically Abused: No    Sexually Abused: No  Depression (PHQ2-9): Low Risk (04/21/2024)   Depression (PHQ2-9)    PHQ-2 Score: 1  Alcohol Screen: Low Risk (02/12/2024)   Alcohol Screen    Last Alcohol Screening Score (AUDIT): 1  Housing: Low Risk (05/19/2024)   Epic    Unable to Pay for Housing in the Last Year: No    Number of Times Moved in the Last Year: 0    Homeless in the Last Year: No  Utilities: Not At Risk (05/19/2024)   Epic    Threatened with loss of utilities: No  Health Literacy: Adequate Health Literacy (02/12/2024)   B1300 Health Literacy    Frequency of need for help with medical instructions: Never    Review of Systems: See HPI, otherwise negative ROS  Physical Exam: BP (!) 159/110   Pulse 68   Temp (!) 97 F (36.1 C) (Temporal)   Resp 18   Ht 5' 6 (1.676 m)   Wt 83.1 kg   SpO2 100%   BMI 29.57 kg/m  General:   Alert,  pleasant and cooperative in NAD Head:  Normocephalic and atraumatic. Neck:  Supple; no masses or thyromegaly. Lungs:  Clear throughout to auscultation.    Heart:  Regular rate and rhythm. Abdomen:  Soft, nontender and nondistended. Normal bowel sounds, without guarding, and without rebound.   Neurologic:  Alert and  oriented x4;  grossly normal neurologically.  Impression/Plan: [name] is here for an ERCP to be performed for for stent removal  Risks, benefits, limitations, and alternatives regarding  ERCP have been reviewed with the patient.  Questions have been answered.  All parties agreeable.   Rogelia Copping, MD  08/11/2024, 10:43 AM "

## 2024-08-20 ENCOUNTER — Ambulatory Visit: Attending: Cardiovascular Disease | Admitting: Cardiovascular Disease

## 2024-08-20 ENCOUNTER — Encounter: Payer: Self-pay | Admitting: Cardiovascular Disease

## 2024-08-20 VITALS — BP 140/70 | HR 58 | Ht 66.0 in | Wt 184.4 lb

## 2024-08-20 DIAGNOSIS — I10 Essential (primary) hypertension: Secondary | ICD-10-CM | POA: Diagnosis not present

## 2024-08-20 DIAGNOSIS — E782 Mixed hyperlipidemia: Secondary | ICD-10-CM

## 2024-08-20 DIAGNOSIS — I493 Ventricular premature depolarization: Secondary | ICD-10-CM

## 2024-08-20 DIAGNOSIS — I2510 Atherosclerotic heart disease of native coronary artery without angina pectoris: Secondary | ICD-10-CM

## 2024-08-20 NOTE — Progress Notes (Signed)
 "    Cardiology Office Note   Date:  08/20/2024   ID:  [name], DOB May 28, 1951, MRN [id number]  PCP:  [name], MD  Cardiologist:   Deatrice Cage, MD   Chief Complaint  Patient presents with   Follow-up    12 month f/u no complaints today. Meds reviewed verbally with pt.      History of Present Illness: [name] is a 74 y.o. male who presents for a follow-up visit regarding coronary artery disease status post CABG September 2016. He presented then with inferior ST elevation myocardial infarction. Cardiac catheterization showed severe three-vessel coronary artery disease. The culprit was an occluded distal RCA with heavy thrombus burden. Ejection fraction was 35-40%. Aspiration thrombectomy and balloon angioplasty was performed with restoration of TIMI-3 flow. However, the results were overall suboptimal and thus the patient underwent urgent CABG by Dr. Kerrin.  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  He was seen by EP and was felt to have minimal symptoms related to this.  He was started on Toprol . Echocardiogram in March 2025 showed an EF of 50 to 55% with no significant valvular abnormalities.  He had recent cholecystectomy and bile leak status post ERCP with stent placement which was recently removed. He is doing well now with no chest pain, shortness of breath or palpitations.   Past Medical History:  Diagnosis Date   Arthritis    CAD (coronary artery disease)    a. CT scan 01/02/2015: extensive coronary atherosclerosis;  b. 04/2015 Inf STEMI/Cath/CABG x 4: LIMA->LAD, VG->Diag, VG->OM2, VG->RPL; c. 09/2015 MV: EF 55-65%, infsept/inf infarct w/o ischemia->Low Risk.   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 04/2015 in setting of inferior STEMI-->resolved.   Difficult intubation    Hematospermia 04/25/2012   Hip pain 02/24/2022    History of kidney stones    Hyperlipidemia    Hypertension    Incomplete emptying of bladder 04/25/2012   Ischemic cardiomyopathy    a. 04/2015 LV gram: EF 35-40%; b. 09/2015 MV: EF 55-65%.   MI (myocardial infarction) (HCC) 04/2015   Postoperative atrial fibrillation (HCC)    a. 04/2015 Post-op CABG AF/AFlutter--> converted with rapid atrial pacing and amio; b. CHA2DS2VASc = 3->Coumadin .   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   Slowing of urinary stream 04/25/2012   Unilateral inguinal hernia without obstruction or gangrene 04/29/2015   Ureterolithiasis 01/12/2015    Past Surgical History:  Procedure Laterality Date   CARDIAC CATHETERIZATION N/A 05/03/2015   Procedure: Left Heart Cath and Coronary Angiography;  Surgeon: Deatrice DELENA Cage, MD;  Location: [facility] INVASIVE CV LAB;  Service: Cardiovascular;  Laterality: N/A;   CATARACT EXTRACTION, BILATERAL     [facility]   CHOLECYSTECTOMY  05/19/24   Badly infection   COLONOSCOPY WITH PROPOFOL  N/A 06/02/2022   Procedure: COLONOSCOPY WITH PROPOFOL ;  Surgeon: Maryruth Ole DASEN, MD;  Location: [facility] ENDOSCOPY;  Service: Endoscopy;  Laterality: N/A;   CORONARY ARTERY BYPASS GRAFT N/A 05/03/2015   Procedure: CORONARY ARTERY BYPASS GRAFTING (CABG) times four using left internal mammary artery and right saphenous vein.;  Surgeon: Elspeth JAYSON Kerrin, MD;  Location: [facility] OR;  Service: Open Heart Surgery;  Laterality: N/A;   CYSTOSCOPY WITH STENT PLACEMENT Left 09/16/2019   Procedure: CYSTOSCOPY WITH STENT PLACEMENT;  Surgeon: Twylla Glendia JAYSON, MD;  Location: [facility] ORS;  Service: Urology;  Laterality: Left;   CYSTOSCOPY/RETROGRADE/URETEROSCOPY Left 09/16/2019   Procedure: CYSTOSCOPY/RETROGRADE/URETEROSCOPY;  Surgeon: Twylla Glendia BROCKS, MD;  Location: [facility] ORS;  Service: Urology;  Laterality: Left;   CYSTOSCOPY/URETEROSCOPY/HOLMIUM LASER/STENT PLACEMENT Left 10/07/2019   Procedure: CYSTOSCOPY/URETEROSCOPY/HOLMIUM LASER/STENT Exhange;  Surgeon: Twylla Glendia BROCKS,  MD;  Location: [facility] ORS;  Service: Urology;  Laterality: Left;   ERCP N/A 05/21/2024   Procedure: ERCP, WITH INTERVENTION IF INDICATED;  Surgeon: Jinny Carmine, MD;  Location: [facility] ENDOSCOPY;  Service: Endoscopy;  Laterality: N/A;   ERCP N/A 08/11/2024   Procedure: ERCP, WITH INTERVENTION IF INDICATED;  Surgeon: Jinny Carmine, MD;  Location: [facility] ENDOSCOPY;  Service: Endoscopy;  Laterality: N/A;  STENT REMOVAL TIME CO-ORDINATES WITH C-ARM AVAILABILITY   EYE SURGERY     HERNIA REPAIR     ventral hernia repair   JOINT REPLACEMENT     KNEE SURGERY Left    arthroscopic knee surgery   LAPAROSCOPIC LYSIS OF ADHESIONS N/A 05/19/2024   Procedure: LYSIS, ADHESIONS, LAPAROSCOPIC;  Surgeon: Marinda Jayson KIDD, MD;  Location: [facility] ORS;  Service: General;  Laterality: N/A;   NECK SURGERY     Spinal fusion- neck. Dr. Alm Molt of [place] ([facility])   right hip replacement Right 02/04/2014   Dr. Cleotilde CARLS REMOVAL  08/11/2024   Procedure: STENT REMOVAL;  Surgeon: Jinny Carmine, MD;  Location: [facility] ENDOSCOPY;  Service: Endoscopy;;   TEE WITHOUT CARDIOVERSION N/A 05/03/2015   Procedure: TRANSESOPHAGEAL ECHOCARDIOGRAM (TEE);  Surgeon: Elspeth BROCKS Millers, MD;  Location: [facility] OR;  Service: Open Heart Surgery;  Laterality: N/A;     Current Outpatient Medications  Medication Sig Dispense Refill   acetaminophen  (TYLENOL ) 325 MG tablet Take 2 tablets (650 mg total) by mouth every 6 (six) hours as needed for mild pain (pain score 1-3) or fever (or Fever >/= 101). 100 tablet 0   aspirin  81 MG EC tablet Take 1 tablet (81 mg total) by mouth daily.     atorvastatin  (LIPITOR ) 80 MG tablet Take 1 tablet by mouth once daily 90 tablet 3   ENSURE PLUS (ENSURE PLUS) LIQD Take 237 mLs by mouth daily.     icosapent  Ethyl (VASCEPA ) 1 g capsule Take 2 capsules (2 g total) by mouth 2 (two) times daily. 360 capsule 3   lisinopril  (ZESTRIL ) 40 MG tablet Take 1 tablet by mouth once daily 30 tablet 11   metFORMIN   (GLUCOPHAGE -XR) 500 MG 24 hr tablet Take 1 tablet (500 mg total) by mouth daily with breakfast. 90 tablet 1   metoprolol  succinate (TOPROL  XL) 25 MG 24 hr tablet Take 1 tablet (25 mg total) by mouth daily. 90 tablet 3   Multiple Vitamin (MULTIVITAMIN) tablet Take 1 tablet by mouth every evening.      nitroGLYCERIN  (NITROSTAT ) 0.4 MG SL tablet Place 1 tablet (0.4 mg total) under the tongue every 5 (five) minutes as needed for chest pain. 25 tablet 3   predniSONE (STERAPRED UNI-PAK 48 TAB) 10 MG (48) TBPK tablet Take by mouth as directed.     traMADol  (ULTRAM ) 50 MG tablet Take 1 tablet (50 mg total) by mouth every 6 (six) hours as needed. 20 tablet 0   No current facility-administered medications for this visit.    Allergies:   Lovastatin and Pravastatin    Social History:  The patient  reports that he has never smoked. He has never been exposed to tobacco smoke. He has never used smokeless tobacco. He reports that he does  not currently use alcohol. He reports that he does not use drugs.   Family History:  The patient's family history includes CAD in his father, maternal uncle, and paternal uncle; Heart Problems in his sister; Heart attack in his brother, brother, brother, brother, father, and sister.    ROS:  Please see the history of present illness.   Otherwise, review of systems are positive for none.   All other systems are reviewed and negative.    PHYSICAL EXAM: VS:  BP (!) 140/70 (BP Location: Left Arm, Patient Position: Sitting, Cuff Size: Normal)   Pulse (!) 58   Ht 5' 6 (1.676 m)   Wt 184 lb 6 oz (83.6 kg)   SpO2 98%   BMI 29.76 kg/m  , BMI Body mass index is 29.76 kg/m. GEN: Well nourished, well developed, in no acute distress  HEENT: normal  Neck: no JVD, carotid bruits, or masses Cardiac: RRR; no murmurs, rubs, or gallops,no edema  Respiratory:  clear to auscultation bilaterally, normal work of breathing GI: soft, nontender, nondistended, + BS MS: no deformity or  atrophy  Skin: warm and dry, no rash Neuro:  Strength and sensation are intact Psych: euthymic mood, full affect   EKG:  EKG is ordered today. The ekg ordered today demonstrates: Sinus bradycardia with 1st degree A-V block Possible Inferior infarct , age undetermined       Recent Labs: 05/21/2024: Magnesium  2.1 06/27/2024: ALT 22; BUN 16; Creatinine, Ser 0.97; Hemoglobin 13.0; Platelets 251; Potassium 5.2; Sodium 139    Lipid Panel    Component Value Date/Time   CHOL 145 08/17/2023 0942   TRIG 234 (H) 08/17/2023 0942   HDL 33 (L) 08/17/2023 0942   CHOLHDL 4.4 08/17/2023 0942   CHOLHDL 5.1 05/28/2020 1011   VLDL 48 (H) 05/28/2020 1011   LDLCALC 73 08/17/2023 0942      Wt Readings from Last 3 Encounters:  08/20/24 184 lb 6 oz (83.6 kg)  08/11/24 183 lb 3.2 oz (83.1 kg)  06/27/24 185 lb (83.9 kg)          No data to display            ASSESSMENT AND PLAN:  1.  Coronary artery disease involving native coronary arteries without angina: He is doing extremely well with no anginal symptoms. Continue medical therapy.  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  2.  Mixed hyperlipidemia: Continue treatment with high-dose atorvastatin  and Vascepa .  Most recent lipid profile showed a triglyceride of 234 and an LDL of 73.  3. Essential hypertension: Blood pressure is reasonably controlled on current medications.   4.  Frequent PVCs: No evidence of PVCs by exam or EKG.  The seem to be well-controlled with Toprol .    Disposition:   FU with me in 12 months  Signed,  Deatrice Cage, MD  08/20/2024 9:08 AM    [facility] "

## 2024-08-20 NOTE — Patient Instructions (Signed)

## 2024-08-25 ENCOUNTER — Encounter: Payer: Self-pay | Admitting: Family Medicine

## 2024-08-25 ENCOUNTER — Ambulatory Visit (INDEPENDENT_AMBULATORY_CARE_PROVIDER_SITE_OTHER): Admitting: Family Medicine

## 2024-08-25 VITALS — BP 118/64 | HR 70 | Resp 16 | Ht 69.0 in | Wt 190.0 lb

## 2024-08-25 DIAGNOSIS — E119 Type 2 diabetes mellitus without complications: Secondary | ICD-10-CM

## 2024-08-25 DIAGNOSIS — I2510 Atherosclerotic heart disease of native coronary artery without angina pectoris: Secondary | ICD-10-CM

## 2024-08-25 DIAGNOSIS — E782 Mixed hyperlipidemia: Secondary | ICD-10-CM

## 2024-08-25 DIAGNOSIS — I10 Essential (primary) hypertension: Secondary | ICD-10-CM | POA: Diagnosis not present

## 2024-08-25 DIAGNOSIS — L989 Disorder of the skin and subcutaneous tissue, unspecified: Secondary | ICD-10-CM

## 2024-08-25 DIAGNOSIS — Z0000 Encounter for general adult medical examination without abnormal findings: Secondary | ICD-10-CM

## 2024-08-25 DIAGNOSIS — E1159 Type 2 diabetes mellitus with other circulatory complications: Secondary | ICD-10-CM

## 2024-08-25 DIAGNOSIS — I152 Hypertension secondary to endocrine disorders: Secondary | ICD-10-CM

## 2024-08-25 DIAGNOSIS — Z125 Encounter for screening for malignant neoplasm of prostate: Secondary | ICD-10-CM

## 2024-08-25 DIAGNOSIS — Z7984 Long term (current) use of oral hypoglycemic drugs: Secondary | ICD-10-CM | POA: Diagnosis not present

## 2024-08-25 NOTE — Patient Instructions (Signed)
 SABRA  Please review the attached list of medications and notify my office if there are any errors.   . Please bring all of your medications to every appointment so we can make sure that our medication list is the same as yours.

## 2024-08-25 NOTE — Progress Notes (Signed)
 "    Complete physical exam   Patient: [name]   DOB: May 02, 1951   74 y.o. Male  MRN: [id number] Visit Date: 08/25/2024  Today's healthcare provider: Nancyann Perry, MD   Chief Complaint  Patient presents with   Annual Exam   Subjective    Discussed the use of AI scribe software for clinical note transcription with the patient, who gave verbal consent to proceed.  History of Present Illness   [name] is a 74 year old male who presents for an annual physical exam and follow-up on arthritis and gallbladder issues.  He has ongoing arthritis, particularly affecting his hip. He recently completed a course of prednisone prescribed during an emergency visit. He reports that Dr. Beola told him the pain in his hip is not severe enough to require a hip replacement. He has not used all of his prescribed pain medication and has been trying other methods to manage the pain.  He has a history of gallbladder issues and underwent a procedure where two stents were placed due to severe blockage. The stents were removed approximately three weeks ago. He experiences throat irritation following the procedure, which he attributes to the stents being removed through his throat.  He is currently on medications for cholesterol and blood pressure, which he believes are effective. He does not regularly monitor his blood pressure at home. He reports that his blood pressure readings at home are usually a little higher than what was measured in the office today.  He is on metformin  for diabetes management but does not check his blood sugar at home. No chest pain, heart flutters, or shortness of breath. He recently had a cardiac check-up, which was reported as normal.  He mentions a need to see a dermatologist for skin issues on his back, as his previous dermatologist retired.     Lab Results  Component Value Date   CHOL 145 08/17/2023   HDL 33 (L) 08/17/2023   LDLCALC 73 08/17/2023   TRIG 234  (H) 08/17/2023   CHOLHDL 4.4 08/17/2023   Lab Results  Component Value Date   NA 139 06/27/2024   K 5.2 06/27/2024   CREATININE 0.97 06/27/2024   EGFR 81 06/27/2024   GLUCOSE 128 (H) 06/27/2024   Lab Results  Component Value Date   HGBA1C 8.0 (H) 04/15/2024       Past Medical History:  Diagnosis Date   Arthritis    CAD (coronary artery disease)    a. CT scan 01/02/2015: extensive coronary atherosclerosis;  b. 04/2015 Inf STEMI/Cath/CABG x 4: LIMA->LAD, VG->Diag, VG->OM2, VG->RPL; c. 09/2015 MV: EF 55-65%, infsept/inf infarct w/o ischemia->Low Risk.   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 04/2015 in setting of inferior STEMI-->resolved.   Difficult intubation    Hematospermia 04/25/2012   Hip pain 02/24/2022   History of kidney stones    Hyperlipidemia    Hypertension    Incomplete emptying of bladder 04/25/2012   Ischemic cardiomyopathy    a. 04/2015 LV gram: EF 35-40%; b. 09/2015 MV: EF 55-65%.   MI (myocardial infarction) (HCC) 04/2015   Postoperative atrial fibrillation (HCC)    a. 04/2015 Post-op CABG AF/AFlutter--> converted with rapid atrial pacing and amio; b. CHA2DS2VASc = 3->Coumadin .   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   Slowing of urinary stream 04/25/2012   Unilateral inguinal hernia without  obstruction or gangrene 04/29/2015   Ureterolithiasis 01/12/2015   Past Surgical History:  Procedure Laterality Date   CARDIAC CATHETERIZATION N/A 05/03/2015   Procedure: Left Heart Cath and Coronary Angiography;  Surgeon: Deatrice DELENA Cage, MD;  Location: [facility] INVASIVE CV LAB;  Service: Cardiovascular;  Laterality: N/A;   CATARACT EXTRACTION, BILATERAL     [facility]   CHOLECYSTECTOMY  05/19/24   Badly infection   COLONOSCOPY WITH PROPOFOL  N/A 06/02/2022   Procedure: COLONOSCOPY WITH PROPOFOL ;  Surgeon: Maryruth Ole DASEN, MD;  Location: [facility] ENDOSCOPY;  Service: Endoscopy;   Laterality: N/A;   CORONARY ARTERY BYPASS GRAFT N/A 05/03/2015   Procedure: CORONARY ARTERY BYPASS GRAFTING (CABG) times four using left internal mammary artery and right saphenous vein.;  Surgeon: Elspeth JAYSON Millers, MD;  Location: [facility] OR;  Service: Open Heart Surgery;  Laterality: N/A;   CYSTOSCOPY WITH STENT PLACEMENT Left 09/16/2019   Procedure: CYSTOSCOPY WITH STENT PLACEMENT;  Surgeon: Twylla Glendia JAYSON, MD;  Location: [facility] ORS;  Service: Urology;  Laterality: Left;   CYSTOSCOPY/RETROGRADE/URETEROSCOPY Left 09/16/2019   Procedure: CYSTOSCOPY/RETROGRADE/URETEROSCOPY;  Surgeon: Twylla Glendia JAYSON, MD;  Location: [facility] ORS;  Service: Urology;  Laterality: Left;   CYSTOSCOPY/URETEROSCOPY/HOLMIUM LASER/STENT PLACEMENT Left 10/07/2019   Procedure: CYSTOSCOPY/URETEROSCOPY/HOLMIUM LASER/STENT Exhange;  Surgeon: Twylla Glendia JAYSON, MD;  Location: [facility] ORS;  Service: Urology;  Laterality: Left;   ERCP N/A 05/21/2024   Procedure: ERCP, WITH INTERVENTION IF INDICATED;  Surgeon: Jinny Carmine, MD;  Location: [facility] ENDOSCOPY;  Service: Endoscopy;  Laterality: N/A;   ERCP N/A 08/11/2024   Procedure: ERCP, WITH INTERVENTION IF INDICATED;  Surgeon: Jinny Carmine, MD;  Location: [facility] ENDOSCOPY;  Service: Endoscopy;  Laterality: N/A;  STENT REMOVAL TIME CO-ORDINATES WITH C-ARM AVAILABILITY   EYE SURGERY     HERNIA REPAIR     ventral hernia repair   JOINT REPLACEMENT     KNEE SURGERY Left    arthroscopic knee surgery   LAPAROSCOPIC LYSIS OF ADHESIONS N/A 05/19/2024   Procedure: LYSIS, ADHESIONS, LAPAROSCOPIC;  Surgeon: Marinda Jayson KIDD, MD;  Location: [facility] ORS;  Service: General;  Laterality: N/A;   NECK SURGERY     Spinal fusion- neck. Dr. Alm Molt of [place] ([facility])   right hip replacement Right 02/04/2014   Dr. Cleotilde CARLS REMOVAL  08/11/2024   Procedure: STENT REMOVAL;  Surgeon: Jinny Carmine, MD;  Location: [facility] ENDOSCOPY;  Service: Endoscopy;;   TEE WITHOUT CARDIOVERSION N/A  05/03/2015   Procedure: TRANSESOPHAGEAL ECHOCARDIOGRAM (TEE);  Surgeon: Elspeth JAYSON Millers, MD;  Location: [facility] OR;  Service: Open Heart Surgery;  Laterality: N/A;   Social History   Socioeconomic History   Marital status: Married    Spouse name: Not on file   Number of children: 1   Years of education: Not on file   Highest education level: Associate degree: occupational, scientist, product/process development, or vocational program  Occupational History   Occupation: Naval Architect    Comment: part time  Tobacco Use   Smoking status: Never    Passive exposure: Never   Smokeless tobacco: Never  Vaping Use   Vaping status: Never Used  Substance and Sexual Activity   Alcohol use: Not Currently    Comment: rarely   Drug use: No   Sexual activity: Yes    Birth control/protection: None  Other Topics Concern   Not on file  Social History Narrative   Lives with Information Systems Manager and wife   Social Drivers of Health   Tobacco Use: Low Risk (08/25/2024)   Patient  History    Smoking Tobacco Use: Never    Smokeless Tobacco Use: Never    Passive Exposure: Never  Financial Resource Strain: Low Risk (02/12/2024)   Overall Financial Resource Strain (CARDIA)    Difficulty of Paying Living Expenses: Not very hard  Food Insecurity: No Food Insecurity (05/19/2024)   Epic    Worried About Programme Researcher, Broadcasting/film/video in the Last Year: Never true    Ran Out of Food in the Last Year: Never true  Transportation Needs: No Transportation Needs (05/19/2024)   Epic    Lack of Transportation (Medical): No    Lack of Transportation (Non-Medical): No  Physical Activity: Sufficiently Active (02/12/2024)   Exercise Vital Sign    Days of Exercise per Week: 7 days    Minutes of Exercise per Session: 30 min  Stress: No Stress Concern Present (02/12/2024)   [employer] of Occupational Health - Occupational Stress Questionnaire    Feeling of Stress: Not at all  Social Connections: Moderately Isolated (05/19/2024)   Social Connection and  Isolation Panel    Frequency of Communication with Friends and Family: More than three times a week    Frequency of Social Gatherings with Friends and Family: More than three times a week    Attends Religious Services: Never    Database Administrator or Organizations: No    Attends Banker Meetings: Never    Marital Status: Married  Catering Manager Violence: Not At Risk (05/19/2024)   Epic    Fear of Current or Ex-Partner: No    Emotionally Abused: No    Physically Abused: No    Sexually Abused: No  Depression (PHQ2-9): Low Risk (08/25/2024)   Depression (PHQ2-9)    PHQ-2 Score: 0  Alcohol Screen: Low Risk (02/12/2024)   Alcohol Screen    Last Alcohol Screening Score (AUDIT): 1  Housing: Low Risk (05/19/2024)   Epic    Unable to Pay for Housing in the Last Year: No    Number of Times Moved in the Last Year: 0    Homeless in the Last Year: No  Utilities: Not At Risk (05/19/2024)   Epic    Threatened with loss of utilities: No  Health Literacy: Adequate Health Literacy (02/12/2024)   B1300 Health Literacy    Frequency of need for help with medical instructions: Never   Family Status  Relation Name Status   Father  Deceased at age 68       MI   Mother  Deceased at age 32       Died of unknown causes   Sister  Alive   Brother  Deceased at age 24       MI   Brother  Deceased at age 9       MI   Brother  Deceased   Brother  Deceased   Brother  Alive   Sister  Deceased   Nurse, Mental Health  (Not Specified)   Nutritional Therapist  (Not Specified)  No partnership data on file   Family History  Problem Relation Age of Onset   CAD Father    Heart attack Father    Heart Problems Sister        CABG   Heart attack Brother    Heart attack Brother    Heart attack Brother    Heart attack Brother    Heart attack Sister    CAD Maternal Uncle    CAD Paternal Uncle    Allergies[1]  Patient Care Team: [name], MD as PCP - General (Family Medicine) Darron Deatrice LABOR, MD as  PCP - Cardiology (Cardiology) Kennyth Chew, MD as PCP - Electrophysiology (Cardiology) Cleotilde Barrio, MD as Consulting Physician (Specialist) Twylla Glendia BROCKS, MD (Urology) Ralph Alm BROCKS, MD (Dermatology) [facility] (Optometry) Marinda Jayson KIDD, MD as Consulting Physician (General Surgery) Jinny Carmine, MD as Consulting Physician (Gastroenterology)   Medications: Show/hide medication list[2]  Review of Systems  Constitutional:  Negative for appetite change, chills and fever.  Respiratory:  Negative for chest tightness, shortness of breath and wheezing.   Cardiovascular:  Negative for chest pain and palpitations.  Gastrointestinal:  Negative for abdominal pain, nausea and vomiting.      Objective    BP 118/64 (BP Location: Left Arm, Patient Position: Sitting, Cuff Size: Large)   Pulse 70   Resp 16   Ht 5' 9 (1.753 m)   Wt 190 lb (86.2 kg)   SpO2 100%   BMI 28.06 kg/m    Physical Exam  General Appearance:    Well developed, well nourished male. Alert, cooperative, in no acute distress, appears stated age  Head:    Normocephalic, without obvious abnormality, atraumatic  Eyes:    PERRL, conjunctiva/corneas clear, EOM's intact, fundi    benign, both eyes       Ears:    Normal TM's and external ear canals, both ears  Nose:   Nares normal, septum midline, mucosa normal, no drainage   or sinus tenderness  Throat:   Lips, mucosa, and tongue normal; teeth and gums normal  Neck:   Supple, symmetrical, trachea midline, no adenopathy;       thyroid :  No enlargement/tenderness/nodules; no carotid   bruit or JVD  Back:     Symmetric, no curvature, ROM normal, no CVA tenderness  Lungs:     Clear to auscultation bilaterally, respirations unlabored  Chest wall:    No tenderness or deformity  Heart:    Normal heart rate. Normal rhythm. No murmurs, rubs, or gallops.  S1 and S2 normal  Abdomen:     Soft, non-tender, bowel sounds active all four quadrants,    no masses,  no organomegaly  Genitalia:    deferred  Rectal:    deferred  Extremities:   All extremities are intact. No cyanosis or edema  Pulses:   2+ and symmetric all extremities  Skin:   Skin color, texture, turgor normal, no rashes or lesions. Extensive seborrhea keratoses across back.   Lymph nodes:   Cervical, supraclavicular, and axillary nodes normal  Neurologic:   CNII-XII intact. Normal strength, sensation and reflexes      throughout       Last depression screening scores    08/25/2024    8:51 AM 04/21/2024    8:22 AM 02/12/2024    8:17 AM  PHQ 2/9 Scores  PHQ - 2 Score 0 0 0  PHQ- 9 Score 0 1  0      Data saved with a previous flowsheet row definition   Last fall risk screening    08/25/2024    8:36 AM  Fall Risk   Falls in the past year? 0  Number falls in past yr: 0  Injury with Fall? 0   Last Audit-C alcohol use screening    02/12/2024    8:16 AM  Alcohol Use Disorder Test (AUDIT)  1. How often do you have a drink containing alcohol? 1  2. How many drinks containing  alcohol do you have on a typical day when you are drinking? 0  3. How often do you have six or more drinks on one occasion? 0  AUDIT-C Score 1   A score of 3 or more in women, and 4 or more in men indicates increased risk for alcohol abuse, EXCEPT if all of the points are from question 1   No results found for any visits on 08/25/24.  Assessment & Plan    Routine Health Maintenance and Physical Exam  Exercise Activities and Dietary recommendations  Goals      DIET - INCREASE WATER INTAKE     Exercise 3x per week (30 min per time)     Recommend to exercise for 3 days a week for at least 30 minutes at a time.         Immunization History  Administered Date(s) Administered   Fluad Quad(high Dose 65+) 05/02/2019   INFLUENZA, HIGH DOSE SEASONAL PF 06/02/2017, 04/05/2018   Influenza-Unspecified 04/25/2014, 05/07/2015   PFIZER(Purple Top)SARS-COV-2 Vaccination 05/01/2020   Pneumococcal Conjugate-13  09/15/2015   Pneumococcal Polysaccharide-23 02/23/2017   Tdap 06/19/2008   Zoster, Live 09/15/2015    Health Maintenance  Topic Date Due   Diabetic kidney evaluation - Urine ACR  Never done   Zoster Vaccines- Shingrix  (1 of 2) 06/17/1999   DTaP/Tdap/Td (2 - Td or Tdap) 06/19/2018   COVID-19 Vaccine (2 - 2025-26 season) 04/07/2024   Influenza Vaccine  11/04/2024 (Originally 03/07/2024)   HEMOGLOBIN A1C  10/13/2024   Medicare Annual Wellness (AWV)  02/11/2025   Colonoscopy  06/02/2025   OPHTHALMOLOGY EXAM  06/26/2025   Diabetic kidney evaluation - eGFR measurement  06/27/2025   Pneumococcal Vaccine: 50+ Years  Completed   Hepatitis C Screening  Completed   Meningococcal B Vaccine  Aged Out    Discussed health benefits of physical activity, and encouraged him to engage in regular exercise appropriate for his age and condition.     Osteoarthritis of hip Chronic osteoarthritis, managed with prednisone taper. No current need for hip replacement. - Follow up with urgent care next week as per Dr. Truddie recommendation.  Type 2 diabetes mellitus Managed with metformin . Blood sugar levels may be affected by prednisone taper. - Schedule fasting blood draw next week to assess blood sugar levels post-prednisone taper. - Urine Albumin /Creatinine with ratio (send out) [[id number]]  Primary hypertension associated with type 2 diabetes Medication prescribed. Home monitoring not regular. Recent office reading low, typically higher. - Rechecked blood pressure in office. - Encouraged regular home blood pressure monitoring.  Coronary artery disease Recent cardiology evaluation showed no new issues.  Mixed hyperlipidemia Medication prescribed. Due for cholesterol check. - Ordered cholesterol panel today.      Prostate cancer screening - PSA  Return in about 4 months (around 12/23/2024).        Nancyann Perry, MD  [facility] [phone] (phone) [phone]  (fax)  [facility]    [1]  Allergies Allergen Reactions   Lovastatin Other (See Comments)    Chest pain   Pravastatin Other (See Comments)    Nose bleed  [2]  Outpatient Medications Prior to Visit  Medication Sig   acetaminophen  (TYLENOL ) 325 MG tablet Take 2 tablets (650 mg total) by mouth every 6 (six) hours as needed for mild pain (pain score 1-3) or fever (or Fever >/= 101).   aspirin  81 MG EC tablet Take 1 tablet (81 mg total) by mouth daily.   atorvastatin  (  LIPITOR ) 80 MG tablet Take 1 tablet by mouth once daily   ENSURE PLUS (ENSURE PLUS) LIQD Take 237 mLs by mouth daily.   icosapent  Ethyl (VASCEPA ) 1 g capsule Take 2 capsules (2 g total) by mouth 2 (two) times daily.   lisinopril  (ZESTRIL ) 40 MG tablet Take 1 tablet by mouth once daily   metFORMIN  (GLUCOPHAGE -XR) 500 MG 24 hr tablet Take 1 tablet (500 mg total) by mouth daily with breakfast.   metoprolol  succinate (TOPROL  XL) 25 MG 24 hr tablet Take 1 tablet (25 mg total) by mouth daily.   Multiple Vitamin (MULTIVITAMIN) tablet Take 1 tablet by mouth every evening.    nitroGLYCERIN  (NITROSTAT ) 0.4 MG SL tablet Place 1 tablet (0.4 mg total) under the tongue every 5 (five) minutes as needed for chest pain.   predniSONE (STERAPRED UNI-PAK 48 TAB) 10 MG (48) TBPK tablet Take by mouth as directed.   traMADol  (ULTRAM ) 50 MG tablet Take 1 tablet (50 mg total) by mouth every 6 (six) hours as needed.   No facility-administered medications prior to visit.   "

## 2024-08-29 ENCOUNTER — Telehealth: Payer: Self-pay

## 2024-08-29 DIAGNOSIS — K810 Acute cholecystitis: Secondary | ICD-10-CM

## 2024-08-29 NOTE — Telephone Encounter (Signed)
 Patient is coming up on Hypercholesteremia grant re-enrollment for Vascepa . Current grant expires on 09/14/24.   Will route to medication assistance team for assistance with re-enrollment.   Kearsten Ginther E. Marsh, PharmD, BCACP, CPP Clinical Pharmacist [facility] [phone]

## 2024-09-01 NOTE — Telephone Encounter (Signed)
 I think renewal went through on this one also, just waiting on diagnosis verification.

## 2024-09-02 ENCOUNTER — Other Ambulatory Visit: Payer: Self-pay

## 2024-09-02 DIAGNOSIS — K810 Acute cholecystitis: Secondary | ICD-10-CM

## 2024-09-02 MED ORDER — ICOSAPENT ETHYL 1 G PO CAPS: 2.0000 g | ORAL_CAPSULE | Freq: Two times a day (BID) | ORAL | 3 refills | Status: AC

## 2024-09-02 MED ORDER — ICOSAPENT ETHYL 1 G PO CAPS: 2.0000 g | ORAL_CAPSULE | Freq: Two times a day (BID) | ORAL | 3 refills | Status: DC

## 2024-09-02 NOTE — Telephone Encounter (Signed)
 New Rx sent to pharmacy with billing information. Will also message patient with grant billing information.  Jamire Shabazz E. Marsh, PharmD, BCACP, CPP Clinical Pharmacist [facility] [phone]

## 2024-09-04 ENCOUNTER — Ambulatory Visit: Admitting: Dermatology

## 2024-09-04 ENCOUNTER — Encounter: Payer: Self-pay | Admitting: Dermatology

## 2024-09-04 DIAGNOSIS — Z1283 Encounter for screening for malignant neoplasm of skin: Secondary | ICD-10-CM

## 2024-09-04 DIAGNOSIS — L821 Other seborrheic keratosis: Secondary | ICD-10-CM | POA: Diagnosis not present

## 2024-09-04 DIAGNOSIS — L820 Inflamed seborrheic keratosis: Secondary | ICD-10-CM | POA: Diagnosis not present

## 2024-09-04 DIAGNOSIS — L570 Actinic keratosis: Secondary | ICD-10-CM

## 2024-09-04 DIAGNOSIS — L814 Other melanin hyperpigmentation: Secondary | ICD-10-CM | POA: Diagnosis not present

## 2024-09-04 DIAGNOSIS — D1801 Hemangioma of skin and subcutaneous tissue: Secondary | ICD-10-CM

## 2024-09-04 DIAGNOSIS — L578 Other skin changes due to chronic exposure to nonionizing radiation: Secondary | ICD-10-CM

## 2024-09-04 DIAGNOSIS — D229 Melanocytic nevi, unspecified: Secondary | ICD-10-CM

## 2024-09-04 DIAGNOSIS — W908XXA Exposure to other nonionizing radiation, initial encounter: Secondary | ICD-10-CM

## 2024-09-04 NOTE — Progress Notes (Signed)
 "  New Patient Visit   Subjective  [name] is a 74 y.o. male who presents for the following: Skin Cancer Screening and Upper Body Skin Exam hx of Aks, check spots back, one on back is irritating  New patient referral from Dr. Nancyann Perry.  The patient presents for Upper Body Skin Exam (UBSE) for skin cancer screening and mole check. The patient has spots, moles and lesions to be evaluated, some may be new or changing and the patient may have concern these could be cancer.    The following portions of the chart were reviewed this encounter and updated as appropriate: medications, allergies, medical history  Review of Systems:  No other skin or systemic complaints except as noted in HPI or Assessment and Plan.  Objective  Well appearing patient in no apparent distress; mood and affect are within normal limits.  All skin waist up examined. Relevant physical exam findings are noted in the Assessment and Plan.  R lat forehead x 1, R temple x 2, L mid cheek x 1, L dorsal hand x 4, (8) Pink scaly macules back x 20 (20) Stuck on waxy paps with erythema  Assessment & Plan   AK (ACTINIC KERATOSIS) (8) R lat forehead x 1, R temple x 2, L mid cheek x 1, L dorsal hand x 4, (8) Actinic keratoses are precancerous spots that appear secondary to cumulative UV radiation exposure/sun exposure over time. They are chronic with expected duration over 1 year. A portion of actinic keratoses will progress to squamous cell carcinoma of the skin. It is not possible to reliably predict which spots will progress to skin cancer and so treatment is recommended to prevent development of skin cancer.  Recommend daily broad spectrum sunscreen SPF 30+ to sun-exposed areas, reapply every 2 hours as needed.  Recommend staying in the shade or wearing long sleeves, sun glasses (UVA+UVB protection) and wide brim hats (4-inch brim around the entire circumference of the hat). Call for new or changing lesions. -  Destruction of lesion - R lat forehead x 1, R temple x 2, L mid cheek x 1, L dorsal hand x 4, (8) Complexity: simple   Destruction method: cryotherapy   Informed consent: discussed and consent obtained   Timeout:  patient name, date of birth, surgical site, and procedure verified Lesion destroyed using liquid nitrogen: Yes   Region frozen until ice ball extended beyond lesion: Yes   Cryo cycles: 1 or 2. Outcome: patient tolerated procedure well with no complications   Post-procedure details: wound care instructions given    INFLAMED SEBORRHEIC KERATOSIS (20) back x 20 (20) Symptomatic, irritating, patient would like treated. - Destruction of lesion - back x 20 (20) Complexity: simple   Destruction method: cryotherapy   Informed consent: discussed and consent obtained   Timeout:  patient name, date of birth, surgical site, and procedure verified Lesion destroyed using liquid nitrogen: Yes   Region frozen until ice ball extended beyond lesion: Yes   Cryo cycles: 1 or 2. Outcome: patient tolerated procedure well with no complications   Post-procedure details: wound care instructions given    MULTIPLE BENIGN NEVI   LENTIGINES   ACTINIC ELASTOSIS   SEBORRHEIC KERATOSES   CHERRY ANGIOMA   Skin cancer screening performed today.  Actinic Damage - Chronic condition, secondary to cumulative UV/sun exposure - diffuse scaly erythematous macules with underlying dyspigmentation - Recommend daily broad spectrum sunscreen SPF 30+ to sun-exposed areas, reapply every 2 hours as needed.  -  Staying in the shade or wearing long sleeves, sun glasses (UVA+UVB protection) and wide brim hats (4-inch brim around the entire circumference of the hat) are also recommended for sun protection.  - Call for new or changing lesions.  Lentigines, Seborrheic Keratoses, Hemangiomas - Benign normal skin lesions - Benign-appearing - Call for any changes  Melanocytic Nevi - Tan-brown and/or  pink-flesh-colored symmetric macules and papules - Benign appearing on exam today - Observation - Call clinic for new or changing moles - Recommend daily use of broad spectrum spf 30+ sunscreen to sun-exposed areas.    Return in about 1 year (around 09/04/2025) for UBSE, Hx of AKs.  I, Grayce Saunas, RMA, am acting as scribe for Boneta Sharps, MD .   Documentation: I have reviewed the above documentation for accuracy and completeness, and I agree with the above.  Boneta Sharps, MD    "

## 2024-09-04 NOTE — Patient Instructions (Addendum)
 Recommend daily broad spectrum sunscreen SPF 30+ to sun-exposed areas, reapply every 2 hours as needed. Call for new or changing lesions.  Staying in the shade or wearing long sleeves, sun glasses (UVA+UVB protection) and wide brim hats (4-inch brim around the entire circumference of the hat) are also recommended for sun protection.    Due to recent changes in healthcare laws, you may see results of your pathology and/or laboratory studies on MyChart before the doctors have had a chance to review them. We understand that in some cases there may be results that are confusing or concerning to you. Please understand that not all results are received at the same time and often the doctors may need to interpret multiple results in order to provide you with the best plan of care or course of treatment. Therefore, we ask that you please give us  2 business days to thoroughly review all your results before contacting the office for clarification. Should we see a critical lab result, you will be contacted sooner.   If You Need Anything After Your Visit  If you have any questions or concerns for your doctor, please call our main line at [phone] and press option 4 to reach your doctor's medical assistant. If no one answers, please leave a voicemail as directed and we will return your call as soon as possible. Messages left after 4 pm will be answered the following business day.   You may also send us  a message via MyChart. We typically respond to MyChart messages within 1-2 business days.  For prescription refills, please ask your pharmacy to contact our office. Our fax number is [phone].  If you have an urgent issue when the clinic is closed that cannot wait until the next business day, you can page your doctor at the number below.    Please note that while we do our best to be available for urgent issues outside of office hours, we are not available 24/7.   If you have an urgent issue and are  unable to reach us , you may choose to seek medical care at your doctor's office, retail clinic, urgent care center, or emergency room.  If you have a medical emergency, please immediately call 911 or go to the emergency department.  Pager Numbers  - Dr. Hester: [phone]  - Dr. Jackquline: [phone]  - Dr. Claudene: [phone]   - Dr. Raymund: [phone]  In the event of inclement weather, please call our main line at [phone] for an update on the status of any delays or closures.  Dermatology Medication Tips: Please keep the boxes that topical medications come in in order to help keep track of the instructions about where and how to use these. Pharmacies typically print the medication instructions only on the boxes and not directly on the medication tubes.   If your medication is too expensive, please contact our office at [phone] option 4 or send us  a message through MyChart.   We are unable to tell what your co-pay for medications will be in advance as this is different depending on your insurance coverage. However, we may be able to find a substitute medication at lower cost or fill out paperwork to get insurance to cover a needed medication.   If a prior authorization is required to get your medication covered by your insurance company, please allow us  1-2 business days to complete this process.  Drug prices often vary depending on where the prescription is filled and some pharmacies may offer  cheaper prices.  The website www.goodrx.com contains coupons for medications through different pharmacies. The prices here do not account for what the cost may be with help from insurance (it may be cheaper with your insurance), but the website can give you the price if you did not use any insurance.  - You can print the associated coupon and take it with your prescription to the pharmacy.  - You may also stop by our office during regular business hours and pick up a GoodRx coupon  card.  - If you need your prescription sent electronically to a different pharmacy, notify our office through [facility] or by phone at [phone] option 4.     Si Usted Necesita Algo Despus de Su Visita  Tambin puede enviarnos un mensaje a travs de Clinical cytogeneticist. Por lo general respondemos a los mensajes de MyChart en el transcurso de 1 a 2 das hbiles.  Para renovar recetas, por favor pida a su farmacia que se ponga en contacto con nuestra oficina. Randi lakes de fax es [place] [phone].  Si tiene un asunto urgente cuando la clnica est cerrada y que no puede esperar hasta el siguiente da hbil, puede llamar/localizar a su doctor(a) al nmero que aparece a continuacin.   Por favor, tenga en cuenta que aunque hacemos todo lo posible para estar disponibles para asuntos urgentes fuera del horario de [place], no estamos disponibles las 24 horas del da, los 7 [address]  [address] de la [place].   Si tiene un problema urgente y no puede comunicarse con nosotros, puede optar por buscar atencin mdica  en el consultorio de su doctor(a), en una clnica privada, en un centro de atencin urgente o en una sala de emergencias.  Si tiene Engineer, drilling, por favor llame inmediatamente al 911 o vaya a la sala de emergencias.  Nmeros de bper  - Dr. Hester: [phone]  - Dra. Jackquline: [phone]  - Dr. Claudene: [phone]  - Dra. Kitts: [phone]  En caso de inclemencias del [place], por favor llame a nuestra lnea principal al [phone] para una actualizacin sobre el estado de cualquier retraso o cierre.  Consejos para la medicacin en dermatologa: Por favor, guarde las cajas en las que vienen los medicamentos de uso tpico para ayudarle a seguir las instrucciones sobre dnde y cmo usarlos. Las farmacias generalmente imprimen las instrucciones del medicamento slo en las cajas y no directamente en los tubos del [place].   Si su medicamento es muy caro, por favor, pngase  en contacto con landry rieger llamando al [phone] y presione la opcin 4 o envenos un mensaje a travs de Clinical cytogeneticist.   No podemos decirle cul ser su copago por los medicamentos por adelantado ya que esto es diferente dependiendo de la cobertura de su seguro. Sin embargo, es posible que podamos encontrar un medicamento sustituto a Audiological scientist un formulario para que el seguro cubra el medicamento que se considera necesario.   Si se requiere una autorizacin previa para que su compaa de seguros malta su medicamento, por favor permtanos de 1 a 2 das hbiles para completar este proceso.  Los precios de los medicamentos varan con frecuencia dependiendo del Environmental consultant de dnde se surte la receta y alguna farmacias pueden ofrecer precios ms baratos.  El sitio web www.goodrx.com tiene cupones para medicamentos de Health and safety inspector. Los precios aqu no tienen en cuenta lo que podra costar con la ayuda del seguro (puede ser ms barato con su seguro), pero el sitio web puede darle el  precio si no Visual merchandiser.  - Puede imprimir el cupn correspondiente y llevarlo con su receta a la farmacia.  - Tambin puede pasar por nuestra oficina durante el horario de atencin regular y Education officer, museum una tarjeta de cupones de GoodRx.  - Si necesita que su receta se enve electrnicamente a una farmacia diferente, informe a nuestra oficina a travs de MyChart de [place] o por telfono llamando al [phone] y presione la opcin 4.

## 2024-09-08 ENCOUNTER — Ambulatory Visit: Payer: Self-pay

## 2024-09-08 NOTE — Telephone Encounter (Signed)
 FYI Only or Action Required?: FYI only for provider: appointment scheduled on 2/3.  Patient was last seen in primary care on 08/25/2024 by [name], MD.  Called Nurse Triage reporting No chief complaint on file..  Symptoms began several days ago.  Interventions attempted: Prescription medications: tramadol .  Symptoms are: gradually worsening.  Triage Disposition: See PCP When Office is Open (Within 3 Days)  Patient/caregiver understands and will follow disposition?: Yes  Reason for Triage: Severe pain on both feet, dry/cracked feet. Pt is diabetic.    Reason for Disposition  [1] MODERATE pain (e.g., interferes with normal activities, limping) AND [2] present > 3 days  Answer Assessment - Initial Assessment Questions Wife not with pt, unsure how long pain has been going on but ask her to make MD appt today  1. ONSET: When did the pain start?      Unknown 2. LOCATION: Where is the pain located?      Both  3. PAIN: How bad is the pain?    (Scale 1-10; or mild, moderate, severe)     moderate 4. WORK OR EXERCISE: Has there been any recent work or exercise that involved this part of the body?      no 5. CAUSE: What do you think is causing the leg pain?     unknown 6. OTHER SYMPTOMS: Do you have any other symptoms? (e.g., chest pain, back pain, breathing difficulty, swelling, rash, fever, numbness, weakness)     Dry cracked feet, denies open areas.  Protocols used: Leg Pain-A-AH

## 2024-09-09 ENCOUNTER — Encounter: Payer: Self-pay | Admitting: Family Medicine

## 2024-09-09 ENCOUNTER — Ambulatory Visit: Admitting: Family Medicine

## 2024-09-09 VITALS — BP 122/75 | HR 73 | Resp 16 | Ht 66.0 in | Wt 190.0 lb

## 2024-09-09 DIAGNOSIS — M7662 Achilles tendinitis, left leg: Secondary | ICD-10-CM | POA: Diagnosis not present

## 2024-09-09 DIAGNOSIS — M79671 Pain in right foot: Secondary | ICD-10-CM | POA: Diagnosis not present

## 2024-09-09 MED ORDER — NAPROXEN 500 MG PO TABS: 500.0000 mg | ORAL_TABLET | Freq: Two times a day (BID) | ORAL | 0 refills | Status: AC

## 2024-09-10 LAB — COMPREHENSIVE METABOLIC PANEL WITH GFR
ALT: 22 [IU]/L (ref 0–44)
AST: 20 [IU]/L (ref 0–40)
Albumin: 4 g/dL (ref 3.8–4.8)
Alkaline Phosphatase: 103 [IU]/L (ref 47–123)
BUN/Creatinine Ratio: 17 (ref 10–24)
BUN: 16 mg/dL (ref 8–27)
Bilirubin Total: 1 mg/dL (ref 0.0–1.2)
CO2: 25 mmol/L (ref 20–29)
Calcium: 9.3 mg/dL (ref 8.6–10.2)
Chloride: 102 mmol/L (ref 96–106)
Creatinine, Ser: 0.93 mg/dL (ref 0.76–1.27)
Globulin, Total: 2 g/dL (ref 1.5–4.5)
Glucose: 115 mg/dL — ABNORMAL HIGH (ref 70–99)
Potassium: 4.9 mmol/L (ref 3.5–5.2)
Sodium: 138 mmol/L (ref 134–144)
Total Protein: 6 g/dL (ref 6.0–8.5)
eGFR: 86 mL/min/{1.73_m2}

## 2024-09-10 LAB — CBC
Hematocrit: 41.1 % (ref 37.5–51.0)
Hemoglobin: 13.4 g/dL (ref 13.0–17.7)
MCH: 26.4 pg — ABNORMAL LOW (ref 26.6–33.0)
MCHC: 32.6 g/dL (ref 31.5–35.7)
MCV: 81 fL (ref 79–97)
Platelets: 228 10*3/uL (ref 150–450)
RBC: 5.08 x10E6/uL (ref 4.14–5.80)
RDW: 15.9 % — ABNORMAL HIGH (ref 11.6–15.4)
WBC: 14.1 10*3/uL — ABNORMAL HIGH (ref 3.4–10.8)

## 2024-09-10 LAB — LIPID PANEL
Chol/HDL Ratio: 4.6 ratio (ref 0.0–5.0)
Cholesterol, Total: 143 mg/dL (ref 100–199)
HDL: 31 mg/dL — ABNORMAL LOW
LDL Chol Calc (NIH): 72 mg/dL (ref 0–99)
Triglycerides: 243 mg/dL — ABNORMAL HIGH (ref 0–149)
VLDL Cholesterol Cal: 40 mg/dL (ref 5–40)

## 2024-09-10 LAB — PSA TOTAL (REFLEX TO FREE): Prostate Specific Ag, Serum: 0.5 ng/mL (ref 0.0–4.0)

## 2024-09-10 LAB — MICROALBUMIN / CREATININE URINE RATIO
Creatinine, Urine: 118.9 mg/dL
Microalb/Creat Ratio: 3 mg/g{creat} (ref 0–29)
Microalbumin, Urine: 3.9 ug/mL

## 2024-09-10 LAB — HEMOGLOBIN A1C
Est. average glucose Bld gHb Est-mCnc: 166 mg/dL
Hgb A1c MFr Bld: 7.4 % — ABNORMAL HIGH (ref 4.8–5.6)

## 2024-12-24 ENCOUNTER — Ambulatory Visit: Admitting: Family Medicine

## 2025-02-17 ENCOUNTER — Ambulatory Visit

## 2025-09-07 ENCOUNTER — Ambulatory Visit: Admitting: Dermatology
